# Patient Record
Sex: Female | Born: 1970 | Race: White | Hispanic: No | Marital: Married | Smoking: Current every day smoker
Health system: Southern US, Community
[De-identification: ages and names within clinical notes are randomized; demographics above are authoritative.]

## PROBLEM LIST (undated history)

## (undated) DIAGNOSIS — I10 Essential (primary) hypertension: Secondary | ICD-10-CM

## (undated) DIAGNOSIS — F988 Other specified behavioral and emotional disorders with onset usually occurring in childhood and adolescence: Secondary | ICD-10-CM

## (undated) DIAGNOSIS — E785 Hyperlipidemia, unspecified: Secondary | ICD-10-CM

## (undated) DIAGNOSIS — N289 Disorder of kidney and ureter, unspecified: Secondary | ICD-10-CM

## (undated) DIAGNOSIS — F419 Anxiety disorder, unspecified: Secondary | ICD-10-CM

## (undated) DIAGNOSIS — J302 Other seasonal allergic rhinitis: Secondary | ICD-10-CM

## (undated) DIAGNOSIS — J40 Bronchitis, not specified as acute or chronic: Secondary | ICD-10-CM

## (undated) DIAGNOSIS — N200 Calculus of kidney: Secondary | ICD-10-CM

## (undated) DIAGNOSIS — K760 Fatty (change of) liver, not elsewhere classified: Secondary | ICD-10-CM

## (undated) DIAGNOSIS — Z862 Personal history of diseases of the blood and blood-forming organs and certain disorders involving the immune mechanism: Secondary | ICD-10-CM

## (undated) DIAGNOSIS — R7303 Prediabetes: Secondary | ICD-10-CM

## (undated) DIAGNOSIS — F329 Major depressive disorder, single episode, unspecified: Secondary | ICD-10-CM

## (undated) HISTORY — DX: Other specified behavioral and emotional disorders with onset usually occurring in childhood and adolescence: F98.8

## (undated) HISTORY — DX: Other seasonal allergic rhinitis: J30.2

## (undated) HISTORY — DX: Hyperlipidemia, unspecified: E78.5

## (undated) HISTORY — DX: Prediabetes: R73.03

## (undated) HISTORY — DX: Major depressive disorder, single episode, unspecified: F32.9

## (undated) HISTORY — PX: NASAL SINUS SURGERY: SHX719

## (undated) HISTORY — PX: TUBAL LIGATION: SHX77

## (undated) HISTORY — PX: INGUINAL HERNIA REPAIR: SUR1180

## (undated) HISTORY — DX: Personal history of diseases of the blood and blood-forming organs and certain disorders involving the immune mechanism: Z86.2

## (undated) HISTORY — PX: APPENDECTOMY: SHX54

## (undated) HISTORY — PX: OTHER SURGICAL HISTORY: SHX169

---
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
## 2012-02-29 LAB — CBC AND DIFFERENTIAL
Hemoglobin: 15 g/dL (ref 12.0–16.0)
Platelets: 302 10*3/uL (ref 150–399)
WBC: 8.5 10^3/mL

## 2012-02-29 LAB — LIPID PANEL
Cholesterol: 167 mg/dL (ref 0–200)
LDL Cholesterol: 92 mg/dL
Triglycerides: 232 mg/dL — AB (ref 40–160)

## 2012-02-29 LAB — BASIC METABOLIC PANEL: Creatinine: 0.9 mg/dL (ref 0.5–1.1)

## 2012-06-14 ENCOUNTER — Ambulatory Visit (INDEPENDENT_AMBULATORY_CARE_PROVIDER_SITE_OTHER): Payer: 59 | Admitting: Family Medicine

## 2012-06-14 ENCOUNTER — Encounter: Payer: Self-pay | Admitting: Family Medicine

## 2012-06-14 VITALS — BP 132/75 | HR 82 | Ht 66.0 in | Wt 198.0 lb

## 2012-06-14 DIAGNOSIS — F988 Other specified behavioral and emotional disorders with onset usually occurring in childhood and adolescence: Secondary | ICD-10-CM

## 2012-06-14 DIAGNOSIS — F32A Depression, unspecified: Secondary | ICD-10-CM

## 2012-06-14 DIAGNOSIS — F329 Major depressive disorder, single episode, unspecified: Secondary | ICD-10-CM

## 2012-06-14 DIAGNOSIS — R7309 Other abnormal glucose: Secondary | ICD-10-CM

## 2012-06-14 DIAGNOSIS — R7303 Prediabetes: Secondary | ICD-10-CM | POA: Insufficient documentation

## 2012-06-14 DIAGNOSIS — J302 Other seasonal allergic rhinitis: Secondary | ICD-10-CM

## 2012-06-14 DIAGNOSIS — E785 Hyperlipidemia, unspecified: Secondary | ICD-10-CM | POA: Insufficient documentation

## 2012-06-14 DIAGNOSIS — Z862 Personal history of diseases of the blood and blood-forming organs and certain disorders involving the immune mechanism: Secondary | ICD-10-CM

## 2012-06-14 DIAGNOSIS — J309 Allergic rhinitis, unspecified: Secondary | ICD-10-CM

## 2012-06-14 HISTORY — DX: Other seasonal allergic rhinitis: J30.2

## 2012-06-14 HISTORY — DX: Hyperlipidemia, unspecified: E78.5

## 2012-06-14 HISTORY — DX: Personal history of diseases of the blood and blood-forming organs and certain disorders involving the immune mechanism: Z86.2

## 2012-06-14 HISTORY — DX: Other specified behavioral and emotional disorders with onset usually occurring in childhood and adolescence: F98.8

## 2012-06-14 HISTORY — DX: Depression, unspecified: F32.A

## 2012-06-14 MED ORDER — ESCITALOPRAM OXALATE 20 MG PO TABS: 20.0000 mg | ORAL_TABLET | Freq: Every day | ORAL | Status: DC

## 2012-06-14 MED ORDER — AMPHETAMINE-DEXTROAMPHETAMINE 10 MG PO TABS: 10.0000 mg | ORAL_TABLET | Freq: Two times a day (BID) | ORAL | Status: DC

## 2012-06-14 MED ORDER — CETIRIZINE HCL 10 MG PO TABS: 10.0000 mg | ORAL_TABLET | Freq: Every day | ORAL | Status: DC

## 2012-06-14 MED ORDER — ATORVASTATIN CALCIUM 40 MG PO TABS: 40.0000 mg | ORAL_TABLET | Freq: Every day | ORAL | Status: DC

## 2012-06-14 NOTE — Progress Notes (Signed)
CC: Sarah Nicholson is a 42 y.o. female is here for Establish Care, Medication Management and check A1c   Subjective: HPI:  Colonoscopy 2011 during leukocytosis episode.  Mammo normal 08/2011, papsmear normal 08/2011.  Pleasant 42 year old here to establish care  History hyperlipidemia: Requesting refills on Lipitor. States she's been on this for years and believes her cholesterol has been well controlled, last cholesterol check December 2013. Denies right upper quadrant pain, skin or scleral discoloration, myalgias, or limb claudication.  History prediabetes: Last summer an A1c was reportedly 6.1.  She admits to drinking large amounts of soda daily basis. Not much exercise since the winter.  Denies polyphagia polydipsia nor polyuria. Denies poorly healing wounds.  History of ADD: Reports being diagnosed by her psychiatrist in 36 while residing in [place].  Had been on Adderall 10 mg twice a day but stopped at the end of summer when college course work was over.  Has reenrolled with college and having trouble with attention while doing computer classes due to easy distractibility, trouble with task completion, with whom response abilities in school work. Requesting refills  History of depression: Reports being hospitalized for depression decades ago. Since then has been on Lexapro which great results but decided to take a drug holiday at the beginning of winter.  Since then she's slowly had a recurrence of periods of sadness and subjective depression with fleeting thoughts of wondering what would like to cut her wrists. She is adamant that she would never act on harming herself or others. No prior suicide attempts. She reports symptoms are improved with the support of her husband, worsened by frustrations with inability to complete course work.  Symptoms currently affecting quality of life.  Patient reports history of seasonal allergies: These were completely relieved when moving here from  [place] last summer. They're worsened with blooming trees locally over the past weeks. She is currently taking Zyrtec over-the-counter which helps, requesting formal prescription. Runny nose itchy eyes.  History of leukocytosis: She reports a white count of 32 and 2011. She reports a hematologist with a negative workup. Since then she denies swollen lymph nodes, trouble bleeding, night sweats, nor unintentional weight loss    Review Of Systems Outlined In HPI  Past Medical History  Diagnosis Date  . Prediabetes 06/14/2012  . Hyperlipidemia 06/14/2012  . Seasonal allergies 06/14/2012  . History of leukocytosis 06/14/2012    Reports "WBC 32" decades ago with negative hematology specialist workup.   . Depression 06/14/2012  . ADD (attention deficit disorder) 06/14/2012     Family History  Problem Relation Age of Onset  . Depression Mother   . Diabetes Father   . Hyperlipidemia Father   . Hypertension Father   . Depression Sister   . Depression Son   . Breast cancer Maternal Grandmother      History  Substance Use Topics  . Smoking status: Current Every Day Smoker  . Smokeless tobacco: Not on file  . Alcohol Use: Not on file     Objective: Filed Vitals:   06/14/12 1014  BP: 132/75  Pulse: 82    General: Alert and Oriented, No Acute Distress HEENT: Pupils equal, round, reactive to light. Conjunctivae clear.  External ears unremarkable, canals clear with intact TMs with appropriate landmarks.  Middle ear appears open without effusion. Pink inferior turbinates.  Moist mucous membranes, pharynx without inflammation nor lesions.  Neck supple without palpable lymphadenopathy nor abnormal masses. Lungs: Clear to auscultation bilaterally, no wheezing/ronchi/rales.  Comfortable work of breathing.  Good air movement. Cardiac: Regular rate and rhythm. Normal S1/S2.  No murmurs, rubs, nor gallops.   Abdomen: Obese soft nontender Extremities: No peripheral edema.  Strong peripheral pulses.  Mental  Status: No depression, anxiety, nor agitation. Pleasant and normal thought process Skin: Warm and dry.  Assessment & Plan: Chantille was seen today for establish care, medication management and check a1c.  Diagnoses and associated orders for this visit:  Prediabetes - POCT HgB A1C  Hyperlipidemia - atorvastatin (LIPITOR) 40 MG tablet; Take 1 tablet (40 mg total) by mouth daily.  ADD (attention deficit disorder) - amphetamine-dextroamphetamine (ADDERALL) 10 MG tablet; Take 1 tablet (10 mg total) by mouth 2 (two) times daily.  Depression - escitalopram (LEXAPRO) 20 MG tablet; Take 1 tablet (20 mg total) by mouth daily.  Seasonal allergies - cetirizine (ZYRTEC ALLERGY) 10 MG tablet; Take 1 tablet (10 mg total) by mouth daily.  History of leukocytosis  Other Orders - Discontinue: escitalopram (LEXAPRO) 20 MG tablet; Take 20 mg by mouth daily. - Discontinue: atorvastatin (LIPITOR) 40 MG tablet; Take 40 mg by mouth daily.    Prediabetes: A1c of 6.0 today, controlled, discussed lifestyle, diet, exercise interventions to help avoiding medications near future and health complications Hyperlipidemia: Clinically controlled, continue Lipitor, we'll hold off on lipid panel until outside records are reviewed Depression: Uncontrolled, restart Lexapro, contracted for safety, return 1 month sooner if needed Seasonal allergies: Control, continue Zyrtec, prescription provided History leukocytosis: No strong clinical indication for CBC today as it sounds like this was done back in December in for years old blood lines have been within normal limits per her report, will review outside records once available    Return in about 4 weeks (around 07/12/2012).

## 2012-06-27 ENCOUNTER — Encounter: Payer: Self-pay | Admitting: Family Medicine

## 2012-06-27 DIAGNOSIS — N6320 Unspecified lump in the left breast, unspecified quadrant: Secondary | ICD-10-CM | POA: Insufficient documentation

## 2012-06-27 DIAGNOSIS — F988 Other specified behavioral and emotional disorders with onset usually occurring in childhood and adolescence: Secondary | ICD-10-CM

## 2012-06-27 DIAGNOSIS — R3129 Other microscopic hematuria: Secondary | ICD-10-CM | POA: Insufficient documentation

## 2012-06-27 DIAGNOSIS — E785 Hyperlipidemia, unspecified: Secondary | ICD-10-CM

## 2012-06-28 ENCOUNTER — Encounter: Payer: Self-pay | Admitting: *Deleted

## 2012-07-12 ENCOUNTER — Ambulatory Visit: Payer: 59 | Admitting: Family Medicine

## 2012-07-12 DIAGNOSIS — Z0289 Encounter for other administrative examinations: Secondary | ICD-10-CM

## 2012-08-23 ENCOUNTER — Encounter: Payer: Self-pay | Admitting: Family Medicine

## 2012-08-23 ENCOUNTER — Ambulatory Visit (INDEPENDENT_AMBULATORY_CARE_PROVIDER_SITE_OTHER): Payer: 59 | Admitting: Family Medicine

## 2012-08-23 VITALS — BP 116/80 | HR 72 | Wt 196.0 lb

## 2012-08-23 DIAGNOSIS — F988 Other specified behavioral and emotional disorders with onset usually occurring in childhood and adolescence: Secondary | ICD-10-CM

## 2012-08-23 DIAGNOSIS — R1012 Left upper quadrant pain: Secondary | ICD-10-CM

## 2012-08-23 DIAGNOSIS — F329 Major depressive disorder, single episode, unspecified: Secondary | ICD-10-CM

## 2012-08-23 DIAGNOSIS — N63 Unspecified lump in unspecified breast: Secondary | ICD-10-CM

## 2012-08-23 DIAGNOSIS — N6320 Unspecified lump in the left breast, unspecified quadrant: Secondary | ICD-10-CM

## 2012-08-23 LAB — CBC WITH DIFFERENTIAL/PLATELET
Hemoglobin: 14.2 g/dL (ref 12.0–15.0)
Lymphs Abs: 3 10*3/uL (ref 0.7–4.0)
Monocytes Relative: 7 % (ref 3–12)
Neutro Abs: 7.5 10*3/uL (ref 1.7–7.7)
Neutrophils Relative %: 65 % (ref 43–77)
Platelets: 325 10*3/uL (ref 150–400)
RBC: 4.87 MIL/uL (ref 3.87–5.11)
WBC: 11.6 10*3/uL — ABNORMAL HIGH (ref 4.0–10.5)

## 2012-08-23 MED ORDER — SACCHAROMYCES BOULARDII 250 MG PO CAPS: 250.0000 mg | ORAL_CAPSULE | Freq: Two times a day (BID) | ORAL | Status: DC

## 2012-08-23 MED ORDER — AMPHETAMINE-DEXTROAMPHETAMINE 10 MG PO TABS: 10.0000 mg | ORAL_TABLET | Freq: Two times a day (BID) | ORAL | Status: DC

## 2012-08-23 NOTE — Progress Notes (Signed)
CC: Sarah Nicholson is a 42 y.o. female is here for ADHD and GI Problem   Subjective: HPI:  Followup ADHD: Patient has been taking Adderall 10 mg one to 2 times a day for the past month noting significant improvement in completion of course work, computer studies. She denies insomnia, irregular heartbeat, chest pain, paranoia, anxiety, unintentional weight loss. She is Quite happy with her current symptom control  Followup depression: She restarted Lexapro at her last visit. She's quite happy with her response. She notes great improvement with thoughts of negativity no longer being present. She denies unintentional weight loss or gain, trouble sleeping, loss in all hobbies, relationship issues, nor thoughts of wanting to harm herself  One year ago she had a mammogram showing a benign mass and was recommended to continue with annual screening she is yet to have this followup mammogram yet she currently denies any rest architectural changes, nipple discharge, nor palpable lumps  She complains today of 2 weeks of left upper quadrant pain it feels similar to when she had a leukocytosis years ago. In the past it was significantly improved with probiotics, she is requesting a prescription of this today.. she denies early satiety, appetite loss, nausea, vomiting. Pain is described as a burning with belching mild in severity nonradiating improves with belching, nothing else makes better or worse. does not seem to be influenced by type of food or meal times. Denies fevers, chills, swollen lymph nodes, easy filling diarrhea nor constipation     Review Of Systems Outlined In HPI  Past Medical History  Diagnosis Date  . Prediabetes 06/14/2012  . Hyperlipidemia 06/14/2012  . Seasonal allergies 06/14/2012  . History of leukocytosis 06/14/2012    Reports "WBC 32" decades ago with negative hematology specialist workup.   . Depression 06/14/2012  . ADD (attention deficit disorder) 06/14/2012     Family History   Problem Relation Age of Onset  . Depression Mother   . Diabetes Father   . Hyperlipidemia Father   . Hypertension Father   . Depression Sister   . Depression Son   . Breast cancer Maternal Grandmother      History  Substance Use Topics  . Smoking status: Current Every Day Smoker  . Smokeless tobacco: Not on file  . Alcohol Use: Not on file     Objective: Filed Vitals:   08/23/12 0924  BP: 116/80  Pulse: 72    General: Alert and Oriented, No Acute Distress HEENT: Pupils equal, round, reactive to light. Conjunctivae clear.  External ears unremarkable, canals clear with intact TMs with appropriate landmarks.  Middle ear appears open without effusion. Pink inferior turbinates.  Moist mucous membranes, pharynx without inflammation nor lesions.  Neck supple without palpable lymphadenopathy nor abnormal masses. Lungs: Clear to auscultation bilaterally, no wheezing/ronchi/rales.  Comfortable work of breathing. Good air movement. Cardiac: Regular rate and rhythm. Normal S1/S2.  No murmurs, rubs, nor gallops.   Abdomen:  obese soft nontender to palpation with normal bowel sounds no palpable masses, no palpable splenomegaly  Extremities: No peripheral edema.  Strong peripheral pulses.  Mental Status: No depression, anxiety, nor agitation. Skin: Warm and dry.  Assessment & Plan: Sarah Nicholson was seen today for adhd and gi problem.  Diagnoses and associated orders for this visit:  ADD (attention deficit disorder) - Discontinue: amphetamine-dextroamphetamine (ADDERALL) 10 MG tablet; Take 1 tablet (10 mg total) by mouth 2 (two) times daily. - Discontinue: amphetamine-dextroamphetamine (ADDERALL) 10 MG tablet; Take 1 tablet (10 mg total) by mouth  2 (two) times daily. May fill on/after July 12th 2014 - amphetamine-dextroamphetamine (ADDERALL) 10 MG tablet; Take 1 tablet (10 mg total) by mouth 2 (two) times daily. May fill on/after August 12th 2014  Left breast mass - MM Digital Screening;  Future  LUQ pain - CBC with Differential - H. pylori antibody, IgG - saccharomyces boulardii (FLORASTOR) 250 MG capsule; Take 1 capsule (250 mg total) by mouth 2 (two) times daily.  Depression    ADD: Chronic well controlled condition, continue as needed Adderall Depression: Chronic well controlled condition, continue Lexapro Left breast mass: Due for routine screening mammogram placed today Left upper quadrant pain: Checking CBC for surveillance of leukocytosis in the past, rule out H. Pylori she's never been treated for this in the past, start florastor given significant benefit of similar pain in the past  Return in about 3 months (around 11/23/2012). or sooner if symptoms persist greater than one week

## 2012-08-24 LAB — H. PYLORI ANTIBODY, IGG: H Pylori IgG: <0.4 {ISR}

## 2012-09-10 ENCOUNTER — Ambulatory Visit (INDEPENDENT_AMBULATORY_CARE_PROVIDER_SITE_OTHER): Payer: 59 | Admitting: Family Medicine

## 2012-09-10 ENCOUNTER — Encounter: Payer: Self-pay | Admitting: Family Medicine

## 2012-09-10 VITALS — BP 137/88 | HR 72 | Temp 98.1°F | Wt 199.0 lb

## 2012-09-10 DIAGNOSIS — R300 Dysuria: Secondary | ICD-10-CM

## 2012-09-10 LAB — POCT URINALYSIS DIPSTICK
Protein, UA: NEGATIVE
Spec Grav, UA: <=1.005
Urobilinogen, UA: 0.2

## 2012-09-10 MED ORDER — CIPROFLOXACIN HCL 500 MG PO TABS: ORAL_TABLET | ORAL | Status: AC

## 2012-09-10 NOTE — Progress Notes (Signed)
CC: Sarah Nicholson is a 42 y.o. female is here for Dysuria   Subjective: HPI:  Patient complains of 3 days of dysuria described as moderate in severity, has been worsening gradually since onset on Friday. Over the week and associated with fevers and chills. Worse with urinating, slightly improved with increased hydration nothing else makes better or worse. Over the week and has been accompanied by mild low back pain nonradiating nothing makes better or worse other than slight improvement with ibuprofen. Dysuria is localized in the pelvis nonradiating with a burning sensation. She denies vaginal discharge, current menses, nor genitourinary complaints otherwise. Denies nausea, vomiting, abdominal pain, nor chest pain   Review Of Systems Outlined In HPI  Past Medical History  Diagnosis Date  . Prediabetes 06/14/2012  . Hyperlipidemia 06/14/2012  . Seasonal allergies 06/14/2012  . History of leukocytosis 06/14/2012    Reports "WBC 32" decades ago with negative hematology specialist workup.   . Depression 06/14/2012  . ADD (attention deficit disorder) 06/14/2012     Family History  Problem Relation Age of Onset  . Depression Mother   . Diabetes Father   . Hyperlipidemia Father   . Hypertension Father   . Depression Sister   . Depression Son   . Breast cancer Maternal Grandmother      History  Substance Use Topics  . Smoking status: Current Every Day Smoker  . Smokeless tobacco: Not on file  . Alcohol Use: Not on file     Objective: Filed Vitals:   09/10/12 0933  BP: 137/88  Pulse: 72  Temp: 98.1 F (36.7 C)    Vital signs reviewed. General: Alert and Oriented, No Acute Distress HEENT: Pupils equal, round, reactive to light. Conjunctivae clear.  External ears unremarkable.  Moist mucous membranes. Back: Trace left CVA tenderness. Lungs: Clear and comfortable work of breathing, speaking in full sentences without accessory muscle use. Cardiac: Regular rate and rhythm.  Neuro:  CN II-XII grossly intact, gait normal. Extremities: No peripheral edema.  Strong peripheral pulses.  Mental Status: No depression, anxiety, nor agitation. Logical though process. Skin: Warm and dry.  Assessment & Plan: Sarah Nicholson was seen today for dysuria.  Diagnoses and associated orders for this visit:  Dysuria - Urinalysis Dipstick - Urine Culture - ciprofloxacin (CIPRO) 500 MG tablet; Take one by mouth twice a day for five days.    Urinalysis with blood, this has been found in UA in the past however given history of pyelonephritis and symptoms consistent with UTI will start treatment with high-dose Cipro following culture and I've asked her to call before we closed tomorrow if symptoms are worsening.  Return if symptoms worsen or fail to improve.

## 2012-11-27 ENCOUNTER — Encounter: Payer: Self-pay | Admitting: *Deleted

## 2013-01-21 ENCOUNTER — Encounter: Payer: Self-pay | Admitting: Physician Assistant

## 2013-01-21 ENCOUNTER — Ambulatory Visit (INDEPENDENT_AMBULATORY_CARE_PROVIDER_SITE_OTHER): Payer: 59

## 2013-01-21 ENCOUNTER — Ambulatory Visit (INDEPENDENT_AMBULATORY_CARE_PROVIDER_SITE_OTHER): Payer: 59 | Admitting: Physician Assistant

## 2013-01-21 VITALS — BP 127/77 | HR 69 | Wt 201.0 lb

## 2013-01-21 DIAGNOSIS — W540XXA Bitten by dog, initial encounter: Secondary | ICD-10-CM

## 2013-01-21 DIAGNOSIS — S61409A Unspecified open wound of unspecified hand, initial encounter: Secondary | ICD-10-CM

## 2013-01-21 DIAGNOSIS — Z23 Encounter for immunization: Secondary | ICD-10-CM

## 2013-01-21 DIAGNOSIS — T148XXA Other injury of unspecified body region, initial encounter: Secondary | ICD-10-CM

## 2013-01-21 MED ORDER — TRAMADOL HCL 50 MG PO TABS: 50.0000 mg | ORAL_TABLET | Freq: Four times a day (QID) | ORAL | Status: DC | PRN

## 2013-01-21 MED ORDER — AMOXICILLIN-POT CLAVULANATE 875-125 MG PO TABS: 1.0000 | ORAL_TABLET | Freq: Two times a day (BID) | ORAL | Status: DC

## 2013-01-21 NOTE — Progress Notes (Signed)
  Subjective:    Patient ID: Sarah Nicholson, female    DOB: 1970-03-17, 42 y.o.   MRN: [id number]  HPI Patient presents to the clinic with a dog bite to the left hand this morning. 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 She ran cold water over wound and wrapped up and came to doctors office. Nothing else has been tried. Dog is up to date of rabies and all other vaccines. Pt has not had tetanus since 42 years old. She had a lot of swelling in her arm and was told not to get tetanus again. She does tolerate the flu shot. She is having a lot of pain and her index finger is numb.     Review of Systems     Objective:   Physical Exam  Constitutional: She appears well-developed and well-nourished.  Musculoskeletal:  Left hand: ROM decreased due to pain. Hand grip decreased significantly. Numbness throughout index finger. Pain with palpation over 1st Metatarsal and down into dorsum side of hand to the base of thumb.  Skin:  Left hand: 5 superfical puncture wounds. Generally swollen and warm to touch.   2 puncture wounds on left index finger under the MCP on palmar side and dorsum side in between MCP and PCP.  1 puncture wound at base of MCP of 2nd metatarsal dorsum side. 1 puncture wound dorsum side in between MCP and PCP and 1 puncture wound palmar side on 2nd metatarsal in between MCP and PCP.          Assessment & Plan:  Dog bite/puncture wound- using normal saline wounds were irrigated. bactroban placed on 5 puncture wounds. Gauze were placed over bite and compressed with ace bandage.   Upon discussion realized that thimerosal could be the preservative that pt was allergic too. Looked through tdap ingredients and no thiomersal. Tdap was given today pt waited in office 30 minutes for any immediate reactions. No reactions noted. She can take benadryl as preventative.    Pt encouraged to keep wounds clean with  soap and water, bactroban for next 3 days. Encouraged ice and regular ibuprofen for next couple of days. Augmentin sent for 7 days. Xrays gotten for puncture wound and for baseline. Follow up in 7 days if not improving. Index finger numbness could be from swelling f/u if not improving.    Spent 30 minutes with patient and greater than 50 percent of visit spent discussing risk vs benefit of Tdap and wound care.

## 2013-01-21 NOTE — Patient Instructions (Signed)

## 2013-03-11 ENCOUNTER — Emergency Department: Admission: EM | Admit: 2013-03-11 | Discharge: 2013-03-11 | Payer: 59 | Source: Home / Self Care

## 2013-05-24 ENCOUNTER — Encounter: Payer: Self-pay | Admitting: Emergency Medicine

## 2013-05-24 ENCOUNTER — Emergency Department (INDEPENDENT_AMBULATORY_CARE_PROVIDER_SITE_OTHER)
Admission: EM | Admit: 2013-05-24 | Discharge: 2013-05-24 | Disposition: A | Discharge status: Home / Self Care | Payer: 59 | Source: Home / Self Care | Attending: Emergency Medicine | Admitting: Emergency Medicine

## 2013-05-24 DIAGNOSIS — T7840XA Allergy, unspecified, initial encounter: Secondary | ICD-10-CM

## 2013-05-24 MED ORDER — EPINEPHRINE 0.3 MG/0.3ML IJ SOAJ: 0.3000 mg | INTRAMUSCULAR | Status: DC | PRN

## 2013-05-24 MED ORDER — PREDNISONE (PAK) 10 MG PO TABS: ORAL_TABLET | ORAL | Status: DC

## 2013-05-24 MED ORDER — DIPHENHYDRAMINE HCL 50 MG/ML IJ SOLN
25.0000 mg | Freq: Once | INTRAMUSCULAR | Status: AC
  Administered 2013-05-24: 25 mg via INTRAMUSCULAR

## 2013-05-24 MED ORDER — METHYLPREDNISOLONE SODIUM SUCC 125 MG IJ SOLR
125.0000 mg | INTRAMUSCULAR | Status: AC
  Administered 2013-05-24: 125 mg via INTRAMUSCULAR

## 2013-05-24 NOTE — ED Notes (Signed)
Pt c/o SOB, swelling in her hands, and itching x 1515. She reports that she has had reaction in the past to cleaning products and that they waxed the floors at her work last night.

## 2013-05-24 NOTE — ED Provider Notes (Signed)
CSN: [id number]     Arrival date & time 05/24/13  1618 History   First MD Initiated Contact with Patient 05/24/13 1618     Chief Complaint  Patient presents with  . Shortness of Breath  . Pruritis   Pt c/o SOB, swelling in her hands, and itching x 1515. She reports that she has had reaction in the past to cleaning products and that they waxed the floors at her work last night.   Patient is a 43 y.o. female presenting with allergic reaction. The history is provided by the patient.  Allergic Reaction Presenting symptoms: difficulty breathing, difficulty swallowing, itching and rash   Presenting symptoms: no swelling and no wheezing   Severity:  Severe Prior episodes: When exposed to the smell of recently waxed floors. Context: chemicals (Smell of recently waxed floor)   Context: no cosmetics, no dairy/milk products, no eggs, no food allergies, no grass, no insect bite/sting, no medications, no new detergents/soaps and no poison ivy   Context comment:  Smell of recently waxed floor Relieved by:  Nothing Worsened by:  Nothing tried Ineffective treatments: 1 Zyrtec today.   Associated sxs:+ fatigue, mild lightheaded.  No syncope or focal neuro sxs. No nausea, vomiting, chest pain.   Past Medical History  Diagnosis Date  . Prediabetes 06/14/2012  . Hyperlipidemia 06/14/2012  . Seasonal allergies 06/14/2012  . History of leukocytosis 06/14/2012    Reports "WBC 32" decades ago with negative hematology specialist workup.   . Depression 06/14/2012  . ADD (attention deficit disorder) 06/14/2012   Past Surgical History  Procedure Laterality Date  . Appendectomy    . Tubal ligation    . Intrauterine ablation    . Bladder sling/mesh    . Nasal sinus surgery     Family History  Problem Relation Age of Onset  . Depression Mother   . Diabetes Father   . Hyperlipidemia Father   . Hypertension Father   . Depression Sister   . Depression Son   . Breast cancer Maternal Grandmother    History   Substance Use Topics  . Smoking status: Current Every Day Smoker  . Smokeless tobacco: Not on file  . Alcohol Use: Not on file   OB History   Grav Para Term Preterm Abortions TAB SAB Ect Mult Living                 Review of Systems  HENT: Positive for trouble swallowing.   Respiratory: Negative for wheezing.   Cardiovascular: Negative for chest pain and palpitations.  Gastrointestinal: Negative.  Negative for nausea and vomiting.  Skin: Positive for itching and rash.  Neurological: Negative for seizures, syncope and numbness.  All other systems reviewed and are negative.    Allergies  Macrobid and Tetanus toxoids  Home Medications   Current Outpatient Rx  Name  Route  Sig  Dispense  Refill  . amoxicillin-clavulanate (AUGMENTIN) 875-125 MG per tablet   Oral   Take 1 tablet by mouth 2 (two) times daily. For 7 days.   14 tablet   0   . amphetamine-dextroamphetamine (ADDERALL) 10 MG tablet   Oral   Take 1 tablet (10 mg total) by mouth 2 (two) times daily. May fill on/after August 12th 2014   60 tablet   0   . atorvastatin (LIPITOR) 40 MG tablet   Oral   Take 1 tablet (40 mg total) by mouth daily.   90 tablet   1   . cetirizine (ZYRTEC ALLERGY)  10 MG tablet   Oral   Take 1 tablet (10 mg total) by mouth daily.   90 tablet   1   . EPINEPHrine (EPIPEN) 0.3 mg/0.3 mL SOAJ injection   Intramuscular   Inject 0.3 mLs (0.3 mg total) into the muscle as needed. As instructed if needed for severe allergic reaction   1 Device   0   . escitalopram (LEXAPRO) 20 MG tablet   Oral   Take 1 tablet (20 mg total) by mouth daily.   90 tablet   1   . predniSONE (STERAPRED UNI-PAK) 10 MG tablet      Take as directed for 6 days.--Take 6 on day 1, 5 on day 2, 4 on day 3, then 3 tablets on day 4, then 2 tablets on day 5, then 1 on day 6.   21 tablet   0   . traMADol (ULTRAM) 50 MG tablet   Oral   Take 1 tablet (50 mg total) by mouth every 6 (six) hours as needed.   20  tablet   0    BP 146/63  Pulse 90  Temp(Src) 98.7 F (37.1 C) (Oral)  Resp 20  SpO2 98% Physical Exam  Constitutional: She is oriented to person, place, and time. She appears well-developed and well-nourished. She appears distressed (Dyspneic, appears very uncomfortable).  Hives  HENT:  Head: Normocephalic and atraumatic.  Nose: Nose normal.  Mouth/Throat: Oropharynx is clear and moist and mucous membranes are normal. No oral lesions. No oropharyngeal exudate or posterior oropharyngeal edema.  Airway intact. No lip swelling  Eyes: Pupils are equal, round, and reactive to light. No scleral icterus.  Neck: Normal range of motion. Neck supple. No JVD present. No tracheal deviation present.  Cardiovascular: Normal rate, regular rhythm and normal heart sounds.  Exam reveals no gallop and no friction rub.   No murmur heard. Pulmonary/Chest: No stridor. She is in respiratory distress. She has wheezes (minimal late expiratory wheezes). She has no rales.  Abdominal: Soft. She exhibits no distension. There is no tenderness.  Musculoskeletal: Normal range of motion. She exhibits no edema and no tenderness.  Lymphadenopathy:    She has no cervical adenopathy.  Neurological: She is alert and oriented to person, place, and time. She displays normal reflexes. No cranial nerve deficit. She exhibits normal muscle tone.  Skin: Skin is warm. She is diaphoretic.  Blanching hives diffusely, especially anterior chest, neck,face  Psychiatric: She has a normal mood and affect. Her behavior is normal.  Mildly anxious    ED Course  Procedures (including critical care time) Labs Review Labs Reviewed - No data to display Imaging Review No results found.  4:20 PM-patient was immediately brought back to exam room and evaluated by physician and nurse. Benadryl 25 mg IM ordered stat  MDM   1. Allergic reaction    At 4:40 PM, I reevaluated patient. She felt significantly better after the Benadryl 25  mg shot. No dyspnea. The rash on chest and neck and face with significantly decreased.The itching almost completely resolved. Lungs: clear , no wheezes. Heart regular rate and rhythm without murmur. Vital signs stable.--Overall, she was significantly improved.  Treatment options discussed, as well as risks, benefits, alternatives. Patient voiced understanding and agreement with the following plans: SoluMedrol 125 mg IM stat. She was observed for another 30 min. And she continued to improved. Vital signs remained stable.  Prescribed prednisone 10 mg-six day dose pack. EpiPen to keep on hand, with instructions and precautions.  At home, take Benadryl 25 mg Q6 hours. Also, Zyrtec daily. Other symptomatic care discussed. Follow-up with your primary care doctor in 5 days, sooner if needed. Advised to discuss with PCP the option of referral to an allergist for full allergy testing. In the meantime, avoid being exposed to smells after recently waxed floors. Note given to excuse from work today and tomorrow.  Precautions discussed. Red flags discussed.--Go to ER if any red flags. Questions invited and answered. Patient voiced understanding and agreement.     Jacqulyn Cane, MD 05/24/13 1901

## 2013-06-25 ENCOUNTER — Ambulatory Visit (INDEPENDENT_AMBULATORY_CARE_PROVIDER_SITE_OTHER): Payer: 59 | Admitting: Family Medicine

## 2013-06-25 ENCOUNTER — Encounter: Payer: Self-pay | Admitting: Family Medicine

## 2013-06-25 VITALS — BP 116/73 | HR 84 | Temp 98.5°F | Wt 205.0 lb

## 2013-06-25 DIAGNOSIS — F329 Major depressive disorder, single episode, unspecified: Secondary | ICD-10-CM

## 2013-06-25 DIAGNOSIS — F3289 Other specified depressive episodes: Secondary | ICD-10-CM

## 2013-06-25 DIAGNOSIS — M7712 Lateral epicondylitis, left elbow: Secondary | ICD-10-CM | POA: Insufficient documentation

## 2013-06-25 DIAGNOSIS — N83209 Unspecified ovarian cyst, unspecified side: Secondary | ICD-10-CM | POA: Insufficient documentation

## 2013-06-25 DIAGNOSIS — F32A Depression, unspecified: Secondary | ICD-10-CM

## 2013-06-25 DIAGNOSIS — Z9109 Other allergy status, other than to drugs and biological substances: Secondary | ICD-10-CM

## 2013-06-25 DIAGNOSIS — E785 Hyperlipidemia, unspecified: Secondary | ICD-10-CM

## 2013-06-25 DIAGNOSIS — M7710 Lateral epicondylitis, unspecified elbow: Secondary | ICD-10-CM

## 2013-06-25 DIAGNOSIS — F988 Other specified behavioral and emotional disorders with onset usually occurring in childhood and adolescence: Secondary | ICD-10-CM

## 2013-06-25 MED ORDER — ESCITALOPRAM OXALATE 20 MG PO TABS: 20.0000 mg | ORAL_TABLET | Freq: Every day | ORAL | Status: DC

## 2013-06-25 MED ORDER — AMPHETAMINE-DEXTROAMPHETAMINE 10 MG PO TABS: 10.0000 mg | ORAL_TABLET | Freq: Two times a day (BID) | ORAL | Status: DC

## 2013-06-25 MED ORDER — ATORVASTATIN CALCIUM 40 MG PO TABS: 40.0000 mg | ORAL_TABLET | Freq: Every day | ORAL | Status: DC

## 2013-06-25 NOTE — Progress Notes (Signed)
CC: Sarah Nicholson is a 43 y.o. female is here for left elbow pain and wants to get back on medication   Subjective: HPI:  Followup hyperlipidemia: Stop taking Lipitor months ago for no particular reason. Denies any intolerance. Denies right upper quadrant pain myalgias nor any known side effects.  Followup depression: Stop taking Lexapro about a month ago stopped without a taper. Since then she reports a she's had lack of motivation with academics. Denies thoughts when harm herself or others however when trying to fall sleep she does think of worst case scenarios.  She has little to look forward to other than birth of her grandson coming up in October. Review of systems positive for unintentional weight gain. She stopped taking medication to see if she still really needed to be on.  Followup ADD: Has not taken Adderall since being at 2015. Denies any intolerance. She's had lack of motivation to come back to request a refill. She's having huge difficulties with completing course work at home. Reports moderate to severe easy distractibility since stopping Adderall. Denies any anxiety, sleep disturbance, nor any other intolerance when taking the medication.  She requested referral to an allergist for allergy testing.  Complains of left elbow pain it has been present since December 2014. Presently daily basis mild to moderate in severity. Nothing particularly better, slightly improves with ibuprofen. Denies swelling, redness, warmth or radiation of pain. Denies recent or remote trauma or overexertion   Review Of Systems Outlined In HPI  Past Medical History  Diagnosis Date  . Prediabetes 06/14/2012  . Hyperlipidemia 06/14/2012  . Seasonal allergies 06/14/2012  . History of leukocytosis 06/14/2012    Reports "WBC 32" decades ago with negative hematology specialist workup.   . Depression 06/14/2012  . ADD (attention deficit disorder) 06/14/2012    Past Surgical History  Procedure Laterality Date   . Appendectomy    . Tubal ligation    . Intrauterine ablation    . Bladder sling/mesh    . Nasal sinus surgery     Family History  Problem Relation Age of Onset  . Depression Mother   . Diabetes Father   . Hyperlipidemia Father   . Hypertension Father   . Depression Sister   . Depression Son   . Breast cancer Maternal Grandmother     History   Social History  . Marital Status: Married    Spouse Name: N/A    Number of Children: N/A  . Years of Education: N/A   Occupational History  . Not on file.   Social History Main Topics  . Smoking status: Current Every Day Smoker  . Smokeless tobacco: Not on file  . Alcohol Use: Not on file  . Drug Use: Not on file  . Sexual Activity: Not on file   Other Topics Concern  . Not on file   Social History Narrative  . No narrative on file     Objective: BP 116/73  Pulse 84  Temp(Src) 98.5 F (36.9 C) (Oral)  Wt 205 lb (92.987 kg)  General: Alert and Oriented, No Acute Distress HEENT: Pupils equal, round, reactive to light. Conjunctivae clear.  Moist mucous membranes pharynx unremarkable Lungs: Clear to auscultation bilaterally, no wheezing/ronchi/rales.  Comfortable work of breathing. Good air movement. Cardiac: Regular rate and rhythm. Normal S1/S2.  No murmurs, rubs, nor gallops.   Extremities: No peripheral edema.  Strong peripheral pulses.  She has full range of motion strength in the left upper extremity, pain is not reproduced  with resisted elbow flexion or extension. Pain is reproduced with resisted left wrist extension and with left wrist supination, pain is mildly reproduced with palpation of left epicondyle. No pain with medial epicondyle or coracoid palpation. No overlying redness warmth or swelling in the left elbow Mental Status: No depression, anxiety, nor agitation. Skin: Warm and dry.  Assessment & Plan: Sarah Nicholson was seen today for left elbow pain and wants to get back on medication.  Diagnoses and associated  orders for this visit:  ADD (attention deficit disorder) - amphetamine-dextroamphetamine (ADDERALL) 10 MG tablet; Take 1 tablet (10 mg total) by mouth 2 (two) times daily.  Depression - escitalopram (LEXAPRO) 20 MG tablet; Take 1 tablet (20 mg total) by mouth daily.  Hyperlipidemia - atorvastatin (LIPITOR) 40 MG tablet; Take 1 tablet (40 mg total) by mouth daily.  Multiple environmental allergies - Ambulatory referral to Allergy  Left tennis elbow    ADD: Uncontrolled restart Adderall Depression: Uncontrolled restart Lexapro Hyperlipidemia: Restart Lipitor with recheck of lipid panel in 2-3 months. Left tennis elbow: Discuss continuation of nonsteroidal anti-inflammatories, ibuprofen 800 mg 3 times a day as needed for pain. She was given a handout on home stretching and rehabilitation exercises she can do on a daily basis the next 2-3 weeks. If not improving after this time we will refer to Dr. Darene Lamer. in sports medicine for further evaluation and treatment.  Return in about 3 months (around 09/24/2013).

## 2013-08-26 ENCOUNTER — Encounter: Payer: Self-pay | Admitting: Family Medicine

## 2013-08-26 ENCOUNTER — Ambulatory Visit (INDEPENDENT_AMBULATORY_CARE_PROVIDER_SITE_OTHER): Payer: 59 | Admitting: Family Medicine

## 2013-08-26 VITALS — BP 124/88 | HR 29 | Ht 66.0 in | Wt 200.0 lb

## 2013-08-26 DIAGNOSIS — Z1239 Encounter for other screening for malignant neoplasm of breast: Secondary | ICD-10-CM

## 2013-08-26 DIAGNOSIS — Z5181 Encounter for therapeutic drug level monitoring: Secondary | ICD-10-CM

## 2013-08-26 DIAGNOSIS — Z0000 Encounter for general adult medical examination without abnormal findings: Secondary | ICD-10-CM

## 2013-08-26 DIAGNOSIS — R7309 Other abnormal glucose: Secondary | ICD-10-CM

## 2013-08-26 DIAGNOSIS — Z79899 Other long term (current) drug therapy: Secondary | ICD-10-CM

## 2013-08-26 DIAGNOSIS — M2550 Pain in unspecified joint: Secondary | ICD-10-CM

## 2013-08-26 DIAGNOSIS — R7303 Prediabetes: Secondary | ICD-10-CM

## 2013-08-26 DIAGNOSIS — E785 Hyperlipidemia, unspecified: Secondary | ICD-10-CM

## 2013-08-26 LAB — COMPLETE METABOLIC PANEL WITH GFR
ALK PHOS: 66 U/L (ref 39–117)
ALT: 9 U/L (ref 0–35)
AST: 11 U/L (ref 0–37)
Albumin: 3.9 g/dL (ref 3.5–5.2)
BUN: 12 mg/dL (ref 6–23)
CALCIUM: 9 mg/dL (ref 8.4–10.5)
CO2: 25 mEq/L (ref 19–32)
Chloride: 108 mEq/L (ref 96–112)
Creat: 0.89 mg/dL (ref 0.50–1.10)
GFR, EST NON AFRICAN AMERICAN: 79 mL/min
GFR, Est African American: >89 mL/min
Glucose, Bld: 82 mg/dL (ref 70–99)
POTASSIUM: 4 meq/L (ref 3.5–5.3)
SODIUM: 141 meq/L (ref 135–145)
TOTAL PROTEIN: 6.4 g/dL (ref 6.0–8.3)
Total Bilirubin: 0.3 mg/dL (ref 0.2–1.2)

## 2013-08-26 LAB — LIPID PANEL
CHOL/HDL RATIO: 6.1 ratio
CHOLESTEROL: 195 mg/dL (ref 0–200)
HDL: 32 mg/dL — ABNORMAL LOW (ref >39)
LDL Cholesterol: 120 mg/dL — ABNORMAL HIGH (ref 0–99)
Triglycerides: 215 mg/dL — ABNORMAL HIGH (ref <150)
VLDL: 43 mg/dL — ABNORMAL HIGH (ref 0–40)

## 2013-08-26 LAB — HEMOGLOBIN A1C
HEMOGLOBIN A1C: 6 % — AB (ref <5.7)
Mean Plasma Glucose: 126 mg/dL — ABNORMAL HIGH (ref <117)

## 2013-08-26 NOTE — Progress Notes (Signed)
CC: Sarah Nicholson is a 43 y.o. female is here for Annual Exam   Subjective: HPI:  Colonoscopy:  Done 2013 and normal, for leukocytosis Papsmear: She had this done last year and it was normal with her GYN office no history of abnormals Mammogram: 2013 benign mass in left breast recs routine screening. Family history of breast cancer in her grandmother and her grandmother siblings. We discussed risks and benefits of screening in her age group and she understandably would like to have a screening mammogram  Influenza Vaccine: No current indication Pneumovax: She believes she had this back in 2012 with her history of smoking, up-to-date Td/Tdap:  Tdap 2014 Zoster: (Start 45 yo)  States that her mood is much better since restarting Lexapro. Mother was recently diagnosed with mixed connective tissue disease.  The patient states that she has chronic joint pain off and on affecting her hands and shoulders that is mild in severity and painful to a degree where she has not addressed it with physicians in the past.  Review of Systems - General ROS: negative for - chills, fever, night sweats, weight gain or weight loss Ophthalmic ROS: negative for - decreased vision Psychological ROS: negative for - anxiety or depression ENT ROS: negative for - hearing change, nasal congestion, tinnitus or allergies Hematological and Lymphatic ROS: negative for - bleeding problems, bruising or swollen lymph nodes Breast ROS: negative Respiratory ROS: no cough, shortness of breath, or wheezing Cardiovascular ROS: no chest pain or dyspnea on exertion Gastrointestinal ROS: no abdominal pain, change in bowel habits, or black or bloody stools Genito-Urinary ROS: negative for - genital discharge, genital ulcers, incontinence or abnormal bleeding from genitals Musculoskeletal ROS: negative for - joint pain or muscle pain Neurological ROS: negative for - headaches or memory loss Dermatological ROS: negative for lumps,  mole changes, rash and skin lesion changes  Past Medical History  Diagnosis Date  . Prediabetes 06/14/2012  . Hyperlipidemia 06/14/2012  . Seasonal allergies 06/14/2012  . History of leukocytosis 06/14/2012    Reports "WBC 32" decades ago with negative hematology specialist workup.   . Depression 06/14/2012  . ADD (attention deficit disorder) 06/14/2012    Past Surgical History  Procedure Laterality Date  . Appendectomy    . Tubal ligation    . Intrauterine ablation    . Bladder sling/mesh    . Nasal sinus surgery     Family History  Problem Relation Age of Onset  . Depression Mother   . Diabetes Father   . Hyperlipidemia Father   . Hypertension Father   . Depression Sister   . Depression Son   . Breast cancer Maternal Grandmother     History   Social History  . Marital Status: Married    Spouse Name: N/A    Number of Children: N/A  . Years of Education: N/A   Occupational History  . Not on file.   Social History Main Topics  . Smoking status: Current Every Day Smoker  . Smokeless tobacco: Not on file  . Alcohol Use: Not on file  . Drug Use: Not on file  . Sexual Activity: Not on file   Other Topics Concern  . Not on file   Social History Narrative  . No narrative on file     Objective: BP 124/88  Pulse 29  Ht 5\' 6"  (1.676 m)  Wt 200 lb (90.719 kg)  BMI 32.30 kg/m2  General: No Acute Distress HEENT: Atraumatic, normocephalic, conjunctivae normal without scleral icterus.  No nasal discharge, hearing grossly intact, TMs with good landmarks bilaterally with no middle ear abnormalities, posterior pharynx clear without oral lesions. Neck: Supple, trachea midline, no cervical nor supraclavicular adenopathy. Pulmonary: Clear to auscultation bilaterally without wheezing, rhonchi, nor rales. Cardiac: Regular rate and rhythm.  No murmurs, rubs, nor gallops. No peripheral edema.  2+ peripheral pulses bilaterally. Abdomen: Bowel sounds normal.  No masses.  Non-tender  without rebound.  Negative Murphy's sign. GU: Deferred to OB/GYN MSK: Grossly intact, no signs of weakness.  Full strength throughout upper and lower extremities.  Full ROM in upper and lower extremities.  No midline spinal tenderness. Neuro: Gait unremarkable, CN II-XII grossly intact.  C5-C6 Reflex 2/4 Bilaterally, L4 Reflex 2/4 Bilaterally.  Cerebellar function intact. Skin: No rashes. She does have a seborrheic keratosis on the right shoulder Psych: Alert and oriented to person/place/time.  Thought process normal. No anxiety/depression.   Assessment & Plan: [name] was seen today for annual exam.  Diagnoses and associated orders for this visit:  Annual physical exam - TSH  Hyperlipidemia - Lipid panel  Prediabetes - COMPLETE METABOLIC PANEL WITH GFR - Hemoglobin A1c  Encounter for monitoring statin therapy - Lipid panel - Hemoglobin A1c  Joint pain - Antinuclear Antib (ANA)  Breast cancer screening - MM DIGITAL SCREENING BILATERAL; Future    Healthy lifestyle interventions including but not limited to regular exercise, a healthy low fat diet, moderation of salt intake, the dangers of tobacco/alcohol/recreational drug use, nutrition supplementation, and accident avoidance were discussed with the patient and a handout was provided for future reference. Prediabetes: Checking fasting blood sugar and A1c today checking thyroid function Hyperlipidemia: Due for repeat lipid panel and liver enzymes Joint pain: Checking ANA given family history of autoimmune issues    Return in about 6 months (around 02/25/2014) for Mood and Cholesterol FU.

## 2013-08-26 NOTE — Patient Instructions (Signed)
Dr. Caitriona Sundquist's General Advice Following Your Complete Physical Exam  The Benefits of Regular Exercise: Unless you suffer from an uncontrolled cardiovascular condition, studies strongly suggest that regular exercise and physical activity will add to both the quality and length of your life.  The World Health Organization recommends 150 minutes of moderate intensity aerobic activity every week.  This is best split over 3-4 days a week, and can be as simple as a brisk walk for just over 35 minutes "most days of the week".  This type of exercise has been shown to lower LDL-Cholesterol, lower average blood sugars, lower blood pressure, lower cardiovascular disease risk, improve memory, and increase one's overall sense of wellbeing.  The addition of anaerobic (or "strength training") exercises offers additional benefits including but not limited to increased metabolism, prevention of osteoporosis, and improved overall cholesterol levels.  How Can I Strive For A Low-Fat Diet?: Current guidelines recommend that 25-35 percent of your daily energy (food) intake should come from fats.  One might ask how can this be achieved without having to dissect each meal on a daily basis?  Switch to skim or 1% milk instead of whole milk.  Focus on lean meats such as ground turkey, fresh fish, baked chicken, and lean cuts of beef as your source of dietary protein.  Limit saturated fat consumption to less than 10% of your daily caloric intake.  Limit trans fatty acid consumption primarily by limiting synthetic trans fats such as partially hydrogenated oils (Ex: fried fast foods).  Substitute olive or vegetable oil for solid fats where possible.  Moderation of Salt Intake: Provided you don't carry a diagnosis of congestive heart failure nor renal failure, I recommend a daily allowance of no more than 2300 mg of salt (sodium).  Keeping under this daily goal is associated with a decreased risk of cardiovascular events, creeping  above it can lead to elevated blood pressures and increases your risk of cardiovascular events.  Milligrams (mg) of salt is listed on all nutrition labels, and your daily intake can add up faster than you think.  Most canned and frozen dinners can pack in over half your daily salt allowance in one meal.    Lifestyle Health Risks: Certain lifestyle choices carry specific health risks.  As you may already know, tobacco use has been associated with increasing one's risk of cardiovascular disease, pulmonary disease, numerous cancers, among many other issues.  What you may not know is that there are medications and nicotine replacement strategies that can more than double your chances of successfully quitting.  I would be thrilled to help manage your quitting strategy if you currently use tobacco products.  When it comes to alcohol use, I've yet to find an "ideal" daily allowance.  Provided an individual does not have a medical condition that is exacerbated by alcohol consumption, general guidelines determine "safe drinking" as no more than two standard drinks for a man or no more than one standard drink for a female per day.  However, much debate still exists on whether any amount of alcohol consumption is technically "safe".  My general advice, keep alcohol consumption to a minimum for general health promotion.  If you or others believe that alcohol, tobacco, or recreational drug use is interfering with your life, I would be happy to provide confidential counseling regarding treatment options.  General "Over The Counter" Nutrition Advice: Postmenopausal women should aim for a daily calcium intake of 1200 mg, however a significant portion of this might already be   provided by diets including milk, yogurt, cheese, and other dairy products.  Vitamin D has been shown to help preserve bone density, prevent fatigue, and has even been shown to help reduce falls in the elderly.  Ensuring a daily intake of 800 Units of  Vitamin D is a good place to start to enjoy the above benefits, we can easily check your Vitamin D level to see if you'd potentially benefit from supplementation beyond 800 Units a day.  Folic Acid intake should be of particular concern to women of childbearing age.  Daily consumption of 400-800 mcg of Folic Acid is recommended to minimize the chance of spinal cord defects in a fetus should pregnancy occur.    For many adults, accidents still remain one of the most common culprits when it comes to cause of death.  Some of the simplest but most effective preventitive habits you can adopt include regular seatbelt use, proper helmet use, securing firearms, and regularly testing your smoke and carbon monoxide detectors.  Verl Whitmore B. Jaymian Bogart DO [facility] [address], [address] [place], [place] [address] Phone: [phone]  

## 2013-08-27 ENCOUNTER — Telehealth: Payer: Self-pay | Admitting: Family Medicine

## 2013-08-27 DIAGNOSIS — E785 Hyperlipidemia, unspecified: Secondary | ICD-10-CM

## 2013-08-27 LAB — TSH: TSH: 2.702 u[IU]/mL (ref 0.350–4.500)

## 2013-08-27 LAB — ANA: Anti Nuclear Antibody(ANA): NEGATIVE

## 2013-08-27 MED ORDER — ROSUVASTATIN CALCIUM 20 MG PO TABS: 20.0000 mg | ORAL_TABLET | Freq: Every day | ORAL | Status: DC

## 2013-08-27 NOTE — Telephone Encounter (Signed)
Seth Bake, Will you please let patient know that fasting blood sugar, kidney function , lung function, and thyroid were normal.  3 month average blood sugar remains in the prediabetic range but unchanged from the value last year.  The only abnormality so far was worsening LDL cholesterol.  I'd recommend she switch from generic lipitor to generic zocor which I've sent to her [facility] on file. We'll want to recheck cholesterol in 3 months.  ANA test is still pending.

## 2013-08-27 NOTE — Telephone Encounter (Signed)
Pt.notified

## 2013-09-17 ENCOUNTER — Encounter: Payer: Self-pay | Admitting: Family Medicine

## 2013-09-17 ENCOUNTER — Ambulatory Visit (INDEPENDENT_AMBULATORY_CARE_PROVIDER_SITE_OTHER): Payer: 59 | Admitting: Family Medicine

## 2013-09-17 VITALS — BP 127/86 | HR 89 | Wt 201.0 lb

## 2013-09-17 DIAGNOSIS — S93402A Sprain of unspecified ligament of left ankle, initial encounter: Secondary | ICD-10-CM

## 2013-09-17 DIAGNOSIS — S93409A Sprain of unspecified ligament of unspecified ankle, initial encounter: Secondary | ICD-10-CM

## 2013-09-17 NOTE — Progress Notes (Signed)
CC: Sarah Nicholson is a 43 y.o. female is here for left ankle pain   Subjective: HPI:  Left ankle pain that has been present since yesterday, immediate pain after inversion injury where she lost her balance going down stairs. She was immediately unable to bear weight however within minutes was able to climb back upstairs and get into her house. Pain is described as pain, mild at rest, moderate with weightbearing. She's able to bear weight today in the office. Pain is localized on the lateral ankle anteriorly and medial ankle anteriorly. There has been no swelling or overlying skin changes nor bruising since the injury. Pain has slightly improved since onset with rest, elevation and ice.  Denies shortness of breath, rapid heartbeat, nor joint pain elsewhere.  Review Of Systems Outlined In HPI  Past Medical History  Diagnosis Date  . Prediabetes 06/14/2012  . Hyperlipidemia 06/14/2012  . Seasonal allergies 06/14/2012  . History of leukocytosis 06/14/2012    Reports "WBC 32" decades ago with negative hematology specialist workup.   . Depression 06/14/2012  . ADD (attention deficit disorder) 06/14/2012    Past Surgical History  Procedure Laterality Date  . Appendectomy    . Tubal ligation    . Intrauterine ablation    . Bladder sling/mesh    . Nasal sinus surgery     Family History  Problem Relation Age of Onset  . Depression Mother   . Diabetes Father   . Hyperlipidemia Father   . Hypertension Father   . Depression Sister   . Depression Son   . Breast cancer Maternal Grandmother     History   Social History  . Marital Status: Married    Spouse Name: N/A    Number of Children: N/A  . Years of Education: N/A   Occupational History  . Not on file.   Social History Main Topics  . Smoking status: Current Every Day Smoker  . Smokeless tobacco: Not on file  . Alcohol Use: Not on file  . Drug Use: Not on file  . Sexual Activity: Not on file   Other Topics Concern  . Not on file    Social History Narrative  . No narrative on file     Objective: BP 127/86  Pulse 89  Wt 201 lb (91.173 kg)  Vital signs reviewed. General: Alert and Oriented, No Acute Distress HEENT: Pupils equal, round, reactive to light. Conjunctivae clear.  External ears unremarkable.  Moist mucous membranes. Lungs: Clear and comfortable work of breathing, speaking in full sentences without accessory muscle use. Cardiac: Regular rate and rhythm.  Extremities: No peripheral edema.  Strong peripheral pulses. No swelling or redness in the left ankle. No pain in the navicular or base of the fifth metatarsal. No reproduction of pain with compression of mid shaft tibia and tibia.  Pain is reproduced with palpation of the ATF ligament, for range of motion and strength of the ankle without laxity. Mental Status: No depression, anxiety, nor agitation. Logical though process. Skin: Warm and dry.  Assessment & Plan: Sarah Nicholson was seen today for left ankle pain.  Diagnoses and associated orders for this visit:  Left ankle sprain, initial encounter    Left ankle sprain: Continue rice, I encouraged her to start daily rehabilitative exercises and to wear a air cast splint that she was provided with today. Followup in 1-2 weeks if no improvement of pain.  Start ibuprofen 800 mg every 8 hours.  Return if symptoms worsen or fail to improve.

## 2014-03-29 ENCOUNTER — Emergency Department (INDEPENDENT_AMBULATORY_CARE_PROVIDER_SITE_OTHER)
Admission: EM | Admit: 2014-03-29 | Discharge: 2014-03-29 | Disposition: A | Discharge status: Home / Self Care | Payer: 59 | Source: Home / Self Care | Attending: Family Medicine | Admitting: Family Medicine

## 2014-03-29 DIAGNOSIS — R5383 Other fatigue: Secondary | ICD-10-CM

## 2014-03-29 LAB — COMPLETE METABOLIC PANEL WITH GFR
ALK PHOS: 61 U/L (ref 39–117)
ALT: 9 U/L (ref 0–35)
AST: 11 U/L (ref 0–37)
Albumin: 3.9 g/dL (ref 3.5–5.2)
BILIRUBIN TOTAL: 0.3 mg/dL (ref 0.2–1.2)
BUN: 11 mg/dL (ref 6–23)
CO2: 21 mEq/L (ref 19–32)
CREATININE: 0.79 mg/dL (ref 0.50–1.10)
Calcium: 8.9 mg/dL (ref 8.4–10.5)
Chloride: 105 mEq/L (ref 96–112)
GLUCOSE: 70 mg/dL (ref 70–99)
Potassium: 4 mEq/L (ref 3.5–5.3)
SODIUM: 137 meq/L (ref 135–145)
TOTAL PROTEIN: 7 g/dL (ref 6.0–8.3)

## 2014-03-29 LAB — POCT URINALYSIS DIP (MANUAL ENTRY)
BILIRUBIN UA: NEGATIVE
BILIRUBIN UA: NEGATIVE
Glucose, UA: NEGATIVE
LEUKOCYTES UA: NEGATIVE
Nitrite, UA: NEGATIVE
Protein Ur, POC: NEGATIVE
Spec Grav, UA: <=1.005
Urobilinogen, UA: 0.2
pH, UA: 6

## 2014-03-29 LAB — TSH: TSH: 1.738 u[IU]/mL (ref 0.350–4.500)

## 2014-03-29 LAB — SEDIMENTATION RATE: SED RATE: 12 mm/h (ref 0–22)

## 2014-03-29 NOTE — ED Provider Notes (Signed)
CSN: [id number]     Arrival date & time 03/29/14  1438 History   First MD Initiated Contact with Patient 03/29/14 1506     Chief Complaint  Patient presents with  . Fatigue      HPI Comments: Patient reports that she had a flu-like illness about 3 weeks ago with chills, fatigue, and fever to 102.6.  However, she had no sore throat, nasal congestion, or cough.  1.5 weeks later she developed URI symptoms with mild sore throat, sneezing, sinus congestion, myalgias, and cough.  She now complains of mild persistent sinus congestion. Over the past 2 to 3 days she has been increasingly fatigued.  She has had increased hunger and thirst.  She notes frequent urination but she has been drinking more fluids.  She reports that since 03/07/14 her weight has decreased from 208 pounds to 194 pounds today. She states that she has checked fasting glucose which has ranged 90 to 100. She states that she stopped taking her usual meds (Lexapro, Zyrtec, Adderall, and Crestor) last summer. Patient has had gestational diabetes. Family history of diabetes in her father, and thyroid disorder in her maternal grandmother.  The history is provided by the patient.    Past Medical History  Diagnosis Date  . Prediabetes 06/14/2012  . Hyperlipidemia 06/14/2012  . Seasonal allergies 06/14/2012  . History of leukocytosis 06/14/2012    Reports "WBC 32" decades ago with negative hematology specialist workup.   . Depression 06/14/2012  . ADD (attention deficit disorder) 06/14/2012   Past Surgical History  Procedure Laterality Date  . Appendectomy    . Tubal ligation    . Intrauterine ablation    . Bladder sling/mesh    . Nasal sinus surgery     Family History  Problem Relation Age of Onset  . Depression Mother   . Diabetes Father   . Hyperlipidemia Father   . Hypertension Father   . Depression Sister   . Depression Son   . Breast cancer Maternal Grandmother    History  Substance Use Topics  . Smoking status: Current  Every Day Smoker  . Smokeless tobacco: Not on file  . Alcohol Use: Not on file   OB History    No data available     Review of Systems  + Weight loss No sore throat No cough No pleuritic pain No wheezing No nasal congestion No post-nasal drainage No sinus pain/pressure No itchy/red eyes No earache + dizziness No hemoptysis No SOB No fever/chills No nausea No vomiting No abdominal pain No diarrhea No urinary symptoms No skin rash + fatigue No myalgias No headache Used OTC meds without relief  Allergies  Macrobid and Tetanus toxoids  Home Medications   Prior to Admission medications   Medication Sig Start Date End Date Taking? Authorizing Provider  amphetamine-dextroamphetamine (ADDERALL) 10 MG tablet Take 1 tablet (10 mg total) by mouth 2 (two) times daily. 06/25/13 06/25/14  Marcial Pacas, DO  cetirizine (ZYRTEC ALLERGY) 10 MG tablet Take 1 tablet (10 mg total) by mouth daily. 06/14/12   Sean Hommel, DO  EPINEPHrine (EPIPEN) 0.3 mg/0.3 mL SOAJ injection Inject 0.3 mLs (0.3 mg total) into the muscle as needed. As instructed if needed for severe allergic reaction 05/24/13   Jacqulyn Cane, MD  escitalopram (LEXAPRO) 20 MG tablet Take 1 tablet (20 mg total) by mouth daily. 06/25/13   Sean Hommel, DO  rosuvastatin (CRESTOR) 20 MG tablet Take 1 tablet (20 mg total) by mouth daily. 08/27/13  Sean Hommel, DO   BP 137/86 mmHg  Pulse 72  Temp(Src) 98.2 F (36.8 C) (Oral)  Wt 194 lb (87.998 kg)  SpO2 98% Physical Exam Nursing notes and Vital Signs reviewed. Appearance:  Patient appears stated age, and in no acute distress Eyes:  Pupils are equal, round, and reactive to light and accomodation.  Extraocular movement is intact.  Conjunctivae are not inflamed  Ears:  Canals normal.  Tympanic membranes normal.  Nose:  Mildly congested turbinates.  No sinus tenderness.    Pharynx:  Normal Neck:  Supple.  No adenopathy or thyromegaly. Lungs:  Clear to auscultation.  Breath sounds are  equal.  Heart:  Regular rate and rhythm without murmurs, rubs, or gallops.  Abdomen:  Nontender without masses or hepatosplenomegaly.  Bowel sounds are present.  No CVA or flank tenderness.  Extremities:  No edema.  No calf tenderness Skin:  No rash present.   ED Course  Procedures  None    Labs Reviewed  POCT URINALYSIS DIP (MANUAL ENTRY) - Abnormal; Notable for the following: BLO moderate; otherwise negative  COMPLETE METABOLIC PANEL WITH GFR  TSH  HEMOGLOBIN A1C  SEDIMENTATION RATE  POCT CBC W AUTO DIFF ([facility]):  WBC 13.1; LY 34.6; MO 5.5; GR 59.9; Hgb 14.2; Platelets 295          MDM   1. Other fatigue ? Etiology.  Note mild leukocytosis 13.1.    Screening abs pending:  CMP, TSH, Hgb A1C; Sed rate Rest, maintain adequate fluid intake, and check temperature daily.  If symptoms become significantly worse during the night or over the weekend, proceed to the local emergency room.  Followup with Family Doctor if not improved in four days.    Kandra Nicolas, MD 03/31/14 [phone]

## 2014-03-29 NOTE — Discharge Instructions (Signed)
Rest, maintain adequate fluid intake, and check temperature daily.  If symptoms become significantly worse during the night or over the weekend, proceed to the local emergency room.    Fatigue Fatigue is a feeling of tiredness, lack of energy, lack of motivation, or feeling tired all the time. Having enough rest, good nutrition, and reducing stress will normally reduce fatigue. Consult your caregiver if it persists. The nature of your fatigue will help your caregiver to find out its cause. The treatment is based on the cause.  CAUSES  There are many causes for fatigue. Most of the time, fatigue can be traced to one or more of your habits or routines. Most causes fit into one or more of three general areas. They are: Lifestyle problems  Sleep disturbances.  Overwork.  Physical exertion.  Unhealthy habits.  Poor eating habits or eating disorders.  Alcohol and/or drug use .  Lack of proper nutrition (malnutrition). Psychological problems  Stress and/or anxiety problems.  Depression.  Grief.  Boredom. Medical Problems or Conditions  Anemia.  Pregnancy.  Thyroid gland problems.  Recovery from major surgery.  Continuous pain.  Emphysema or asthma that is not well controlled  Allergic conditions.  Diabetes.  Infections (such as mononucleosis).  Obesity.  Sleep disorders, such as sleep apnea.  Heart failure or other heart-related problems.  Cancer.  Kidney disease.  Liver disease.  Effects of certain medicines such as antihistamines, cough and cold remedies, prescription pain medicines, heart and blood pressure medicines, drugs used for treatment of cancer, and some antidepressants. SYMPTOMS  The symptoms of fatigue include:   Lack of energy.  Lack of drive (motivation).  Drowsiness.  Feeling of indifference to the surroundings. DIAGNOSIS  The details of how you feel help guide your caregiver in finding out what is causing the fatigue. You will be  asked about your present and past health condition. It is important to review all medicines that you take, including prescription and non-prescription items. A thorough exam will be done. You will be questioned about your feelings, habits, and normal lifestyle. Your caregiver may suggest blood tests, urine tests, or other tests to look for common medical causes of fatigue.  TREATMENT  Fatigue is treated by correcting the underlying cause. For example, if you have continuous pain or depression, treating these causes will improve how you feel. Similarly, adjusting the dose of certain medicines will help in reducing fatigue.  HOME CARE INSTRUCTIONS   Try to get the required amount of good sleep every night.  Eat a healthy and nutritious diet, and drink enough water throughout the day.  Practice ways of relaxing (including yoga or meditation).  Exercise regularly.  Make plans to change situations that cause stress. Act on those plans so that stresses decrease over time. Keep your work and personal routine reasonable.  Avoid street drugs and minimize use of alcohol.  Start taking a daily multivitamin after consulting your caregiver. SEEK MEDICAL CARE IF:   You have persistent tiredness, which cannot be accounted for.  You have fever.  You have unintentional weight loss.  You have headaches.  You have disturbed sleep throughout the night.  You are feeling sad.  You have constipation.  You have dry skin.  You have gained weight.  You are taking any new or different medicines that you suspect are causing fatigue.  You are unable to sleep at night.  You develop any unusual swelling of your legs or other parts of your body. [place]  IF:   You are feeling confused.  Your vision is blurred.  You feel faint or pass out.  You develop severe headache.  You develop severe abdominal, pelvic, or back pain.  You develop chest pain, shortness of breath, or an  irregular or fast heartbeat.  You are unable to pass a normal amount of urine.  You develop abnormal bleeding such as bleeding from the rectum or you vomit blood.  You have thoughts about harming yourself or committing suicide.  You are worried that you might harm someone else. MAKE SURE YOU:   Understand these instructions.  Will watch your condition.  Will get help right away if you are not doing well or get worse. Document Released: 12/26/2006 Document Revised: 05/23/2011 Document Reviewed: 07/02/2013 [facility] Patient Information 2015 [place], [place]. This information is not intended to replace advice given to you by your health care provider. Make sure you discuss any questions you have with your health care provider.

## 2014-03-29 NOTE — ED Notes (Signed)
Patient complains of fatigue, states that she just doesn't feel good in general, states he has been very thirsty and has dry mouth lately, she has checked her sugars and fasting are 90-100. She has been having frequent urination but she is also drinking water constantly. She reports that she has taken herself off her medications (Lexapro, Zyrtec,Adderall, Crestor)  but she keeps a Epipen, she states she stopped taking the medications back in the summer

## 2014-03-31 ENCOUNTER — Encounter: Payer: Self-pay | Admitting: Family Medicine

## 2014-03-31 ENCOUNTER — Ambulatory Visit (INDEPENDENT_AMBULATORY_CARE_PROVIDER_SITE_OTHER): Payer: 59 | Admitting: Family Medicine

## 2014-03-31 VITALS — BP 118/75 | HR 83 | Wt 193.0 lb

## 2014-03-31 DIAGNOSIS — R5383 Other fatigue: Secondary | ICD-10-CM

## 2014-03-31 LAB — POCT CBC W AUTO DIFF (K'VILLE URGENT CARE)

## 2014-03-31 NOTE — Progress Notes (Signed)
CC: Sarah Nicholson is a 44 y.o. female is here for Follow-up   Subjective: HPI:  Fatigue, dry mouth, polydipsia that has been present to a mild degree since a viral respiratory illness around Christmas. Symptoms seem to be getting better until a second viral upper respiratory illness about a week ago then symptoms again began to improve only to worsen over the weekend. They're currently moderate to severe in severity. She reports going shopping on Saturday and having to sleep the rest of the day due to exhaustion. She reports that fatigue is described as a weakness all over concentrated at No one particular part of the body. No interventions as of yet other than normal complete metabolic panel, sedimentation rate, urinalysis without glucose in the urine and a CBC with a mildly elevated blood cell count.  She's also been checking her blood sugar at home all fasting blood sugars are less than 100 and the highest blood sugar she's had was 132 hours after a meal.  Denies fevers, chills, shortness of breath, wheezing, joint pain, nor any motor or sensory disturbances other than that above. She denies dry eyes. She denies depression.  Denies any skin changes or gastrointestinal complaints. Symptoms are present all hours of the day. Denies nonrestorative sleep nor snoring.   Review Of Systems Outlined In HPI  Past Medical History  Diagnosis Date  . Prediabetes 06/14/2012  . Hyperlipidemia 06/14/2012  . Seasonal allergies 06/14/2012  . History of leukocytosis 06/14/2012    Reports "WBC 32" decades ago with negative hematology specialist workup.   . Depression 06/14/2012  . ADD (attention deficit disorder) 06/14/2012    Past Surgical History  Procedure Laterality Date  . Appendectomy    . Tubal ligation    . Intrauterine ablation    . Bladder sling/mesh    . Nasal sinus surgery     Family History  Problem Relation Age of Onset  . Depression Mother   . Diabetes Father   . Hyperlipidemia Father   .  Hypertension Father   . Depression Sister   . Depression Son   . Breast cancer Maternal Grandmother     History   Social History  . Marital Status: Married    Spouse Name: N/A    Number of Children: N/A  . Years of Education: N/A   Occupational History  . Not on file.   Social History Main Topics  . Smoking status: Current Every Day Smoker  . Smokeless tobacco: Not on file  . Alcohol Use: Not on file  . Drug Use: Not on file  . Sexual Activity: Not on file   Other Topics Concern  . Not on file   Social History Narrative     Objective: BP 118/75 mmHg  Pulse 83  Wt 193 lb (87.544 kg)  General: Alert and Oriented, No Acute Distress HEENT: Pupils equal, round, reactive to light. Conjunctivae clear.  External ears unremarkable, canals clear with intact TMs with appropriate landmarks.  Middle ear appears open without effusion. Pink inferior turbinates.  Moist mucous membranes, pharynx without inflammation nor lesions.  Neck supple without palpable lymphadenopathy nor abnormal masses. Lungs: Clear to auscultation bilaterally, no wheezing/ronchi/rales.  Comfortable work of breathing. Good air movement. Cardiac: Regular rate and rhythm. Normal S1/S2.  No murmurs, rubs, nor gallops.   Extremities: No peripheral edema.  Strong peripheral pulses.  Mental Status: No depression, anxiety, nor agitation. Skin: Warm and dry.  Assessment & Plan: Janiyha was seen today for follow-up.  Diagnoses and  associated orders for this visit:  Other fatigue - CMV IgM - TSH - Monospot   Ruling out hypothyroidism and viral infections above. Ultimate plan will be based on these results.  Return if symptoms worsen or fail to improve.

## 2014-04-01 LAB — MONONUCLEOSIS SCREEN: Mono Screen: NEGATIVE

## 2014-04-01 LAB — TSH: TSH: 1.371 u[IU]/mL (ref 0.350–4.500)

## 2014-04-02 LAB — HEMOGLOBIN A1C
Hgb A1c MFr Bld: 6 % — ABNORMAL HIGH (ref <5.7)
Mean Plasma Glucose: 126 mg/dL — ABNORMAL HIGH (ref <117)

## 2014-04-02 LAB — CMV IGM

## 2014-06-20 ENCOUNTER — Ambulatory Visit (INDEPENDENT_AMBULATORY_CARE_PROVIDER_SITE_OTHER): Payer: 59 | Admitting: Family Medicine

## 2014-06-20 ENCOUNTER — Encounter: Payer: Self-pay | Admitting: Family Medicine

## 2014-06-20 VITALS — BP 130/79 | HR 76 | Wt 196.0 lb

## 2014-06-20 DIAGNOSIS — F909 Attention-deficit hyperactivity disorder, unspecified type: Secondary | ICD-10-CM | POA: Diagnosis not present

## 2014-06-20 DIAGNOSIS — F988 Other specified behavioral and emotional disorders with onset usually occurring in childhood and adolescence: Secondary | ICD-10-CM

## 2014-06-20 DIAGNOSIS — F17203 Nicotine dependence unspecified, with withdrawal: Secondary | ICD-10-CM

## 2014-06-20 MED ORDER — AMPHETAMINE-DEXTROAMPHETAMINE 10 MG PO TABS
10.0000 mg | ORAL_TABLET | Freq: Two times a day (BID) | ORAL | Status: DC
Start: 2014-06-20 — End: 2014-10-13

## 2014-06-20 MED ORDER — ATENOLOL 50 MG PO TABS: 50.0000 mg | ORAL_TABLET | Freq: Every day | ORAL | Status: DC

## 2014-06-20 NOTE — Progress Notes (Signed)
CC: Sarah Nicholson is a 44 y.o. female is here for Nicotine Dependence   Subjective: HPI:  Last week patient substantially cut down on smoking habit. She is currently smoking a total of 1-2 cigarettes a day now taking about 1-2 puffs a time and then re-lighting the butt later in the day. She is having some help with a nicotine patch at the 21 mg formulation. She still has cravings however it slowly improving with respect to severity and frequency. Her biggest complaint is that she finds that she is extremely jittery now constantly having to move her hands and feels hyperactive. She denies any mental disturbance other than the above specifically no anxiety paranoia nor cognitive impairment other than ADD concerns below.she wants no further something that she can take to take care of her jitteriness.  She's also noticed that since she stopped smoking her distractibility and impulsivity has worsened. She believes that nicotine was helping her with these conditions along with occasionally taking Adderall. She's not been on her Adderall for about a month now and feels like attention and distractibility is getting in the way with job responsibilities.   Review Of Systems Outlined In HPI  Past Medical History  Diagnosis Date  . Prediabetes 06/14/2012  . Hyperlipidemia 06/14/2012  . Seasonal allergies 06/14/2012  . History of leukocytosis 06/14/2012    Reports "WBC 32" decades ago with negative hematology specialist workup.   . Depression 06/14/2012  . ADD (attention deficit disorder) 06/14/2012    Past Surgical History  Procedure Laterality Date  . Appendectomy    . Tubal ligation    . Intrauterine ablation    . Bladder sling/mesh    . Nasal sinus surgery     Family History  Problem Relation Age of Onset  . Depression Mother   . Diabetes Father   . Hyperlipidemia Father   . Hypertension Father   . Depression Sister   . Depression Son   . Breast cancer Maternal Grandmother     History    Social History  . Marital Status: Married    Spouse Name: N/A  . Number of Children: N/A  . Years of Education: N/A   Occupational History  . Not on file.   Social History Main Topics  . Smoking status: Current Every Day Smoker  . Smokeless tobacco: Not on file  . Alcohol Use: Not on file  . Drug Use: Not on file  . Sexual Activity: Not on file   Other Topics Concern  . Not on file   Social History Narrative     Objective: BP 130/79 mmHg  Pulse 76  Wt 196 lb (88.905 kg)  Vital signs reviewed. General: Alert and Oriented, No Acute Distress HEENT: Pupils equal, round, reactive to light. Conjunctivae clear.  External ears unremarkable.  Moist mucous membranes. Lungs: Clear and comfortable work of breathing, speaking in full sentences without accessory muscle use. Cardiac: Regular rate and rhythm.  Neuro: CN II-XII grossly intact, gait normal. Extremities: No peripheral edema.  Strong peripheral pulses.  Mental Status: No depression, anxiety, nor agitation. Logical though process. Skin: Warm and dry.  Assessment & Plan: Breslyn was seen today for nicotine dependence.  Diagnoses and all orders for this visit:  ADD (attention deficit disorder) Orders: -     amphetamine-dextroamphetamine (ADDERALL) 10 MG tablet; Take 1 tablet (10 mg total) by mouth 2 (two) times daily.  Nicotine withdrawal  Other orders -     atenolol (TENORMIN) 50 MG tablet; Take 1 tablet (  50 mg total) by mouth daily.   ADD: Uncontrolled, restart Adderall at former regimen Nicotine withdrawal: She tells me that one of her pharmacist colleagues has suggested a medication that starts with a and ends in -lol. Discussed with her that from 100% sure what she is referring to but it was unreasonable that the beta blocker atenolol would help with some of her jitteriness and hyperactivity. Will start this since it looks like her blood pressure will tolerate it but also discussed cutting the dose in half she  should begin to experience lightheadedness.   Return if symptoms worsen or fail to improve.

## 2014-07-31 DIAGNOSIS — I209 Angina pectoris, unspecified: Secondary | ICD-10-CM | POA: Insufficient documentation

## 2014-07-31 DIAGNOSIS — I959 Hypotension, unspecified: Secondary | ICD-10-CM | POA: Insufficient documentation

## 2014-07-31 DIAGNOSIS — R079 Chest pain, unspecified: Secondary | ICD-10-CM | POA: Insufficient documentation

## 2014-07-31 DIAGNOSIS — R51 Headache: Secondary | ICD-10-CM

## 2014-07-31 DIAGNOSIS — R519 Headache, unspecified: Secondary | ICD-10-CM | POA: Insufficient documentation

## 2014-10-10 ENCOUNTER — Ambulatory Visit: Payer: 59 | Admitting: Family Medicine

## 2014-10-13 ENCOUNTER — Ambulatory Visit (INDEPENDENT_AMBULATORY_CARE_PROVIDER_SITE_OTHER): Payer: 59 | Admitting: Family Medicine

## 2014-10-13 ENCOUNTER — Encounter: Payer: Self-pay | Admitting: Family Medicine

## 2014-10-13 ENCOUNTER — Ambulatory Visit (INDEPENDENT_AMBULATORY_CARE_PROVIDER_SITE_OTHER): Payer: 59

## 2014-10-13 VITALS — BP 107/68 | HR 77 | Wt 207.0 lb

## 2014-10-13 DIAGNOSIS — M25552 Pain in left hip: Secondary | ICD-10-CM | POA: Diagnosis not present

## 2014-10-13 DIAGNOSIS — F988 Other specified behavioral and emotional disorders with onset usually occurring in childhood and adolescence: Secondary | ICD-10-CM

## 2014-10-13 DIAGNOSIS — F329 Major depressive disorder, single episode, unspecified: Secondary | ICD-10-CM

## 2014-10-13 DIAGNOSIS — F32A Depression, unspecified: Secondary | ICD-10-CM

## 2014-10-13 DIAGNOSIS — F909 Attention-deficit hyperactivity disorder, unspecified type: Secondary | ICD-10-CM | POA: Diagnosis not present

## 2014-10-13 MED ORDER — AMPHETAMINE-DEXTROAMPHETAMINE 10 MG PO TABS: 10.0000 mg | ORAL_TABLET | Freq: Two times a day (BID) | ORAL | Status: DC

## 2014-10-13 MED ORDER — ESCITALOPRAM OXALATE 20 MG PO TABS: 20.0000 mg | ORAL_TABLET | Freq: Every day | ORAL | Status: DC

## 2014-10-13 NOTE — Progress Notes (Signed)
CC: Sarah Nicholson is a 44 y.o. female is here for left hip pain   Subjective: HPI:  Left hip pain that has been present for the past 3 months on a daily basis. It's worse with walking, lying on either right or left side. It is slightly improved with taking Aleve or ibuprofen. It's not uncommon for her to wake her up in the middle the night. Pain is moderate in severity and present on a daily basis. She's tried some home exercises that she saw online but these did not help worsen her pain. Pain is localized in the posterior aspect of the hip and radiates somewhat forward but not entirely into the groin. She denies any recent or remote trauma or overexertion. Pain is nonradiating. She denies any motor or sensory disturbances in the left lower extremity. Pain is described only has pain.   Follow-up ADHD: She would like a refill on Adderall. She takes it only on days that she feels like her job responsibilities are round be compromised by her easy distractibility. She denies any known side effects.  Follow-up depression: Symptoms have been somewhat influenced by her son now identified as a woman. She tells me that she's accepted this but it has been a challenge to her mood. Currently taking Lexapro daily requesting a refill. Denies any known side effects. No thoughts 1 or others   Review Of Systems Outlined In HPI  Past Medical History  Diagnosis Date  . Prediabetes 06/14/2012  . Hyperlipidemia 06/14/2012  . Seasonal allergies 06/14/2012  . History of leukocytosis 06/14/2012    Reports "WBC 32" decades ago with negative hematology specialist workup.   . Depression 06/14/2012  . ADD (attention deficit disorder) 06/14/2012    Past Surgical History  Procedure Laterality Date  . Appendectomy    . Tubal ligation    . Intrauterine ablation    . Bladder sling/mesh    . Nasal sinus surgery     Family History  Problem Relation Age of Onset  . Depression Mother   . Diabetes Father   . Hyperlipidemia  Father   . Hypertension Father   . Depression Sister   . Depression Son   . Breast cancer Maternal Grandmother     History   Social History  . Marital Status: Married    Spouse Name: N/A  . Number of Children: N/A  . Years of Education: N/A   Occupational History  . Not on file.   Social History Main Topics  . Smoking status: Current Every Day Smoker  . Smokeless tobacco: Not on file  . Alcohol Use: Not on file  . Drug Use: Not on file  . Sexual Activity: Not on file   Other Topics Concern  . Not on file   Social History Narrative     Objective: BP 107/68 mmHg  Pulse 77  Wt 207 lb (93.895 kg)  Vital signs reviewed. General: Alert and Oriented, No Acute Distress HEENT: Pupils equal, round, reactive to light. Conjunctivae clear.  External ears unremarkable.  Moist mucous membranes. Lungs: Clear and comfortable work of breathing, speaking in full sentences without accessory muscle use. Cardiac: Regular rate and rhythm.  Neuro: CN II-XII grossly intact, gait normal. Extremities: No peripheral edema.  Strong peripheral pulses. Full range of motion and strength of left lower extremity. Engaging her hamstrings does not reproduce any of her pain. Pain is reproduced with log roll of the femur both in internal and external rotation. No pain with standing stationary Mental  Status: No depression, anxiety, nor agitation. Logical though process. Skin: Warm and dry.  Assessment & Plan: Abiola was seen today for left hip pain.  Diagnoses and all orders for this visit:  Left hip pain Orders: -     DG HIP UNILAT WITH PELVIS 2-3 VIEWS LEFT; Future  Depression Orders: -     escitalopram (LEXAPRO) 20 MG tablet; Take 1 tablet (20 mg total) by mouth daily.  ADD (attention deficit disorder) Orders: -     amphetamine-dextroamphetamine (ADDERALL) 10 MG tablet; Take 1 tablet (10 mg total) by mouth 2 (two) times daily.   Left hip pain: Obtain x-ray to look for interarticular  abnormality Depression: Controlled continue Lexapro ADD: Controlled continue Adderall.   Return if symptoms worsen or fail to improve.

## 2014-12-09 ENCOUNTER — Ambulatory Visit (INDEPENDENT_AMBULATORY_CARE_PROVIDER_SITE_OTHER): Payer: 59 | Admitting: Family Medicine

## 2014-12-09 ENCOUNTER — Encounter: Payer: Self-pay | Admitting: Family Medicine

## 2014-12-09 VITALS — BP 125/80 | HR 67 | Ht 66.0 in | Wt 209.0 lb

## 2014-12-09 DIAGNOSIS — M2010 Hallux valgus (acquired), unspecified foot: Secondary | ICD-10-CM

## 2014-12-09 DIAGNOSIS — Z0000 Encounter for general adult medical examination without abnormal findings: Secondary | ICD-10-CM

## 2014-12-09 DIAGNOSIS — F909 Attention-deficit hyperactivity disorder, unspecified type: Secondary | ICD-10-CM | POA: Diagnosis not present

## 2014-12-09 DIAGNOSIS — Z1239 Encounter for other screening for malignant neoplasm of breast: Secondary | ICD-10-CM | POA: Diagnosis not present

## 2014-12-09 DIAGNOSIS — F988 Other specified behavioral and emotional disorders with onset usually occurring in childhood and adolescence: Secondary | ICD-10-CM

## 2014-12-09 LAB — LIPID PANEL
Cholesterol: 193 mg/dL (ref 125–200)
HDL: 30 mg/dL — ABNORMAL LOW (ref >=46)
LDL Cholesterol: 133 mg/dL — ABNORMAL HIGH (ref <130)
Total CHOL/HDL Ratio: 6.4 Ratio — ABNORMAL HIGH (ref <=5.0)
Triglycerides: 151 mg/dL — ABNORMAL HIGH (ref <150)
VLDL: 30 mg/dL (ref <30)

## 2014-12-09 LAB — CBC WITH DIFFERENTIAL/PLATELET
BASOS PCT: 0 % (ref 0–1)
Basophils Absolute: 0 10*3/uL (ref 0.0–0.1)
EOS PCT: 1 % (ref 0–5)
Eosinophils Absolute: 0.1 10*3/uL (ref 0.0–0.7)
HEMATOCRIT: 40.1 % (ref 36.0–46.0)
Hemoglobin: 13.7 g/dL (ref 12.0–15.0)
LYMPHS PCT: 26 % (ref 12–46)
Lymphs Abs: 2.5 10*3/uL (ref 0.7–4.0)
MCH: 30 pg (ref 26.0–34.0)
MCHC: 34.2 g/dL (ref 30.0–36.0)
MCV: 87.7 fL (ref 78.0–100.0)
MONO ABS: 0.7 10*3/uL (ref 0.1–1.0)
MONOS PCT: 7 % (ref 3–12)
MPV: 9.5 fL (ref 8.6–12.4)
NEUTROS ABS: 6.3 10*3/uL (ref 1.7–7.7)
Neutrophils Relative %: 66 % (ref 43–77)
Platelets: 301 10*3/uL (ref 150–400)
RBC: 4.57 MIL/uL (ref 3.87–5.11)
RDW: 13.4 % (ref 11.5–15.5)
WBC: 9.6 10*3/uL (ref 4.0–10.5)

## 2014-12-09 LAB — COMPLETE METABOLIC PANEL WITH GFR
ALK PHOS: 59 U/L (ref 33–115)
ALT: 9 U/L (ref 6–29)
AST: 10 U/L (ref 10–35)
Albumin: 3.9 g/dL (ref 3.6–5.1)
BUN: 15 mg/dL (ref 7–25)
CO2: 23 mmol/L (ref 20–31)
Calcium: 8.9 mg/dL (ref 8.6–10.2)
Chloride: 106 mmol/L (ref 98–110)
Creat: 0.81 mg/dL (ref 0.50–1.10)
GFR, Est African American: >89 mL/min (ref >=60)
GFR, Est Non African American: 88 mL/min (ref >=60)
GLUCOSE: 100 mg/dL — AB (ref 65–99)
Potassium: 4 mmol/L (ref 3.5–5.3)
SODIUM: 138 mmol/L (ref 135–146)
Total Bilirubin: 0.3 mg/dL (ref 0.2–1.2)
Total Protein: 6.3 g/dL (ref 6.1–8.1)

## 2014-12-09 MED ORDER — AMPHETAMINE-DEXTROAMPHETAMINE 10 MG PO TABS: 10.0000 mg | ORAL_TABLET | Freq: Two times a day (BID) | ORAL | Status: DC

## 2014-12-09 NOTE — Progress Notes (Signed)
CC: Sarah Nicholson is a 44 y.o. female is here for Annual Exam   Subjective: HPI:  Colonoscopy: no current indication Papsmear: (no history of abnormal Pap smears, her most recent one was exactly 3 years ago per her report, she has elected to follow up with this on a every 5 year basis Mammogram: overdue for screening mammogram, new orders will be placed today, I directed her to the radiology office downstairs following a visit to have his procedure scheduled  Influenza Vaccine: received at work last week Pneumovax: no current indication Td/Tdap: up-to-date Zoster: (Start 46 yo)  Requesting complete physical exam.  Complains of bilateral toe numbness if she is standing on her feet for more than a few hours a day. It improves when she rests her feet without bearing any weight. She denies any overlying skin changes but has noticed a callus forming at the base of the great toe, medial surface bilaterally  Review of Systems - General ROS: negative for - chills, fever, night sweats, weight gain or weight loss Ophthalmic ROS: negative for - decreased vision Psychological ROS: negative for - anxiety or depression ENT ROS: negative for - hearing change, nasal congestion, tinnitus or allergies Hematological and Lymphatic ROS: negative for - bleeding problems, bruising or swollen lymph nodes Breast ROS: negative Respiratory ROS: no cough, shortness of breath, or wheezing Cardiovascular ROS: no chest pain or dyspnea on exertion Gastrointestinal ROS: no abdominal pain, change in bowel habits, or black or bloody stools Genito-Urinary ROS: negative for - genital discharge, genital ulcers, incontinence or abnormal bleeding from genitals Musculoskeletal ROS: negative for - joint pain or muscle pain Neurological ROS: negative for - headaches or memory loss Dermatological ROS: negative for lumps, mole changes, rash and skin lesion changes  Past Medical History  Diagnosis Date  . Prediabetes  06/14/2012  . Hyperlipidemia 06/14/2012  . Seasonal allergies 06/14/2012  . History of leukocytosis 06/14/2012    Reports "WBC 32" decades ago with negative hematology specialist workup.   . Depression 06/14/2012  . ADD (attention deficit disorder) 06/14/2012    Past Surgical History  Procedure Laterality Date  . Appendectomy    . Tubal ligation    . Intrauterine ablation    . Bladder sling/mesh    . Nasal sinus surgery     Family History  Problem Relation Age of Onset  . Depression Mother   . Diabetes Father   . Hyperlipidemia Father   . Hypertension Father   . Depression Sister   . Depression Son   . Breast cancer Maternal Grandmother     Social History   Social History  . Marital Status: Married    Spouse Name: N/A  . Number of Children: N/A  . Years of Education: N/A   Occupational History  . Not on file.   Social History Main Topics  . Smoking status: Current Every Day Smoker  . Smokeless tobacco: Not on file  . Alcohol Use: Not on file  . Drug Use: Not on file  . Sexual Activity: Not on file   Other Topics Concern  . Not on file   Social History Narrative     Objective: BP 125/80 mmHg  Pulse 67  Ht 5\' 6"  (1.676 m)  Wt 209 lb (94.802 kg)  BMI 33.75 kg/m2  General: No Acute Distress HEENT: Atraumatic, normocephalic, conjunctivae normal without scleral icterus.  No nasal discharge, hearing grossly intact, TMs with good landmarks bilaterally with no middle ear abnormalities, posterior pharynx clear without oral lesions.  Neck: Supple, trachea midline, no cervical nor supraclavicular adenopathy. Pulmonary: Clear to auscultation bilaterally without wheezing, rhonchi, nor rales. Cardiac: Regular rate and rhythm.  No murmurs, rubs, nor gallops. No peripheral edema.  2+ peripheral pulses bilaterally. Abdomen: Bowel sounds normal.  No masses.  Non-tender without rebound.  Negative Murphy's sign. GU: patient declined MSK: Grossly intact, no signs of weakness.  Full  strength throughout upper and lower extremities.  Full ROM in upper and lower extremities.  No midline spinal tenderness. Hallux valgus bilaterally Neuro: Gait unremarkable, CN II-XII grossly intact.  C5-C6 Reflex 2/4 Bilaterally, L4 Reflex 2/4 Bilaterally.  Cerebellar function intact. Skin: No rashes. Psych: Alert and oriented to person/place/time.  Thought process normal. No anxiety/depression.   Assessment & Plan: Sarah Nicholson was seen today for annual exam.  Diagnoses and all orders for this visit:  Annual physical exam -     Lipid panel -     COMPLETE METABOLIC PANEL WITH GFR -     MM DIGITAL SCREENING BILATERAL -     CBC with Differential/Platelet  Screening for malignant neoplasm of breast -     MM DIGITAL SCREENING BILATERAL  ADD (attention deficit disorder) -     amphetamine-dextroamphetamine (ADDERALL) 10 MG tablet; Take 1 tablet (10 mg total) by mouth 2 (two) times daily.  Hallux valgus, unspecified laterality  Other orders -     Cancel: CBC -     Cancel: Cytology - PAP  Healthy lifestyle interventions including but not limited to regular exercise, a healthy low fat diet, moderation of salt intake, the dangers of tobacco/alcohol/recreational drug use, nutrition supplementation, and accident avoidance were discussed with the patient and a handout was provided for future reference.  ADHD: Requesting refills of Adderall Hallux valgus: Discussed that she may one day need surgery to help this if it's causing her pain. Discussed offloading techniques today and consideration of following up with one of our sports medicine doctors to see if a custom fitting orthotic will help with her numbness and discomfort while at work on her feet.   Return in about 3 months (around 03/10/2015).

## 2014-12-10 ENCOUNTER — Ambulatory Visit (INDEPENDENT_AMBULATORY_CARE_PROVIDER_SITE_OTHER): Payer: 59 | Admitting: Family Medicine

## 2014-12-10 ENCOUNTER — Encounter: Payer: Self-pay | Admitting: Family Medicine

## 2014-12-10 VITALS — BP 131/91 | HR 78 | Ht 66.0 in | Wt 207.0 lb

## 2014-12-10 DIAGNOSIS — M2010 Hallux valgus (acquired), unspecified foot: Secondary | ICD-10-CM | POA: Diagnosis not present

## 2014-12-10 NOTE — Progress Notes (Signed)

## 2014-12-25 ENCOUNTER — Ambulatory Visit: Payer: 59

## 2015-07-10 ENCOUNTER — Ambulatory Visit (INDEPENDENT_AMBULATORY_CARE_PROVIDER_SITE_OTHER): Payer: 59 | Admitting: Family Medicine

## 2015-07-10 ENCOUNTER — Encounter: Payer: Self-pay | Admitting: Family Medicine

## 2015-07-10 VITALS — BP 134/81 | HR 67 | Wt 200.0 lb

## 2015-07-10 DIAGNOSIS — R319 Hematuria, unspecified: Secondary | ICD-10-CM

## 2015-07-10 DIAGNOSIS — K4090 Unilateral inguinal hernia, without obstruction or gangrene, not specified as recurrent: Secondary | ICD-10-CM | POA: Diagnosis not present

## 2015-07-10 LAB — POCT URINALYSIS DIPSTICK
Bilirubin, UA: NEGATIVE
Glucose, UA: NEGATIVE
KETONES UA: NEGATIVE
LEUKOCYTES UA: NEGATIVE
Nitrite, UA: NEGATIVE
PROTEIN UA: NEGATIVE
UROBILINOGEN UA: 0.2
pH, UA: 6

## 2015-07-10 MED ORDER — OXYCODONE-ACETAMINOPHEN 5-325 MG PO TABS: 1.0000 | ORAL_TABLET | Freq: Three times a day (TID) | ORAL | Status: DC | PRN

## 2015-07-10 NOTE — Progress Notes (Signed)
CC: Sarah Nicholson is a 45 y.o. female is here for Groin Pain   Subjective: HPI:  On Monday of this week she started to experience some dull right groin pain. It's worse with sitting or standing for long periods of time. It's also worse with touching the right groin. It's been slowly worsening. She tells me she just doesn't feel right and has some occasional episodes of dizziness however cannot describe any other symptoms outside of her regular state of health. She denies any fevers, chills, cough, wheezing, abdominal pain, diarrhea, constipation, nor genitourinary complaints. She denies any skin complaints on the lower extremities. She's tried hot compresses and rest but nothing seems to be helping. No overlying skin changes in the right groin but she does feel a tender mass in this region.   Review Of Systems Outlined In HPI  Past Medical History  Diagnosis Date  . Prediabetes 06/14/2012  . Hyperlipidemia 06/14/2012  . Seasonal allergies 06/14/2012  . History of leukocytosis 06/14/2012    Reports "WBC 32" decades ago with negative hematology specialist workup.   . Depression 06/14/2012  . ADD (attention deficit disorder) 06/14/2012    Past Surgical History  Procedure Laterality Date  . Appendectomy    . Tubal ligation    . Intrauterine ablation    . Bladder sling/mesh    . Nasal sinus surgery     Family History  Problem Relation Age of Onset  . Depression Mother   . Diabetes Father   . Hyperlipidemia Father   . Hypertension Father   . Depression Sister   . Depression Son   . Breast cancer Maternal Grandmother     Social History   Social History  . Marital Status: Married    Spouse Name: N/A  . Number of Children: N/A  . Years of Education: N/A   Occupational History  . Not on file.   Social History Main Topics  . Smoking status: Current Every Day Smoker  . Smokeless tobacco: Not on file  . Alcohol Use: Not on file  . Drug Use: Not on file  . Sexual Activity: Not on  file   Other Topics Concern  . Not on file   Social History Narrative     Objective: BP 134/81 mmHg  Pulse 67  Wt 200 lb (90.719 kg)  Vital signs reviewed. General: Alert and Oriented, No Acute Distress HEENT: Pupils equal, round, reactive to light. Conjunctivae clear.  External ears unremarkable.  Moist mucous membranes. Lungs: Clear and comfortable work of breathing, speaking in full sentences without accessory muscle use. Cardiac: Regular rate and rhythm.  Neuro: CN II-XII grossly intact, gait normal. Extremities: No peripheral edema.  Strong peripheral pulses.  Mental Status: No depression, anxiety, nor agitation. Logical though process. Skin: Warm and dry. In the right inguinal region she has a tender soft mass that feels almost like half of a hotdog underneath the skin. No overlying skin changes.  Assessment & Plan: Creina was seen today for groin pain.  Diagnoses and all orders for this visit:  Right inguinal hernia -     Ambulatory referral to General Surgery -     oxyCODONE-acetaminophen (ROXICET) 5-325 MG tablet; Take 1 tablet by mouth every 8 (eight) hours as needed for moderate pain or severe pain.  Hematuria -     Urine culture   Urinalysis was obtained due to septicemia in the past without any urinary symptoms. She has some moderate blood in her urine and a culture has been  sent, no antibiotics until culture returns or new symptoms occur. Urgent surgical consultation has been arranged for Monday at 1:15. Start Percocet as needed for pain.Signs and symptoms requring emergent/urgent reevaluation were discussed with the patient. Avoid heavy lifting.  No Follow-up on file.

## 2015-07-12 LAB — URINE CULTURE
COLONY COUNT: NO GROWTH
Organism ID, Bacteria: NO GROWTH

## 2015-07-14 DIAGNOSIS — K4090 Unilateral inguinal hernia, without obstruction or gangrene, not specified as recurrent: Secondary | ICD-10-CM | POA: Insufficient documentation

## 2015-07-21 ENCOUNTER — Encounter: Payer: Self-pay | Admitting: Family Medicine

## 2016-01-04 ENCOUNTER — Ambulatory Visit (INDEPENDENT_AMBULATORY_CARE_PROVIDER_SITE_OTHER): Payer: 59 | Admitting: Osteopathic Medicine

## 2016-01-04 ENCOUNTER — Encounter: Payer: Self-pay | Admitting: Osteopathic Medicine

## 2016-01-04 VITALS — BP 137/59 | HR 73 | Ht 65.0 in | Wt 185.0 lb

## 2016-01-04 DIAGNOSIS — E785 Hyperlipidemia, unspecified: Secondary | ICD-10-CM | POA: Diagnosis not present

## 2016-01-04 DIAGNOSIS — F411 Generalized anxiety disorder: Secondary | ICD-10-CM | POA: Diagnosis not present

## 2016-01-04 DIAGNOSIS — F988 Other specified behavioral and emotional disorders with onset usually occurring in childhood and adolescence: Secondary | ICD-10-CM | POA: Diagnosis not present

## 2016-01-04 DIAGNOSIS — F329 Major depressive disorder, single episode, unspecified: Secondary | ICD-10-CM | POA: Diagnosis not present

## 2016-01-04 DIAGNOSIS — F32A Depression, unspecified: Secondary | ICD-10-CM

## 2016-01-04 MED ORDER — ESCITALOPRAM OXALATE 20 MG PO TABS: 20.0000 mg | ORAL_TABLET | Freq: Every day | ORAL | 1 refills | Status: DC

## 2016-01-04 MED ORDER — AMPHETAMINE-DEXTROAMPHETAMINE 10 MG PO TABS: 10.0000 mg | ORAL_TABLET | Freq: Two times a day (BID) | ORAL | 0 refills | Status: DC

## 2016-01-04 NOTE — Progress Notes (Signed)
HPI: Sarah Nicholson is a 45 y.o. female  who presents to [place] today, 01/04/16,  for chief complaint of:  Chief Complaint  Patient presents with  . Establish Care     Swithcing from [facility]/ annual exam     Would like to restart Lexapro and Adderall. Lexapro only antidepressant used, about 20 years total. This is the only antidepressant she has been on but thinks per has was on Ativan in the past when she was getting shaky, having anxiety problems. Wellbutrin in the past when trying to quit smoking but it didn't help. No other mood stabilizers or other antidepressant/antianxiety medications. No history of bipolar disorder. Increased anxiety issues since one of her children recently came out as transgender, she is supportive of the transition however is concerned about other mental health issues in this child and is currently estranged.    Past medical, surgical, social and family history reviewed: Past Medical History:  Diagnosis Date  . ADD (attention deficit disorder) 06/14/2012  . Depression 06/14/2012  . History of leukocytosis 06/14/2012   Reports "WBC 32" decades ago with negative hematology specialist workup.   [name] Hyperlipidemia 06/14/2012  . Prediabetes 06/14/2012  . Seasonal allergies 06/14/2012   Past Surgical History:  Procedure Laterality Date  . APPENDECTOMY    . bladder sling/mesh    . INGUINAL HERNIA REPAIR    . intrauterine ablation    . NASAL SINUS SURGERY    . TUBAL LIGATION     Social History  Substance Use Topics  . Smoking status: Current Every Day Smoker  . Smokeless tobacco: Never Used  . Alcohol use Not on file   Family History  Problem Relation Age of Onset  . Depression Mother   . Diabetes Father   . Hyperlipidemia Father   . Hypertension Father   . Depression Sister   . Depression Son   . Breast cancer Maternal Grandmother      Current medication list and allergy/intolerance information reviewed:   No  current outpatient prescriptions on file.   No current facility-administered medications for this visit.    Allergies  Allergen Reactions  . Macrobid [Nitrofurantoin Macrocrystal] Hives  . Tetanus Toxoids Swelling    Pt states she was told the Tdap injection would have to be administered in the hospital  After discussion on 11/10 pt remembers being told it was thimersol in the tetanus 30+years ago that caused the swelling.       Review of Systems:  Constitutional:  No  fever, no chills, No recent illness, No unintentional weight changes. +significant fatigue.   HEENT: No  headache, no vision change  Cardiac: No  chest pain, No  pressure, No palpitations,  Respiratory:  No  shortness of breath. No  Cough  Gastrointestinal: No  abdominal pain, No  nausea, No  vomiting  Musculoskeletal: No new myalgia/arthralgia  Skin: No  Rash  Neurologic: No  weakness, No  dizziness  Psychiatric: +concerns with depression, +concerns with anxiety, No sleep problems, No mood problems  Exam:  BP (!) 137/59   Pulse 73   Ht 5\' 5"  (1.651 m)   Wt 185 lb (83.9 kg)   BMI 30.79 kg/m   Constitutional: VS see above. General Appearance: alert, well-developed, well-nourished, NAD  Eyes: Normal lids and conjunctive, non-icteric sclera  Ears, Nose, Mouth, Throat: MMM, Normal external inspection ears/nares/mouth/lips/gums. TM normal bilaterally. Pharynx/tonsils no erythema, no exudate. Nasal mucosa normal. PERRLA  Neck: No masses, trachea midline. No thyroid  enlargement. No tenderness/mass appreciated. No lymphadenopathy  Respiratory: Normal respiratory effort. no wheeze, no rhonchi, no rales  Cardiovascular: S1/S2 normal, no murmur, no rub/gallop auscultated. RRR. No lower extremity edema.   Gastrointestinal: Nontender, no masses. No hepatomegaly, no splenomegaly. No hernia appreciated. Bowel sounds normal. Rectal exam deferred.   Musculoskeletal: Gait normal. No clubbing/cyanosis of digits.    Neurological: Normal balance/coordination. No tremor.   Skin: warm, dry, intact.  Psychiatric: Normal judgment/insight. Normal mood and affect. Oriented x3. No SI/HI   No results found for this or any previous visit (from the past 72 hour(s)).  No results found.   ASSESSMENT/PLAN: Restart Lexapro, patient tolerated this well last time but option to start a half tablet for 1 week. Routine labs as below. Adderall refill 3 months, patient takes this as needed so she states a 3 month supply typically will last her 9 months or more. Plan to follow-up in 4-6 weeks for annual physical/brief recheck of anxiety/depression, consider augmentation with Wellbutrin or mood stabilizer.   Generalized anxiety disorder - Plan: escitalopram (LEXAPRO) 20 MG tablet, TSH  Depression, unspecified depression type - Plan: escitalopram (LEXAPRO) 20 MG tablet, CBC with Differential/Platelet, COMPLETE METABOLIC PANEL WITH GFR, TSH  Attention deficit disorder, unspecified hyperactivity presence - Plan: amphetamine-dextroamphetamine (ADDERALL) 10 MG tablet, amphetamine-dextroamphetamine (ADDERALL) 10 MG tablet, DISCONTINUED: amphetamine-dextroamphetamine (ADDERALL) 10 MG tablet  Hyperlipidemia, unspecified hyperlipidemia type - Plan: Lipid panel, TSH     Visit summary with medication list and pertinent instructions was printed for patient to review. All questions at time of visit were answered - patient instructed to contact office with any additional concerns. ER/RTC precautions were reviewed with the patient. Follow-up plan: Return in about 4 weeks (around 02/01/2016) for anxiety followup if not well-controlled after restartimg medicatoins.

## 2016-01-05 ENCOUNTER — Encounter: Payer: Self-pay | Admitting: Osteopathic Medicine

## 2016-01-05 LAB — LIPID PANEL
CHOL/HDL RATIO: 6.8 ratio — AB (ref <=5.0)
Cholesterol: 216 mg/dL — ABNORMAL HIGH (ref 125–200)
HDL: 32 mg/dL — ABNORMAL LOW (ref >=46)
LDL CALC: 161 mg/dL — AB (ref <130)
TRIGLYCERIDES: 113 mg/dL (ref <150)
VLDL: 23 mg/dL (ref <30)

## 2016-01-05 LAB — COMPLETE METABOLIC PANEL WITH GFR
ALT: 9 U/L (ref 6–29)
AST: 11 U/L (ref 10–35)
Albumin: 4 g/dL (ref 3.6–5.1)
Alkaline Phosphatase: 63 U/L (ref 33–115)
BUN: 13 mg/dL (ref 7–25)
CHLORIDE: 108 mmol/L (ref 98–110)
CO2: 25 mmol/L (ref 20–31)
CREATININE: 0.84 mg/dL (ref 0.50–1.10)
Calcium: 8.9 mg/dL (ref 8.6–10.2)
GFR, Est African American: >89 mL/min (ref >=60)
GFR, Est Non African American: 83 mL/min (ref >=60)
Glucose, Bld: 82 mg/dL (ref 65–99)
POTASSIUM: 4.4 mmol/L (ref 3.5–5.3)
SODIUM: 141 mmol/L (ref 135–146)
Total Bilirubin: 0.4 mg/dL (ref 0.2–1.2)
Total Protein: 6.6 g/dL (ref 6.1–8.1)

## 2016-01-05 LAB — CBC WITH DIFFERENTIAL/PLATELET
BASOS PCT: 0 %
Basophils Absolute: 0 cells/uL (ref 0–200)
EOS PCT: 1 %
Eosinophils Absolute: 106 cells/uL (ref 15–500)
HCT: 43.7 % (ref 35.0–45.0)
Hemoglobin: 14.5 g/dL (ref 11.7–15.5)
LYMPHS PCT: 24 %
Lymphs Abs: 2544 cells/uL (ref 850–3900)
MCH: 29.9 pg (ref 27.0–33.0)
MCHC: 33.2 g/dL (ref 32.0–36.0)
MCV: 90.1 fL (ref 80.0–100.0)
MONO ABS: 636 {cells}/uL (ref 200–950)
MONOS PCT: 6 %
MPV: 9.4 fL (ref 7.5–12.5)
Neutro Abs: 7314 cells/uL (ref 1500–7800)
Neutrophils Relative %: 69 %
PLATELETS: 353 10*3/uL (ref 140–400)
RBC: 4.85 MIL/uL (ref 3.80–5.10)
RDW: 13 % (ref 11.0–15.0)
WBC: 10.6 10*3/uL (ref 3.8–10.8)

## 2016-01-05 LAB — TSH: TSH: 1.08 mIU/L

## 2016-02-09 ENCOUNTER — Ambulatory Visit (INDEPENDENT_AMBULATORY_CARE_PROVIDER_SITE_OTHER): Payer: 59 | Admitting: Osteopathic Medicine

## 2016-02-09 ENCOUNTER — Encounter: Payer: Self-pay | Admitting: Osteopathic Medicine

## 2016-02-09 VITALS — BP 114/56 | HR 81 | Temp 98.3°F | Ht 65.0 in | Wt 186.0 lb

## 2016-02-09 DIAGNOSIS — F411 Generalized anxiety disorder: Secondary | ICD-10-CM

## 2016-02-09 DIAGNOSIS — F329 Major depressive disorder, single episode, unspecified: Secondary | ICD-10-CM

## 2016-02-09 DIAGNOSIS — F988 Other specified behavioral and emotional disorders with onset usually occurring in childhood and adolescence: Secondary | ICD-10-CM

## 2016-02-09 DIAGNOSIS — K2970 Gastritis, unspecified, without bleeding: Secondary | ICD-10-CM

## 2016-02-09 DIAGNOSIS — F32A Depression, unspecified: Secondary | ICD-10-CM

## 2016-02-09 NOTE — Progress Notes (Signed)
HPI: Sarah Nicholson is a 45 y.o. female  who presents to [place] today, 02/09/16,  for chief complaint of:  Chief Complaint  Patient presents with  . Follow-up    ANXIETY  . Diarrhea    BLOATING AND CRAMPING    GI problem . Context: Concerned that right inguinal hernia may be causing another problem . Location/quality: Lower abdominal cramping, diarrhea/loose stool multiple times per day . Duration: Ongoing 5-6 days but improving . Timing: Intermittent . Modifying factors: Imodium . Assoc signs/symptoms: Elevated temperature at home yesterday, no blood in the stool  Anxiety: restarted Lexapro last viist, pt previously on Wellbutrin alone w/o effect for smoking cessation. Also on Adderall 10 mg. doing well on the medication, requests referral for counseling/therapy    Past medical, surgical, social and family history reviewed: Patient Active Problem List   Diagnosis Date Noted  . Hallux valgus 12/09/2014  . Other and unspecified ovarian cyst 06/25/2013  . Left tennis elbow 06/25/2013  . Left breast mass 06/27/2012  . Microscopic hematuria 06/27/2012  . Hyperlipidemia 06/14/2012  . Prediabetes 06/14/2012  . History of leukocytosis 06/14/2012  . Seasonal allergies 06/14/2012  . Depression 06/14/2012  . ADD (attention deficit disorder) 06/14/2012   Past Surgical History:  Procedure Laterality Date  . APPENDECTOMY    . bladder sling/mesh    . INGUINAL HERNIA REPAIR    . intrauterine ablation    . NASAL SINUS SURGERY    . TUBAL LIGATION     Social History  Substance Use Topics  . Smoking status: Current Every Day Smoker  . Smokeless tobacco: Never Used  . Alcohol use Not on file   Family History  Problem Relation Age of Onset  . Depression Mother   . Diabetes Father   . Hyperlipidemia Father   . Hypertension Father   . Depression Sister   . Depression Son   . Breast cancer Maternal Grandmother      Current  medication list and allergy/intolerance information reviewed:   Current Outpatient Prescriptions on File Prior to Visit  Medication Sig Dispense Refill  . amphetamine-dextroamphetamine (ADDERALL) 10 MG tablet Take 1 tablet (10 mg total) by mouth 2 (two) times daily. Fill after 02/04/16 60 tablet 0  . [START ON 03/05/2016] amphetamine-dextroamphetamine (ADDERALL) 10 MG tablet Take 1 tablet (10 mg total) by mouth 2 (two) times daily. Fill on or after 03/05/16 60 tablet 0  . escitalopram (LEXAPRO) 20 MG tablet Take 1 tablet (20 mg total) by mouth daily. 30 tablet 1   No current facility-administered medications on file prior to visit.    Allergies  Allergen Reactions  . Macrobid [Nitrofurantoin Macrocrystal] Hives  . Tetanus Toxoids Swelling    Pt states she was told the Tdap injection would have to be administered in the hospital  After discussion on 11/10 pt remembers being told it was thimersol in the tetanus 30+years ago that caused the swelling.       Review of Systems:  Constitutional: No recent illness  HEENT: No  headache, no vision change  Cardiac: No  chest pain, No  pressure, No palpitations  Respiratory:  No  shortness of breath. No  Cough  Gastrointestinal: No  abdominal pain, no change on bowel habits  Musculoskeletal: No new myalgia/arthralgia  Skin: No  Rash  Hem/Onc: No  easy bruising/bleeding, No  abnormal lumps/bumps  Neurologic: No  weakness, No  Dizziness  Psychiatric: No  concerns with depression, No  concerns with anxiety  Exam:  BP (!) 114/56   Pulse 81   Ht 5\' 5"  (1.651 m)   Wt 186 lb (84.4 kg)   BMI 30.95 kg/m   Constitutional: VS see above. General Appearance: alert, well-developed, well-nourished, NAD  Eyes: Normal lids and conjunctive, non-icteric sclera  Ears, Nose, Mouth, Throat: MMM, Normal external inspection ears/nares/mouth/lips/gums.  Neck: No masses, trachea midline.   Respiratory: Normal respiratory effort. no wheeze, no  rhonchi, no rales  Cardiovascular: S1/S2 normal, no murmur, no rub/gallop auscultated. RRR.   Musculoskeletal: Gait normal. Symmetric and independent movement of all extremities  Gastrointestinal: Bowel sounds normal 4. No rebound or guarding. No hepatomegaly. No appreciable hernia  Neurological: Normal balance/coordination. No tremor.  Skin: warm, dry, intact.   Psychiatric: Normal judgment/insight. Normal mood and affect. Oriented x3.     ASSESSMENT/PLAN:   Viral gastritis - Improving after about 5 days or so, think most likely viral, consider CT/colitis treatment if worsens  Depression, unspecified depression type - Plan: Ambulatory referral to Psychology  Generalized anxiety disorder - Plan: Ambulatory referral to Psychology  Attention deficit disorder, unspecified hyperactivity presence - Plan: Ambulatory referral to Psychology    Patient Instructions   Plan to stay on the Lexapro for 3 - 6 months at least.   Referral placed for behavioral health - call us if they haven't contacted you to schedule an appointment by the end of this week  I think GI symptoms are more related to viral gastritis, but if pain/diarrhea recur or worsen please let me know ASAP!     Visit summary with medication list and pertinent instructions was printed for patient to review. All questions at time of visit were answered - patient instructed to contact office with any additional concerns. ER/RTC precautions were reviewed with the patient. Follow-up plan: Return in about 3 months (around 05/11/2016) for anxiety/ADHD follow-up, sooner if needed.

## 2016-02-09 NOTE — Patient Instructions (Signed)
   Plan to stay on the Lexapro for 3 - 6 months at least.   Referral placed for behavioral health - call us if they haven't contacted you to schedule an appointment by the end of this week  I think GI symptoms are more related to viral gastritis, but if pain/diarrhea recur or worsen please let me know ASAP!

## 2016-03-15 ENCOUNTER — Ambulatory Visit (INDEPENDENT_AMBULATORY_CARE_PROVIDER_SITE_OTHER): Payer: 59 | Admitting: Licensed Clinical Social Worker

## 2016-03-15 DIAGNOSIS — F900 Attention-deficit hyperactivity disorder, predominantly inattentive type: Secondary | ICD-10-CM | POA: Diagnosis not present

## 2016-03-15 DIAGNOSIS — F411 Generalized anxiety disorder: Secondary | ICD-10-CM

## 2016-03-15 DIAGNOSIS — F331 Major depressive disorder, recurrent, moderate: Secondary | ICD-10-CM

## 2016-03-15 DIAGNOSIS — F4010 Social phobia, unspecified: Secondary | ICD-10-CM

## 2016-03-16 ENCOUNTER — Encounter (HOSPITAL_COMMUNITY): Payer: Self-pay | Admitting: Licensed Clinical Social Worker

## 2016-03-16 DIAGNOSIS — F331 Major depressive disorder, recurrent, moderate: Secondary | ICD-10-CM | POA: Insufficient documentation

## 2016-03-16 DIAGNOSIS — F4010 Social phobia, unspecified: Secondary | ICD-10-CM | POA: Insufficient documentation

## 2016-03-16 DIAGNOSIS — F411 Generalized anxiety disorder: Secondary | ICD-10-CM | POA: Insufficient documentation
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
## 2016-04-04 ENCOUNTER — Ambulatory Visit (INDEPENDENT_AMBULATORY_CARE_PROVIDER_SITE_OTHER): Payer: 59 | Admitting: Licensed Clinical Social Worker

## 2016-04-04 DIAGNOSIS — F331 Major depressive disorder, recurrent, moderate: Secondary | ICD-10-CM

## 2016-04-04 DIAGNOSIS — F4010 Social phobia, unspecified: Secondary | ICD-10-CM

## 2016-04-04 DIAGNOSIS — F411 Generalized anxiety disorder: Secondary | ICD-10-CM | POA: Diagnosis not present

## 2016-04-04 DIAGNOSIS — F900 Attention-deficit hyperactivity disorder, predominantly inattentive type: Secondary | ICD-10-CM

## 2016-04-04 NOTE — Progress Notes (Signed)
   THERAPIST PROGRESS NOTE  Session Time: 11:10am-11:55am  Participation Level: Active  Behavioral Response: CasualAlertAnxious and a bit tearful  Type of Therapy: Individual Therapy  Treatment Goals addressed: Developed treatment plan today- increase participation and enjoyment of activities despite social anxiety, decrease rumination  Interventions: Treatment planning  Suicidal/Homicidal: Denied both  Therapist Interventions: Collaborated with patient to develop her treatment plan.  Identified strengths, preferences, and treatment goals.  Briefly described interventions patient can expect as she participates in therapy.    Summary: Developed the following treatment goals: Jayle will report engaging in activities despite her fear of being judged in a negative way.  She will be able to get some enjoyment from these experiences as well.        Gizela will report a significant decrease in dwelling on the past and worrying about the future.    Was able to describe a tightness she feels in her body when she experiences social anxiety.  Acknowledged there are times when she uses taking a smoke break as a socially acceptable way to escape from situations that provoke social anxiety.   Reflected on how she often gets caught up in unhelpful thinking patterns when she is overly self-critical and tries to work out in her head how she can fix her problems.    Plan: Scheduled to return in two weeks.  May focus on psycho-education about anxiety.  Diagnosis: Social Anxiety Disorder                          Generalized Anxiety Disorder                          Major Depressive Disorder                          ADHD   Armandina Stammer 04/04/2016

## 2016-04-17 ENCOUNTER — Encounter: Payer: Self-pay | Admitting: Emergency Medicine

## 2016-04-17 ENCOUNTER — Emergency Department (INDEPENDENT_AMBULATORY_CARE_PROVIDER_SITE_OTHER)
Admission: EM | Admit: 2016-04-17 | Discharge: 2016-04-17 | Disposition: A | Discharge status: Home / Self Care | Payer: 59 | Source: Home / Self Care | Attending: Family Medicine | Admitting: Family Medicine

## 2016-04-17 DIAGNOSIS — J111 Influenza due to unidentified influenza virus with other respiratory manifestations: Secondary | ICD-10-CM

## 2016-04-17 DIAGNOSIS — R69 Illness, unspecified: Secondary | ICD-10-CM | POA: Diagnosis not present

## 2016-04-17 MED ORDER — OSELTAMIVIR PHOSPHATE 75 MG PO CAPS: 75.0000 mg | ORAL_CAPSULE | Freq: Two times a day (BID) | ORAL | 0 refills | Status: DC

## 2016-04-17 NOTE — Discharge Instructions (Signed)
Take plain guaifenesin (1200mg  extended release tabs such as Mucinex) twice daily, with plenty of water, for cough and congestion (May replace bedtime dose with Mucinex DM)   Get adequate rest.   For sinus congestion may use Afrin nasal spray (or generic oxymetazoline) twice daily for about 5 days and then discontinue.  Also recommend using saline nasal spray several times daily and saline nasal irrigation (AYR is a common brand).  Use Flonase nasal spray each morning after using Afrin nasal spray and saline nasal irrigation. Try warm salt water gargles for sore throat.  Stop all antihistamines for now, and other non-prescription cough/cold preparations. May take Ibuprofen 200mg , 4 tabs every 8 hours with food for chest/sternum discomfort, body aches, fever, etc.

## 2016-04-17 NOTE — ED Triage Notes (Signed)
Pt c/o possible flu, fever, body chills, non-productive cough that started yesterday.

## 2016-04-17 NOTE — ED Provider Notes (Signed)
[facility]    CSN: [id number] Arrival date & time: 04/17/16  1250     History   Chief Complaint Chief Complaint  Patient presents with  . URI    HPI Sarah Nicholson is a 46 y.o. female.   Patient developed a non productive cough and headache yesterday.  At 4am today she developed fever to 102.2, chills, fatigue, myalgias, and pressure in her anterior chest.   The history is provided by the patient.    Past Medical History:  Diagnosis Date  . ADD (attention deficit disorder) 06/14/2012  . Depression 06/14/2012  . History of leukocytosis 06/14/2012   Reports "WBC 32" decades ago with negative hematology specialist workup.   [name] Hyperlipidemia 06/14/2012  . Prediabetes 06/14/2012  . Seasonal allergies 06/14/2012    Patient Active Problem List   Diagnosis Date Noted  . Generalized anxiety disorder 03/16/2016  . Social anxiety disorder 03/16/2016  . Major depressive disorder, recurrent episode, moderate ([place]) 03/16/2016  . Hallux valgus 12/09/2014  . Other and unspecified ovarian cyst 06/25/2013  . Left tennis elbow 06/25/2013  . Left breast mass 06/27/2012  . Microscopic hematuria 06/27/2012  . Hyperlipidemia 06/14/2012  . Prediabetes 06/14/2012  . History of leukocytosis 06/14/2012  . Seasonal allergies 06/14/2012  . Depression 06/14/2012  . ADD (attention deficit disorder) 06/14/2012    Past Surgical History:  Procedure Laterality Date  . APPENDECTOMY    . bladder sling/mesh    . INGUINAL HERNIA REPAIR    . intrauterine ablation    . NASAL SINUS SURGERY    . TUBAL LIGATION      OB History    No data available       Home Medications    Prior to Admission medications   Medication Sig Start Date End Date Taking? Authorizing Provider  amphetamine-dextroamphetamine (ADDERALL) 10 MG tablet Take 1 tablet (10 mg total) by mouth 2 (two) times daily. Fill after 02/04/16 02/04/16 03/05/17  Emeterio Reeve, DO  amphetamine-dextroamphetamine (ADDERALL)  10 MG tablet Take 1 tablet (10 mg total) by mouth 2 (two) times daily. Fill on or after 03/05/16 03/05/16 04/05/16  Emeterio Reeve, DO  escitalopram (LEXAPRO) 20 MG tablet Take 1 tablet (20 mg total) by mouth daily. 01/04/16   Emeterio Reeve, DO  oseltamivir (TAMIFLU) 75 MG capsule Take 1 capsule (75 mg total) by mouth every 12 (twelve) hours. 04/17/16   Kandra Nicolas, MD    Family History Family History  Problem Relation Age of Onset  . Depression Mother   . Diabetes Father   . Hyperlipidemia Father   . Hypertension Father   . Depression Sister   . Depression Son   . Breast cancer Maternal Grandmother     Social History Social History  Substance Use Topics  . Smoking status: Current Every Day Smoker  . Smokeless tobacco: Never Used  . Alcohol use Yes     Comment: 1-2 drinks every 6 months     Allergies   Macrobid [nitrofurantoin macrocrystal] and Tetanus toxoids   Review of Systems Review of Systems No sore throat + cough No pleuritic pain No wheezing + nasal congestion No post-nasal drainage No sinus pain/pressure No itchy/red eyes No earache No hemoptysis No SOB + fever, + chills No nausea No vomiting No abdominal pain No diarrhea No urinary symptoms No skin rash + fatigue + myalgias + headache Used OTC meds without relief   Physical Exam Triage Vital Signs ED Triage Vitals  Enc Vitals Group  BP 04/17/16 1447 138/70     Pulse Rate 04/17/16 1447 84     Resp --      Temp 04/17/16 1447 99.3 F (37.4 C)     Temp Source 04/17/16 1447 Oral     SpO2 04/17/16 1447 96 %     Weight 04/17/16 1447 190 lb 8 oz (86.4 kg)     Height 04/17/16 1447 5\' 6"  (1.676 m)     Head Circumference --      Peak Flow --      Pain Score 04/17/16 1449 0     Pain Loc --      Pain Edu? --      Excl. in [place]? --    No data found.   Updated Vital Signs BP 138/70 (BP Location: Left Arm)   Pulse 84   Temp 99.3 F (37.4 C) (Oral)   Ht 5\' 6"  (1.676 m)   Wt 190 lb  8 oz (86.4 kg)   SpO2 96%   BMI 30.75 kg/m   Visual Acuity Right Eye Distance:   Left Eye Distance:   Bilateral Distance:    Right Eye Near:   Left Eye Near:    Bilateral Near:     Physical Exam Nursing notes and Vital Signs reviewed. Appearance:  Patient appears stated age, and in no acute distress Eyes:  Pupils are equal, round, and reactive to light and accomodation.  Extraocular movement is intact.  Conjunctivae are not inflamed  Ears:  Canals normal.  Tympanic membranes normal.  Nose:  Mildly congested turbinates.  No sinus tenderness.   Pharynx:  Normal Neck:  Supple.  Tender enlarged posterior/lateral nodes are palpated bilaterally  Lungs:  Clear to auscultation.  Breath sounds are equal.  Moving air well. Chest:  Distinct tenderness to palpation over the mid-sternum.  Heart:  Regular rate and rhythm without murmurs, rubs, or gallops.  Abdomen:  Nontender without masses or hepatosplenomegaly.  Bowel sounds are present.  No CVA or flank tenderness.  Extremities:  No edema.  Skin:  No rash present.    UC Treatments / Results  Labs (all labs ordered are listed, but only abnormal results are displayed) Labs Reviewed - No data to display  EKG  EKG Interpretation None       Radiology No results found.  Procedures Procedures (including critical care time)  Medications Ordered in UC Medications - No data to display   Initial Impression / Assessment and Plan / UC Course  I have reviewed the triage vital signs and the nursing notes.  Pertinent labs & imaging results that were available during my care of the patient were reviewed by me and considered in my medical decision making (see chart for details).    Begin Tamiflu. Take plain guaifenesin (1200mg  extended release tabs such as Mucinex) twice daily, with plenty of water, for cough and congestion (May replace bedtime dose with Mucinex DM)   Get adequate rest.   For sinus congestion may use Afrin nasal spray  (or generic oxymetazoline) twice daily for about 5 days and then discontinue.  Also recommend using saline nasal spray several times daily and saline nasal irrigation (AYR is a common brand).  Use Flonase nasal spray each morning after using Afrin nasal spray and saline nasal irrigation. Try warm salt water gargles for sore throat.  Stop all antihistamines for now, and other non-prescription cough/cold preparations. May take Ibuprofen 200mg , 4 tabs every 8 hours with food for chest/sternum discomfort, body aches, fever,  etc. Followup with Family Doctor if not improved in 5 days.    Final Clinical Impressions(s) / UC Diagnoses   Final diagnoses:  Influenza-like illness    New Prescriptions New Prescriptions   OSELTAMIVIR (TAMIFLU) 75 MG CAPSULE    Take 1 capsule (75 mg total) by mouth every 12 (twelve) hours.     Kandra Nicolas, MD 04/18/16 [phone]

## 2016-04-18 ENCOUNTER — Ambulatory Visit (HOSPITAL_COMMUNITY): Payer: Self-pay | Admitting: Licensed Clinical Social Worker

## 2016-04-28 ENCOUNTER — Ambulatory Visit (HOSPITAL_COMMUNITY): Payer: Self-pay | Admitting: Licensed Clinical Social Worker

## 2016-05-04 ENCOUNTER — Other Ambulatory Visit: Payer: Self-pay | Admitting: Osteopathic Medicine

## 2016-05-04 DIAGNOSIS — F329 Major depressive disorder, single episode, unspecified: Secondary | ICD-10-CM

## 2016-05-04 DIAGNOSIS — F32A Depression, unspecified: Secondary | ICD-10-CM

## 2016-05-04 DIAGNOSIS — F411 Generalized anxiety disorder: Secondary | ICD-10-CM

## 2016-05-16 ENCOUNTER — Ambulatory Visit (INDEPENDENT_AMBULATORY_CARE_PROVIDER_SITE_OTHER): Payer: PRIVATE HEALTH INSURANCE | Admitting: Licensed Clinical Social Worker

## 2016-05-16 DIAGNOSIS — F900 Attention-deficit hyperactivity disorder, predominantly inattentive type: Secondary | ICD-10-CM | POA: Diagnosis not present

## 2016-05-16 DIAGNOSIS — F411 Generalized anxiety disorder: Secondary | ICD-10-CM

## 2016-05-16 DIAGNOSIS — F4010 Social phobia, unspecified: Secondary | ICD-10-CM | POA: Diagnosis not present

## 2016-05-16 DIAGNOSIS — F331 Major depressive disorder, recurrent, moderate: Secondary | ICD-10-CM | POA: Diagnosis not present

## 2016-05-16 NOTE — Progress Notes (Signed)
   THERAPIST PROGRESS NOTE  Session Time: [id number]  Participation Level: Active  Behavioral Response: CasualAlertAnxious and a bit tearful  Type of Therapy: Individual Therapy  Treatment Goals addressed: Increase participation and enjoyment of activities despite social anxiety, decrease rumination  Interventions: Psycho-ed about anxiety, assessing severity of social anxiety  Suicidal/Homicidal: Denied both  Therapist Interventions: Gathered information about significant events and changes in mood and functioning since last seen in January.   Had patient complete a Henderson-Zimbardo Shyness Questionnaire [facility]) to assess the severity of her social anxiety.   Reviewed Three Cycles of Shyness and Social Anxiety.  Cycle 1 involves negative predictions, activation of the fight or flight response, and avoidance. Cycle 2 involves shame and self-blame regarding the avoidance or withdrawal.  Cycle 3 involves turning self-blame into blame on others and this often leads to feelings of anger and resentment.      Summary: Reported "The last two months have really been the same."  Continues to experience a significant amount of social anxiety. Noted she will be leaving for a visit to [place] soon.  Planning to see her grandfather whom she is hoping to convince to move to [facility]. Score on the [facility] was 4.1  A score of 3.5 or greater indicates the presence of social anxiety.  Already familiar with the concept of fight or flight.  Commented on how she could relate well to the cycles of social anxiety described.  Was able to identify different types of social situations she avoids or endures with a lot of anxiety.            Plan: Next time focus on relaxation training and/or mindfulness    Diagnosis: Social Anxiety Disorder                          Generalized Anxiety Disorder                          Major Depressive Disorder                          ADHD   Garnette Scheuermann,  LCSW 05/16/2016

## 2016-06-01 ENCOUNTER — Ambulatory Visit (INDEPENDENT_AMBULATORY_CARE_PROVIDER_SITE_OTHER): Payer: PRIVATE HEALTH INSURANCE | Admitting: Licensed Clinical Social Worker

## 2016-06-01 DIAGNOSIS — F900 Attention-deficit hyperactivity disorder, predominantly inattentive type: Secondary | ICD-10-CM | POA: Diagnosis not present

## 2016-06-01 DIAGNOSIS — F4010 Social phobia, unspecified: Secondary | ICD-10-CM | POA: Diagnosis not present

## 2016-06-01 DIAGNOSIS — F411 Generalized anxiety disorder: Secondary | ICD-10-CM | POA: Diagnosis not present

## 2016-06-01 DIAGNOSIS — F331 Major depressive disorder, recurrent, moderate: Secondary | ICD-10-CM

## 2016-06-01 NOTE — Progress Notes (Signed)
   THERAPIST PROGRESS NOTE  Session Time: [id number]  Participation Level: Active  Behavioral Response: Casual Alert Anxious  Type of Therapy: Individual Therapy  Treatment Goals addressed: decrease rumination  Interventions: Mindfulness  Suicidal/Homicidal: Denied both  Therapist Interventions:   Introduced the concept of mindfulness.  Emphasized how learning to focus on the present can help you to feel more in control of your emotions.  Explained how it can be useful to practice at times when you catch yourself having unhelpful thoughts.  Described how you can choose to do tasks in a mindful way.  Guided patient through practicing mindfulness in different ways including focusing on an object and mindful breathing.  Encouraged patient to practice the skills regularly in addition to times of distress.   Summary: Visited her family in [place].  Talked to grandfather about options for his care.  She offered to move back to [place] in order to care for him.  He is not taking her up on the offer, but she is glad she "planted the seed" to get him to consider it in the future. Seemed to understand concepts discussed today.  Commented on how she anticipates practicing mindfulness to be especially challenging for her.  Agreed to start with the breathing exercise.             Plan: Return in 2-3 weeks.  Will check on implementation of mindfulness techniques.   Diagnosis: Social Anxiety Disorder                          Generalized Anxiety Disorder                          Major Depressive Disorder                          ADHD   Armandina Stammer 06/01/2016

## 2016-06-21 ENCOUNTER — Ambulatory Visit (INDEPENDENT_AMBULATORY_CARE_PROVIDER_SITE_OTHER): Payer: 59 | Admitting: Osteopathic Medicine

## 2016-06-21 ENCOUNTER — Ambulatory Visit (INDEPENDENT_AMBULATORY_CARE_PROVIDER_SITE_OTHER): Payer: PRIVATE HEALTH INSURANCE | Admitting: Licensed Clinical Social Worker

## 2016-06-21 VITALS — BP 138/57 | HR 77 | Temp 98.0°F | Wt 192.0 lb

## 2016-06-21 DIAGNOSIS — F4010 Social phobia, unspecified: Secondary | ICD-10-CM | POA: Diagnosis not present

## 2016-06-21 DIAGNOSIS — F411 Generalized anxiety disorder: Secondary | ICD-10-CM

## 2016-06-21 DIAGNOSIS — F900 Attention-deficit hyperactivity disorder, predominantly inattentive type: Secondary | ICD-10-CM

## 2016-06-21 DIAGNOSIS — F988 Other specified behavioral and emotional disorders with onset usually occurring in childhood and adolescence: Secondary | ICD-10-CM

## 2016-06-21 DIAGNOSIS — F331 Major depressive disorder, recurrent, moderate: Secondary | ICD-10-CM | POA: Diagnosis not present

## 2016-06-21 DIAGNOSIS — F329 Major depressive disorder, single episode, unspecified: Secondary | ICD-10-CM | POA: Diagnosis not present

## 2016-06-21 DIAGNOSIS — F32A Depression, unspecified: Secondary | ICD-10-CM

## 2016-06-21 MED ORDER — ESCITALOPRAM OXALATE 20 MG PO TABS: 20.0000 mg | ORAL_TABLET | Freq: Every day | ORAL | 3 refills | Status: DC

## 2016-06-21 MED ORDER — AMPHETAMINE-DEXTROAMPHETAMINE 10 MG PO TABS: 10.0000 mg | ORAL_TABLET | Freq: Two times a day (BID) | ORAL | 0 refills | Status: DC

## 2016-06-21 NOTE — Progress Notes (Signed)
   THERAPIST PROGRESS NOTE  Session Time: 45:99HF-41:42LT  Participation Level: Active  Behavioral Response: Casual Alert Anxious, but hopeful  Type of Therapy: Individual Therapy  Treatment Goals addressed: decrease rumination  Interventions: Mindfulness  Suicidal/Homicidal: Denied both  Therapist Interventions:  Discussed how patient has been coping with distressing thoughts and feelings since she discovered her husband has still been communicating with a woman in [place].  Provided positive feedback regarding how she has been attuned to her feelings more and made wise decisions about waiting to respond until her distress level decreases.  Praised her for incorporating mindfulness into her daily life.   Assisted patient with finding a potential couples counselor by doing a Clinical biochemist.    Summary: When she first learned of emails between her husband and the woman she gathered up his belongings and took them to his mom's house.  She told him she didn't want to talk to him for 48 hours.  After some time she engaged him in a conversation about what she needed from him if he wanted their marriage to continue.  She requested that he call the woman in her presence and tell her he would not be contacting her anymore.  Also requested that he see a therapist.  He agreed.  Met with a therapist but after the second session was told he didn't need to return.  Patient told him this was not acceptable and insisted they find a couples therapist.  Again he agreed. Described how she has been practicing mindfulness in different ways.  Noted she has been sleeping better since she started listening to a sleep meditation just prior to going to bed.  Talked about how she has been working on keeping her thoughts focused on the present rather than dwell on the past or worry about the future.        Plan: Return in approximately 2 weeks.    Diagnosis:                           Generalized Anxiety Disorder                          Social Anxiety Disorder                          Major Depressive Disorder                          ADHD   Armandina Stammer 06/21/2016

## 2016-06-21 NOTE — Progress Notes (Signed)
HPI: Sarah Nicholson is a 46 y.o. female  who presents to [facility] today, 06/21/16,  for chief complaint of:  Chief Complaint  Patient presents with  . Follow-up    Meds    Psych: last visit restarted Lexapro to titrate up and planned to follow up in 4-6 weeks for recheck. Pt is overdue for follow-up for medication check. Recently seen by counselor Rica Records - working on mindfulness and relaxation techniques. She is doing well on the medication and would like to continue. Recent marital problems have been a source of stress. Attention deficit well controlled on current medications, patient takes these occasionally twice per day but typically once a day or as needed.    Past medical history, surgical history, social history and family history reviewed.  Patient Active Problem List   Diagnosis Date Noted  . Generalized anxiety disorder 03/16/2016  . Social anxiety disorder 03/16/2016  . Major depressive disorder, recurrent episode, moderate ([place]) 03/16/2016  . Hallux valgus 12/09/2014  . Other and unspecified ovarian cyst 06/25/2013  . Left tennis elbow 06/25/2013  . Left breast mass 06/27/2012  . Microscopic hematuria 06/27/2012  . Hyperlipidemia 06/14/2012  . Prediabetes 06/14/2012  . History of leukocytosis 06/14/2012  . Seasonal allergies 06/14/2012  . Depression 06/14/2012  . ADD (attention deficit disorder) 06/14/2012    Current medication list and allergy/intolerance information reviewed.   Current Outpatient Prescriptions on File Prior to Visit  Medication Sig Dispense Refill  . amphetamine-dextroamphetamine (ADDERALL) 10 MG tablet Take 1 tablet (10 mg total) by mouth 2 (two) times daily. Fill after 02/04/16 60 tablet 0  . amphetamine-dextroamphetamine (ADDERALL) 10 MG tablet Take 1 tablet (10 mg total) by mouth 2 (two) times daily. Fill on or after 03/05/16 60 tablet 0  . escitalopram (LEXAPRO) 20 MG tablet Take 1 tablet (20  mg total) by mouth daily. MUST SCHEDULED APPOINTMENT FOR FURTHER REFILLS 30 tablet 0  . oseltamivir (TAMIFLU) 75 MG capsule Take 1 capsule (75 mg total) by mouth every 12 (twelve) hours. 10 capsule 0   No current facility-administered medications on file prior to visit.    Allergies  Allergen Reactions  . Macrobid [Nitrofurantoin Macrocrystal] Hives  . Tetanus Toxoids Swelling    Pt states she was told the Tdap injection would have to be administered in the hospital  After discussion on 11/10 pt remembers being told it was thimersol in the tetanus 30+years ago that caused the swelling.       Review of Systems:  Constitutional: No recent illness  HEENT: No  headache, no vision change  Cardiac: No  chest pain, No  pressure, No palpitations  Respiratory:  No  shortness of breath. No  Cough  Psychiatric: +concerns with depression, +concerns with anxiety  Exam:  BP (!) 138/57 (BP Location: Left Arm, Patient Position: Sitting, Cuff Size: Large)   Pulse 77   Temp 98 F (36.7 C) (Oral)   Wt 192 lb (87.1 kg)   SpO2 98%   BMI 30.99 kg/m   Constitutional: VS see above. General Appearance: alert, well-developed, well-nourished, NAD  Eyes: Normal lids and conjunctive, non-icteric sclera  Ears, Nose, Mouth, Throat: MMM, Normal external inspection ears/nares/mouth/lips/gums.  Neck: No masses, trachea midline.   Respiratory: Normal respiratory effort. no wheeze, no rhonchi, no rales  Cardiovascular: S1/S2 normal, no murmur, no rub/gallop auscultated. RRR.   Musculoskeletal: Gait normal. Symmetric and independent movement of all extremities  Neurological: Normal balance/coordination. No tremor.  Skin: warm, dry,  intact.   Psychiatric: Normal judgment/insight. Normal mood and affect. Oriented x3.     GAD 7 : Generalized Anxiety Score 03/15/2016  Nervous, Anxious, on Edge 2  Control/stop worrying 3  Worry too much - different things 3  Trouble relaxing 2  Restless 1   Easily annoyed or irritable 2  Afraid - awful might happen 1  Total GAD 7 Score 14  Anxiety Difficulty Somewhat difficult    Depression screen [facility] 2/9 06/21/2016 03/15/2016  Decreased Interest 2 1  Down, Depressed, Hopeless 2 2  PHQ - 2 Score 4 3  Altered sleeping 2 2  Tired, decreased energy 2 1  Change in appetite 2 1  Feeling bad or failure about yourself  2 2  Trouble concentrating 2 1  Moving slowly or fidgety/restless 1 0  Suicidal thoughts 1 1  PHQ-9 Score 16 11  Difficult doing work/chores - Somewhat difficult      ASSESSMENT/PLAN:   Patient attributes worsening of PHQ9 self reported symptoms to recent marital troubles. Occasional passive death wish but no thoughts of hurting self or others. Has another appointment with counseling later today. Would like to continue current medications.  Depression, unspecified depression type - Plan: escitalopram (LEXAPRO) 20 MG tablet  Generalized anxiety disorder - Plan: escitalopram (LEXAPRO) 20 MG tablet  Attention deficit disorder, unspecified hyperactivity presence - Plan: amphetamine-dextroamphetamine (ADDERALL) 10 MG tablet    Follow-up plan: Return in about 6 months (around 12/21/2016) for ANNUAL PHYSICAL and refill ADHD medications, sooner if needed .  Visit summary with medication list and pertinent instructions was printed for patient to review, alert [place] if any changes needed. All questions at time of visit were answered - patient instructed to contact office with any additional concerns. ER/RTC precautions were reviewed with the patient and understanding verbalized.

## 2016-06-21 NOTE — Progress Notes (Signed)
Pt here to follow up on medication

## 2016-06-27 ENCOUNTER — Encounter: Payer: Self-pay | Admitting: Osteopathic Medicine

## 2016-06-27 ENCOUNTER — Ambulatory Visit (INDEPENDENT_AMBULATORY_CARE_PROVIDER_SITE_OTHER): Payer: 59 | Admitting: Osteopathic Medicine

## 2016-06-27 VITALS — BP 126/62 | HR 68 | Ht 66.0 in | Wt 193.0 lb

## 2016-06-27 DIAGNOSIS — R109 Unspecified abdominal pain: Secondary | ICD-10-CM | POA: Diagnosis not present

## 2016-06-27 DIAGNOSIS — Z8742 Personal history of other diseases of the female genital tract: Secondary | ICD-10-CM | POA: Diagnosis not present

## 2016-06-27 DIAGNOSIS — R311 Benign essential microscopic hematuria: Secondary | ICD-10-CM

## 2016-06-27 LAB — POCT URINALYSIS DIPSTICK
BILIRUBIN UA: NEGATIVE
GLUCOSE UA: NEGATIVE
Ketones, UA: NEGATIVE
Leukocytes, UA: NEGATIVE
NITRITE UA: NEGATIVE
Protein, UA: NEGATIVE
SPEC GRAV UA: 1.015 (ref 1.010–1.025)
Urobilinogen, UA: 0.2 E.U./dL
pH, UA: 7 (ref 5.0–8.0)

## 2016-06-27 NOTE — Progress Notes (Signed)
HPI: Sarah Nicholson is a 46 y.o. female  who presents to [place] today, 06/27/16,  for chief complaint of:  Chief Complaint  Patient presents with  . Flank Pain     . Context:History of sepsis due to pyelonephritis, states at that point she also didn't have dysuria/urinary frequency, just flank pain. She is nervous about missing something like this again. . Location: Left lower back/flank off to the side. Has no complaints of abdominal pain but notable left lower quadrant tenderness on exam . Quality: Soreness, pressure . Duration: 3-4 days, not getting any worse over that time . Modifying factors: Worse with sitting too long, twisting movements . Assoc signs/symptoms: No fever/chills, no nausea or vomiting, no dysuria, no gross hematuria, chronic microscopic hematuria   Past medical history, surgical history, social history and family history reviewed.  Patient Active Problem List   Diagnosis Date Noted  . Generalized anxiety disorder 03/16/2016  . Social anxiety disorder 03/16/2016  . Major depressive disorder, recurrent episode, moderate ([place]) 03/16/2016  . Hallux valgus 12/09/2014  . Other and unspecified ovarian cyst 06/25/2013  . Left tennis elbow 06/25/2013  . Left breast mass 06/27/2012  . Microscopic hematuria 06/27/2012  . Hyperlipidemia 06/14/2012  . Prediabetes 06/14/2012  . History of leukocytosis 06/14/2012  . Seasonal allergies 06/14/2012  . Depression 06/14/2012  . ADD (attention deficit disorder) 06/14/2012    Current medication list and allergy/intolerance information reviewed.   Current Outpatient Prescriptions on File Prior to Visit  Medication Sig Dispense Refill  . amphetamine-dextroamphetamine (ADDERALL) 10 MG tablet Take 1 tablet (10 mg total) by mouth 2 (two) times daily. 60 tablet 0  . escitalopram (LEXAPRO) 20 MG tablet Take 1 tablet (20 mg total) by mouth daily. 90 tablet 3   No current  facility-administered medications on file prior to visit.    Allergies  Allergen Reactions  . Macrobid [Nitrofurantoin Macrocrystal] Hives  . Tetanus Toxoids Swelling    Pt states she was told the Tdap injection would have to be administered in the hospital  After discussion on 11/10 pt remembers being told it was thimersol in the tetanus 30+years ago that caused the swelling.       Review of Systems:  Constitutional: No recent illness  Cardiac: No  chest pain, No  pressure,   Respiratory:  No  shortness of breath. No  Cough  Gastrointestinal: +abdominal pain, no change on bowel habits  Musculoskeletal: +new myalgia/arthralgia  Skin: No  Rash  Neurologic: No  weakness, No  Dizziness   Exam:  BP 126/62   Pulse 68   Ht 5\' 6"  (1.676 m)   Wt 193 lb (87.5 kg)   BMI 31.15 kg/m   Constitutional: VS see above. General Appearance: alert, well-developed, well-nourished, NAD  Eyes: Normal lids and conjunctive, non-icteric sclera  Ears, Nose, Mouth, Throat: MMM, Normal external inspection ears/nares/mouth/lips/gums.  Neck: No masses, trachea midline.   Respiratory: Normal respiratory effort.   Musculoskeletal: Gait normal. Symmetric and independent movement of all extremities. Lloyd's sign negative bilaterally. (+) Tenderness left lateral quadratus lumborum area  Abdominal: (+)tenderness Left lower quadrant abdominal exam  Neurological: Normal balance/coordination. No tremor.   CT Abdomen Pelvis Wo Contrast (Stone Protocol)03/11/2013 [facility] Result Impression  IMPRESSION: 1.Mild bilateral pelviectasis/ureterectasis, may related to mild bladder distention. No distal trucking stone at the etiology by CT. 2.Mildly complex left ovarian cyst, likely physiologic. Subcentimeter increased attenuation focus within the left kidney, likely small hemorrhagic or proteinaceous cyst. 3.Gallbladder sludge,  no wall thickening or signs of acute cystitis. 4.Low-attenuation  focus within the right uterine fundus, likely a fibroid.   Reviewed previous urinalyses and urine culture results: Skin treated twice for UTI when culture resulted as negative, no leukocytosis   ASSESSMENT/PLAN:   Patient has had endometrial ablation but likely still has some level of menstrual cycling. History of ovarian cysts as noted in CT above. I'm suspicious more of an ovarian cyst causing pelvic and flank pain than urinary issue but would also consider possible stone or MSK etiology, less likely GI issue, pt declines imaging for now, will work on pain control and will get imaging if no better/worse  Flank pain - Plan: POCT Urinalysis Dipstick, Urine Culture, Urine Microscopic  Benign essential microscopic hematuria - Plan: Urine Culture, Urine Microscopic  History of ovarian cyst    Patient Instructions  Aches/Pains, Fever, Headache Acetaminophen (Tylenol) 500 mg tablets - take max 2 tablets (1000 mg) every 6 hours (4 times per day)  Ibuprofen (Motrin) 200 mg tablets - take max 4 tablets (800 mg) every 6 hours   Advised 200-600 mg Ibuprofen qid prn    Follow-up plan: Return if symptoms worsen or fail to improve.  Visit summary with medication list and pertinent instructions was printed for patient to review, alert [place] if any changes needed. All questions at time of visit were answered - patient instructed to contact office with any additional concerns. ER/RTC precautions were reviewed with the patient and understanding verbalized.

## 2016-06-27 NOTE — Patient Instructions (Signed)
Aches/Pains, Fever, Headache Acetaminophen (Tylenol) 500 mg tablets - take max 2 tablets (1000 mg) every 6 hours (4 times per day)  Ibuprofen (Motrin) 200 mg tablets - take max 4 tablets (800 mg) every 6 hours

## 2016-06-28 LAB — URINALYSIS, MICROSCOPIC ONLY
BACTERIA UA: NONE SEEN [HPF]
CASTS: NONE SEEN [LPF]
Crystals: NONE SEEN [HPF]
WBC UA: NONE SEEN WBC/HPF (ref <=5)
Yeast: NONE SEEN [HPF]

## 2016-06-29 LAB — URINE CULTURE

## 2016-07-12 ENCOUNTER — Ambulatory Visit (INDEPENDENT_AMBULATORY_CARE_PROVIDER_SITE_OTHER): Payer: PRIVATE HEALTH INSURANCE | Admitting: Licensed Clinical Social Worker

## 2016-07-12 DIAGNOSIS — F4010 Social phobia, unspecified: Secondary | ICD-10-CM

## 2016-07-12 DIAGNOSIS — F411 Generalized anxiety disorder: Secondary | ICD-10-CM

## 2016-07-12 DIAGNOSIS — F331 Major depressive disorder, recurrent, moderate: Secondary | ICD-10-CM

## 2016-07-12 DIAGNOSIS — F900 Attention-deficit hyperactivity disorder, predominantly inattentive type: Secondary | ICD-10-CM | POA: Diagnosis not present

## 2016-07-12 NOTE — Progress Notes (Signed)
   THERAPIST PROGRESS NOTE  Session Time: 1:05pm-1:55pm  Participation Level: Active  Behavioral Response: Casual Alert Mostly euthymic  Type of Therapy: Individual Therapy  Treatment Goals addressed: Increase participation and enjoyment of social activities, decrease rumination  Interventions: Assessment, Exploration of beliefs  Suicidal/Homicidal: Denied both  Therapist Interventions:  Reviewed progress with treatment goals.  Pointed out examples of socialization that she had not given herself credit for.  Explored pros and cons of developing and maintaining friendships. Discussed how practicing mindfulness has helped patient to stay in the present.       Summary: At first she denied making progress with increasing her socialization, but then she described a recent time when she interacted socially with a friend.  Admitted to feeling awkward for the first several minutes of interaction, but that her anxiety gradually diminished. Noted that she and her husband have been spending more time together.  Said that their relationship have been improving.  They are both reading a book called The 5 Love Languages so that they can learn more about each other and how they might communicate and connect more effectively. Indicated that she has been ruminating less.  Talked about how she has been practicing mindfulness in different ways.  Hasn't been getting so caught up in thoughts so her sleep has improved.          Plan: Indicated she would like to learn more about how she can present herself in a way that others find appealing while still maintaining authenticity.    Diagnosis:                           Generalized Anxiety Disorder                          Social Anxiety Disorder                          Major Depressive Disorder                          ADHD   Armandina Stammer 07/12/2016

## 2016-07-26 ENCOUNTER — Ambulatory Visit (HOSPITAL_COMMUNITY): Payer: Self-pay | Admitting: Licensed Clinical Social Worker

## 2016-08-01 ENCOUNTER — Other Ambulatory Visit: Payer: Self-pay | Admitting: Osteopathic Medicine

## 2016-08-22 ENCOUNTER — Ambulatory Visit (INDEPENDENT_AMBULATORY_CARE_PROVIDER_SITE_OTHER): Payer: PRIVATE HEALTH INSURANCE | Admitting: Licensed Clinical Social Worker

## 2016-08-22 DIAGNOSIS — F3341 Major depressive disorder, recurrent, in partial remission: Secondary | ICD-10-CM | POA: Diagnosis not present

## 2016-08-22 DIAGNOSIS — F909 Attention-deficit hyperactivity disorder, unspecified type: Secondary | ICD-10-CM | POA: Diagnosis not present

## 2016-08-22 DIAGNOSIS — F4010 Social phobia, unspecified: Secondary | ICD-10-CM | POA: Diagnosis not present

## 2016-08-22 DIAGNOSIS — F411 Generalized anxiety disorder: Secondary | ICD-10-CM

## 2016-08-22 NOTE — Progress Notes (Signed)
   THERAPIST PROGRESS NOTE  Session Time: 4:05pm-4:50pm  Participation Level: Active  Behavioral Response: Casual Alert Euthymic  Type of Therapy: Individual Therapy  Treatment Goals addressed: Increase participation and enjoyment of social activities, decrease rumination  Interventions: Review of treatment progress  Suicidal/Homicidal: Denied both  Therapist Interventions:  Reviewed progress with treatment goals.  Determined that she has achieved both of her goals.   Had her complete a PHQ-9 and GAD-7 to assess for progress with reducing symptoms of depression and anxiety.   Asked patient if there were any other things she felt needed to be addressed in therapy.  Collaboratively decided to terminate therapy at this time.   Summary: Identified several examples of engaging in socialization and getting some enjoyment during those experiences.  She said she has been feeling "more relaxed."  Talked about how she has continued to practice mindfulness on a regular basis and use it at times when she catches herself in rumination.   Noted that she feels good about her relationship with her husband.  Reported he has been more appreciative and helpful. Reported feeling "at peace" with herself.  Did not identify any further concerns to be addressed in therapy.   Depression screen [facility] 2/9 08/22/2016 06/21/2016 03/15/2016  Decreased Interest 0 2 1  Down, Depressed, Hopeless 0 2 2  PHQ - 2 Score 0 4 3  Altered sleeping - 2 2  Tired, decreased energy - 2 1  Change in appetite - 2 1  Feeling bad or failure about yourself  - 2 2  Trouble concentrating - 2 1  Moving slowly or fidgety/restless - 1 0  Suicidal thoughts - 1 1  PHQ-9 Score - 16 11  Difficult doing work/chores - - Somewhat difficult      GAD 7 : Generalized Anxiety Score 08/22/2016 03/15/2016  Nervous, Anxious, on Edge 1 2  Control/stop worrying 0 3  Worry too much - different things 0 3  Trouble relaxing 0 2  Restless 0 1  Easily  annoyed or irritable 1 2  Afraid - awful might happen 0 1  Total GAD 7 Score 2 14  Anxiety Difficulty Not difficult at all Somewhat difficult     Plan: No further sessions scheduled  Diagnosis:  History of GAD                          History of Social Anxiety Disorder                          Major Depressive Disorder, in partial remission                          ADHD   Garnette Scheuermann, LCSW 08/22/2016

## 2016-11-11 ENCOUNTER — Encounter: Payer: Self-pay | Admitting: Osteopathic Medicine

## 2016-11-11 ENCOUNTER — Ambulatory Visit (INDEPENDENT_AMBULATORY_CARE_PROVIDER_SITE_OTHER): Payer: 59 | Admitting: Osteopathic Medicine

## 2016-11-11 VITALS — BP 113/75 | HR 85 | Temp 98.4°F | Ht 66.0 in | Wt 197.0 lb

## 2016-11-11 DIAGNOSIS — R109 Unspecified abdominal pain: Secondary | ICD-10-CM | POA: Diagnosis not present

## 2016-11-11 DIAGNOSIS — M545 Low back pain, unspecified: Secondary | ICD-10-CM

## 2016-11-11 DIAGNOSIS — R42 Dizziness and giddiness: Secondary | ICD-10-CM | POA: Diagnosis not present

## 2016-11-11 DIAGNOSIS — R311 Benign essential microscopic hematuria: Secondary | ICD-10-CM | POA: Diagnosis not present

## 2016-11-11 LAB — POCT URINALYSIS DIPSTICK
BILIRUBIN UA: NEGATIVE
Glucose, UA: NEGATIVE
KETONES UA: NEGATIVE
Nitrite, UA: NEGATIVE
PH UA: 7 (ref 5.0–8.0)
SPEC GRAV UA: 1.025 (ref 1.010–1.025)
Urobilinogen, UA: 0.2 E.U./dL

## 2016-11-11 LAB — CBC WITH DIFFERENTIAL/PLATELET
BASOS ABS: 0 {cells}/uL (ref 0–200)
Basophils Relative: 0 %
EOS ABS: 116 {cells}/uL (ref 15–500)
EOS PCT: 1 %
HCT: 41.9 % (ref 35.0–45.0)
Hemoglobin: 14 g/dL (ref 11.7–15.5)
LYMPHS PCT: 32 %
Lymphs Abs: 3712 cells/uL (ref 850–3900)
MCH: 30.6 pg (ref 27.0–33.0)
MCHC: 33.4 g/dL (ref 32.0–36.0)
MCV: 91.5 fL (ref 80.0–100.0)
MONOS PCT: 9 %
MPV: 9.7 fL (ref 7.5–12.5)
Monocytes Absolute: 1044 cells/uL — ABNORMAL HIGH (ref 200–950)
NEUTROS ABS: 6728 {cells}/uL (ref 1500–7800)
NEUTROS PCT: 58 %
PLATELETS: 311 10*3/uL (ref 140–400)
RBC: 4.58 MIL/uL (ref 3.80–5.10)
RDW: 13.2 % (ref 11.0–15.0)
WBC: 11.6 10*3/uL — ABNORMAL HIGH (ref 3.8–10.8)

## 2016-11-11 MED ORDER — PANTOPRAZOLE SODIUM 40 MG PO TBEC: 40.0000 mg | DELAYED_RELEASE_TABLET | Freq: Every day | ORAL | 1 refills | Status: DC

## 2016-11-11 MED ORDER — ONDANSETRON 8 MG PO TBDP: 8.0000 mg | ORAL_TABLET | Freq: Three times a day (TID) | ORAL | 1 refills | Status: DC | PRN

## 2016-11-11 NOTE — Progress Notes (Signed)
HPI: Sarah Nicholson is a 46 y.o. female  who presents to [facility] today, 11/11/16,  for chief complaint of:  Chief Complaint  Patient presents with  . Abdominal Pain  . Back Pain    Back pain  . Location: lower . Quality: dull throbbing  . Severity: mild . Duration: few days . Assoc signs/symptoms: see below  Abdominal pain . Location: epigastric . Quality: burning and lump type pain with eating but Not what she would describe as early satiety.  . Duration: 5 days  . Timing: worse with meals . Modifying factors: Tums helps a bit . Assoc signs/symptoms: Occasional nausea, no vomiting. Some loose stool, no blood in the stool. Some lightheadedness with prolonged standing in the heat.  Last seen by me 06/27/16 for similar back pain/flank pain, hx sepsis d/t pyelonephritis so she is often nervous about recurrence. Hx ovarian cysts. Pt declined imaging at that visit.      Past medical, surgical, social and family history reviewed: Patient Active Problem List   Diagnosis Date Noted  . Generalized anxiety disorder 03/16/2016  . Social anxiety disorder 03/16/2016  . Major depressive disorder, recurrent episode, moderate ([place]) 03/16/2016  . Hallux valgus 12/09/2014  . Other and unspecified ovarian cyst 06/25/2013  . Left tennis elbow 06/25/2013  . Left breast mass 06/27/2012  . Microscopic hematuria 06/27/2012  . Hyperlipidemia 06/14/2012  . Prediabetes 06/14/2012  . History of leukocytosis 06/14/2012  . Seasonal allergies 06/14/2012  . Depression 06/14/2012  . ADD (attention deficit disorder) 06/14/2012   Past Surgical History:  Procedure Laterality Date  . APPENDECTOMY    . bladder sling/mesh    . INGUINAL HERNIA REPAIR    . intrauterine ablation    . NASAL SINUS SURGERY    . TUBAL LIGATION     Social History  Substance Use Topics  . Smoking status: Current Every Day Smoker  . Smokeless tobacco: Never Used  . Alcohol use  Yes     Comment: 1-2 drinks every 6 months   Family History  Problem Relation Age of Onset  . Depression Mother   . Diabetes Father   . Hyperlipidemia Father   . Hypertension Father   . Depression Sister   . Depression Son   . Breast cancer Maternal Grandmother      Current medication list and allergy/intolerance information reviewed:   Current Outpatient Prescriptions  Medication Sig Dispense Refill  . amphetamine-dextroamphetamine (ADDERALL) 10 MG tablet Take 1 tablet (10 mg total) by mouth 2 (two) times daily. 60 tablet 0  . EPINEPHRINE 0.3 mg/0.3 mL IJ SOAJ injection INJECT INTRAMUSCULARLY AS DIRECTED 2 mL 0  . escitalopram (LEXAPRO) 20 MG tablet Take 1 tablet (20 mg total) by mouth daily. 90 tablet 3   No current facility-administered medications for this visit.    Allergies  Allergen Reactions  . Macrobid [Nitrofurantoin Macrocrystal] Hives  . Tetanus Toxoids Swelling    Pt states she was told the Tdap injection would have to be administered in the hospital  After discussion on 11/10 pt remembers being told it was thimersol in the tetanus 30+years ago that caused the swelling.       Review of Systems:  Constitutional:  No  fever, no chills, No recent illness, No unintentional weight changes. +fatigue.   HEENT: No  headache, no vision change  Cardiac: No  chest pain, No  pressure, No palpitations  Respiratory:  No  shortness of breath. No  Cough  Gastrointestinal: +  abdominal pain, +nausea, No  vomiting,  No  blood in stool, +diarrhea, No  constipation   Musculoskeletal: No new myalgia/arthralgia  Genitourinary: No  incontinence, No  abnormal genital bleeding, No abnormal genital discharge  Skin: No  Rash  Endocrine: No cold intolerance,  No heat intolerance.  Neurologic: No  weakness, No  Dizziness, +occasional lightheadedness with standing too long  Psychiatric: No  concerns with depression, No  concerns with anxiety, No sleep problems, No mood  problems  Exam:  BP 113/75   Pulse 85   Temp 98.4 F (36.9 C) (Oral)   Ht _0  (1.676 m)   Wt 197 lb (89.4 kg)   BMI 31.80 kg/m   Constitutional: VS see above. General Appearance: alert, well-developed, well-nourished, NAD  Eyes: Normal lids and conjunctive, non-icteric sclera  Ears, Nose, Mouth, Throat: MMM, Normal external inspection ears/nares/mouth/lips/gums.   Neck: No masses, trachea midline. No thyroid enlargement. No tenderness/mass appreciated. No lymphadenopathy  Respiratory: Normal respiratory effort. no wheeze, no rhonchi, no rales  Cardiovascular: S1/S2 normal, no murmur, no rub/gallop auscultated. RRR. No lower extremity edema.   Gastrointestinal: Nontender, no masses. No hepatomegaly, no splenomegaly. No hernia appreciated. Bowel sounds normal. Rectal exam deferred.   Musculoskeletal: Gait normal. Neg Lloyd's sign bilaterally. (+)tendernss paraspinal/quadratus lumborum on R, Neg SLR BL  Neurological: Normal balance/coordination. No tremor. No cranial nerve deficit on limited exam. Motor intact and symmetric.  Skin: warm, dry, intact.     Results for orders placed or performed in visit on 11/11/16 (from the past 72 hour(s))  POCT Urinalysis Dipstick     Status: Abnormal   Collection Time: 11/11/16  9:18 AM  Result Value Ref Range   Color, UA YELLOW    Clarity, UA CLEAR    Glucose, UA NEGATIVE    Bilirubin, UA NEGATIVE    Ketones, UA NEGATIVE    Spec Grav, UA 1.025 1.010 - 1.025   Blood, UA LARGE    pH, UA 7.0 5.0 - 8.0   Protein, UA TRACE    Urobilinogen, UA 0.2 0.2 or 1.0 E.U./dL   Nitrite, UA NEGATIVE    Leukocytes, UA Trace (A) Negative      ASSESSMENT/PLAN:   Abdominal pain, unspecified abdominal location - Sounds like gastroenteritis versus gastritis. Possible viral. If no better after 7-10 days/with medications consider GI referral to discuss EGD or other workup - Plan: POCT Urinalysis Dipstick, Urine Culture, pantoprazole (PROTONIX) 40 MG  tablet, CBC with Differential/Platelet, COMPLETE METABOLIC PANEL WITH GFR, Lipid panel, TSH, Lipase, Amylase, CBC with Differential/Platelet, CMP14+EGFR, Lipid panel, TSH, Lipase, Amylase, ondansetron (ZOFRAN-ODT) 8 MG disintegrating tablet  Acute right-sided low back pain without sciatica - Mild, associated with some diarrhea, likely viscerosomatic reflex versus primary must go skeletal low back pain. Monitor, Tylenol okay  Benign essential microscopic hematuria - Previous workup with urology, we'll confirm with additional urine test but not suspicious of UTI at this point  Episodic lightheadedness - Decreased appetite and decreased hydration likely causing some orthostasis/vasovagal. If no improvement with treatment, consider further workup       Visit summary with medication list and pertinent instructions was printed for patient to review. All questions at time of visit were answered - patient instructed to contact office with any additional concerns. ER/RTC precautions were reviewed with the patient. Follow-up plan: Return for recheck symptoms next week or week after, sooner if needed - can cancel appointment if youre ok.  Note: Total time spent 25 minutes, greater than 50% of the visit  was spent face-to-face counseling and coordinating care for the following: The primary encounter diagnosis was Abdominal pain, unspecified abdominal location. Diagnoses of Acute right-sided low back pain without sciatica, Benign essential microscopic hematuria, and Episodic lightheadedness were also pertinent to this visit.[name]

## 2016-11-12 LAB — COMPLETE METABOLIC PANEL WITH GFR
ALK PHOS: 60 U/L (ref 33–115)
ALT: 8 U/L (ref 6–29)
AST: 11 U/L (ref 10–35)
Albumin: 3.7 g/dL (ref 3.6–5.1)
BUN: 15 mg/dL (ref 7–25)
CO2: 21 mmol/L (ref 20–32)
Calcium: 9.1 mg/dL (ref 8.6–10.2)
Chloride: 109 mmol/L (ref 98–110)
Creat: 0.94 mg/dL (ref 0.50–1.10)
GFR, EST AFRICAN AMERICAN: 84 mL/min (ref >=60)
GFR, EST NON AFRICAN AMERICAN: 72 mL/min (ref >=60)
GLUCOSE: 81 mg/dL (ref 65–99)
POTASSIUM: 4.4 mmol/L (ref 3.5–5.3)
SODIUM: 141 mmol/L (ref 135–146)
TOTAL PROTEIN: 6 g/dL — AB (ref 6.1–8.1)
Total Bilirubin: 0.2 mg/dL (ref 0.2–1.2)

## 2016-11-12 LAB — LIPID PANEL
Cholesterol: 203 mg/dL — ABNORMAL HIGH (ref <200)
HDL: 29 mg/dL — ABNORMAL LOW (ref >50)
LDL Cholesterol: 111 mg/dL — ABNORMAL HIGH (ref <100)
Total CHOL/HDL Ratio: 7 Ratio — ABNORMAL HIGH (ref <5.0)
Triglycerides: 317 mg/dL — ABNORMAL HIGH (ref <150)
VLDL: 63 mg/dL — ABNORMAL HIGH (ref <30)

## 2016-11-12 LAB — LIPASE: Lipase: 19 U/L (ref 7–60)

## 2016-11-12 LAB — AMYLASE: AMYLASE: 30 U/L (ref 21–101)

## 2016-11-12 LAB — TSH: TSH: 2.53 m[IU]/L

## 2016-11-13 LAB — URINE CULTURE

## 2016-11-18 ENCOUNTER — Telehealth: Payer: Self-pay

## 2016-11-18 NOTE — Telephone Encounter (Signed)
Notified patient of recommendation.  She will set up an appointment.

## 2016-11-18 NOTE — Telephone Encounter (Signed)
Would recommend follow-up appointment to reevaluate symptoms she is still feeling sick and discuss cholesterol results in detail. Based on her numbers, a cholesterol medication is an option but is not mandatory, the best thing she can do for heart health is quit smoking.

## 2016-11-18 NOTE — Telephone Encounter (Signed)
Pt called back and went over lab results.  She is still nauseated.  Would you like her to make a follow up appointment?  She is also asking about if she needs to start a medication for her cholesterol?  Please advise.

## 2016-11-24 ENCOUNTER — Ambulatory Visit (INDEPENDENT_AMBULATORY_CARE_PROVIDER_SITE_OTHER): Payer: 59

## 2016-11-24 ENCOUNTER — Ambulatory Visit: Payer: 59

## 2016-11-24 ENCOUNTER — Encounter: Payer: Self-pay | Admitting: Osteopathic Medicine

## 2016-11-24 ENCOUNTER — Ambulatory Visit (INDEPENDENT_AMBULATORY_CARE_PROVIDER_SITE_OTHER): Payer: 59 | Admitting: Osteopathic Medicine

## 2016-11-24 VITALS — BP 126/85 | HR 67 | Ht 66.0 in | Wt 198.0 lb

## 2016-11-24 DIAGNOSIS — R1031 Right lower quadrant pain: Secondary | ICD-10-CM | POA: Diagnosis not present

## 2016-11-24 DIAGNOSIS — R112 Nausea with vomiting, unspecified: Secondary | ICD-10-CM

## 2016-11-24 DIAGNOSIS — K760 Fatty (change of) liver, not elsewhere classified: Secondary | ICD-10-CM | POA: Diagnosis not present

## 2016-11-24 DIAGNOSIS — N83202 Unspecified ovarian cyst, left side: Secondary | ICD-10-CM

## 2016-11-24 DIAGNOSIS — R197 Diarrhea, unspecified: Secondary | ICD-10-CM | POA: Diagnosis not present

## 2016-11-24 DIAGNOSIS — D251 Intramural leiomyoma of uterus: Secondary | ICD-10-CM | POA: Diagnosis not present

## 2016-11-24 MED ORDER — IOPAMIDOL (ISOVUE-300) INJECTION 61%
100.0000 mL | Freq: Once | INTRAVENOUS | Status: AC | PRN
  Administered 2016-11-24: 100 mL via INTRAVENOUS

## 2016-11-24 NOTE — Patient Instructions (Addendum)
Plan:  CT Abdomen today   Stool studies and referral to GI if nothing shows up on CT   Continue nausea medications   See below for diet instructions: Clear Liquids and advance slowly to Full Liquids then [facility] then Regular

## 2016-11-24 NOTE — Progress Notes (Signed)
HPI: Sarah Nicholson is a 46 y.o. female  who presents to [facility] today, 11/24/16,  for chief complaint of: No chief complaint on file.   Abd pain . 11/11/16 seen initially for this problem of abdominal pain - at that point was x5 days, worse with meals, burning type stomach/epigastric pain and feeling of lump in the throat, some loose stool.  . Dizziness has resolved.  . Labs were ok, treatment with protonix and zofran for presumed gastritis seems to have helped somewhat but generalized abdominal discomfort, occasional loose stool and persistent nausea    Past medical, surgical, social and family history reviewed: Patient Active Problem List   Diagnosis Date Noted  . Generalized anxiety disorder 03/16/2016  . Social anxiety disorder 03/16/2016  . Major depressive disorder, recurrent episode, moderate ([place]) 03/16/2016  . Hallux valgus 12/09/2014  . Other and unspecified ovarian cyst 06/25/2013  . Left tennis elbow 06/25/2013  . Left breast mass 06/27/2012  . Microscopic hematuria 06/27/2012  . Hyperlipidemia 06/14/2012  . Prediabetes 06/14/2012  . History of leukocytosis 06/14/2012  . Seasonal allergies 06/14/2012  . Depression 06/14/2012  . ADD (attention deficit disorder) 06/14/2012   Past Surgical History:  Procedure Laterality Date  . APPENDECTOMY    . bladder sling/mesh    . INGUINAL HERNIA REPAIR    . intrauterine ablation    . NASAL SINUS SURGERY    . TUBAL LIGATION     Social History  Substance Use Topics  . Smoking status: Current Every Day Smoker  . Smokeless tobacco: Never Used  . Alcohol use Yes     Comment: 1-2 drinks every 6 months   Family History  Problem Relation Age of Onset  . Depression Mother   . Diabetes Father   . Hyperlipidemia Father   . Hypertension Father   . Depression Sister   . Depression Son   . Breast cancer Maternal Grandmother      Current medication list and allergy/intolerance  information reviewed:   Current Outpatient Prescriptions  Medication Sig Dispense Refill  . amphetamine-dextroamphetamine (ADDERALL) 10 MG tablet Take 1 tablet (10 mg total) by mouth 2 (two) times daily. 60 tablet 0  . EPINEPHRINE 0.3 mg/0.3 mL IJ SOAJ injection INJECT INTRAMUSCULARLY AS DIRECTED 2 mL 0  . escitalopram (LEXAPRO) 20 MG tablet Take 1 tablet (20 mg total) by mouth daily. 90 tablet 3  . ondansetron (ZOFRAN-ODT) 8 MG disintegrating tablet Take 1 tablet (8 mg total) by mouth every 8 (eight) hours as needed for nausea or vomiting. 20 tablet 1  . pantoprazole (PROTONIX) 40 MG tablet Take 1 tablet (40 mg total) by mouth daily. 30 tablet 1   No current facility-administered medications for this visit.    Allergies  Allergen Reactions  . Macrobid [Nitrofurantoin Macrocrystal] Hives  . Tetanus Toxoids Swelling    Pt states she was told the Tdap injection would have to be administered in the hospital  After discussion on 11/10 pt remembers being told it was thimersol in the tetanus 30+years ago that caused the swelling.       Review of Systems:  Constitutional:  No  fever, no chills, +recent GI illness as per HPI, No unintentional weight changes. No significant fatigue.   HEENT: No  headache, no vision change  Cardiac: No  chest pain, No  pressure  Respiratory:  No  shortness of breath. No  Cough  Gastrointestinal: +abdominal pain, +nausea, No  vomiting,  No  blood in stool, +  diarrhea, No  constipation   Musculoskeletal: No new myalgia/arthralgia  Genitourinary: No  incontinence, No  abnormal genital bleeding, No abnormal genital discharge  Skin: No  Rash, No other wounds/concerning lesions  Neurologic: No  weakness, No  Dizziness   Exam:  BP 126/85   Pulse 67   Ht 5\' 6"  (1.676 m)   Wt 198 lb (89.8 kg)   BMI 31.96 kg/m   Constitutional: VS see above. General Appearance: alert, well-developed, well-nourished, NAD  Eyes: Normal lids and conjunctive, non-icteric  sclera  Ears, Nose, Mouth, Throat: MMM, Normal external inspection ears/nares/mouth/lips/gums  Neck: No masses, trachea midline.   Respiratory: Normal respiratory effort. no wheeze, no rhonchi, no rales  Cardiovascular: S1/S2 normal, no murmur, no rub/gallop auscultated. RRR. No lower extremity edema.   Gastrointestinal: +TTP RLQ and RUQ, no masses. No hepatomegaly, no splenomegaly. No hernia appreciated. Bowel sounds hyperactive. Rectal exam deferred.   Musculoskeletal: Gait normal.   Neurological: Normal balance/coordination. No tremor.   Skin: warm, dry, intact. No rash/ulcer.    Psychiatric: Normal judgment/insight. Normal mood and affect. Oriented x3.    No results found for this or any previous visit (from the past 72 hour(s)).  Ct Abdomen Pelvis W Contrast  Result Date: 11/24/2016 CLINICAL DATA:  Nausea x2 weeks with vomiting yesterday. Abdominal pain today with palpation. EXAM: CT ABDOMEN AND PELVIS WITH CONTRAST TECHNIQUE: Multidetector CT imaging of the abdomen and pelvis was performed using the standard protocol following bolus administration of intravenous contrast. CONTRAST:  100 cc Isovue-300 COMPARISON:  None. FINDINGS: Lower chest: Normal size heart without pericardial effusion. Minimal dependent atelectasis. Hepatobiliary: Mild hepatic steatosis. No space-occupying mass of the liver. No biliary dilatation. The gallbladder is nondistended. There are no calculi. Pancreas: Normal Spleen: Normal Adrenals/Urinary Tract: Adrenal glands are unremarkable. Kidneys are normal, without renal calculi, focal lesion, or hydronephrosis. Bladder is unremarkable. Stomach/Bowel: Stomach is within normal limits. Appendix is surgically absent. No evidence of bowel wall thickening, distention, or inflammatory changes. Vascular/Lymphatic: Mild aortic atherosclerosis. No enlarged abdominal or pelvic lymph nodes. Reproductive: Enhancing 1.5 cm left ovarian hemorrhagic cyst or corpus luteum. 1 cm  hypodense intramural uterine lesion consistent with a fibroid. Other: No abdominal wall hernia or abnormality. No abdominopelvic ascites. Musculoskeletal: No acute or significant osseous findings. IMPRESSION: 1. Hemorrhagic left ovarian cyst or corpus luteum measuring 1.5 cm. 2. 1 cm intramural uterine fibroid. 3. No acute bowel inflammation or obstruction. 4. Hepatic steatosis. Electronically Signed   By: Ashley Royalty M.D.   On: 11/24/2016 16:30     ASSESSMENT/PLAN:   Non-intractable vomiting with nausea, unspecified vomiting type - Plan: CT Abdomen Pelvis W Contrast, Ambulatory referral to Gastroenterology  Right lower quadrant abdominal pain - Plan: Ambulatory referral to Gastroenterology  Diarrhea, unspecified type - Plan: Ambulatory referral to Gastroenterology    Patient Instructions  Plan:  CT Abdomen today   Stool studies and referral to GI if nothing shows up on CT   Continue nausea medications   See below for diet instructions: Clear Liquids and advance slowly to Full Liquids then [place] then Regular     Visit summary with medication list and pertinent instructions was printed for patient to review. All questions at time of visit were answered - patient instructed to contact office with any additional concerns. ER/RTC precautions were reviewed with the patient. Follow-up plan: Return for recheck if worse .  Note: Total time spent 25 minutes, greater than 50% of the visit was spent face-to-face counseling and coordinating care for  the following: The primary encounter diagnosis was Non-intractable vomiting with nausea, unspecified vomiting type. A diagnosis of Right lower quadrant abdominal pain was also pertinent to this visit..    Addendum 11/25/16  See result notes for imaging - patient called and questions answered.  Office closed today She is feeling ok Will refer to GI

## 2016-12-15 ENCOUNTER — Encounter: Payer: Self-pay | Admitting: Gastroenterology

## 2016-12-15 ENCOUNTER — Ambulatory Visit (INDEPENDENT_AMBULATORY_CARE_PROVIDER_SITE_OTHER): Payer: 59 | Admitting: Gastroenterology

## 2016-12-15 VITALS — BP 111/69 | HR 76 | Temp 98.5°F | Ht 66.0 in | Wt 197.8 lb

## 2016-12-15 DIAGNOSIS — R112 Nausea with vomiting, unspecified: Secondary | ICD-10-CM | POA: Diagnosis not present

## 2016-12-15 DIAGNOSIS — R1084 Generalized abdominal pain: Secondary | ICD-10-CM | POA: Diagnosis not present

## 2016-12-15 DIAGNOSIS — R140 Abdominal distension (gaseous): Secondary | ICD-10-CM | POA: Diagnosis not present

## 2016-12-15 DIAGNOSIS — R1012 Left upper quadrant pain: Secondary | ICD-10-CM

## 2016-12-15 NOTE — Progress Notes (Signed)
Jonathon Bellows MD, MRCP(U.K) [address]  [place], [place] [address]  Main: [phone]  Fax: [phone]   Gastroenterology Consultation  Referring Provider:     Emeterio Reeve, DO Primary Care Physician:  Emeterio Reeve, DO Primary Gastroenterologist:  Dr. Jonathon Bellows  Reason for Consultation:     Diarrhea ,abdominal pain         HPI:   Sarah Nicholson is a 46 y.o. y/o female referred for consultation & management  by Dr. Emeterio Reeve, DO.     She has been referred for abdominal pain , vomiting . CT abdomen 11/24/16- showed hepatic steatosis otherwise no other GI abnormality. Amylase,CMP,CBC,TSH-normal    She says she was referred for nausea. She says the nausea began at the end of august , every day , all day long ,at times woke up at night , lots of gas, could not stop burping . Went to see her doctor, she then had a CT scan , subsequently she has felt better over the past 1 week. At times when she eats gets a pain in the LUQ. Similar pain some years back, had an EGD and colonoscopy , was told it was normal and resolved back to normal gradually. She says she has not eaten fast food for some days , today had a hamburger, right after had the pain and some gas, the pain lasted for an hour . Squeezing in nature, feels like a rock in her stomach . The nausea is not related to the pains. She smokes 15 cigarettes a day . No marijuana exposure. She uses Ibuprofen 1 time a week or two weeks. She took some ibuprofen 3 days back for cramps.   Denies any diarrhea. She had some diarrhea in the past which has resolved. She does have her gall bladder in place. . Her sister and mother had their gall bladders removed. She does have times when she has abdominal distension and passes gas that is foul smelling. It does occur on days when she has more pain.   She consumes 48 oz of diet coke a day - no other artificial sugars in her diet .     Past Medical History:    Diagnosis Date  . ADD (attention deficit disorder) 06/14/2012  . Depression 06/14/2012  . History of leukocytosis 06/14/2012   Reports "WBC 32" decades ago with negative hematology specialist workup.   [name] Hyperlipidemia 06/14/2012  . Prediabetes 06/14/2012  . Seasonal allergies 06/14/2012    Past Surgical History:  Procedure Laterality Date  . APPENDECTOMY    . bladder sling/mesh    . INGUINAL HERNIA REPAIR    . intrauterine ablation    . NASAL SINUS SURGERY    . TUBAL LIGATION      Prior to Admission medications   Medication Sig Start Date End Date Taking? Authorizing Provider  amphetamine-dextroamphetamine (ADDERALL) 10 MG tablet Take 1 tablet (10 mg total) by mouth 2 (two) times daily. 06/21/16 07/21/17  Emeterio Reeve, DO  EPINEPHRINE 0.3 mg/0.3 mL IJ SOAJ injection INJECT INTRAMUSCULARLY AS DIRECTED 08/02/16   Emeterio Reeve, DO  escitalopram (LEXAPRO) 20 MG tablet Take 1 tablet (20 mg total) by mouth daily. 06/21/16   Emeterio Reeve, DO  ondansetron (ZOFRAN-ODT) 8 MG disintegrating tablet Take 1 tablet (8 mg total) by mouth every 8 (eight) hours as needed for nausea or vomiting. 11/11/16   Emeterio Reeve, DO  oxyCODONE-acetaminophen (PERCOCET/ROXICET) 5-325 MG tablet Take by mouth.    [provider]  pantoprazole (PROTONIX) 40 MG tablet Take 1 tablet (40 mg total) by mouth daily. 11/11/16   Emeterio Reeve, DO    Family History  Problem Relation Age of Onset  . Depression Mother   . Diabetes Father   . Hyperlipidemia Father   . Hypertension Father   . Depression Sister   . Depression Son   . Breast cancer Maternal Grandmother      Social History  Substance Use Topics  . Smoking status: Current Every Day Smoker  . Smokeless tobacco: Never Used  . Alcohol use Yes     Comment: 1-2 drinks every 6 months    Allergies as of 12/15/2016 - Review Complete 11/25/2016  Allergen Reaction Noted  . Nitrofurantoin Hives 03/11/2013  . Macrobid [nitrofurantoin  macrocrystal] Hives 06/14/2012  . Tetanus toxoids Swelling 06/14/2012    Review of Systems:    All systems reviewed and negative except where noted in HPI.   Physical Exam:  There were no vitals taken for this visit. No LMP recorded. Patient has had an ablation. Psych:  Alert and cooperative. Normal mood and affect. General:   Alert,  Well-developed, well-nourished, pleasant and cooperative in NAD Head:  Normocephalic and atraumatic. Eyes:  Sclera clear, no icterus.   Conjunctiva pink. Ears:  Normal auditory acuity. Nose:  No deformity, discharge, or lesions. Mouth:  No deformity or lesions,oropharynx pink & moist. Neck:  Supple; no masses or thyromegaly. Lungs:  Respirations even and unlabored.  Clear throughout to auscultation.   No wheezes, crackles, or rhonchi. No acute distress. Heart:  Regular rate and rhythm; no murmurs, clicks, rubs, or gallops. Abdomen:  Normal bowel sounds.  No bruits.  Soft, non-tender and non-distended without masses, hepatosplenomegaly or hernias noted.  No guarding or rebound tenderness.    Neurologic:  Alert and oriented x3;  grossly normal neurologically. Skin:  Intact without significant lesions or rashes. No jaundice. Lymph Nodes:  No significant cervical adenopathy. Psych:  Alert and cooperative. Normal mood and affect.  Imaging Studies: Ct Abdomen Pelvis W Contrast  Result Date: 11/24/2016 CLINICAL DATA:  Nausea x2 weeks with vomiting yesterday. Abdominal pain today with palpation. EXAM: CT ABDOMEN AND PELVIS WITH CONTRAST TECHNIQUE: Multidetector CT imaging of the abdomen and pelvis was performed using the standard protocol following bolus administration of intravenous contrast. CONTRAST:  100 cc Isovue-300 COMPARISON:  None. FINDINGS: Lower chest: Normal size heart without pericardial effusion. Minimal dependent atelectasis. Hepatobiliary: Mild hepatic steatosis. No space-occupying mass of the liver. No biliary dilatation. The gallbladder is  nondistended. There are no calculi. Pancreas: Normal Spleen: Normal Adrenals/Urinary Tract: Adrenal glands are unremarkable. Kidneys are normal, without renal calculi, focal lesion, or hydronephrosis. Bladder is unremarkable. Stomach/Bowel: Stomach is within normal limits. Appendix is surgically absent. No evidence of bowel wall thickening, distention, or inflammatory changes. Vascular/Lymphatic: Mild aortic atherosclerosis. No enlarged abdominal or pelvic lymph nodes. Reproductive: Enhancing 1.5 cm left ovarian hemorrhagic cyst or corpus luteum. 1 cm hypodense intramural uterine lesion consistent with a fibroid. Other: No abdominal wall hernia or abnormality. No abdominopelvic ascites. Musculoskeletal: No acute or significant osseous findings. IMPRESSION: 1. Hemorrhagic left ovarian cyst or corpus luteum measuring 1.5 cm. 2. 1 cm intramural uterine fibroid. 3. No acute bowel inflammation or obstruction. 4. Hepatic steatosis. Electronically Signed   By: Ashley Royalty M.D.   On: 11/24/2016 16:30    Assessment and Plan:   Sarah Nicholson is a 46 y.o. y/o female has been referred for abdominal pain and diarrhea. Diarrhea has resolved. The  abdominal pain with associated bloating can be from possibly a combination of aerophagia from smoking , gas from carbonated beverages/SIBO and possible effects of NSAID related injury.    Plan 1. Stool for H pylori antigen  2. LOW FODMAP diet  3. Trial of IB guard 4. HIDA scan  5. Stop smoking and stop diet sodas I fno better will discuss about an EGD.     Follow up in 6 weeks   Dr Jonathon Bellows MD,MRCP(U.K)

## 2016-12-22 ENCOUNTER — Other Ambulatory Visit
Admission: RE | Admit: 2016-12-22 | Discharge: 2016-12-22 | Disposition: A | Discharge status: Home / Self Care | Payer: 59 | Source: Ambulatory Visit | Attending: Gastroenterology | Admitting: Gastroenterology

## 2016-12-22 DIAGNOSIS — R1012 Left upper quadrant pain: Secondary | ICD-10-CM | POA: Insufficient documentation

## 2016-12-22 DIAGNOSIS — R140 Abdominal distension (gaseous): Secondary | ICD-10-CM | POA: Diagnosis not present

## 2016-12-23 ENCOUNTER — Ambulatory Visit: Payer: 59 | Admitting: Gastroenterology

## 2016-12-23 LAB — H. PYLORI ANTIGEN, STOOL: H. Pylori Stool Ag, Eia: NEGATIVE

## 2016-12-29 ENCOUNTER — Telehealth: Payer: Self-pay

## 2016-12-29 NOTE — Telephone Encounter (Signed)
LVM for patient callback for results per Dr. Anna.    - H pylori stool antigen negative  

## 2016-12-29 NOTE — Telephone Encounter (Signed)
-----   Message from Jonathon Bellows, MD sent at 12/27/2016  8:34 AM EDT ----- H pylori stool antigen negative

## 2017-01-26 ENCOUNTER — Encounter: Payer: Self-pay | Admitting: Gastroenterology

## 2017-01-26 ENCOUNTER — Ambulatory Visit (INDEPENDENT_AMBULATORY_CARE_PROVIDER_SITE_OTHER): Payer: Self-pay | Admitting: Gastroenterology

## 2017-01-26 VITALS — BP 135/76 | HR 72 | Temp 98.3°F | Ht 66.0 in | Wt 196.8 lb

## 2017-01-26 DIAGNOSIS — R1012 Left upper quadrant pain: Secondary | ICD-10-CM

## 2017-01-26 DIAGNOSIS — R140 Abdominal distension (gaseous): Secondary | ICD-10-CM

## 2017-01-26 NOTE — Progress Notes (Signed)
Jonathon Bellows MD, MRCP(U.K) [address]  [place]  [place], [place] [address]  Main: [phone]  Fax: [phone]   Primary Care Physician: Emeterio Reeve, DO  Primary Gastroenterologist:  Dr. Jonathon Bellows   No chief complaint on file.   HPI: Sarah Nicholson is a 46 y.o. female    Summary of history :  She is here to follow up to her initial visit when she was referred and seen in 12/2016 for abdominal pain , vomiting . CT abdomen 11/24/16- showed hepatic steatosis otherwise no other GI abnormality. Amylase,CMP,CBC,TSH-normal. The nausea began at the end of august , every day , all day long ,at times woke up at night , lots of gas, could not stop burping . Went to see her doctor, she then had a CT scan , subsequently she has felt better over the past 1 week. At times when she eats gets a pain in the LUQ. Similar pain some years back, had an EGD and colonoscopy , was told it was normal and resolved back to normal gradually. The nausea is not related to the pains. She smokes 15 cigarettes a day . No marijuana exposure. She uses Ibuprofen 1 time a week or two weeks. Her sister and mother had their gall bladders removed. She does have times when she has abdominal distension and passes gas that is foul smelling. It does occur on days when she has more pain. She consumes 48 oz of diet coke a day - no other artificial sugars in her diet .   Interval history   12/31/2016-  01/26/2017  H pylori stool antigen -negative. HIDA scan ordered but not yet obtained  Doing well, no abdominal pain , compliant to LOWFODMAP diet , IB guard. No symptoms. Stopped NSAID's  Stopped all Diet sodas.   Current Outpatient Medications  Medication Sig Dispense Refill  . amphetamine-dextroamphetamine (ADDERALL) 10 MG tablet Take 1 tablet (10 mg total) by mouth 2 (two) times daily. 60 tablet 0  . EPINEPHRINE 0.3 mg/0.3 mL IJ SOAJ injection INJECT INTRAMUSCULARLY AS DIRECTED 2 mL 0  . escitalopram  (LEXAPRO) 20 MG tablet Take 1 tablet (20 mg total) by mouth daily. 90 tablet 3  . ondansetron (ZOFRAN-ODT) 8 MG disintegrating tablet Take 1 tablet (8 mg total) by mouth every 8 (eight) hours as needed for nausea or vomiting. (Patient not taking: Reported on 12/15/2016) 20 tablet 1  . oxyCODONE-acetaminophen (PERCOCET/ROXICET) 5-325 MG tablet Take by mouth.    . pantoprazole (PROTONIX) 40 MG tablet Take 1 tablet (40 mg total) by mouth daily. (Patient not taking: Reported on 12/15/2016) 30 tablet 1   No current facility-administered medications for this visit.     Allergies as of 01/26/2017 - Review Complete 12/15/2016  Allergen Reaction Noted  . Nitrofurantoin Hives 03/11/2013  . Macrobid [nitrofurantoin macrocrystal] Hives 06/14/2012  . Tetanus toxoids Swelling 06/14/2012    ROS:  General: Negative for anorexia, weight loss, fever, chills, fatigue, weakness. ENT: Negative for hoarseness, difficulty swallowing , nasal congestion. CV: Negative for chest pain, angina, palpitations, dyspnea on exertion, peripheral edema.  Respiratory: Negative for dyspnea at rest, dyspnea on exertion, cough, sputum, wheezing.  GI: See history of present illness. GU:  Negative for dysuria, hematuria, urinary incontinence, urinary frequency, nocturnal urination.  Endo: Negative for unusual weight change.    Physical Examination:   There were no vitals taken for this visit.  General: Well-nourished, well-developed in no acute distress.  Eyes: No icterus. Conjunctivae pink. Mouth: Oropharyngeal mucosa moist  and pink , no lesions erythema or exudate. Lungs: Clear to auscultation bilaterally. Non-labored. Heart: Regular rate and rhythm, no murmurs rubs or gallops.  Abdomen: Bowel sounds are normal, nontender, nondistended, no hepatosplenomegaly or masses, no abdominal bruits or hernia , no rebound or guarding.   Extremities: No lower extremity edema. No clubbing or deformities. Neuro: Alert and oriented x 3.   Grossly intact. Skin: Warm and dry, no jaundice.   Psych: Alert and cooperative, normal mood and affect.   Imaging Studies: No results found.  Assessment and Plan:   Sarah Nicholson is a 46 y.o. y/o female here to follow up  for abdominal pain and diarrhea. Diarrhea has resolved. The abdominal pain with associated bloating can be from possibly a combination of aerophagia from smoking , gas from carbonated beverages/SIBO .Doing well since last visit    Plan 1. LOW FODMAP diet - more patient information provided 2. Continue IB guard- discount card provided  3 .Stop smoking  Dr Jonathon Bellows  MD,MRCP [facility]) Follow up in as needed.

## 2017-03-29 ENCOUNTER — Encounter: Payer: Self-pay | Admitting: Osteopathic Medicine

## 2017-03-29 ENCOUNTER — Ambulatory Visit (INDEPENDENT_AMBULATORY_CARE_PROVIDER_SITE_OTHER): Payer: Self-pay | Admitting: Osteopathic Medicine

## 2017-03-29 VITALS — BP 130/59 | HR 73 | Temp 98.1°F | Wt 196.1 lb

## 2017-03-29 DIAGNOSIS — F418 Other specified anxiety disorders: Secondary | ICD-10-CM

## 2017-03-29 MED ORDER — CLONAZEPAM 0.5 MG PO TABS: 0.5000 mg | ORAL_TABLET | Freq: Two times a day (BID) | ORAL | 1 refills | Status: DC | PRN

## 2017-03-29 NOTE — Progress Notes (Signed)
HPI: Sarah Nicholson is a 47 y.o. female who  has a past medical history of ADD (attention deficit disorder) (06/14/2012), Depression (06/14/2012), History of leukocytosis (06/14/2012), Hyperlipidemia (06/14/2012), Prediabetes (06/14/2012), and Seasonal allergies (06/14/2012).  she presents to [facility] today, 03/29/17,  for chief complaint of:  Chief Complaint  Patient presents with  . Anxiety     Severe stress and anxiety lately, Had been following with LCSW downstairs at behavioral health last seen 07/2016 - had been working on anxiety issues, social anxiety. Was doing a lot better but situational factors right now have printed her to seek treatment for medications as needed anxiety.   Having some difficulties with her husband, suspicious of him possibly being in contact with a woman who he almost called off their wedding for. We'll be traveling with him and her stepdaughter over the weekend to [place] for a cheerleading competition. Her plan is to see how the next 10-14 days ago, would like to get some medication to help get her through this, if she is still feeling like she is having trouble coping or not sure what to do, she will reschedule with behavioral health downstairs.   Is having occasional panic symptoms where she feels shortness of breath and tightness in the chest when she thinks too much about her situation. No exertional dyspnea or chest pressure otherwise       Past medical, surgical, social and family history reviewed:  Patient Active Problem List   Diagnosis Date Noted  . Generalized anxiety disorder 03/16/2016  . Social anxiety disorder 03/16/2016  . Major depressive disorder, recurrent episode, moderate ([place]) 03/16/2016  . Right inguinal hernia 07/14/2015  . Hallux valgus 12/09/2014  . Acute headache 07/31/2014  . Chest pain 07/31/2014  . Hypotension 07/31/2014  . Other and unspecified ovarian cyst 06/25/2013  . Left tennis elbow  06/25/2013  . Left breast mass 06/27/2012  . Microscopic hematuria 06/27/2012  . Hyperlipidemia 06/14/2012  . Prediabetes 06/14/2012  . History of leukocytosis 06/14/2012  . Seasonal allergies 06/14/2012  . Depression 06/14/2012  . ADD (attention deficit disorder) 06/14/2012    Past Surgical History:  Procedure Laterality Date  . APPENDECTOMY    . bladder sling/mesh    . INGUINAL HERNIA REPAIR    . intrauterine ablation    . NASAL SINUS SURGERY    . TUBAL LIGATION      Social History   Tobacco Use  . Smoking status: Current Every Day Smoker  . Smokeless tobacco: Never Used  Substance Use Topics  . Alcohol use: Yes    Comment: 1-2 drinks every 6 months    Family History  Problem Relation Age of Onset  . Depression Mother   . Diabetes Father   . Hyperlipidemia Father   . Hypertension Father   . Depression Sister   . Depression Son   . Breast cancer Maternal Grandmother      Current medication list and allergy/intolerance information reviewed:    Current Outpatient Medications  Medication Sig Dispense Refill  . amphetamine-dextroamphetamine (ADDERALL) 10 MG tablet Take 1 tablet (10 mg total) by mouth 2 (two) times daily. 60 tablet 0  . escitalopram (LEXAPRO) 20 MG tablet Take 1 tablet (20 mg total) by mouth daily. 90 tablet 3  . ondansetron (ZOFRAN-ODT) 8 MG disintegrating tablet   1  . oxyCODONE-acetaminophen (PERCOCET/ROXICET) 5-325 MG tablet Take by mouth.    . pantoprazole (PROTONIX) 40 MG tablet   1   No current facility-administered  medications for this visit.     Allergies  Allergen Reactions  . Nitrofurantoin Hives  . Macrobid [Nitrofurantoin Macrocrystal] Hives  . Tetanus Toxoids Swelling    Pt states she was told the Tdap injection would have to be administered in the hospital  After discussion on 11/10 pt remembers being told it was thimersol in the tetanus 30+years ago that caused the swelling.       Review of Systems:  Constitutional:  No   fever, no chills, No recent illness  HEENT: No  headache, no vision change  Cardiac: No  chest pain, No  pressure, +palpitations  Respiratory:  No  shortness of breath. No  Cough  Neurologic: No  weakness, No  dizziness,   Psychiatric: No  concerns with depression, No  concerns with anxiety, No sleep problems, No mood problems  Exam:  BP (!) 130/59   Pulse 73   Temp 98.1 F (36.7 C) (Oral)   Wt 196 lb 1.9 oz (89 kg)   BMI 31.65 kg/m   Constitutional: VS see above. General Appearance: alert, well-developed, well-nourished, NAD  Eyes: Normal lids and conjunctive, non-icteric sclera  Ears, Nose, Mouth, Throat: MMM, Normal external inspection ears/nares/mouth/lips/gums.   Neck: No masses, trachea midline.  Respiratory: Normal respiratory effort. no wheeze, no rhonchi, no rales  Cardiovascular: S1/S2 normal, no murmur, no rub/gallop auscultated. RRR. No lower extremity edema.   Musculoskeletal: Gait normal.  Neurological: Normal balance/coordination. No tremor  Skin: warm, dry, intact. No rash/ulcer   Psychiatric: Normal judgment/insight. Normal mood and affect. Oriented x3.     ASSESSMENT/PLAN: The encounter diagnosis was Situational anxiety.  Discussed short-term, as-needed-only use of benzodiazepine anxiolytics, risks vs benefits of medication, possible sedation effects, risks of dependence, plan for short-term treatment and close follow-up   Meds ordered this encounter  Medications  . clonazePAM (KLONOPIN) 0.5 MG tablet    Sig: Take 1 tablet (0.5 mg total) by mouth 2 (two) times daily as needed for anxiety.    Dispense:  15 tablet    Refill:  1     Visit summary with medication list and pertinent instructions was printed for patient to review. All questions at time of visit were answered - patient instructed to contact office with any additional concerns. ER/RTC precautions were reviewed with the patient.   Follow-up plan: Return for recheck anxiety in one  month. .  Note: Total time spent 25 minutes, greater than 50% of the visit was spent face-to-face counseling and coordinating care for the following: The encounter diagnosis was Situational anxiety..  Please note: voice recognition software was used to produce this document, and typos may escape review. Please contact Dr. Sheppard Coil for any needed clarifications.

## 2017-05-04 ENCOUNTER — Ambulatory Visit (INDEPENDENT_AMBULATORY_CARE_PROVIDER_SITE_OTHER): Payer: 59 | Admitting: Osteopathic Medicine

## 2017-05-04 ENCOUNTER — Encounter: Payer: Self-pay | Admitting: Osteopathic Medicine

## 2017-05-04 VITALS — BP 120/78 | HR 66 | Temp 97.7°F | Wt 195.1 lb

## 2017-05-04 DIAGNOSIS — K589 Irritable bowel syndrome without diarrhea: Secondary | ICD-10-CM | POA: Insufficient documentation

## 2017-05-04 DIAGNOSIS — F418 Other specified anxiety disorders: Secondary | ICD-10-CM

## 2017-05-04 NOTE — Progress Notes (Signed)
HPI: Sarah Nicholson is a 47 y.o. female who  has a past medical history of ADD (attention deficit disorder) (06/14/2012), Depression (06/14/2012), History of leukocytosis (06/14/2012), Hyperlipidemia (06/14/2012), Prediabetes (06/14/2012), and Seasonal allergies (06/14/2012).  she presents to [facility] today, 05/04/17,  for chief complaint of:  No chief complaint on file.    Severe stress and anxiety lately, Had been following with LCSW downstairs at behavioral health last seen 07/2016 - had been working on anxiety issues, social anxiety. Was doing a lot better but situational factors prompted her to seek treatment for medications as needed anxiety.   Having some difficulties with her husband, suspicious of him possibly being in contact with a woman who he almost called off their wedding for. Her plan at last visit was to see how the next 10-14 days ago, requested some medication to help get her through that time, if still feeling like she is having trouble coping or not sure what to do, she had planned reschedule with behavioral health downstairs.   Was having occasional panic symptoms where she had shortness of breath and tightness in the chest when she thought too much about her situation. No exertional dyspnea or chest pressure otherwise  Last visit we started clonazepam for as-needed use, situation factors made patient resistant to starting new daily augmentation medications such as abilify or wellbutrin. She's doing well on prn clonazepam, 1/2 tablet took daily for about a week then nothing needed since then, has most of the bottle left.    Depression screen [facility] 2/9 05/04/2017 06/21/2016  Decreased Interest 1 2  Down, Depressed, Hopeless 1 2  PHQ - 2 Score 2 4  Altered sleeping 0 2  Tired, decreased energy 1 2  Change in appetite 1 2  Feeling bad or failure about yourself  1 2  Trouble concentrating 0 2  Moving slowly or fidgety/restless 0 1  Suicidal  thoughts 0 1  PHQ-9 Score 5 16  Difficult doing work/chores Not difficult at all -  Some encounter information is confidential and restricted. Go to Review Flowsheets activity to see all data.   No flowsheet data found.        Past medical, surgical, social and family history reviewed:  Patient Active Problem List   Diagnosis Date Noted  . Irritable bowel syndrome (IBS) 05/04/2017  . Generalized anxiety disorder 03/16/2016  . Social anxiety disorder 03/16/2016  . Major depressive disorder, recurrent episode, moderate ([place]) 03/16/2016  . Right inguinal hernia 07/14/2015  . Hallux valgus 12/09/2014  . Acute headache 07/31/2014  . Chest pain 07/31/2014  . Hypotension 07/31/2014  . Other and unspecified ovarian cyst 06/25/2013  . Left tennis elbow 06/25/2013  . Left breast mass 06/27/2012  . Microscopic hematuria 06/27/2012  . Hyperlipidemia 06/14/2012  . Prediabetes 06/14/2012  . History of leukocytosis 06/14/2012  . Seasonal allergies 06/14/2012  . Depression 06/14/2012  . ADD (attention deficit disorder) 06/14/2012    Past Surgical History:  Procedure Laterality Date  . APPENDECTOMY    . bladder sling/mesh    . INGUINAL HERNIA REPAIR    . intrauterine ablation    . NASAL SINUS SURGERY    . TUBAL LIGATION      Social History   Tobacco Use  . Smoking status: Current Every Day Smoker  . Smokeless tobacco: Never Used  Substance Use Topics  . Alcohol use: Yes    Comment: 1-2 drinks every 6 months    Family History  Problem Relation  Age of Onset  . Depression Mother   . Diabetes Father   . Hyperlipidemia Father   . Hypertension Father   . Depression Sister   . Depression Son   . Breast cancer Maternal Grandmother      Current medication list and allergy/intolerance information reviewed:    Current Outpatient Medications  Medication Sig Dispense Refill  . amphetamine-dextroamphetamine (ADDERALL) 10 MG tablet Take 1 tablet (10 mg total) by mouth 2 (two)  times daily. 60 tablet 0  . clonazePAM (KLONOPIN) 0.5 MG tablet Take 1 tablet (0.5 mg total) by mouth 2 (two) times daily as needed for anxiety. 15 tablet 1  . escitalopram (LEXAPRO) 20 MG tablet Take 1 tablet (20 mg total) by mouth daily. 90 tablet 3   No current facility-administered medications for this visit.     Allergies  Allergen Reactions  . Nitrofurantoin Hives  . Macrobid [Nitrofurantoin Macrocrystal] Hives  . Tetanus Toxoids Swelling    Pt states she was told the Tdap injection would have to be administered in the hospital  After discussion on 11/10 pt remembers being told it was thimersol in the tetanus 30+years ago that caused the swelling.       Review of Systems:  Constitutional:  No  fever, no chills, No recent illness  HEENT: No  headache, no vision change  Cardiac: No  chest pain, No  pressure, +palpitations  Respiratory:  No  shortness of breath. No  Cough  Neurologic: No  weakness, No  dizziness,   Psychiatric: No  concerns with depression, No  concerns with anxiety, No sleep problems, No mood problems  Exam:  BP 120/78   Pulse 66   Temp 97.7 F (36.5 C) (Oral)   Wt 195 lb 1.9 oz (88.5 kg)   BMI 31.49 kg/m   Constitutional: VS see above. General Appearance: alert, well-developed, well-nourished, NAD  Respiratory: Normal respiratory effort.  Cardiovascular: S1/S2 normal, no murmur, no rub/gallop auscultated. RRR.  Musculoskeletal: Gait normal.  Neurological: Normal balance/coordination. No tremor   Skin: warm, dry, intact. No rash/ulcer   Psychiatric: Normal judgment/insight. Normal mood and affect. Oriented x3.     ASSESSMENT/PLAN: The primary encounter diagnosis was Situational anxiety. A diagnosis of Irritable bowel syndrome, unspecified type was also pertinent to this visit.  Discussed short-term, as-needed-only use of benzodiazepine anxiolytics, risks vs benefits of medication, possible sedation effects, risks of dependence, plan for  short-term treatment and close follow-up. Advised seek counseling if needed - we should follow up in a few months to touch base on this issue, have pharmacy send refill requests in the meantime if needed      Visit summary with medication list and pertinent instructions was printed for patient to review. All questions at time of visit were answered - patient instructed to contact office with any additional concerns. ER/RTC precautions were reviewed with the patient.   Follow-up plan: Return in about 3 months (around 08/01/2017) for recheck anxiety / med refills if needed .    Please note: voice recognition software was used to produce this document, and typos may escape review. Please contact Dr. Sheppard Coil for any needed clarifications.

## 2017-06-21 ENCOUNTER — Encounter: Payer: Self-pay | Admitting: Osteopathic Medicine

## 2017-06-21 ENCOUNTER — Ambulatory Visit (INDEPENDENT_AMBULATORY_CARE_PROVIDER_SITE_OTHER): Payer: 59 | Admitting: Osteopathic Medicine

## 2017-06-21 ENCOUNTER — Ambulatory Visit (INDEPENDENT_AMBULATORY_CARE_PROVIDER_SITE_OTHER): Payer: 59

## 2017-06-21 VITALS — BP 150/80 | HR 83 | Temp 98.4°F | Wt 200.1 lb

## 2017-06-21 DIAGNOSIS — R300 Dysuria: Secondary | ICD-10-CM

## 2017-06-21 DIAGNOSIS — R1031 Right lower quadrant pain: Secondary | ICD-10-CM

## 2017-06-21 DIAGNOSIS — R109 Unspecified abdominal pain: Secondary | ICD-10-CM | POA: Diagnosis not present

## 2017-06-21 DIAGNOSIS — Z87442 Personal history of urinary calculi: Secondary | ICD-10-CM

## 2017-06-21 DIAGNOSIS — I700 Atherosclerosis of aorta: Secondary | ICD-10-CM

## 2017-06-21 LAB — POCT URINALYSIS DIPSTICK
Bilirubin, UA: NEGATIVE
GLUCOSE UA: NEGATIVE
Ketones, UA: NEGATIVE
LEUKOCYTES UA: NEGATIVE
Nitrite, UA: NEGATIVE
Protein, UA: NEGATIVE
Spec Grav, UA: 1.015 (ref 1.010–1.025)
Urobilinogen, UA: 0.2 E.U./dL
pH, UA: 6 (ref 5.0–8.0)

## 2017-06-21 MED ORDER — OXYCODONE-ACETAMINOPHEN 5-325 MG PO TABS: 1.0000 | ORAL_TABLET | Freq: Four times a day (QID) | ORAL | 0 refills | Status: AC

## 2017-06-21 MED ORDER — TAMSULOSIN HCL 0.4 MG PO CAPS: 0.4000 mg | ORAL_CAPSULE | Freq: Every day | ORAL | 0 refills | Status: DC

## 2017-06-21 NOTE — Progress Notes (Addendum)
HPI: Sarah Nicholson is a 47 y.o. female who  has a past medical history of ADD (attention deficit disorder) (06/14/2012), Depression (06/14/2012), History of leukocytosis (06/14/2012), Hyperlipidemia (06/14/2012), Prediabetes (06/14/2012), and Seasonal allergies (06/14/2012).  she presents to [facility] today, 06/21/17,  for chief complaint of:  Concern for kidney stones   Pelvic pain has been ongoing since 06/08/17, painful urination began today. Patient has a history of kidney stones. On the right side/flank, migrating down into the right pelvis. Feels like previous kidney stones.     Past medical history, surgical history, social history and family history reviewed.   Current medication list and allergy/intolerance information reviewed.    Current Outpatient Medications on File Prior to Visit  Medication Sig Dispense Refill  . amphetamine-dextroamphetamine (ADDERALL) 10 MG tablet Take 1 tablet (10 mg total) by mouth 2 (two) times daily. 60 tablet 0  . clonazePAM (KLONOPIN) 0.5 MG tablet Take 1 tablet (0.5 mg total) by mouth 2 (two) times daily as needed for anxiety. 15 tablet 1  . escitalopram (LEXAPRO) 20 MG tablet Take 1 tablet (20 mg total) by mouth daily. 90 tablet 3   No current facility-administered medications on file prior to visit.    Allergies  Allergen Reactions  . Nitrofurantoin Hives  . Macrobid [Nitrofurantoin Macrocrystal] Hives  . Tetanus Toxoids Swelling    Pt states she was told the Tdap injection would have to be administered in the hospital  After discussion on 11/10 pt remembers being told it was thimersol in the tetanus 30+years ago that caused the swelling.       Review of Systems:  Constitutional: No recent illness  HEENT: No  headache, no vision change  Cardiac: No  chest pain, No  pressure, No palpitations  Respiratory:  No  shortness of breath. No  Cough  Gastrointestinal: +abdominal pain, no change on bowel  habits  Neurologic: No  weakness, No  Dizziness   Exam:  BP (!) 150/80 (BP Location: Left Arm, Patient Position: Sitting, Cuff Size: Large)   Pulse 83   Temp 98.4 F (36.9 C) (Oral)   Wt 200 lb 1.9 oz (90.8 kg)   BMI 32.30 kg/m   Constitutional: VS see above. General Appearance: alert, well-developed, well-nourished, NAD  Eyes: Normal lids and conjunctive, non-icteric sclera  Ears, Nose, Mouth, Throat: MMM, Normal external inspection ears/nares/mouth/lips/gums.  Neck: No masses, trachea midline.   Respiratory: Normal respiratory effort. no wheeze, no rhonchi, no rales  Cardiovascular: S1/S2 normal, no murmur, no rub/gallop auscultated. RRR.   Musculoskeletal: Gait normal. Symmetric and independent movement of all extremities  Skin: warm, dry, intact.   Psychiatric: Normal judgment/insight. Normal mood and affect. Oriented x3.   Results for orders placed or performed in visit on 06/21/17 (from the past 24 hour(s))  POCT Urinalysis Dipstick     Status: None   Collection Time: 06/21/17  3:38 PM  Result Value Ref Range   Color, UA YELLOW    Clarity, UA CLEAR    Glucose, UA NEGATIVE    Bilirubin, UA NEGATIVE    Ketones, UA NEGATIVE    Spec Grav, UA 1.015 1.010 - 1.025   Blood, UA LARGE    pH, UA 6.0 5.0 - 8.0   Protein, UA NEGATIVE    Urobilinogen, UA 0.2 0.2 or 1.0 E.U./dL   Nitrite, UA NEGATIVE    Leukocytes, UA Negative Negative   Appearance     Odor     Ct Renal Stone Study  Result Date: 06/21/2017 CLINICAL DATA:  RIGHT flank pain since March 28th. History of stones and ablation. EXAM: CT ABDOMEN AND PELVIS WITHOUT CONTRAST TECHNIQUE: Multidetector CT imaging of the abdomen and pelvis was performed following the standard protocol without IV contrast. COMPARISON:  CT abdomen dated 11/24/2016. FINDINGS: Lower chest: No acute abnormality. Hepatobiliary: No focal liver abnormality is seen. No gallstones, gallbladder wall thickening, or biliary dilatation. Pancreas:  Unremarkable. No pancreatic ductal dilatation or surrounding inflammatory changes. Spleen: Normal in size without focal abnormality. Adrenals/Urinary Tract: Adrenal glands appear normal. Kidneys are unremarkable without mass, stone or hydronephrosis. No perinephric inflammation seen. No ureteral or bladder calculi identified. Bladder appears normal, partially decompressed. Stomach/Bowel: Bowel is normal in caliber. No bowel wall thickening or evidence of bowel wall inflammation seen. Presumed appendectomy. Stomach appears normal. Vascular/Lymphatic: Aortic atherosclerosis. No enlarged abdominal or pelvic lymph nodes. Reproductive: Small LEFT ovarian cyst versus normal dominant follicle, measuring 3 cm. No free fluid or inflammation within the adjacent LEFT adnexal region. No mass or free fluid in the RIGHT adnexal region. Other: No free fluid or abscess collection. No free intraperitoneal air. Musculoskeletal: Mild degenerative change in the lower lumbar spine. No acute or suspicious osseous finding. Superficial soft tissues are unremarkable. IMPRESSION: 1. No acute findings. No renal or ureteral calculi. No hydronephrosis or perinephric inflammation. No bowel obstruction or evidence of bowel wall inflammation. 2. Aortic atherosclerosis. 3. Additional chronic/incidental findings detailed above. Electronically Signed   By: Franki Cabot M.D.   On: 06/21/2017 16:14     ASSESSMENT/PLAN:   Right lower quadrant abdominal pain - Plan: CT RENAL STONE STUDY  Flank pain - Plan: POCT Urinalysis Dipstick, Urine Culture, Urinalysis, microscopic only, oxyCODONE-acetaminophen (PERCOCET/ROXICET) 5-325 MG tablet, tamsulosin (FLOMAX) 0.4 MG CAPS capsule  Dysuria - Plan: POCT Urinalysis Dipstick, Urine Culture, Urinalysis, microscopic only   Meds ordered this encounter  Medications  . oxyCODONE-acetaminophen (PERCOCET/ROXICET) 5-325 MG tablet    Sig: Take 1-2 tablets by mouth every 6 (six) hours for 5 days.     Dispense:  30 tablet    Refill:  0  . tamsulosin (FLOMAX) 0.4 MG CAPS capsule    Sig: Take 1 capsule (0.4 mg total) by mouth daily.    Dispense:  14 capsule    Refill:  0    Patient Instructions  If small enough to pass, will see if it does. If no, will send urgent referral to urology. Prescriptions for pain medications and Flomax to help in the meantime. If worse, may require hospitalization for pain control and further evaluation - please go to ER if worse/change!     Kidney Stones Kidney stones (urolithiasis) are solid, rock-like deposits that form inside of the organs that make urine (kidneys). A kidney stone may form in a kidney and move into the bladder, where it can cause intense pain and block the flow of urine. Kidney stones are created when high levels of certain minerals are found in the urine. They are usually passed through urination, but in some cases, medical treatment may be needed to remove them. What are the causes? Kidney stones may be caused by:  A condition in which certain glands produce too much parathyroid hormone (primary hyperparathyroidism), which causes too much calcium buildup in the blood.  Buildup of uric acid crystals in the bladder (hyperuricosuria). Uric acid is a chemical that the body produces when you eat certain foods. It usually exits the body in the urine.  Narrowing (stricture) of one or both of the  tubes that drain urine from the kidneys to the bladder (ureters).  A kidney blockage that is present at birth (congenital obstruction).  Past surgery on the kidney or the ureters, such as gastric bypass surgery.  What increases the risk? The following factors make you more likely to develop kidney stones:  Having had a kidney stone in the past.  Having a family history of kidney stones.  Not drinking enough water.  Eating a diet that is high in protein, salt (sodium), or sugar.  Being overweight or obese.  What are the signs or  symptoms? Symptoms of a kidney stone may include:  Nausea.  Vomiting.  Blood in the urine (hematuria).  Pain in the side of the abdomen, right below the ribs (flank pain). Pain usually spreads (radiates) to the groin.  Needing to urinate frequently or urgently.  How is this diagnosed? This condition may be diagnosed based on:  Your medical history.  A physical exam.  Blood tests.  Urine tests.  CT scan.  Abdominal X-ray.  A procedure to examine the inside of the bladder (cystoscopy).  How is this treated? Treatment for kidney stones depends on the size, location, and makeup of the stones. Treatment may involve:  Analyzing your urine before and after you pass the stone through urination.  Being monitored at the hospital until you pass the stone through urination.  Increasing your fluid intake and decreasing the amount of calcium and protein in your diet.  A procedure to break up kidney stones in the bladder using: ? A focused beam of light (laser therapy). ? Shock waves (extracorporeal shock wave lithotripsy).  Surgery to remove kidney stones. This may be needed if you have severe pain or have stones that block your urinary tract.  Follow these instructions at home: Eating and drinking   Drink enough fluid to keep your urine clear or pale yellow. This will help you to pass the kidney stone.  If directed, change your diet. This may include: ? Limiting how much sodium you eat. ? Eating more fruits and vegetables. ? Limiting how much meat, poultry, fish, and eggs you eat.  Follow instructions from your health care provider about eating or drinking restrictions. General instructions  Collect urine samples as told by your health care provider. You may need to collect a urine sample: ? 24 hours after you pass the stone. ? 8-12 weeks after passing the kidney stone, and every 6-12 months after that.  Strain your urine every time you urinate, for as long as  directed. Use the strainer that your health care provider recommends.  Do not throw out the kidney stone after passing it. Keep the stone so it can be tested by your health care provider. Testing the makeup of your kidney stone may help prevent you from getting kidney stones in the future.  Take over-the-counter and prescription medicines only as told by your health care provider.  Keep all follow-up visits as told by your health care provider. This is important. You may need follow-up X-rays or ultrasounds to make sure that your stone has passed. How is this prevented? To prevent another kidney stone:  Drink enough fluid to keep your urine clear or pale yellow. This is the best way to prevent kidney stones.  Eat a healthy diet and follow recommendations from your health care provider about foods to avoid. You may be instructed to eat a low-protein diet. Recommendations vary depending on the type of kidney stone that you have.  Maintain a healthy weight.  Contact a health care provider if:  You have pain that gets worse or does not get better with medicine. Get help right away if:  You have a fever or chills.  You develop severe pain.  You develop new abdominal pain.  You faint.  You are unable to urinate. This information is not intended to replace advice given to you by your health care provider. Make sure you discuss any questions you have with your health care provider. Document Released: 02/28/2005 Document Revised: 09/18/2015 Document Reviewed: 08/14/2015 Elsevier Interactive Patient Education  [employer].     Follow-up plan: Return for recheck depending on results/symptoms - will inform of CT results once available.  Visit summary with medication list and pertinent instructions was printed for patient to review, alert [place] if any changes needed. All questions at time of visit were answered - patient instructed to contact office with any additional concerns. ER/RTC  precautions were reviewed with the patient and understanding verbalized.    Please note: voice recognition software was used to produce this document, and typos may escape review. Please contact Dr. Sheppard Coil for any needed clarifications.

## 2017-06-21 NOTE — Patient Instructions (Signed)
If small enough to pass, will see if it does. If no, will send urgent referral to urology. Prescriptions for pain medications and Flomax to help in the meantime. If worse, may require hospitalization for pain control and further evaluation - please go to ER if worse/change!     Kidney Stones Kidney stones (urolithiasis) are solid, rock-like deposits that form inside of the organs that make urine (kidneys). A kidney stone may form in a kidney and move into the bladder, where it can cause intense pain and block the flow of urine. Kidney stones are created when high levels of certain minerals are found in the urine. They are usually passed through urination, but in some cases, medical treatment may be needed to remove them. What are the causes? Kidney stones may be caused by:  A condition in which certain glands produce too much parathyroid hormone (primary hyperparathyroidism), which causes too much calcium buildup in the blood.  Buildup of uric acid crystals in the bladder (hyperuricosuria). Uric acid is a chemical that the body produces when you eat certain foods. It usually exits the body in the urine.  Narrowing (stricture) of one or both of the tubes that drain urine from the kidneys to the bladder (ureters).  A kidney blockage that is present at birth (congenital obstruction).  Past surgery on the kidney or the ureters, such as gastric bypass surgery.  What increases the risk? The following factors make you more likely to develop kidney stones:  Having had a kidney stone in the past.  Having a family history of kidney stones.  Not drinking enough water.  Eating a diet that is high in protein, salt (sodium), or sugar.  Being overweight or obese.  What are the signs or symptoms? Symptoms of a kidney stone may include:  Nausea.  Vomiting.  Blood in the urine (hematuria).  Pain in the side of the abdomen, right below the ribs (flank pain). Pain usually spreads (radiates) to  the groin.  Needing to urinate frequently or urgently.  How is this diagnosed? This condition may be diagnosed based on:  Your medical history.  A physical exam.  Blood tests.  Urine tests.  CT scan.  Abdominal X-ray.  A procedure to examine the inside of the bladder (cystoscopy).  How is this treated? Treatment for kidney stones depends on the size, location, and makeup of the stones. Treatment may involve:  Analyzing your urine before and after you pass the stone through urination.  Being monitored at the hospital until you pass the stone through urination.  Increasing your fluid intake and decreasing the amount of calcium and protein in your diet.  A procedure to break up kidney stones in the bladder using: ? A focused beam of light (laser therapy). ? Shock waves (extracorporeal shock wave lithotripsy).  Surgery to remove kidney stones. This may be needed if you have severe pain or have stones that block your urinary tract.  Follow these instructions at home: Eating and drinking   Drink enough fluid to keep your urine clear or pale yellow. This will help you to pass the kidney stone.  If directed, change your diet. This may include: ? Limiting how much sodium you eat. ? Eating more fruits and vegetables. ? Limiting how much meat, poultry, fish, and eggs you eat.  Follow instructions from your health care provider about eating or drinking restrictions. General instructions  Collect urine samples as told by your health care provider. You may need to collect a  urine sample: ? 24 hours after you pass the stone. ? 8-12 weeks after passing the kidney stone, and every 6-12 months after that.  Strain your urine every time you urinate, for as long as directed. Use the strainer that your health care provider recommends.  Do not throw out the kidney stone after passing it. Keep the stone so it can be tested by your health care provider. Testing the makeup of your  kidney stone may help prevent you from getting kidney stones in the future.  Take over-the-counter and prescription medicines only as told by your health care provider.  Keep all follow-up visits as told by your health care provider. This is important. You may need follow-up X-rays or ultrasounds to make sure that your stone has passed. How is this prevented? To prevent another kidney stone:  Drink enough fluid to keep your urine clear or pale yellow. This is the best way to prevent kidney stones.  Eat a healthy diet and follow recommendations from your health care provider about foods to avoid. You may be instructed to eat a low-protein diet. Recommendations vary depending on the type of kidney stone that you have.  Maintain a healthy weight.  Contact a health care provider if:  You have pain that gets worse or does not get better with medicine. Get help right away if:  You have a fever or chills.  You develop severe pain.  You develop new abdominal pain.  You faint.  You are unable to urinate. This information is not intended to replace advice given to you by your health care provider. Make sure you discuss any questions you have with your health care provider. Document Released: 02/28/2005 Document Revised: 09/18/2015 Document Reviewed: 08/14/2015 Elsevier Interactive Patient Education  [employer].

## 2017-06-23 LAB — URINE CULTURE
MICRO NUMBER:: 90444273
SPECIMEN QUALITY:: ADEQUATE

## 2017-06-23 LAB — URINALYSIS, MICROSCOPIC ONLY
Bacteria, UA: NONE SEEN /HPF
Hyaline Cast: NONE SEEN /LPF
RBC / HPF: NONE SEEN /HPF (ref 0–2)
WBC UA: NONE SEEN /HPF (ref 0–5)

## 2017-07-04 ENCOUNTER — Other Ambulatory Visit: Payer: Self-pay | Admitting: Osteopathic Medicine

## 2017-07-04 DIAGNOSIS — F411 Generalized anxiety disorder: Secondary | ICD-10-CM

## 2017-07-04 DIAGNOSIS — F329 Major depressive disorder, single episode, unspecified: Secondary | ICD-10-CM

## 2017-07-04 DIAGNOSIS — F32A Depression, unspecified: Secondary | ICD-10-CM

## 2017-08-01 ENCOUNTER — Ambulatory Visit: Payer: Self-pay | Admitting: Osteopathic Medicine

## 2017-08-08 ENCOUNTER — Encounter: Payer: Self-pay | Admitting: Osteopathic Medicine

## 2017-08-08 ENCOUNTER — Ambulatory Visit (INDEPENDENT_AMBULATORY_CARE_PROVIDER_SITE_OTHER): Payer: 59 | Admitting: Osteopathic Medicine

## 2017-08-08 VITALS — BP 133/84 | HR 64 | Temp 97.9°F | Wt 197.8 lb

## 2017-08-08 DIAGNOSIS — F988 Other specified behavioral and emotional disorders with onset usually occurring in childhood and adolescence: Secondary | ICD-10-CM

## 2017-08-08 DIAGNOSIS — F411 Generalized anxiety disorder: Secondary | ICD-10-CM

## 2017-08-08 DIAGNOSIS — X32XXXA Exposure to sunlight, initial encounter: Secondary | ICD-10-CM | POA: Diagnosis not present

## 2017-08-08 DIAGNOSIS — L570 Actinic keratosis: Secondary | ICD-10-CM | POA: Diagnosis not present

## 2017-08-08 DIAGNOSIS — L729 Follicular cyst of the skin and subcutaneous tissue, unspecified: Secondary | ICD-10-CM | POA: Insufficient documentation

## 2017-08-08 MED ORDER — AMPHETAMINE-DEXTROAMPHETAMINE 10 MG PO TABS: 10.0000 mg | ORAL_TABLET | Freq: Two times a day (BID) | ORAL | 0 refills | Status: DC

## 2017-08-08 NOTE — Patient Instructions (Addendum)
[address] ([address], [place], [place] [address], [phone]) for immediate mental health services. Any emergency room will also be able to help. You can always call the office, too!   I messages Judson Roch to get her recommendations for a couples counselor, she or I will contact you with some names soon.   OK to fill the clonazepam, let me know if/when you need additional refills.   Let's recheck in a month or so, sooner if needed, to see how you're doing!

## 2017-08-08 NOTE — Progress Notes (Signed)
HPI: Sarah Nicholson is a 47 y.o. female who  has a past medical history of ADD (attention deficit disorder) (06/14/2012), Depression (06/14/2012), History of leukocytosis (06/14/2012), Hyperlipidemia (06/14/2012), Prediabetes (06/14/2012), and Seasonal allergies (06/14/2012).  she presents to [facility] today, 08/08/17,  for chief complaint of:  Anxiety    Severe stress and anxiety past several months. Had previously been following with LCSW downstairs at behavioral health last seen 07/2016 - had been working on anxiety issues, social anxiety. Was doing a lot better but situational factors prompted her to seek treatment for medications as needed anxiety - initial visit 03/2017. Had been on Lexapro since 2014.   Having some difficulties with her husband, suspicious of him possibly being in contact with a woman who he almost called off their wedding for. Her plan at our initial visit for this issue was to see how the next 10-14 days ago, requested some medication to help get her through that time, if still feeling like she is having trouble coping or not sure what to do, she had planned reschedule with behavioral health downstairs.   Was having occasional panic symptoms where she had shortness of breath and tightness in the chest when she thought too much about her situation. No exertional dyspnea or chest pressure otherwise  We started clonazepam for as-needed use, situation factors made patient resistant to starting new daily augmentation medications such as abilify or wellbutrin. She was doing well on prn clonazepam, still has some left from 03/2017 fill. We also started Lexapro   Today reports she has separated from husband, under a lot of stress due to this. Discussed self-reporting questionnaire below - she reports passive death witsh, a lot of guilt over being in the same situation w/ her husband as she was a fwe years ago, urge to burn or cut herself but she  doesn't act on these urges. She has a good support system with her mom and a few old coworkers who are good friends. She states she's never actually hurt herself. She has history of voluntary hospitalization years ago and she feels okay to take herself to the ER or other mental health facility if needed.   Few skin spots she'd like me to look at as well today.    Depression screen [facility] 2/9 08/08/2017 05/04/2017 06/21/2016  Decreased Interest 3 1 2   Down, Depressed, Hopeless 3 1 2   PHQ - 2 Score 6 2 4   Altered sleeping 2 0 2  Tired, decreased energy 2 1 2   Change in appetite 2 1 2   Feeling bad or failure about yourself  2 1 2   Trouble concentrating 3 0 2  Moving slowly or fidgety/restless 1 0 1  Suicidal thoughts 2 0 1  PHQ-9 Score 20 5 16   Difficult doing work/chores Very difficult Not difficult at all -  Some encounter information is confidential and restricted. Go to Review Flowsheets activity to see all data.   GAD 7 : Generalized Anxiety Score 08/08/2017  Nervous, Anxious, on Edge 3  Control/stop worrying 3  Worry too much - different things 3  Trouble relaxing 3  Restless 2  Easily annoyed or irritable 1  Afraid - awful might happen 1  Total GAD 7 Score 16  Anxiety Difficulty Very difficult  Some encounter information is confidential and restricted. Go to Review Flowsheets activity to see all data.       Past medical, surgical, social and family history reviewed:  Patient Active Problem List  Diagnosis Date Noted  . Irritable bowel syndrome (IBS) 05/04/2017  . Generalized anxiety disorder 03/16/2016  . Social anxiety disorder 03/16/2016  . Major depressive disorder, recurrent episode, moderate ([place]) 03/16/2016  . Right inguinal hernia 07/14/2015  . Hallux valgus 12/09/2014  . Acute headache 07/31/2014  . Chest pain 07/31/2014  . Hypotension 07/31/2014  . Other and unspecified ovarian cyst 06/25/2013  . Left tennis elbow 06/25/2013  . Left breast mass 06/27/2012  .  Microscopic hematuria 06/27/2012  . Hyperlipidemia 06/14/2012  . Prediabetes 06/14/2012  . History of leukocytosis 06/14/2012  . Seasonal allergies 06/14/2012  . Depression 06/14/2012  . ADD (attention deficit disorder) 06/14/2012    Past Surgical History:  Procedure Laterality Date  . APPENDECTOMY    . bladder sling/mesh    . INGUINAL HERNIA REPAIR    . intrauterine ablation    . NASAL SINUS SURGERY    . TUBAL LIGATION      Social History   Tobacco Use  . Smoking status: Current Every Day Smoker  . Smokeless tobacco: Never Used  Substance Use Topics  . Alcohol use: Yes    Comment: 1-2 drinks every 6 months    Family History  Problem Relation Age of Onset  . Depression Mother   . Diabetes Father   . Hyperlipidemia Father   . Hypertension Father   . Depression Sister   . Depression Son   . Breast cancer Maternal Grandmother      Current medication list and allergy/intolerance information reviewed:    Current Outpatient Medications  Medication Sig Dispense Refill  . clonazePAM (KLONOPIN) 0.5 MG tablet Take 1 tablet (0.5 mg total) by mouth 2 (two) times daily as needed for anxiety. 15 tablet 1  . escitalopram (LEXAPRO) 20 MG tablet TAKE 1 TABLET(20 MG) BY MOUTH DAILY 90 tablet 0  . tamsulosin (FLOMAX) 0.4 MG CAPS capsule Take 1 capsule (0.4 mg total) by mouth daily. 14 capsule 0  . amphetamine-dextroamphetamine (ADDERALL) 10 MG tablet Take 1 tablet (10 mg total) by mouth 2 (two) times daily. 60 tablet 0   No current facility-administered medications for this visit.     Allergies  Allergen Reactions  . Nitrofurantoin Hives  . Macrobid [Nitrofurantoin Macrocrystal] Hives  . Tetanus Toxoids Swelling    Pt states she was told the Tdap injection would have to be administered in the hospital  After discussion on 11/10 pt remembers being told it was thimersol in the tetanus 30+years ago that caused the swelling.       Review of Systems:  Constitutional:  No   fever, no chills, No recent illness  HEENT: No  headache  Cardiac: No  chest pain  Neurologic: No  weakness, No  dizziness,   Psychiatric: +concerns with depression, + concerns with anxiety, +sleep problems, +mood problems  Exam:  BP 133/84 (BP Location: Left Arm, Patient Position: Sitting, Cuff Size: Normal)   Pulse 64   Temp 97.9 F (36.6 C) (Oral)   Wt 197 lb 12.8 oz (89.7 kg)   BMI 31.93 kg/m   Constitutional: VS see above. General Appearance: alert, well-developed, well-nourished, NAD  Respiratory: Normal respiratory effort.  Cardiovascular: S1/S2 normal, no murmur, no rub/gallop auscultated. RRR.  Musculoskeletal: Gait normal.  Neurological: Normal balance/coordination. No tremor   Skin: warm, dry, intact. No rash/ulcer, R shoulder some rough skin papule c/w actinic damage, L lower leg small subq cyst.   Psychiatric: Normal judgment/insight. Normal mood and affect. Oriented x3.     ASSESSMENT/PLAN: The  primary encounter diagnosis was Generalized anxiety disorder. A diagnosis of Attention deficit disorder, unspecified hyperactivity presence was also pertinent to this visit.  Patient Instructions  [facility] ([address], [place], [place] [address], [phone]) for immediate mental health services. Any emergency room will also be able to help. You can always call the office, too!   I messages Judson Roch to get her recommendations for a couples counselor, she or I will contact you with some names soon.   OK to fill the clonazepam, let me know if/when you need additional refills.   Let's recheck in a month or so, sooner if needed, to see how you're doing!         Visit summary with medication list and pertinent instructions was printed for patient to review. All questions at time of visit were answered - patient instructed to contact office with any additional concerns. ER/RTC precautions were reviewed with the patient.   Follow-up plan: Return in about  1 month (around 09/05/2017) for recheck mental health, sooner if needed .  Total time spent: 25 mins, >50% cousel/coordinate care for The primary encounter diagnosis was Generalized anxiety disorder. Diagnoses of Attention deficit disorder, unspecified hyperactivity presence, Sun-induced skin changes, keratosis, and Subcutaneous cyst were also pertinent to this visit.    Please note: voice recognition software was used to produce this document, and typos may escape review. Please contact Dr. Sheppard Coil for any needed clarifications.

## 2017-08-10 ENCOUNTER — Telehealth: Payer: Self-pay | Admitting: Osteopathic Medicine

## 2017-08-10 ENCOUNTER — Encounter: Payer: Self-pay | Admitting: Osteopathic Medicine

## 2017-08-10 NOTE — Telephone Encounter (Signed)
Called pt and notified of My chart message. KG LPN

## 2017-08-10 NOTE — Telephone Encounter (Signed)
Please call patient: Sarah Nicholson sent me a list of therapists which might be of help, I sent the list w/ contact info in [place] message so she should check this. Thanks!

## 2017-09-05 ENCOUNTER — Encounter: Payer: Self-pay | Admitting: Osteopathic Medicine

## 2017-09-05 ENCOUNTER — Ambulatory Visit (INDEPENDENT_AMBULATORY_CARE_PROVIDER_SITE_OTHER): Payer: 59 | Admitting: Osteopathic Medicine

## 2017-09-05 VITALS — BP 136/74 | HR 68 | Temp 97.9°F | Wt 197.8 lb

## 2017-09-05 DIAGNOSIS — F988 Other specified behavioral and emotional disorders with onset usually occurring in childhood and adolescence: Secondary | ICD-10-CM | POA: Diagnosis not present

## 2017-09-05 DIAGNOSIS — F418 Other specified anxiety disorders: Secondary | ICD-10-CM | POA: Diagnosis not present

## 2017-09-05 MED ORDER — AMPHETAMINE-DEXTROAMPHETAMINE 10 MG PO TABS: 10.0000 mg | ORAL_TABLET | Freq: Two times a day (BID) | ORAL | 0 refills | Status: DC

## 2017-09-05 NOTE — Progress Notes (Signed)
HPI: Sarah Nicholson is a 47 y.o. female who  has a past medical history of ADD (attention deficit disorder) (06/14/2012), Depression (06/14/2012), History of leukocytosis (06/14/2012), Hyperlipidemia (06/14/2012), Prediabetes (06/14/2012), and Seasonal allergies (06/14/2012).  she presents to [facility] today, 09/05/17,  for chief complaint of:  Anxiety   Severe stress and anxiety past several months. Had previously been following with LCSW downstairs at behavioral health last seen 07/2016 - had been working on anxiety issues, social anxiety. Was doing a lot better but situational factors with her husband prompted her to seek treatment for medications as needed anxiety - initial visit w/ me for this recurrence was 03/2017. 03/2017 We started clonazepam for as-needed use, situation factors made patient resistant to starting new daily augmentation medications such as abilify or wellbutrin. Had been on Lexapro since 2014.  At last visit 08/08/17: separated from husband, under a lot of stress due to this She has a good support system with her mom and a few old coworkers who are good friends. We refilled Clonazepam and pt was provided list of counselors.   Today 09/05/17: doing really well on current meds, still has clonazepam left over. Contacted one of the counselors on the list provided who takes her insurance, appt in a few weeks.      Depression screen [facility] 2/9 09/05/2017 08/08/2017 05/04/2017  Decreased Interest 1 3 1   Down, Depressed, Hopeless 1 3 1   PHQ - 2 Score 2 6 2   Altered sleeping 1 2 0  Tired, decreased energy 1 2 1   Change in appetite 1 2 1   Feeling bad or failure about yourself  1 2 1   Trouble concentrating 1 3 0  Moving slowly or fidgety/restless 1 1 0  Suicidal thoughts 1 2 0  PHQ-9 Score 9 20 5   Difficult doing work/chores Not difficult at all Very difficult Not difficult at all  Some encounter information is confidential and restricted. Go to  Review Flowsheets activity to see all data.   GAD 7 : Generalized Anxiety Score 09/05/2017 08/08/2017  Nervous, Anxious, on Edge 1 3  Control/stop worrying 2 3  Worry too much - different things 1 3  Trouble relaxing 1 3  Restless 1 2  Easily annoyed or irritable 1 1  Afraid - awful might happen 1 1  Total GAD 7 Score 8 16  Anxiety Difficulty Not difficult at all Very difficult  Some encounter information is confidential and restricted. Go to Review Flowsheets activity to see all data.       Past medical, surgical, social and family history reviewed.  Current medication list and allergy/intolerance information reviewed:    Current Outpatient Medications  Medication Sig Dispense Refill  . amphetamine-dextroamphetamine (ADDERALL) 10 MG tablet Take 1 tablet (10 mg total) by mouth 2 (two) times daily. 60 tablet 0  . clonazePAM (KLONOPIN) 0.5 MG tablet Take 1 tablet (0.5 mg total) by mouth 2 (two) times daily as needed for anxiety. 15 tablet 1  . escitalopram (LEXAPRO) 20 MG tablet TAKE 1 TABLET(20 MG) BY MOUTH DAILY 90 tablet 0  . tamsulosin (FLOMAX) 0.4 MG CAPS capsule Take 1 capsule (0.4 mg total) by mouth daily. (Patient not taking: Reported on 09/05/2017) 14 capsule 0   No current facility-administered medications for this visit.     Allergies  Allergen Reactions  . Nitrofurantoin Hives  . Other Hives    Preservative used in vaccinations  . Tetanus Toxoids Swelling    Pt states she was  told the Tdap injection would have to be administered in the hospital  After discussion on 11/10 pt remembers being told it was thimersol in the tetanus 30+years ago that caused the swelling.    Santiago Bur [Nitrofurantoin Macrocrystal] Hives      Review of Systems:  Constitutional:  No  fever, no chills, No recent illness  HEENT: No  headache  Cardiac: No  chest pain  Neurologic: No  weakness, No  dizziness,   Psychiatric: +concerns with depression, + concerns with anxiety, No sleep  problems, No mood problems  Exam:  BP 136/74 (BP Location: Left Arm, Patient Position: Sitting, Cuff Size: Normal)   Pulse 68   Temp 97.9 F (36.6 C) (Oral)   Wt 197 lb 12.8 oz (89.7 kg)   BMI 31.93 kg/m   Constitutional: VS see above. General Appearance: alert, well-developed, well-nourished, NAD  Respiratory: Normal respiratory effort.  Cardiovascular: S1/S2 normal, no murmur, no rub/gallop auscultated. RRR.  Musculoskeletal: Gait normal.  Neurological: Normal balance/coordination. No tremor   Skin: warm, dry, intact. No rash/ulcer, R shoulder some rough skin papule c/w actinic damage, L lower leg small subq cyst.   Psychiatric: Normal judgment/insight. Normal mood and affect. Oriented x3.     ASSESSMENT/PLAN: The primary encounter diagnosis was Situational anxiety. A diagnosis of Attention deficit disorder, unspecified hyperactivity presence was also pertinent to this visit.  Continue current meds Call when needing a refill on Clonazepam  OK to refill Adderall x6 months (until 02/2018)     Visit summary with medication list and pertinent instructions was printed for patient to review. All questions at time of visit were answered - patient instructed to contact office with any additional concerns. ER/RTC precautions were reviewed with the patient.   Follow-up plan: Return in about 6 months (around 03/07/2018) for maintain ADHD prescriptions - annual physical, see me sooner if needed! .     Please note: voice recognition software was used to produce this document, and typos may escape review. Please contact Dr. Sheppard Coil for any needed clarifications.

## 2017-10-21 ENCOUNTER — Other Ambulatory Visit: Payer: Self-pay | Admitting: Osteopathic Medicine

## 2017-10-21 DIAGNOSIS — F411 Generalized anxiety disorder: Secondary | ICD-10-CM

## 2017-10-21 DIAGNOSIS — F32A Depression, unspecified: Secondary | ICD-10-CM

## 2017-10-21 DIAGNOSIS — F329 Major depressive disorder, single episode, unspecified: Secondary | ICD-10-CM

## 2017-10-23 ENCOUNTER — Ambulatory Visit (INDEPENDENT_AMBULATORY_CARE_PROVIDER_SITE_OTHER): Payer: 59 | Admitting: Osteopathic Medicine

## 2017-10-23 ENCOUNTER — Encounter: Payer: Self-pay | Admitting: Osteopathic Medicine

## 2017-10-23 VITALS — BP 138/82 | HR 65 | Wt 211.0 lb

## 2017-10-23 DIAGNOSIS — R635 Abnormal weight gain: Secondary | ICD-10-CM

## 2017-10-23 DIAGNOSIS — R5383 Other fatigue: Secondary | ICD-10-CM | POA: Diagnosis not present

## 2017-10-23 NOTE — Progress Notes (Signed)
HPI: Sarah Nicholson is a 47 y.o. female who  has a past medical history of ADD (attention deficit disorder) (06/14/2012), Depression (06/14/2012), History of leukocytosis (06/14/2012), Hyperlipidemia (06/14/2012), Prediabetes (06/14/2012), and Seasonal allergies (06/14/2012).  she presents to [facility] today, 10/23/17,  for chief complaint of:  Fatigue and weight gain  Fatigue for about a week. 14 lb weight gain over past 6 weeks or so despite modifying diet: eating less, has been avoiding fast food. Working different location, less physical activity and longer hours throughout the day. ROS as below.    Past medical history, surgical history, and family history reviewed.  Current medication list and allergy/intolerance information reviewed.   (See remainder of HPI, ROS, Phys Exam below)  BP 138/82   Pulse 65   Wt 211 lb (95.7 kg)   BMI 34.06 kg/m  Previously 197 lbs 09/05/2017 185 lbs 12/2015  TSH 10/2016 WNL     ASSESSMENT/PLAN: The primary encounter diagnosis was Fatigue, unspecified type. A diagnosis of Weight gain was also pertinent to this visit.   Discussed r/o secondary cause weight gain and will also initiate stricter lifestyle modifications to optimize diet/exercise.    Orders Placed This Encounter  Procedures  . CBC with Differential/Platelet  . COMPLETE METABOLIC PANEL WITH GFR  . TSH  . T4, free  . Hemoglobin A1c  . Urinalysis, Routine w reflex microscopic  . High sensitivity CRP  . Sedimentation rate       Patient Instructions  Things to remember for exercise for weight loss:   Please note - I am not a certified personal trainer. I can present you with ideas and general workout goals, but an exercise program is largely up to you. Find something you can stick with, and something you enjoy!   As you progress in your exercise regimen think about gradually increasing the following, week by week:   intensity (how strenuous is  your workout)  frequency (how often you are exercising)  duration (how many minutes at a time you are exercising)  Walking for 20 minutes a day is certainly better than nothing, but more strenuous exercise will develop better cardiovascular fitness.   interval training (high-intensity alternating with low-intensity, think walk/jog rather than just walk)  muscle strengthening exercises (weight lifting, calisthenics, yoga) - this also helps prevent osteoporosis!   Things to remember for diet changes for weight loss:   Please note - I am not a certified dietician. I can present you with ideas and general diet goals, but a meal plan is largely up to you. I am happy to refer you to a dietician who can give you a detailed meal plan.  Apps/logs are crucial to track how you're eating! It's not realistic to be logging everything you eat forever, but when you're starting a healthy eating lifestyle it's very helpful, and checking in with logs now and then helps you stick to your program!   Calorie restriction with the goal weight loss of no more than one to one and a half pounds per week.   Increase lean protein such as chicken, fish, [place].   Decrease fatty foods such as dairy, butter.   Decrease sugary foods. Avoid sugary drinks such as soda or juice.  Increase fiber found in fruit and vegetables.      Follow-up plan: Return for recheck based on labs, will monitor weight in 2 weeks or so (sooner if needed) .                    ############################################ ############################################ ############################################ ############################################  Outpatient Encounter Medications as of 10/23/2017  Medication Sig  . amphetamine-dextroamphetamine (ADDERALL) 10 MG tablet Take 1 tablet (10 mg total) by mouth 2 (two) times daily.  . clonazePAM (KLONOPIN) 0.5 MG tablet Take 1 tablet (0.5 mg total) by mouth 2  (two) times daily as needed for anxiety.  [name] escitalopram (LEXAPRO) 20 MG tablet TAKE 1 TABLET(20 MG) BY MOUTH DAILY   No facility-administered encounter medications on file as of 10/23/2017.    Allergies  Allergen Reactions  . Nitrofurantoin Hives  . Other Hives    Preservative used in vaccinations  . Tetanus Toxoids Swelling    Pt states she was told the Tdap injection would have to be administered in the hospital  After discussion on 11/10 pt remembers being told it was thimersol in the tetanus 30+years ago that caused the swelling.    Santiago Bur [Nitrofurantoin Macrocrystal] Hives      Review of Systems:  Constitutional: No recent illness, +spider bite few weeks ago w/o complications   HEENT: No  headache, no vision change  Cardiac: No  chest pain, No  pressure, No palpitations  Respiratory:  No  shortness of breath. No  Cough  Gastrointestinal: No  abdominal pain, no change on bowel habits  Musculoskeletal: No new myalgia/arthralgia  Skin: No  Rash  Hem/Onc: No  easy bruising/bleeding, No  abnormal lumps/bumps  Neurologic: No  weakness, No  Dizziness  Psychiatric: No  concerns with depression, No  concerns with anxiety  Exam:  BP 138/82   Pulse 65   Wt 211 lb (95.7 kg)   BMI 34.06 kg/m   Constitutional: VS see above. General Appearance: alert, well-developed, well-nourished, NAD  Eyes: Normal lids and conjunctive, non-icteric sclera  Ears, Nose, Mouth, Throat: MMM, Normal external inspection ears/nares/mouth/lips/gums.  Neck: No masses, trachea midline.   Respiratory: Normal respiratory effort. no wheeze, no rhonchi, no rales  Cardiovascular: S1/S2 normal, no murmur, no rub/gallop auscultated. RRR.   Musculoskeletal: Gait normal. Symmetric and independent movement of all extremities  Gastro: Nontender, no organ enlargement  Neurological: Normal balance/coordination. No tremor.  Skin: warm, dry, intact.   Psychiatric: Normal judgment/insight.  Normal mood and affect. Oriented x3.   Visit summary with medication list and pertinent instructions was printed for patient to review, advised to alert [place] if any changes needed. All questions at time of visit were answered - patient instructed to contact office with any additional concerns. ER/RTC precautions were reviewed with the patient and understanding verbalized.   Follow-up plan: Return for recheck based on labs, will monitor weight in 2 weeks or so (sooner if needed) .    Please note: voice recognition software was used to produce this document, and typos may escape review. Please contact Dr. Sheppard Coil for any needed clarifications.

## 2017-10-23 NOTE — Patient Instructions (Signed)
Things to remember for exercise for weight loss:   Please note - I am not a certified personal trainer. I can present you with ideas and general workout goals, but an exercise program is largely up to you. Find something you can stick with, and something you enjoy!   As you progress in your exercise regimen think about gradually increasing the following, week by week:   intensity (how strenuous is your workout)  frequency (how often you are exercising)  duration (how many minutes at a time you are exercising)  Walking for 20 minutes a day is certainly better than nothing, but more strenuous exercise will develop better cardiovascular fitness.   interval training (high-intensity alternating with low-intensity, think walk/jog rather than just walk)  muscle strengthening exercises (weight lifting, calisthenics, yoga) - this also helps prevent osteoporosis!   Things to remember for diet changes for weight loss:   Please note - I am not a certified dietician. I can present you with ideas and general diet goals, but a meal plan is largely up to you. I am happy to refer you to a dietician who can give you a detailed meal plan.  Apps/logs are crucial to track how you're eating! It's not realistic to be logging everything you eat forever, but when you're starting a healthy eating lifestyle it's very helpful, and checking in with logs now and then helps you stick to your program!   Calorie restriction with the goal weight loss of no more than one to one and a half pounds per week.   Increase lean protein such as chicken, fish, [place].   Decrease fatty foods such as dairy, butter.   Decrease sugary foods. Avoid sugary drinks such as soda or juice.  Increase fiber found in fruit and vegetables.

## 2017-10-24 LAB — COMPLETE METABOLIC PANEL WITH GFR
AG Ratio: 1.5 (calc) (ref 1.0–2.5)
ALBUMIN MSPROF: 4 g/dL (ref 3.6–5.1)
ALKALINE PHOSPHATASE (APISO): 71 U/L (ref 33–115)
ALT: 14 U/L (ref 6–29)
AST: 12 U/L (ref 10–35)
BUN: 12 mg/dL (ref 7–25)
CALCIUM: 8.8 mg/dL (ref 8.6–10.2)
CO2: 25 mmol/L (ref 20–32)
CREATININE: 0.72 mg/dL (ref 0.50–1.10)
Chloride: 106 mmol/L (ref 98–110)
GFR, EST NON AFRICAN AMERICAN: 99 mL/min/{1.73_m2} (ref >=60)
GFR, Est African American: 115 mL/min/{1.73_m2} (ref >=60)
GLOBULIN: 2.6 g/dL (ref 1.9–3.7)
GLUCOSE: 78 mg/dL (ref 65–99)
Potassium: 4.3 mmol/L (ref 3.5–5.3)
Sodium: 138 mmol/L (ref 135–146)
Total Bilirubin: 0.3 mg/dL (ref 0.2–1.2)
Total Protein: 6.6 g/dL (ref 6.1–8.1)

## 2017-10-24 LAB — SEDIMENTATION RATE: SED RATE: 6 mm/h (ref 0–20)

## 2017-10-24 LAB — CBC WITH DIFFERENTIAL/PLATELET
Basophils Absolute: 68 cells/uL (ref 0–200)
Basophils Relative: 0.6 %
Eosinophils Absolute: 80 cells/uL (ref 15–500)
Eosinophils Relative: 0.7 %
HEMATOCRIT: 43.8 % (ref 35.0–45.0)
HEMOGLOBIN: 14.9 g/dL (ref 11.7–15.5)
LYMPHS ABS: 3124 {cells}/uL (ref 850–3900)
MCH: 29.9 pg (ref 27.0–33.0)
MCHC: 34 g/dL (ref 32.0–36.0)
MCV: 88 fL (ref 80.0–100.0)
MONOS PCT: 6.4 %
MPV: 9.5 fL (ref 7.5–12.5)
NEUTROS ABS: 7399 {cells}/uL (ref 1500–7800)
Neutrophils Relative %: 64.9 %
Platelets: 341 10*3/uL (ref 140–400)
RBC: 4.98 10*6/uL (ref 3.80–5.10)
RDW: 12.8 % (ref 11.0–15.0)
Total Lymphocyte: 27.4 %
WBC mixed population: 730 cells/uL (ref 200–950)
WBC: 11.4 10*3/uL — AB (ref 3.8–10.8)

## 2017-10-24 LAB — URINALYSIS, ROUTINE W REFLEX MICROSCOPIC
BACTERIA UA: NONE SEEN /HPF
Bilirubin Urine: NEGATIVE
GLUCOSE, UA: NEGATIVE
HYALINE CAST: NONE SEEN /LPF
Ketones, ur: NEGATIVE
Leukocytes, UA: NEGATIVE
Nitrite: NEGATIVE
Protein, ur: NEGATIVE
SPECIFIC GRAVITY, URINE: 1.007 (ref 1.001–1.03)
pH: 6 (ref 5.0–8.0)

## 2017-10-24 LAB — HEMOGLOBIN A1C
EAG (MMOL/L): 6.5 (calc)
Hgb A1c MFr Bld: 5.7 % of total Hgb — ABNORMAL HIGH (ref <5.7)
MEAN PLASMA GLUCOSE: 117 (calc)

## 2017-10-24 LAB — HIGH SENSITIVITY CRP: hs-CRP: 6.5 mg/L — ABNORMAL HIGH

## 2017-10-24 LAB — T4, FREE: FREE T4: 1 ng/dL (ref 0.8–1.8)

## 2017-10-24 LAB — TSH: TSH: 2.31 m[IU]/L

## 2017-11-06 ENCOUNTER — Ambulatory Visit (INDEPENDENT_AMBULATORY_CARE_PROVIDER_SITE_OTHER): Payer: 59 | Admitting: Osteopathic Medicine

## 2017-11-06 ENCOUNTER — Encounter: Payer: Self-pay | Admitting: Osteopathic Medicine

## 2017-11-06 VITALS — BP 123/70 | HR 81 | Temp 98.2°F | Wt 209.7 lb

## 2017-11-06 DIAGNOSIS — R5383 Other fatigue: Secondary | ICD-10-CM | POA: Diagnosis not present

## 2017-11-06 NOTE — Progress Notes (Signed)
HPI: Sarah Nicholson is a 47 y.o. female who  has a past medical history of ADD (attention deficit disorder) (06/14/2012), Depression (06/14/2012), History of leukocytosis (06/14/2012), Hyperlipidemia (06/14/2012), Prediabetes (06/14/2012), and Seasonal allergies (06/14/2012).  she presents to [facility] today, 11/06/17,  for chief complaint of:  Fatigue follow-up Weight recheck   Down a few pounds since last visit. Counting calories. Fatigue is a little bit better. Job still sedentary and long hours. Labs reviewed with the patient, all questions answered.   Wt Readings from Last 3 Encounters:  11/06/17 209 lb 11.2 oz (95.1 kg)  10/23/17 211 lb (95.7 kg)  09/05/17 197 lb 12.8 oz (89.7 kg)    Past medical history, surgical history, and family history reviewed.  Current medication list and allergy/intolerance information reviewed.   (See remainder of HPI, ROS, Phys Exam below)    ASSESSMENT/PLAN:   Fatigue, unspecified type   Seems to be resolving w/ diet, encouraged more exercise, I don't think the borderline elevated CRP and WBC are relevant, but would follow these labs in a few months. She has f/u in 02/2018 will see then!    Follow-up plan: keep currently scheduled visit                     ############################################ ############################################ ############################################ ############################################    Outpatient Encounter Medications as of 11/06/2017  Medication Sig  . clonazePAM (KLONOPIN) 0.5 MG tablet Take 1 tablet (0.5 mg total) by mouth 2 (two) times daily as needed for anxiety.  [name] escitalopram (LEXAPRO) 20 MG tablet TAKE 1 TABLET(20 MG) BY MOUTH DAILY  . amphetamine-dextroamphetamine (ADDERALL) 10 MG tablet Take 1 tablet (10 mg total) by mouth 2 (two) times daily.   No facility-administered encounter medications on file as of 11/06/2017.    Allergies   Allergen Reactions  . Nitrofurantoin Hives  . Other Hives    Preservative used in vaccinations  . Tetanus Toxoids Swelling    Pt states she was told the Tdap injection would have to be administered in the hospital  After discussion on 11/10 pt remembers being told it was thimersol in the tetanus 30+years ago that caused the swelling.    Santiago Bur [Nitrofurantoin Macrocrystal] Hives      Review of Systems:  Constitutional: No recent illness  Respiratory:  No  shortness of breath  Neurologic: No  weakness, No  Dizziness  Exam:  BP 123/70 (BP Location: Left Arm, Patient Position: Sitting, Cuff Size: Normal)   Pulse 81   Temp 98.2 F (36.8 C) (Oral)   Wt 209 lb 11.2 oz (95.1 kg)   SpO2 95%   BMI 33.85 kg/m   Constitutional: VS see above. General Appearance: alert, well-developed, well-nourished, NAD  Eyes: Normal lids and conjunctive, non-icteric sclera  Ears, Nose, Mouth, Throat: MMM, Normal external inspection ears/nares/mouth/lips/gums.  Neck: No masses, trachea midline.   Respiratory: Normal respiratory effort.   Neurological: Normal balance/coordination. No tremor.  Skin: warm, dry, intact.   Psychiatric: Normal judgment/insight. Normal mood and affect. Oriented x3.   Visit summary with medication list and pertinent instructions was printed for patient to review, advised to alert [place] if any changes needed. All questions at time of visit were answered - patient instructed to contact office with any additional concerns. ER/RTC precautions were reviewed with the patient and understanding verbalized.    Note: Total time spent 15 minutes, greater than 50% of the visit was spent face-to-face counseling and coordinating care for the following:  The encounter diagnosis was Fatigue, unspecified type..  Please note: voice recognition software was used to produce this document, and typos may escape review. Please contact Dr. Sheppard Coil for any needed clarifications.

## 2017-12-25 ENCOUNTER — Emergency Department (INDEPENDENT_AMBULATORY_CARE_PROVIDER_SITE_OTHER)
Admission: EM | Admit: 2017-12-25 | Discharge: 2017-12-25 | Disposition: A | Discharge status: Home / Self Care | Payer: 59 | Source: Home / Self Care | Attending: Family Medicine | Admitting: Family Medicine

## 2017-12-25 ENCOUNTER — Encounter: Payer: Self-pay | Admitting: Emergency Medicine

## 2017-12-25 DIAGNOSIS — R509 Fever, unspecified: Secondary | ICD-10-CM

## 2017-12-25 DIAGNOSIS — I889 Nonspecific lymphadenitis, unspecified: Secondary | ICD-10-CM | POA: Diagnosis not present

## 2017-12-25 DIAGNOSIS — H9201 Otalgia, right ear: Secondary | ICD-10-CM

## 2017-12-25 LAB — POCT CBC W AUTO DIFF (K'VILLE URGENT CARE)

## 2017-12-25 MED ORDER — AMOXICILLIN 500 MG PO CAPS: 500.0000 mg | ORAL_CAPSULE | Freq: Three times a day (TID) | ORAL | 0 refills | Status: DC

## 2017-12-25 NOTE — ED Notes (Signed)
Bed: KUC5 Expected date: 12/25/17 Expected time: 12:19 PM Means of arrival:  Comments:

## 2017-12-25 NOTE — Discharge Instructions (Signed)
°  Please call today to schedule a follow up appointment for Friday in case you are not feeling better.  You may cancel your appointment if you are feeling better.  In the rare chance you do have mumps, please wear a mask when around others, try to stay at home until the test results come back.  If you have to go to the hospital, please wear a mask.  You will be notified of today's test results as soon as they come back. If symptoms worsening, please go to the emergency department for further evaluation and treatment.

## 2017-12-25 NOTE — ED Triage Notes (Signed)
Pt c/o facial pain, right ear pain and pain with swallowing x2 days. States yesterday she had a fever of 102. Denies meds.

## 2017-12-25 NOTE — ED Provider Notes (Signed)
[name]    CSN: [id number] Arrival date & time: 12/25/17  1006     History   Chief Complaint Chief Complaint  Patient presents with  . Facial Pain    HPI Sarah Nicholson is a 47 y.o. female.   HPI  Sarah Nicholson is a 47 y.o. female presenting to UC with c/o Right sided ear pain, and facial pain with pain with swallowing the last 2 days.  Yesterday she had a fever of 102*F.  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 She is concerned she may have mumps despite being vaccinated. No specific contact with someone with mumps that she knows of.  She also reports flying to [place] 09/22-10/5.  Denies n/v/d. Denies cough or congestion.    Past Medical History:  Diagnosis Date  . ADD (attention deficit disorder) 06/14/2012  . Depression 06/14/2012  . History of leukocytosis 06/14/2012   Reports "WBC 32" decades ago with negative hematology specialist workup.   [name] Hyperlipidemia 06/14/2012  . Prediabetes 06/14/2012  . Seasonal allergies 06/14/2012    Patient Active Problem List   Diagnosis Date Noted  . Sun-induced skin changes, keratosis 08/08/2017  . Subcutaneous cyst 08/08/2017  . Irritable bowel syndrome (IBS) 05/04/2017  . Generalized anxiety disorder 03/16/2016  . Social anxiety disorder 03/16/2016  . Major depressive disorder, recurrent episode, moderate ([place]) 03/16/2016  . Right inguinal hernia 07/14/2015  . Hallux valgus 12/09/2014  . Acute headache 07/31/2014  . Chest pain 07/31/2014  . Hypotension 07/31/2014  . Other and unspecified ovarian cyst 06/25/2013  . Left tennis elbow 06/25/2013  . Left breast mass 06/27/2012  . Microscopic hematuria 06/27/2012  . Hyperlipidemia 06/14/2012  . Prediabetes 06/14/2012  . History of leukocytosis 06/14/2012  . Seasonal allergies 06/14/2012  . Depression 06/14/2012  . ADD (attention deficit disorder) 06/14/2012    Past Surgical History:  Procedure Laterality Date  .  APPENDECTOMY    . bladder sling/mesh    . INGUINAL HERNIA REPAIR    . intrauterine ablation    . NASAL SINUS SURGERY    . TUBAL LIGATION      OB History   None      Home Medications    Prior to Admission medications   Medication Sig Start Date End Date Taking? Authorizing Provider  amoxicillin (AMOXIL) 500 MG capsule Take 1 capsule (500 mg total) by mouth 3 (three) times daily. 12/25/17   Noe Gens, PA-C  amphetamine-dextroamphetamine (ADDERALL) 10 MG tablet Take 1 tablet (10 mg total) by mouth 2 (two) times daily. 09/07/17 10/07/17  Emeterio Reeve, DO  clonazePAM (KLONOPIN) 0.5 MG tablet Take 1 tablet (0.5 mg total) by mouth 2 (two) times daily as needed for anxiety. 03/29/17   Emeterio Reeve, DO  escitalopram (LEXAPRO) 20 MG tablet TAKE 1 TABLET(20 MG) BY MOUTH DAILY 10/23/17   Emeterio Reeve, DO    Family History Family History  Problem Relation Age of Onset  . Depression Mother   . Diabetes Father   . Hyperlipidemia Father   . Hypertension Father   . Depression Sister   . Depression Son   . Breast cancer Maternal Grandmother     Social History Social History   Tobacco Use  . Smoking status: Current Every Day Smoker    Types: Cigarettes  . Smokeless tobacco: Never Used  Substance Use Topics  . Alcohol use: Yes    Comment: 1-2 drinks every 6 months  . Drug use:  No     Allergies   Nitrofurantoin; Other; Tetanus toxoids; and Macrobid [nitrofurantoin macrocrystal]   Review of Systems Review of Systems  Constitutional: Positive for fever. Negative for appetite change, chills and fatigue.  HENT: Positive for ear pain and sore throat. Negative for congestion, facial swelling, sinus pressure, sinus pain and trouble swallowing.   Respiratory: Negative for choking and shortness of breath.   Gastrointestinal: Negative for diarrhea, nausea and vomiting.  Musculoskeletal: Negative for neck pain and neck stiffness.  Skin: Negative for rash.    Neurological: Negative for dizziness, light-headedness and headaches.     Physical Exam Triage Vital Signs ED Triage Vitals [12/25/17 1059]  Enc Vitals Group     BP (!) 148/84     Pulse Rate 80     Resp      Temp 98.6 F (37 C)     Temp Source Oral     SpO2 98 %     Weight 200 lb (90.7 kg)     Height      Head Circumference      Peak Flow      Pain Score 5     Pain Loc      Pain Edu?      Excl. in [place]?    No data found.  Updated Vital Signs BP (!) 148/84 (BP Location: Right Arm)   Pulse 80   Temp 98.6 F (37 C) (Oral)   Wt 200 lb (90.7 kg)   SpO2 98%   BMI 32.28 kg/m   Visual Acuity Right Eye Distance:   Left Eye Distance:   Bilateral Distance:    Right Eye Near:   Left Eye Near:    Bilateral Near:     Physical Exam  Constitutional: She is oriented to person, place, and time. She appears well-developed and well-nourished. No distress.  HENT:  Head: Normocephalic and atraumatic.    Right Ear: Tympanic membrane normal.  Left Ear: Tympanic membrane normal.  Nose: Nose normal. Right sinus exhibits no maxillary sinus tenderness and no frontal sinus tenderness. Left sinus exhibits no maxillary sinus tenderness and no frontal sinus tenderness.  Mouth/Throat: Uvula is midline, oropharynx is clear and moist and mucous membranes are normal.  Eyes: EOM are normal.  Neck: Normal range of motion. Neck supple.  Cardiovascular: Normal rate.  Pulmonary/Chest: Effort normal.  Musculoskeletal: Normal range of motion.  Neurological: She is alert and oriented to person, place, and time.  Skin: Skin is warm and dry. She is not diaphoretic.  Psychiatric: She has a normal mood and affect. Her behavior is normal.  Nursing note and vitals reviewed.    UC Treatments / Results  Labs (all labs ordered are listed, but only abnormal results are displayed) Labs Reviewed  STREP A DNA PROBE  MUMPS ANTIBODY, IGG  MUMPS ANTIBODY, IGM  POCT CBC W AUTO DIFF ([place])     EKG None  Radiology No results found.  Procedures Procedures (including critical care time)  Medications Ordered in UC Medications - No data to display  Initial Impression / Assessment and Plan / UC Course  I have reviewed the triage vital signs and the nursing notes.  Pertinent labs & imaging results that were available during my care of the patient were reviewed by me and considered in my medical decision making (see chart for details).     Pt concerned for mumps as she works in a [employer] where there is a current outbreak of [employer]. Mild Right side submandibular lymphadenopathy. No significant swelling noted on exam.  Contacts [facility] as well Health Department who determined pt is low risk for mumps. Did not recommend PCR testing at this time.  Labs ordered: CBC- unremarkable, Strep Culture- pending, Mumps titers- pending. Will cover for potential infectious parotiditis  Work note provided along with info packet and home care instructions below.  Final Clinical Impressions(s) / UC Diagnoses   Final diagnoses:  Acute otalgia, right  Fever in adult  Submandibular lymphadenitis     Discharge Instructions      Please call today to schedule a follow up appointment for Friday in case you are not feeling better.  You may cancel your appointment if you are feeling better.  In the rare chance you do have mumps, please wear a mask when around others, try to stay at home until the test results come back.  If you have to go to the hospital, please wear a mask.  You will be notified of today's test results as soon as they come back. If symptoms worsening, please go to the emergency department for further evaluation and treatment.     ED Prescriptions    Medication Sig Dispense Auth. Provider   amoxicillin (AMOXIL) 500 MG capsule Take 1 capsule (500 mg total) by mouth 3 (three) times daily. 21 capsule Noe Gens, PA-C     Controlled Substance Prescriptions [place] Controlled Substance Registry consulted? Not Applicable   Tyrell Antonio 12/25/17 1317

## 2017-12-26 LAB — STREP A DNA PROBE: Group A Strep Probe: NOT DETECTED

## 2017-12-27 ENCOUNTER — Telehealth: Payer: Self-pay | Admitting: Emergency Medicine

## 2017-12-27 ENCOUNTER — Encounter: Payer: Self-pay | Admitting: Osteopathic Medicine

## 2017-12-27 NOTE — Telephone Encounter (Signed)
Patient states no improvement; gave her lab results for Mumps IgG; IgM not completed. Suggested she make appt.with  Family Medicine to follow up and she will do this.

## 2017-12-28 LAB — MUMPS ANTIBODY, IGM: Mumps IgM Value: <1:20 {titer}

## 2017-12-28 LAB — MUMPS ANTIBODY, IGG: Mumps IgG: 135 AU/mL

## 2017-12-28 NOTE — Telephone Encounter (Signed)
Advised pt of Mumps IGG

## 2018-01-01 ENCOUNTER — Telehealth: Payer: Self-pay | Admitting: Osteopathic Medicine

## 2018-01-01 NOTE — Telephone Encounter (Signed)
Pt has been updated. Pt requested to follow up with provider, transferred to scheduling desk to make an appt.

## 2018-01-01 NOTE — Telephone Encounter (Signed)
Please call patient, urgent care may have called her already, testing for acute mumps was negative

## 2018-01-04 ENCOUNTER — Ambulatory Visit (INDEPENDENT_AMBULATORY_CARE_PROVIDER_SITE_OTHER): Payer: 59 | Admitting: Osteopathic Medicine

## 2018-01-04 ENCOUNTER — Encounter: Payer: Self-pay | Admitting: Osteopathic Medicine

## 2018-01-04 VITALS — BP 134/78 | HR 57 | Temp 98.0°F | Wt 207.8 lb

## 2018-01-04 DIAGNOSIS — F988 Other specified behavioral and emotional disorders with onset usually occurring in childhood and adolescence: Secondary | ICD-10-CM

## 2018-01-04 DIAGNOSIS — J069 Acute upper respiratory infection, unspecified: Secondary | ICD-10-CM

## 2018-01-04 DIAGNOSIS — F418 Other specified anxiety disorders: Secondary | ICD-10-CM

## 2018-01-04 MED ORDER — AMPHETAMINE-DEXTROAMPHETAMINE 10 MG PO TABS: 10.0000 mg | ORAL_TABLET | Freq: Two times a day (BID) | ORAL | 0 refills | Status: DC

## 2018-01-04 MED ORDER — CLONAZEPAM 0.5 MG PO TABS: 0.5000 mg | ORAL_TABLET | Freq: Two times a day (BID) | ORAL | 1 refills | Status: DC | PRN

## 2018-01-04 NOTE — Progress Notes (Signed)
HPI: Sarah Nicholson is a 47 y.o. female who  has a past medical history of ADD (attention deficit disorder) (06/14/2012), Depression (06/14/2012), History of leukocytosis (06/14/2012), Hyperlipidemia (06/14/2012), Prediabetes (06/14/2012), and Seasonal allergies (06/14/2012).  she presents to [facility] today, 01/04/18,  for chief complaint of:  Follow-up from urgent care  Urgent care records reviewed, seen 12/25/2017, 10 days ago, for facial pain and pain with swallowing for 2 days.  Fever up to 102.  Mumps exposure but IgG was negative.  Physical exam did not show any tenderness around parotids, she had some mild submandibular lymphadenitis on the right.  Next season health department did not recommend PCR testing.  Strep was negative. Feeling well now, really just here for note stating ok to go back to work.   Requests refill of clonazepam, some significant family events have happened and she is having a tough time coping.     Past medical history, surgical history, and family history reviewed.  Current medication list and allergy/intolerance information reviewed.   (See remainder of HPI, ROS, Phys Exam below)      ASSESSMENT/PLAN: Note written to go back to work, see chart  Viral upper respiratory illness  Attention deficit disorder, unspecified hyperactivity presence - Plan: amphetamine-dextroamphetamine (ADDERALL) 10 MG tablet  Situational anxiety   Meds ordered this encounter  Medications  . clonazePAM (KLONOPIN) 0.5 MG tablet    Sig: Take 1 tablet (0.5 mg total) by mouth 2 (two) times daily as needed for anxiety.    Dispense:  15 tablet    Refill:  1  . amphetamine-dextroamphetamine (ADDERALL) 10 MG tablet    Sig: Take 1 tablet (10 mg total) by mouth 2 (two) times daily.    Dispense:  60 tablet    Refill:  0    Follow-up plan: Return for recheck when need refill of AD meds, sooner if needed  .                  ############################################ ############################################ ############################################ ############################################    Outpatient Encounter Medications as of 01/04/2018  Medication Sig  . amoxicillin (AMOXIL) 500 MG capsule Take 1 capsule (500 mg total) by mouth 3 (three) times daily.  [name] amphetamine-dextroamphetamine (ADDERALL) 10 MG tablet Take 1 tablet (10 mg total) by mouth 2 (two) times daily.  . clonazePAM (KLONOPIN) 0.5 MG tablet Take 1 tablet (0.5 mg total) by mouth 2 (two) times daily as needed for anxiety.  [name] escitalopram (LEXAPRO) 20 MG tablet TAKE 1 TABLET(20 MG) BY MOUTH DAILY   No facility-administered encounter medications on file as of 01/04/2018.    Allergies  Allergen Reactions  . Nitrofurantoin Hives  . Other Hives    Preservative used in vaccinations  . Tetanus Toxoids Swelling    Pt states she was told the Tdap injection would have to be administered in the hospital  After discussion on 11/10 pt remembers being told it was thimersol in the tetanus 30+years ago that caused the swelling.    Santiago Bur [Nitrofurantoin Macrocrystal] Hives      Review of Systems:  Constitutional: +recent illness, improved  HEENT: No  headache, no vision change  Cardiac: No  chest pain, No  pressure, No palpitations  Respiratory:  No  shortness of breath. No  Cough  Gastrointestinal: No  abdominal pain, no change on bowel habits  Musculoskeletal: No new myalgia/arthralgia  Skin: No  Rash  Hem/Onc: No  easy bruising/bleeding, No  abnormal lumps/bumps  Neurologic: No  weakness, No  Dizziness  Psychiatric: +concerns with depression, +concerns with anxiety  Exam:  BP 134/78 (BP Location: Left Arm, Patient Position: Sitting, Cuff Size: Large)   Pulse (!) 57   Temp 98 F (36.7 C) (Oral)   Wt 207 lb 12.8 oz (94.3 kg)   BMI 33.54 kg/m   Constitutional: VS see above.  General Appearance: alert, well-developed, well-nourished, NAD  Eyes: Normal lids and conjunctive, non-icteric sclera  Ears, Nose, Mouth, Throat: MMM, Normal external inspection ears/nares/mouth/lips/gums.  TM normal bilaterally, normal pharynx and nasal mucosa.  Neck: No masses, trachea midline.   Respiratory: Normal respiratory effort. no wheeze, no rhonchi, no rales  Cardiovascular: S1/S2 normal, no murmur, no rub/gallop auscultated. RRR.   Musculoskeletal: Gait normal. Symmetric and independent movement of all extremities  Neurological: Normal balance/coordination. No tremor.  Skin: warm, dry, intact.   Psychiatric: Normal judgment/insight. Normal mood and affect. Oriented x3.   Visit summary with medication list and pertinent instructions was printed for patient to review, advised to alert [place] if any changes needed. All questions at time of visit were answered - patient instructed to contact office with any additional concerns. ER/RTC precautions were reviewed with the patient and understanding verbalized.   Follow-up plan: Return for recheck when need refill of AD meds, sooner if needed .  Note: Total time spent 15 minutes, greater than 50% of the visit was spent face-to-face counseling and coordinating care for the following: The primary encounter diagnosis was Viral upper respiratory illness. Diagnoses of Attention deficit disorder, unspecified hyperactivity presence and Situational anxiety were also pertinent to this visit.[name]  Please note: voice recognition software was used to produce this document, and typos may escape review. Please contact Dr. Sheppard Coil for any needed clarifications.

## 2018-03-05 ENCOUNTER — Other Ambulatory Visit (HOSPITAL_COMMUNITY)
Admission: RE | Admit: 2018-03-05 | Discharge: 2018-03-05 | Disposition: A | Discharge status: Home / Self Care | Payer: 59 | Source: Ambulatory Visit | Attending: Osteopathic Medicine | Admitting: Osteopathic Medicine

## 2018-03-05 ENCOUNTER — Encounter: Payer: Self-pay | Admitting: Osteopathic Medicine

## 2018-03-05 ENCOUNTER — Ambulatory Visit (INDEPENDENT_AMBULATORY_CARE_PROVIDER_SITE_OTHER): Payer: 59 | Admitting: Osteopathic Medicine

## 2018-03-05 VITALS — BP 125/81 | HR 80 | Temp 98.1°F | Wt 205.9 lb

## 2018-03-05 DIAGNOSIS — Z124 Encounter for screening for malignant neoplasm of cervix: Secondary | ICD-10-CM | POA: Diagnosis present

## 2018-03-05 DIAGNOSIS — Z1239 Encounter for other screening for malignant neoplasm of breast: Secondary | ICD-10-CM

## 2018-03-05 DIAGNOSIS — Z0000 Encounter for general adult medical examination without abnormal findings: Secondary | ICD-10-CM | POA: Diagnosis not present

## 2018-03-05 DIAGNOSIS — F988 Other specified behavioral and emotional disorders with onset usually occurring in childhood and adolescence: Secondary | ICD-10-CM

## 2018-03-05 DIAGNOSIS — E782 Mixed hyperlipidemia: Secondary | ICD-10-CM | POA: Diagnosis not present

## 2018-03-05 DIAGNOSIS — Z716 Tobacco abuse counseling: Secondary | ICD-10-CM

## 2018-03-05 DIAGNOSIS — R7303 Prediabetes: Secondary | ICD-10-CM | POA: Diagnosis not present

## 2018-03-05 NOTE — Patient Instructions (Addendum)
General Preventive Care  Most recent routine screening lipids/other labs: ordered today to follow up on sugars and cholesterol.   Tobacco: don't! Please let me know if you need help quitting!   Alcohol: responsible moderation is ok for most adults - if you have concerns about your alcohol intake, please talk to me!   Exercise: as tolerated to reduce risk of cardiovascular disease and diabetes. Strength training will also prevent osteoporosis.   Mental health: if need for mental health care (medicines, counseling, other), please let me know!   Sexual health: if need for STD testing, or if concerns with libido/pain problems, please let me know!   Advanced Directive: Living Will and/or Healthcare Power of Attorney recommended for all adults, regardless of age or health.  Vaccines  Flu vaccine: recommended for almost everyone, every fall.   Shingles vaccine: Shingrix recommended after age 71.   Pneumonia vaccines: Prevnar and Pneumovax recommended after age 67, or sooner if certain medical conditions. Will get records   Tetanus booster: last done 01/2013, Tdap recommended every 10 years. Cancer screenings   Colon cancer screening: recommended for everyone at age 36, but some folks need a colonoscopy sooner if risk factors   Breast cancer screening: mammogram recommended at age 24 every other year at least, and annually after age 14. Will look into genetic testing.   Cervical cancer screening: Pap every 1 to 5 years depending on age and other risk factors. Can usually stop at age 24 or w/ hysterectomy.   Lung cancer screening: CT chest every year for those sge 22 to 49 years old with ?30 pack year smoking history, who either currently smoke or have quit within the past 15 years.  Infection screenings . HIV, Gonorrhea/Chlamydia: screening as needed . Hepatitis C: recommended for anyone born 15-1967 - not needed . TB: certain at-risk populations, or depending on work requirements  and/or travel history Other  Bone Density Test: recommended for women at age 79

## 2018-03-05 NOTE — Progress Notes (Signed)
HPI: Sarah Nicholson is a 47 y.o. female who  has a past medical history of ADD (attention deficit disorder) (06/14/2012), Depression (06/14/2012), History of leukocytosis (06/14/2012), Hyperlipidemia (06/14/2012), Prediabetes (06/14/2012), and Seasonal allergies (06/14/2012).  she presents to [facility] today, 03/05/18,  for chief complaint of: Annual Physical  ADD medication refill    Patient here for annual physical / wellness exam.  See preventive care reviewed as below.  Recent labs reviewed from 10/2017  Additional concerns today include:   ADD med refills needed - PDMP reviewed, last filled Adderall 10 mg #60 x30 days 01/04/18. Taking sporadically as needed.     Past medical, surgical, social and family history reviewed:  Patient Active Problem List   Diagnosis Date Noted  . Sun-induced skin changes, keratosis 08/08/2017  . Subcutaneous cyst 08/08/2017  . Irritable bowel syndrome (IBS) 05/04/2017  . Generalized anxiety disorder 03/16/2016  . Social anxiety disorder 03/16/2016  . Major depressive disorder, recurrent episode, moderate ([place]) 03/16/2016  . Right inguinal hernia 07/14/2015  . Hallux valgus 12/09/2014  . Acute headache 07/31/2014  . Chest pain 07/31/2014  . Hypotension 07/31/2014  . Other and unspecified ovarian cyst 06/25/2013  . Left tennis elbow 06/25/2013  . Left breast mass 06/27/2012  . Microscopic hematuria 06/27/2012  . Hyperlipidemia 06/14/2012  . Prediabetes 06/14/2012  . History of leukocytosis 06/14/2012  . Seasonal allergies 06/14/2012  . Depression 06/14/2012  . ADD (attention deficit disorder) 06/14/2012    Past Surgical History:  Procedure Laterality Date  . APPENDECTOMY    . bladder sling/mesh    . INGUINAL HERNIA REPAIR    . intrauterine ablation    . NASAL SINUS SURGERY    . TUBAL LIGATION      Social History   Tobacco Use  . Smoking status: Current Every Day Smoker    Types: Cigarettes  .  Smokeless tobacco: Never Used  Substance Use Topics  . Alcohol use: Yes    Comment: 1-2 drinks every 6 months    Family History  Problem Relation Age of Onset  . Depression Mother   . Diabetes Father   . Hyperlipidemia Father   . Hypertension Father   . Depression Sister   . Depression Son   . Breast cancer Maternal Grandmother      Current medication list and allergy/intolerance information reviewed:    Current Outpatient Medications  Medication Sig Dispense Refill  . amoxicillin (AMOXIL) 500 MG capsule Take 1 capsule (500 mg total) by mouth 3 (three) times daily. 21 capsule 0  . amphetamine-dextroamphetamine (ADDERALL) 10 MG tablet Take 1 tablet (10 mg total) by mouth 2 (two) times daily. 60 tablet 0  . clonazePAM (KLONOPIN) 0.5 MG tablet Take 1 tablet (0.5 mg total) by mouth 2 (two) times daily as needed for anxiety. 15 tablet 1  . escitalopram (LEXAPRO) 20 MG tablet TAKE 1 TABLET(20 MG) BY MOUTH DAILY 90 tablet 1   No current facility-administered medications for this visit.     Allergies  Allergen Reactions  . Nitrofurantoin Hives  . Other Hives    Preservative used in vaccinations  . Tetanus Toxoids Swelling    Pt states she was told the Tdap injection would have to be administered in the hospital  After discussion on 11/10 pt remembers being told it was thimersol in the tetanus 30+years ago that caused the swelling.    Santiago Bur [Nitrofurantoin Macrocrystal] Hives      Review of Systems:  Constitutional:  No  fever, no chills, No recent illness, No unintentional weight changes. No significant fatigue.   HEENT: No  headache, no vision change, no hearing change, No sore throat, No  sinus pressure  Cardiac: No  chest pain, No  pressure, No palpitations, No  Orthopnea  Respiratory:  No  shortness of breath. No  Cough  Gastrointestinal: No  abdominal pain, No  nausea  Musculoskeletal: No new myalgia/arthralgia  Skin: No  Ras  Genitourinary: No   incontinence, No  abnormal genital bleeding, No abnormal genital discharge  Hem/Onc: No  easy bruising/bleeding,  Endocrine: No cold intolerance,  No heat intolerance  Neurologic: No  weakness, No  dizziness, No  slurred speech/focal weakness/facial droop  Psychiatric: No  concerns with depression, No  concerns with anxiety  Exam:  BP 125/81 (BP Location: Left Arm, Patient Position: Sitting, Cuff Size: Normal)   Pulse 80   Temp 98.1 F (36.7 C) (Oral)   Wt 205 lb 14.4 oz (93.4 kg)   BMI 33.23 kg/m   Constitutional: VS see above. General Appearance: alert, well-developed, well-nourished, NAD  Eyes: Normal lids and conjunctive, non-icteric sclera  Ears, Nose, Mouth, Throat: MMM, Normal external inspection ears/nares/mouth/lips/gums.  Neck: No masses, trachea midline. No thyroid enlargement. No tenderness/mass appreciated. No lymphadenopathy  Respiratory: Normal respiratory effort. no wheeze, no rhonchi, no rales  Cardiovascular: S1/S2 normal, no murmur, no rub/gallop auscultated. RRR. No lower extremity edema.   Gastrointestinal: Nontender, no masses. No hepatomegaly, no splenomegaly. No hernia appreciated. Bowel sounds normal. Rectal exam deferred.   Musculoskeletal: Gait normal. No clubbing/cyanosis of digits.   Neurological: Normal balance/coordination. No tremor. No cranial nerve deficit on limited exam. Motor and sensation intact and symmetric. Cerebellar reflexes intact.   Skin: warm, dry, intact. No rash/ulcer. No concerning nevi or subq nodules on limited exam.    Psychiatric: Normal judgment/insight. Normal mood and affect. Oriented x3.  GYN: No lesions/ulcers to external genitalia, normal urethra, normal vaginal mucosa, physiologic discharge, cervix normal without lesions, uterus not enlarged or tender, adnexa no masses and nontender  BREAST: No rashes/skin changes, normal fibrous breast tissue, no masses or tenderness, normal nipple without discharge, normal  axilla    No results found for this or any previous visit (from the past 72 hour(s)).    Wt Readings from Last 3 Encounters:  03/05/18 205 lb 14.4 oz (93.4 kg)  01/04/18 207 lb 12.8 oz (94.3 kg)  12/25/17 200 lb (90.7 kg)   BP Readings from Last 3 Encounters:  03/05/18 (!) 149/71 on intake Recheck: improved 125/81  01/04/18 134/78  12/25/17 (!) 148/84     ASSESSMENT/PLAN: The primary encounter diagnosis was Annual physical exam. Diagnoses of Mixed hyperlipidemia, Prediabetes, Attention deficit disorder, unspecified hyperactivity presence, Cervical cancer screening, and Tobacco abuse counseling were also pertinent to this visit.  Info given for Chantix Discussed healthy diet/exercise Pap today Mammo ordered Will look into genetic testing for breast cancer - pt is concerned w/ FH grandmothers  Orders Placed This Encounter  Procedures  . Lipid panel  . Hemoglobin A1c  . COMPLETE METABOLIC PANEL WITH GFR    Immunization History  Administered Date(s) Administered  . Influenza Split 11/21/2012  . Influenza,inj,Quad PF,6+ Mos 12/12/2016  . Influenza-Unspecified 12/02/2014, 12/08/2015, 11/12/2016, 11/29/2017  . Pneumococcal Polysaccharide-23 03/14/2010  . Td 03/14/1982  . Tdap 01/21/2013     Patient Instructions  General Preventive Care  Most recent routine screening lipids/other labs: ordered today to follow up on sugars and cholesterol.   Tobacco: don't! Please let  me know if you need help quitting!   Alcohol: responsible moderation is ok for most adults - if you have concerns about your alcohol intake, please talk to me!   Exercise: as tolerated to reduce risk of cardiovascular disease and diabetes. Strength training will also prevent osteoporosis.   Mental health: if need for mental health care (medicines, counseling, other), please let me know!   Sexual health: if need for STD testing, or if concerns with libido/pain problems, please let me know!   Advanced  Directive: Living Will and/or Healthcare Power of Attorney recommended for all adults, regardless of age or health.  Vaccines  Flu vaccine: recommended for almost everyone, every fall.   Shingles vaccine: Shingrix recommended after age 17.   Pneumonia vaccines: Prevnar and Pneumovax recommended after age 46, or sooner if certain medical conditions. Will get records   Tetanus booster: last done 01/2013, Tdap recommended every 10 years. Cancer screenings   Colon cancer screening: recommended for everyone at age 57, but some folks need a colonoscopy sooner if risk factors   Breast cancer screening: mammogram recommended at age 50 every other year at least, and annually after age 76. Will look into genetic testing.   Cervical cancer screening: Pap every 1 to 5 years depending on age and other risk factors. Can usually stop at age 57 or w/ hysterectomy.   Lung cancer screening: CT chest every year for those sge 72 to 49 years old with ?30 pack year smoking history, who either currently smoke or have quit within the past 15 years.  Infection screenings . HIV, Gonorrhea/Chlamydia: screening as needed . Hepatitis C: recommended for anyone born 13-1967 - not needed . TB: certain at-risk populations, or depending on work requirements and/or travel history Other  Bone Density Test: recommended for women at age 8            Visit summary with medication list and pertinent instructions was printed for patient to review. All questions at time of visit were answered - patient instructed to contact office with any additional concerns or updates. ER/RTC precautions were reviewed with the patient.    Please note: voice recognition software was used to produce this document, and typos may escape review. Please contact Dr. Sheppard Coil for any needed clarifications.     Follow-up plan: Return in about 6 months (around 09/04/2018) for ADD medication maintenance - sooner if needed .

## 2018-03-06 LAB — LIPID PANEL
CHOL/HDL RATIO: 5.5 (calc) — AB (ref <5.0)
Cholesterol: 210 mg/dL — ABNORMAL HIGH (ref <200)
HDL: 38 mg/dL — ABNORMAL LOW (ref >50)
LDL Cholesterol (Calc): 146 mg/dL (calc) — ABNORMAL HIGH
Non-HDL Cholesterol (Calc): 172 mg/dL (calc) — ABNORMAL HIGH (ref <130)
TRIGLYCERIDES: 139 mg/dL (ref <150)

## 2018-03-06 LAB — COMPLETE METABOLIC PANEL WITH GFR
AG Ratio: 1.5 (calc) (ref 1.0–2.5)
ALT: 10 U/L (ref 6–29)
AST: 11 U/L (ref 10–35)
Albumin: 3.8 g/dL (ref 3.6–5.1)
Alkaline phosphatase (APISO): 60 U/L (ref 33–115)
BUN: 10 mg/dL (ref 7–25)
CALCIUM: 8.7 mg/dL (ref 8.6–10.2)
CO2: 24 mmol/L (ref 20–32)
CREATININE: 0.85 mg/dL (ref 0.50–1.10)
Chloride: 108 mmol/L (ref 98–110)
GFR, EST NON AFRICAN AMERICAN: 80 mL/min/{1.73_m2} (ref >=60)
GFR, Est African American: 93 mL/min/{1.73_m2} (ref >=60)
GLOBULIN: 2.5 g/dL (ref 1.9–3.7)
Glucose, Bld: 99 mg/dL (ref 65–99)
Potassium: 4 mmol/L (ref 3.5–5.3)
SODIUM: 140 mmol/L (ref 135–146)
Total Bilirubin: 0.3 mg/dL (ref 0.2–1.2)
Total Protein: 6.3 g/dL (ref 6.1–8.1)

## 2018-03-06 LAB — HEMOGLOBIN A1C
Hgb A1c MFr Bld: 5.8 % of total Hgb — ABNORMAL HIGH (ref <5.7)
Mean Plasma Glucose: 120 (calc)
eAG (mmol/L): 6.6 (calc)

## 2018-03-08 LAB — CYTOLOGY - PAP
DIAGNOSIS: NEGATIVE
HPV (WINDOPATH): NOT DETECTED

## 2018-03-11 ENCOUNTER — Encounter: Payer: Self-pay | Admitting: Emergency Medicine

## 2018-03-11 ENCOUNTER — Emergency Department (INDEPENDENT_AMBULATORY_CARE_PROVIDER_SITE_OTHER): Payer: 59

## 2018-03-11 ENCOUNTER — Emergency Department (INDEPENDENT_AMBULATORY_CARE_PROVIDER_SITE_OTHER)
Admission: EM | Admit: 2018-03-11 | Discharge: 2018-03-11 | Disposition: A | Discharge status: Home / Self Care | Payer: 59 | Source: Home / Self Care | Attending: Family Medicine | Admitting: Family Medicine

## 2018-03-11 ENCOUNTER — Other Ambulatory Visit: Payer: Self-pay

## 2018-03-11 DIAGNOSIS — J40 Bronchitis, not specified as acute or chronic: Secondary | ICD-10-CM | POA: Diagnosis not present

## 2018-03-11 DIAGNOSIS — R05 Cough: Secondary | ICD-10-CM | POA: Diagnosis not present

## 2018-03-11 DIAGNOSIS — M940 Chondrocostal junction syndrome [Tietze]: Secondary | ICD-10-CM

## 2018-03-11 HISTORY — DX: Bronchitis, not specified as acute or chronic: J40

## 2018-03-11 MED ORDER — DOXYCYCLINE HYCLATE 100 MG PO CAPS: 100.0000 mg | ORAL_CAPSULE | Freq: Two times a day (BID) | ORAL | 0 refills | Status: DC

## 2018-03-11 MED ORDER — PREDNISONE 20 MG PO TABS: ORAL_TABLET | ORAL | 0 refills | Status: DC

## 2018-03-11 NOTE — ED Provider Notes (Signed)
[name]    CSN: [id number] Arrival date & time: 03/11/18  1517     History   Chief Complaint Chief Complaint  Patient presents with  . Cough    HPI Sarah Nicholson is a 47 y.o. female.   Patient developed sinus congestion one week ago, followed the next day by a productive cough worse at night.  She believes that she may have had some wheezes at night.  Today she feels off-balance and has developed pain in her upper anterior chest with coughing. She has a history of bronchitis.  She continues to smoke.  The history is provided by the patient.    Past Medical History:  Diagnosis Date  . ADD (attention deficit disorder) 06/14/2012  . Bronchitis   . Depression 06/14/2012  . History of leukocytosis 06/14/2012   Reports "WBC 32" decades ago with negative hematology specialist workup.   [name] Hyperlipidemia 06/14/2012  . Prediabetes 06/14/2012  . Seasonal allergies 06/14/2012    Patient Active Problem List   Diagnosis Date Noted  . Sun-induced skin changes, keratosis 08/08/2017  . Subcutaneous cyst 08/08/2017  . Irritable bowel syndrome (IBS) 05/04/2017  . Generalized anxiety disorder 03/16/2016  . Social anxiety disorder 03/16/2016  . Major depressive disorder, recurrent episode, moderate ([place]) 03/16/2016  . Right inguinal hernia 07/14/2015  . Hallux valgus 12/09/2014  . Acute headache 07/31/2014  . Chest pain 07/31/2014  . Hypotension 07/31/2014  . Other and unspecified ovarian cyst 06/25/2013  . Left tennis elbow 06/25/2013  . Left breast mass 06/27/2012  . Microscopic hematuria 06/27/2012  . Hyperlipidemia 06/14/2012  . Prediabetes 06/14/2012  . History of leukocytosis 06/14/2012  . Seasonal allergies 06/14/2012  . Depression 06/14/2012  . ADD (attention deficit disorder) 06/14/2012    Past Surgical History:  Procedure Laterality Date  . APPENDECTOMY    . bladder sling/mesh    . INGUINAL HERNIA REPAIR    . intrauterine ablation    . NASAL SINUS  SURGERY    . TUBAL LIGATION      OB History   No obstetric history on file.      Home Medications    Prior to Admission medications   Medication Sig Start Date End Date Taking? Authorizing Provider  amphetamine-dextroamphetamine (ADDERALL) 10 MG tablet Take 1 tablet (10 mg total) by mouth 2 (two) times daily. 01/04/18 02/03/18  Emeterio Reeve, DO  clonazePAM (KLONOPIN) 0.5 MG tablet Take 1 tablet (0.5 mg total) by mouth 2 (two) times daily as needed for anxiety. 01/04/18   Emeterio Reeve, DO  doxycycline (VIBRAMYCIN) 100 MG capsule Take 1 capsule (100 mg total) by mouth 2 (two) times daily. Take with food. 03/11/18   Kandra Nicolas, MD  escitalopram (LEXAPRO) 20 MG tablet TAKE 1 TABLET(20 MG) BY MOUTH DAILY 10/23/17   Emeterio Reeve, DO  predniSONE (DELTASONE) 20 MG tablet Take one tab by mouth twice daily for 4 days, then one daily. Take with food. 03/11/18   Kandra Nicolas, MD    Family History Family History  Problem Relation Age of Onset  . Depression Mother   . Diabetes Father   . Hyperlipidemia Father   . Hypertension Father   . Depression Sister   . Depression Son   . Breast cancer Maternal Grandmother     Social History Social History   Tobacco Use  . Smoking status: Current Every Day Smoker    Types: Cigarettes  . Smokeless tobacco: Never Used  Substance Use Topics  . Alcohol  use: Yes    Comment: 1-2 drinks every 6 months  . Drug use: No     Allergies   Nitrofurantoin; Other; Tetanus toxoids; and Macrobid [nitrofurantoin macrocrystal]   Review of Systems Review of Systems No sore throat + cough No pleuritic pain, but developed pain in upper anterior chest with cough today. ? wheezing + nasal congestion + post-nasal drainage No sinus pain/pressure No itchy/red eyes No earache + dizzy No hemoptysis No SOB No fever, + chills No nausea No vomiting No abdominal pain No diarrhea No urinary symptoms No skin rash + fatigue No  myalgias No headache Used OTC meds without relief   Physical Exam Triage Vital Signs ED Triage Vitals  Enc Vitals Group     BP 03/11/18 1625 132/82     Pulse Rate 03/11/18 1625 86     Resp 03/11/18 1625 18     Temp 03/11/18 1625 98.5 F (36.9 C)     Temp Source 03/11/18 1625 Oral     SpO2 03/11/18 1625 97 %     Weight 03/11/18 1626 200 lb (90.7 kg)     Height 03/11/18 1626 5\' 6"  (1.676 m)     Head Circumference --      Peak Flow --      Pain Score 03/11/18 1626 1     Pain Loc --      Pain Edu? --      Excl. in [place]? --    No data found.  Updated Vital Signs BP 132/82 (BP Location: Right Arm)   Pulse 86   Temp 98.5 F (36.9 C) (Oral)   Resp 18   Ht 5\' 6"  (1.676 m)   Wt 90.7 kg   SpO2 97%   BMI 32.28 kg/m   Visual Acuity Right Eye Distance:   Left Eye Distance:   Bilateral Distance:    Right Eye Near:   Left Eye Near:    Bilateral Near:     Physical Exam Chest:       Comments: Chest:  Distinct tenderness to palpation over the upper chest and sternum as noted on diagram.   Nursing notes and Vital Signs reviewed. Appearance:  Patient appears stated age, and in no acute distress Eyes:  Pupils are equal, round, and reactive to light and accomodation.  Extraocular movement is intact.  Conjunctivae are not inflamed  Ears:  Canals normal.  Tympanic membranes normal.  Nose:  Mildly congested turbinates.  No sinus tenderness.   Pharynx:  Normal Neck:  Supple.  Enlarged posterior/lateral nodes are palpated bilaterally, tender to palpation on the left.   Lungs:  Scattered rhonchi.  Breath sounds are equal.  Moving air well.  She has tenderness over her sternum as noted on diagram above. Heart:  Regular rate and rhythm without murmurs, rubs, or gallops.  Abdomen:  Nontender without masses or hepatosplenomegaly.  Bowel sounds are present.  No CVA or flank tenderness.  Extremities:  No edema.  Skin:  No rash present.     UC Treatments / Results  Labs (all labs  ordered are listed, but only abnormal results are displayed) Labs Reviewed - No data to display  EKG None  Radiology Dg Chest 2 View  Result Date: 03/11/2018 CLINICAL DATA:  Productive cough x6 days. EXAM: CHEST - 2 VIEW COMPARISON:  None. FINDINGS: The heart size and mediastinal contours are within normal limits. Both lungs are clear. The visualized skeletal structures are unremarkable. IMPRESSION: No active cardiopulmonary disease. Electronically Signed  By: Ashley Royalty M.D.   On: 03/11/2018 17:27    Procedures Procedures (including critical care time)  Medications Ordered in UC Medications - No data to display  Initial Impression / Assessment and Plan / UC Course  I have reviewed the triage vital signs and the nursing notes.  Pertinent labs & imaging results that were available during my care of the patient were reviewed by me and considered in my medical decision making (see chart for details).    Because of patient's smoking history, will begin empiric doxycycline, and prednisone burst/taper. Followup with Family Doctor if not improved in about 8 days.   Final Clinical Impressions(s) / UC Diagnoses   Final diagnoses:  Bronchitis  Costochondritis     Discharge Instructions     Take plain guaifenesin (1200mg  extended release tabs such as Mucinex) twice daily, with plenty of water, for cough and congestion.  Get adequate rest.   May use Afrin nasal spray (or generic oxymetazoline) each morning for about 5 days and then discontinue.  Also recommend using saline nasal spray several times daily and saline nasal irrigation (AYR is a common brand).  Use Flonase nasal spray each morning after using Afrin nasal spray and saline nasal irrigation. Try warm salt water gargles for sore throat.  Stop all antihistamines for now, and other non-prescription cough/cold preparations. May take Delsym Cough Suppressant at bedtime for nighttime cough.     ED Prescriptions    Medication  Sig Dispense Auth. Provider   doxycycline (VIBRAMYCIN) 100 MG capsule Take 1 capsule (100 mg total) by mouth 2 (two) times daily. Take with food. 14 capsule Kandra Nicolas, MD   predniSONE (DELTASONE) 20 MG tablet Take one tab by mouth twice daily for 4 days, then one daily. Take with food. 12 tablet Kandra Nicolas, MD        Kandra Nicolas, MD 03/15/18 [phone]

## 2018-03-11 NOTE — Discharge Instructions (Addendum)
Take plain guaifenesin (1200mg extended release tabs such as Mucinex) twice daily, with plenty of water, for cough and congestion.   Get adequate rest.   °May use Afrin nasal spray (or generic oxymetazoline) each morning for about 5 days and then discontinue.  Also recommend using saline nasal spray several times daily and saline nasal irrigation (AYR is a common brand).  Use Flonase nasal spray each morning after using Afrin nasal spray and saline nasal irrigation. °Try warm salt water gargles for sore throat.  °Stop all antihistamines for now, and other non-prescription cough/cold preparations. °May take Delsym Cough Suppressant at bedtime for nighttime cough.  °  °

## 2018-03-11 NOTE — ED Triage Notes (Signed)
Patient has developed harsh cough over past 6 days; was taking thera-flu but today switched to mucinex and ibuprofen at 1400.

## 2018-03-19 ENCOUNTER — Ambulatory Visit (INDEPENDENT_AMBULATORY_CARE_PROVIDER_SITE_OTHER): Payer: 59 | Admitting: Osteopathic Medicine

## 2018-03-19 ENCOUNTER — Encounter: Payer: Self-pay | Admitting: Osteopathic Medicine

## 2018-03-19 DIAGNOSIS — M940 Chondrocostal junction syndrome [Tietze]: Secondary | ICD-10-CM

## 2018-03-19 DIAGNOSIS — R091 Pleurisy: Secondary | ICD-10-CM

## 2018-03-19 MED ORDER — OXYCODONE-ACETAMINOPHEN 5-325 MG PO TABS: 1.0000 | ORAL_TABLET | Freq: Four times a day (QID) | ORAL | 0 refills | Status: DC | PRN

## 2018-03-19 MED ORDER — NAPROXEN 500 MG PO TABS: 500.0000 mg | ORAL_TABLET | Freq: Two times a day (BID) | ORAL | 0 refills | Status: DC

## 2018-03-19 NOTE — Patient Instructions (Addendum)
Plan:  Will trial Naproxen, different antiinflammatory. Hold the Ibuprofen.   Will trial short course of opiate pain medications for severe pain.  If trouble breathing or severe chest pain, please seek emergency care!     Pleurisy/Pleuritis  Pleurisy, also called pleuritis, is irritation and swelling (inflammation) of the linings of the lungs. The linings of the lungs are called pleura. They cover the outside of the lungs and the inside of the chest wall. There is a small amount of fluid (pleural fluid) between the pleura that allows the lungs to move in and out smoothly when you breathe. Pleurisy causes the pleura to be rough and dry and to rub together when you breathe, which is painful. In some cases, pleurisy can cause pleural fluid to build up between the pleura (pleural effusion). What are the causes? Common causes of this condition include:  Common:   lung infection caused by bacteria or a virus.  Rare:  A blood clot that travels to the lung (pulmonary embolism).  Air leaking into the pleural space (pneumothorax).  Lung cancer or a lung tumor.  A chest injury.  Diseases that can cause lung inflammation. These include rheumatoid arthritis, lupus, sickle cell disease, inflammatory bowel disease, and pancreatitis.  Heart or chest surgery.  Lung damage from inhaling asbestos.  A lung reaction to certain medicines. Sometimes the cause is unknown. What are the signs or symptoms? Chest pain is the main symptom of this condition. The pain is usually on one side. Chest pain may start suddenly and be sharp or stabbing. It may become a constant dull ache. You may also feel pain in your back or shoulder. The pain may get worse when you cough, take deep breaths, or make sudden movements. Other symptoms may include:  Shortness of breath.  Noisy breathing (wheezing).  Cough.  Chills.  Fever. How is this diagnosed? This condition may be diagnosed based on:  Your medical  history.  Your symptoms.  A physical exam. Your health care provider will listen to your breathing with a stethoscope to check for a rough, rubbing sound (friction rub). If you have pleural effusion, your breathing sounds may be muffled.  Tests, such as: ? Blood tests to check for infections or diseases and to measure the oxygen in your blood. ? Imaging studies of your lungs. These may include a chest X-ray, ultrasound, MRI, or CT scan. ? A procedure to remove pleural fluid with a needle for testing (thoracentesis). How is this treated? Treatment for this condition depends on the cause. Pleurisy that was caused by a virus usually clears up within 2 weeks. Treatment for pleurisy may include:  NSAIDs to help relieve pain and swelling.  Antibiotic medicines, if your condition was caused by a bacterial infection.  Prescription pain or cough medicine.  Medicines to dissolve a blood clot, if your condition was caused by pulmonary embolism.  Removal of pleural fluid or air. Follow these instructions at home: Medicines  Take over-the-counter and prescription medicines only as told by your health care provider.  If you were prescribed an antibiotic, take it as told by your health care provider. Do not stop taking the antibiotic even if you start to feel better. Activity  Rest and return to your normal activities as told by your health care provider. Ask your health care provider what activities are safe for you.  Do not drive or use heavy machinery while taking prescription pain medicine. General instructions   Monitor your pleurisy for any changes.  Take deep breaths often, even if it is painful. This can help prevent lung infection (pneumonia) and collapse of lung tissue (atelectasis).  When lying down, lie on your painful side. This may reduce pain.  Do not smoke. If you need help quitting, ask your health care provider.  Keep all follow-up visits as told by your health care  provider. This is important. Contact a health care provider if:  You have pain that: ? Gets worse. ? Does not get better with medicine. ? Lasts for more than 1 week.  You have a fever or chills.  Your cough or shortness of breath is not improving at home.  You cough up pus-like (purulent) secretions. Get help right away if:  Your lips, fingernails, or toenails darken or turn blue.  You cough up blood.  You have any of the following symptoms that get worse: ? Difficulty breathing. ? Shortness of breath. ? Wheezing.  You have pain that spreads into your neck, arms, or jaw.  You develop a rash.  You vomit.  You faint. Summary  Pleurisy is inflammation of the linings of the lungs (pleura).  Pleurisy causes pain that makes it difficult for you to breathe or cough.  Pleurisy is often caused by an underlying infection or disease.  Treatment of pleurisy depends on the cause, and it often includes medicines. This information is not intended to replace advice given to you by your health care provider. Make sure you discuss any questions you have with your health care provider. Document Released: 02/28/2005 Document Revised: 11/23/2015 Document Reviewed: 11/23/2015 Elsevier Interactive Patient Education  [employer].

## 2018-03-19 NOTE — Progress Notes (Signed)
HPI: Sarah Nicholson is a 48 y.o. female who  has a past medical history of ADD (attention deficit disorder) (06/14/2012), Bronchitis, Depression (06/14/2012), History of leukocytosis (06/14/2012), Hyperlipidemia (06/14/2012), Prediabetes (06/14/2012), and Seasonal allergies (06/14/2012).  she presents to [facility] today, 03/19/18,  for chief complaint of:  Follow-up cough, costochondirits, bronchitis  Seen in UC 03/11/18 (8 days ago) w/ 1 day of chest discomfort R upper/anterior w/ coughing. Smoker. Dx costochondritis. Tx steroid burst and Doxycycline for bronchitis. CXR was ok.    Now reports chest discomfort under R breast area. Sharp pain with breathing in, coughing. Otherwise dull constant pain. Cough is overall better.        At today's visit... Past medical history, surgical history, and family history reviewed and updated as needed.  Current medication list and allergy/intolerance information reviewed and updated as needed. (See remainder of HPI, ROS, Phys Exam below)           ASSESSMENT/PLAN: Diagnoses of Pleuritis and Costochondritis were pertinent to this visit.    Meds ordered this encounter  Medications  . naproxen (NAPROSYN) 500 MG tablet    Sig: Take 1 tablet (500 mg total) by mouth 2 (two) times daily with a meal. Daily for one week then as needed after that    Dispense:  60 tablet    Refill:  0  . DISCONTD: oxyCODONE-acetaminophen (PERCOCET) 5-325 MG tablet    Sig: Take 1-2 tablets by mouth every 6 (six) hours as needed for severe pain.    Dispense:  10 tablet    Refill:  0  . oxyCODONE-acetaminophen (PERCOCET) 5-325 MG tablet    Sig: Take 1-2 tablets by mouth every 6 (six) hours as needed for severe pain.    Dispense:  10 tablet    Refill:  0    Patient Instructions  Plan:  Will trial Naproxen, different antiinflammatory. Hold the Ibuprofen.   Will trial short course of opiate pain medications for severe pain.  If  trouble breathing or severe chest pain, please seek emergency care!     Pleurisy/Pleuritis  Pleurisy, also called pleuritis, is irritation and swelling (inflammation) of the linings of the lungs. The linings of the lungs are called pleura. They cover the outside of the lungs and the inside of the chest wall. There is a small amount of fluid (pleural fluid) between the pleura that allows the lungs to move in and out smoothly when you breathe. Pleurisy causes the pleura to be rough and dry and to rub together when you breathe, which is painful. In some cases, pleurisy can cause pleural fluid to build up between the pleura (pleural effusion). What are the causes? Common causes of this condition include:  Common:   lung infection caused by bacteria or a virus.  Rare:  A blood clot that travels to the lung (pulmonary embolism).  Air leaking into the pleural space (pneumothorax).  Lung cancer or a lung tumor.  A chest injury.  Diseases that can cause lung inflammation. These include rheumatoid arthritis, lupus, sickle cell disease, inflammatory bowel disease, and pancreatitis.  Heart or chest surgery.  Lung damage from inhaling asbestos.  A lung reaction to certain medicines. Sometimes the cause is unknown. What are the signs or symptoms? Chest pain is the main symptom of this condition. The pain is usually on one side. Chest pain may start suddenly and be sharp or stabbing. It may become a constant dull ache. You may also feel pain in your  back or shoulder. The pain may get worse when you cough, take deep breaths, or make sudden movements. Other symptoms may include:  Shortness of breath.  Noisy breathing (wheezing).  Cough.  Chills.  Fever. How is this diagnosed? This condition may be diagnosed based on:  Your medical history.  Your symptoms.  A physical exam. Your health care provider will listen to your breathing with a stethoscope to check for a rough, rubbing sound  (friction rub). If you have pleural effusion, your breathing sounds may be muffled.  Tests, such as: ? Blood tests to check for infections or diseases and to measure the oxygen in your blood. ? Imaging studies of your lungs. These may include a chest X-ray, ultrasound, MRI, or CT scan. ? A procedure to remove pleural fluid with a needle for testing (thoracentesis). How is this treated? Treatment for this condition depends on the cause. Pleurisy that was caused by a virus usually clears up within 2 weeks. Treatment for pleurisy may include:  NSAIDs to help relieve pain and swelling.  Antibiotic medicines, if your condition was caused by a bacterial infection.  Prescription pain or cough medicine.  Medicines to dissolve a blood clot, if your condition was caused by pulmonary embolism.  Removal of pleural fluid or air. Follow these instructions at home: Medicines  Take over-the-counter and prescription medicines only as told by your health care provider.  If you were prescribed an antibiotic, take it as told by your health care provider. Do not stop taking the antibiotic even if you start to feel better. Activity  Rest and return to your normal activities as told by your health care provider. Ask your health care provider what activities are safe for you.  Do not drive or use heavy machinery while taking prescription pain medicine. General instructions   Monitor your pleurisy for any changes.  Take deep breaths often, even if it is painful. This can help prevent lung infection (pneumonia) and collapse of lung tissue (atelectasis).  When lying down, lie on your painful side. This may reduce pain.  Do not smoke. If you need help quitting, ask your health care provider.  Keep all follow-up visits as told by your health care provider. This is important. Contact a health care provider if:  You have pain that: ? Gets worse. ? Does not get better with medicine. ? Lasts for more  than 1 week.  You have a fever or chills.  Your cough or shortness of breath is not improving at home.  You cough up pus-like (purulent) secretions. Get help right away if:  Your lips, fingernails, or toenails darken or turn blue.  You cough up blood.  You have any of the following symptoms that get worse: ? Difficulty breathing. ? Shortness of breath. ? Wheezing.  You have pain that spreads into your neck, arms, or jaw.  You develop a rash.  You vomit.  You faint. Summary  Pleurisy is inflammation of the linings of the lungs (pleura).  Pleurisy causes pain that makes it difficult for you to breathe or cough.  Pleurisy is often caused by an underlying infection or disease.  Treatment of pleurisy depends on the cause, and it often includes medicines. This information is not intended to replace advice given to you by your health care provider. Make sure you discuss any questions you have with your health care provider. Document Released: 02/28/2005 Document Revised: 11/23/2015 Document Reviewed: 11/23/2015 Elsevier Interactive Patient Education  [employer].  Follow-up plan: Return if symptoms worsen or fail to improve.                             ############################################ ############################################ ############################################ ############################################    Current Meds  Medication Sig  . clonazePAM (KLONOPIN) 0.5 MG tablet Take 1 tablet (0.5 mg total) by mouth 2 (two) times daily as needed for anxiety.  [name] escitalopram (LEXAPRO) 20 MG tablet TAKE 1 TABLET(20 MG) BY MOUTH DAILY    Allergies  Allergen Reactions  . Nitrofurantoin Hives  . Other Hives    Preservative used in vaccinations  . Tetanus Toxoids Swelling    Pt states she was told the Tdap injection would have to be administered in the hospital  After discussion on 11/10 pt remembers being  told it was thimersol in the tetanus 30+years ago that caused the swelling.    Santiago Bur [Nitrofurantoin Macrocrystal] Hives       Review of Systems:  Constitutional: +recent illness  HEENT: No  headache, no vision change  Cardiac: No  chest pain, No  pressure, No palpitations  Respiratory:  No  shortness of breath. +Cough  Gastrointestinal: No  abdominal pain, no change on bowel habits  Musculoskeletal: No new myalgia/arthralgia  Neurologic: No  weakness, No  Dizziness   Exam:  BP 117/76 (BP Location: Left Arm, Patient Position: Sitting, Cuff Size: Normal)   Pulse 84   Temp 98.8 F (37.1 C) (Oral)   Wt 204 lb 8 oz (92.8 kg)   BMI 33.01 kg/m   Constitutional: VS see above. General Appearance: alert, well-developed, well-nourished, NAD  Eyes: Normal lids and conjunctive, non-icteric sclera  Ears, Nose, Mouth, Throat: MMM, Normal external inspection ears/nares/mouth/lips/gums.  Neck: No masses, trachea midline.   Respiratory: Normal respiratory effort. no wheeze, no rhonchi, no rales  Cardiovascular: S1/S2 normal, no murmur, no rub/gallop auscultated. RRR.   Musculoskeletal: Gait normal. Symmetric and independent movement of all extremities. +Tenderness R costochondral junction near breast   Neurological: Normal balance/coordination. No tremor.  Skin: warm, dry, intact.   Psychiatric: Normal judgment/insight. Normal mood and affect. Oriented x3.       Visit summary with medication list and pertinent instructions was printed for patient to review, patient was advised to alert [place] if any updates are needed. All questions at time of visit were answered - patient instructed to contact office with any additional concerns. ER/RTC precautions were reviewed with the patient and understanding verbalized.     Please note: voice recognition software was used to produce this document, and typos may escape review. Please contact Dr. Sheppard Coil for any needed  clarifications.    Follow up plan: Return if symptoms worsen or fail to improve.

## 2018-03-29 ENCOUNTER — Ambulatory Visit (INDEPENDENT_AMBULATORY_CARE_PROVIDER_SITE_OTHER): Payer: 59

## 2018-03-29 DIAGNOSIS — R928 Other abnormal and inconclusive findings on diagnostic imaging of breast: Secondary | ICD-10-CM

## 2018-04-02 ENCOUNTER — Other Ambulatory Visit: Payer: Self-pay | Admitting: Osteopathic Medicine

## 2018-04-02 DIAGNOSIS — R928 Other abnormal and inconclusive findings on diagnostic imaging of breast: Secondary | ICD-10-CM

## 2018-04-05 ENCOUNTER — Ambulatory Visit
Admission: RE | Admit: 2018-04-05 | Discharge: 2018-04-05 | Disposition: A | Discharge status: Home / Self Care | Payer: 59 | Source: Ambulatory Visit | Attending: Osteopathic Medicine | Admitting: Osteopathic Medicine

## 2018-04-05 ENCOUNTER — Ambulatory Visit: Payer: Self-pay

## 2018-04-05 DIAGNOSIS — R928 Other abnormal and inconclusive findings on diagnostic imaging of breast: Secondary | ICD-10-CM

## 2018-04-25 ENCOUNTER — Other Ambulatory Visit: Payer: Self-pay | Admitting: Osteopathic Medicine

## 2018-04-25 DIAGNOSIS — F329 Major depressive disorder, single episode, unspecified: Secondary | ICD-10-CM

## 2018-04-25 DIAGNOSIS — F32A Depression, unspecified: Secondary | ICD-10-CM

## 2018-04-25 DIAGNOSIS — F411 Generalized anxiety disorder: Secondary | ICD-10-CM

## 2018-04-25 NOTE — Telephone Encounter (Signed)
Please review for refill- patient at [facility] 

## 2018-05-17 ENCOUNTER — Ambulatory Visit (INDEPENDENT_AMBULATORY_CARE_PROVIDER_SITE_OTHER): Payer: 59 | Admitting: Family Medicine

## 2018-05-17 ENCOUNTER — Encounter: Payer: Self-pay | Admitting: Family Medicine

## 2018-05-17 VITALS — BP 145/71 | HR 61 | Temp 98.1°F | Wt 208.8 lb

## 2018-05-17 DIAGNOSIS — M25511 Pain in right shoulder: Secondary | ICD-10-CM

## 2018-05-17 MED ORDER — DICLOFENAC SODIUM 1 % TD GEL: 2.0000 g | Freq: Four times a day (QID) | TRANSDERMAL | 11 refills | Status: DC

## 2018-05-17 NOTE — Progress Notes (Signed)
Sarah Nicholson is a 48 y.o. female who presents to [facility] today for right shoulder pain.  Sarah Nicholson has a 29-month history of slowly worsening right shoulder pain.  She notes pain is located primarily in the lateral upper arm.  Pain is worse with overhead motion and reaching back and at bedtime.  She denies any pain radiating below the level of the elbow.  She denies any weakness or numbness.  She is tried taking ibuprofen which helps but only temporarily.  She is concerned because the pain is slowly worsening and is now interfering with normal home activities and activities at work.  She denies any injury history.  She is not tried any home exercises yet.     ROS:  As above  Exam:  BP (!) 155/71 (BP Location: Left Arm, Patient Position: Sitting, Cuff Size: Normal)   Pulse 63   Temp 98.1 F (36.7 C) (Oral)   Wt 208 lb 12.8 oz (94.7 kg)   BMI 33.70 kg/m  Wt Readings from Last 5 Encounters:  05/17/18 208 lb 12.8 oz (94.7 kg)  03/19/18 204 lb 8 oz (92.8 kg)  03/11/18 200 lb (90.7 kg)  03/05/18 205 lb 14.4 oz (93.4 kg)  01/04/18 207 lb 12.8 oz (94.3 kg)   General: Well Developed, well nourished, and in no acute distress.  Neuro/Psych: Alert and oriented x3, extra-ocular muscles intact, able to move all 4 extremities, sensation grossly intact. Skin: Warm and dry, no rashes noted.  Respiratory: Not using accessory muscles, speaking in full sentences, trachea midline.  Cardiovascular: Pulses palpable, no extremity edema. Abdomen: Does not appear distended. MSK:  C-spine: Nontender to spinal midline normal cervical motion.  Shoulder: Normal-appearing nontender. Abduction full range of motion pain beyond 100 degrees. Normal external rotation. Internal rotation limited to lumbar spine. Strength is intact. Positive Hawkins and Neer's test. Positive empty can test. Negative Yergason's and speeds test.  Left shoulder normal-appearing  nontender normal motion normal strength negative impingement testing.  Pulses intact bilateral upper extremity.      Assessment and Plan: 48 y.o. female with right shoulder pain.  Very likely rotator cuff tendinopathy.  Plan for home exercise program, referral to physical therapy, and diclofenac gel.  Recheck in 4 to 6 weeks.  Next up would be consider injection.  Additionally would obtain x-ray of her right shoulder at that time.  X-ray deferred today as she does not have a injury history and has normal strength.   PDMP not reviewed this encounter. Orders Placed This Encounter  Procedures  . Ambulatory referral to Physical Therapy    Referral Priority:   Routine    Referral Type:   Physical Medicine    Referral Reason:   Specialty Services Required    Requested Specialty:   Physical Therapy   Meds ordered this encounter  Medications  . diclofenac sodium (VOLTAREN) 1 % GEL    Sig: Apply 2 g topically 4 (four) times daily. To affected joint.    Dispense:  100 g    Refill:  11    Historical information moved to improve visibility of documentation.  Past Medical History:  Diagnosis Date  . ADD (attention deficit disorder) 06/14/2012  . Bronchitis   . Depression 06/14/2012  . History of leukocytosis 06/14/2012   Reports "WBC 32" decades ago with negative hematology specialist workup.   [name] Hyperlipidemia 06/14/2012  . Prediabetes 06/14/2012  . Seasonal allergies 06/14/2012   Past Surgical History:  Procedure Laterality Date  .  APPENDECTOMY    . bladder sling/mesh    . INGUINAL HERNIA REPAIR    . intrauterine ablation    . NASAL SINUS SURGERY    . TUBAL LIGATION     Social History   Tobacco Use  . Smoking status: Current Every Day Smoker    Types: Cigarettes  . Smokeless tobacco: Never Used  Substance Use Topics  . Alcohol use: Yes    Comment: 1-2 drinks every 6 months   family history includes Breast cancer in her maternal grandmother; Depression in her mother, sister, and  son; Diabetes in her father; Hyperlipidemia in her father; Hypertension in her father.  Medications: Current Outpatient Medications  Medication Sig Dispense Refill  . clonazePAM (KLONOPIN) 0.5 MG tablet Take 1 tablet (0.5 mg total) by mouth 2 (two) times daily as needed for anxiety. 15 tablet 1  . escitalopram (LEXAPRO) 20 MG tablet TAKE 1 TABLET(20 MG) BY MOUTH DAILY 90 tablet 1  . amphetamine-dextroamphetamine (ADDERALL) 10 MG tablet Take 1 tablet (10 mg total) by mouth 2 (two) times daily. 60 tablet 0  . diclofenac sodium (VOLTAREN) 1 % GEL Apply 2 g topically 4 (four) times daily. To affected joint. 100 g 11  . naproxen (NAPROSYN) 500 MG tablet Take 1 tablet (500 mg total) by mouth 2 (two) times daily with a meal. Daily for one week then as needed after that (Patient not taking: Reported on 05/17/2018) 60 tablet 0  . oxyCODONE-acetaminophen (PERCOCET) 5-325 MG tablet Take 1-2 tablets by mouth every 6 (six) hours as needed for severe pain. (Patient not taking: Reported on 05/17/2018) 10 tablet 0   No current facility-administered medications for this visit.    Allergies  Allergen Reactions  . Nitrofurantoin Hives  . Other Hives    Preservative used in vaccinations  . Tetanus Toxoids Swelling    Pt states she was told the Tdap injection would have to be administered in the hospital  After discussion on 11/10 pt remembers being told it was thimersol in the tetanus 30+years ago that caused the swelling.    Santiago Bur [Nitrofurantoin Macrocrystal] Hives      Discussed warning signs or symptoms. Please see discharge instructions. Patient expresses understanding.

## 2018-05-17 NOTE — Patient Instructions (Signed)
Thank you for coming in today. Attend physical therapy and do home exercises.  Use the diclofenac gel up 4x daily.  Recheck in 4-6 weeks.  We can do an injection at any time.     Rotator Cuff Tendinitis  Rotator cuff tendinitis is inflammation of the tough, cord-like bands that connect muscle to bone (tendons) in the rotator cuff. The rotator cuff includes all of the muscles and tendons that connect the arm to the shoulder. The rotator cuff holds the head of the upper arm bone (humerus) in the cup (fossa) of the shoulder blade (scapula). This condition can lead to a long-lasting (chronic) tear. The tear may be partial or complete. What are the causes? This condition is usually caused by overusing the rotator cuff. What increases the risk? This condition is more likely to develop in athletes and workers who frequently use their shoulder or reach over their heads. This can include activities such as:  Tennis.  Baseball or softball.  Swimming.  Construction work.  Painting. What are the signs or symptoms? Symptoms of this condition include:  Pain spreading (radiating) from the shoulder to the upper arm.  Swelling and tenderness in front of the shoulder.  Pain when reaching, pulling, or lifting the arm above the head.  Pain when lowering the arm from above the head.  Minor pain in the shoulder when resting.  Increased pain in the shoulder at night.  Difficulty placing the arm behind the back. How is this diagnosed? This condition is diagnosed with a medical history and physical exam. Tests may also be done, including:  X-rays.  MRI.  Ultrasounds.  CT or MR arthrogram. During this test, a contrast material is injected and then images are taken. How is this treated? Treatment for this condition depends on the severity of the condition. In less severe cases, treatment may include:  Rest. This may be done with a sling that holds the shoulder still (immobilization). Your  health care provider may also recommend avoiding activities that involve lifting your arm over your head.  Icing the shoulder.  Anti-inflammatory medicines, such as aspirin or ibuprofen. In more severe cases, treatment may include:  Physical therapy.  Steroid injections.  Surgery. Follow these instructions at home: If you have a sling:  Wear the sling as told by your health care provider. Remove it only as told by your health care provider.  Loosen the sling if your fingers tingle, become numb, or turn cold and blue.  Keep the sling clean.  If the sling is not waterproof, do not let it get wet. Remove it, if allowed, or cover it with a watertight covering when you take a bath or shower. Managing pain, stiffness, and swelling  If directed, put ice on the injured area. ? If you have a removable sling, remove it as told by your health care provider. ? Put ice in a plastic bag. ? Place a towel between your skin and the bag. ? Leave the ice on for 20 minutes, 2-3 times a day.  Move your fingers often to avoid stiffness and to lessen swelling.  Raise (elevate) the injured area above the level of your heart while you are lying down.  Find a comfortable sleeping position or sleep on a recliner, if available. Driving  Do not drive or use heavy machinery while taking prescription pain medicine.  Ask your health care provider when it is safe to drive if you have a sling on your arm. Activity  Rest your  shoulder as told by your health care provider.  Return to your normal activities as told by your health care provider. Ask your health care provider what activities are safe for you.  Do any exercises or stretches as told by your health care provider.  If you do repetitive overhead tasks, take small breaks in between and include stretching exercises as told by your health care provider. General instructions  Do not use any products that contain nicotine or tobacco, such as  cigarettes and e-cigarettes. These can delay healing. If you need help quitting, ask your health care provider.  Take over-the-counter and prescription medicines only as told by your health care provider.  Keep all follow-up visits as told by your health care provider. This is important. Contact a health care provider if:  Your pain gets worse.  You have new pain in your arm, hands, or fingers.  Your pain is not relieved with medicine or does not get better after 6 weeks of treatment.  You have cracking sensations when moving your shoulder in certain directions.  You hear a snapping sound after using your shoulder, followed by severe pain and weakness. Get help right away if:  Your arm, hand, or fingers are numb or tingling.  Your arm, hand, or fingers are swollen or painful or they turn white or blue. Summary  Rotator cuff tendinitis is inflammation of the tough, cord-like bands that connect muscle to bone (tendons) in the rotator cuff.  This condition is usually caused by overusing the rotator cuff, which includes all of the muscles and tendons that connect the arm to the shoulder.  This condition is more likely to develop in athletes and workers who frequently use their shoulder or reach over their heads.  Treatment generally includes rest, anti-inflammatory medicines, and icing. In some cases, physical therapy and steroid injections may be needed. In severe cases, surgery may be needed. This information is not intended to replace advice given to you by your health care provider. Make sure you discuss any questions you have with your health care provider. Document Released: 05/21/2003 Document Revised: 02/15/2016 Document Reviewed: 02/15/2016 Elsevier Interactive Patient Education  2019 [employer].

## 2018-05-31 ENCOUNTER — Ambulatory Visit: Payer: Self-pay | Admitting: Rehabilitative and Restorative Service Providers"

## 2018-06-02 ENCOUNTER — Telehealth: Payer: 59 | Admitting: Family

## 2018-06-02 DIAGNOSIS — R0602 Shortness of breath: Secondary | ICD-10-CM

## 2018-06-02 MED ORDER — ALBUTEROL SULFATE HFA 108 (90 BASE) MCG/ACT IN AERS: 2.0000 | INHALATION_SPRAY | Freq: Four times a day (QID) | RESPIRATORY_TRACT | 0 refills | Status: DC | PRN

## 2018-06-02 MED ORDER — BENZONATATE 200 MG PO CAPS: 200.0000 mg | ORAL_CAPSULE | Freq: Three times a day (TID) | ORAL | 0 refills | Status: DC | PRN

## 2018-06-02 NOTE — Progress Notes (Signed)
Thank you for the details you included in the comment boxes. Those details are very helpful in determining the best course of treatment for you and help [place] to provide the best care.  E-Visit for Corona Virus Screening  Based on your current symptoms, it seems unlikely that your symptoms are related to the Coronavirus.   Coronavirus disease 2019 (COVID-19) is a respiratory illness that can spread from person to person. The virus that causes COVID-19 is a new virus that was first identified in the country of [place] but is now found in multiple other countries and has spread to the [place].  Symptoms associated with the virus are mild to severe fever, cough, and shortness of breath. There is currently no vaccine to protect against COVID-19, and there is no specific antiviral treatment for the virus.   To be considered HIGH RISK for Coronavirus (COVID-19), you have to meet the following criteria:  . Traveled to [place], [place], [place], [place] or [place]; or in the [place] to [place], [place], [place], or [place]; and have fever, cough, and shortness of breath within the last 2 weeks of travel OR  . Been in close contact with a person diagnosed with COVID-19 within the last 2 weeks and have fever, cough, and shortness of breath  . IF YOU DO NOT MEET THESE CRITERIA, YOU ARE CONSIDERED LOW RISK FOR COVID-19.   It is vitally important that if you feel that you have an infection such as this virus or any other virus that you stay home and away from places where you may spread it to others.  You should self-quarantine for 14 days if you have symptoms that could potentially be coronavirus and avoid contact with people age 18 and older.   You can use medication such as A prescription cough medication called Tessalon Perles 100 mg. You may take 1-2 capsules every 8 hours as needed for cough and A prescription inhaler called Albuterol MDI 90 mcg /actuation 2 puffs every 4 hours as needed for  shortness of breath, wheezing, cough  You may also take acetaminophen (Tylenol) as needed for fever.   Reduce your risk of any infection by using the same precautions used for avoiding the common cold or flu:  [name] Wash your hands often with soap and warm water for at least 20 seconds.  If soap and water are not readily available, use an alcohol-based hand sanitizer with at least 60% alcohol.  . If coughing or sneezing, cover your mouth and nose by coughing or sneezing into the elbow areas of your shirt or coat, into a tissue or into your sleeve (not your hands). . Avoid shaking hands with others and consider head nods or verbal greetings only. . Avoid touching your eyes, nose, or mouth with unwashed hands.  . Avoid close contact with people who are sick. . Avoid places or events with large numbers of people in one location, like concerts or sporting events. . Carefully consider travel plans you have or are making. . If you are planning any travel outside or inside the [place], visit the CDC's Travelers' Health webpage for the latest health notices. . If you have some symptoms but not all symptoms, continue to monitor at home and seek medical attention if your symptoms worsen. . If you are having a medical emergency, call 911.  HOME CARE . Only take medications as instructed by your medical team. . Drink plenty of fluids and get plenty of rest. .  A steam or ultrasonic humidifier can help if you have congestion.   GET HELP RIGHT AWAY IF: . You develop worsening fever. . You become short of breath . You cough up blood. . Your symptoms become more severe MAKE SURE YOU   Understand these instructions.  Will watch your condition.  Will get help right away if you are not doing well or get worse.  Your e-visit answers were reviewed by a board certified advanced clinical practitioner to complete your personal care plan.  Depending on the condition, your plan could have included both over the  counter or prescription medications.  If there is a problem please reply once you have received a response from your provider. Your safety is important to [place].  If you have drug allergies check your prescription carefully.    You can use MyChart to ask questions about today's visit, request a non-urgent call back, or ask for a work or school excuse for 24 hours related to this e-Visit. If it has been greater than 24 hours you will need to follow up with your provider, or enter a new e-Visit to address those concerns. You will get an e-mail in the next two days asking about your experience.  I hope that your e-visit has been valuable and will speed your recovery. Thank you for using e-visits.

## 2018-07-02 ENCOUNTER — Ambulatory Visit (INDEPENDENT_AMBULATORY_CARE_PROVIDER_SITE_OTHER): Payer: 59 | Admitting: Family Medicine

## 2018-07-02 ENCOUNTER — Other Ambulatory Visit: Payer: Self-pay

## 2018-07-02 ENCOUNTER — Ambulatory Visit (INDEPENDENT_AMBULATORY_CARE_PROVIDER_SITE_OTHER): Payer: 59

## 2018-07-02 ENCOUNTER — Encounter: Payer: Self-pay | Admitting: Family Medicine

## 2018-07-02 VITALS — BP 153/68 | HR 65 | Temp 98.1°F | Wt 207.0 lb

## 2018-07-02 DIAGNOSIS — M25511 Pain in right shoulder: Secondary | ICD-10-CM

## 2018-07-02 NOTE — Progress Notes (Signed)
Sarah Nicholson is a 48 y.o. female who presents to [facility] today for right shoulder pain.  Sarah Nicholson was seen for right shoulder pain on March 5.  At that point she had been having pain for about 2 months.  She was thought to have rotator cuff tendinopathy and had home exercise program and diclofenac gel.  She notes that she has had improvement in pain.  She rates that she is about 50% better.  She continues to experience pain with activity but overall is much improved.  She denies any radiating pain weakness or numbness fevers or chills.  She denies any injury or numbness distally.  She works as a Dance movement psychotherapist for [employer].   ROS:  As above  Exam:  BP (!) 153/68   Pulse 65   Temp 98.1 F (36.7 C) (Oral)   Wt 207 lb (93.9 kg)   BMI 33.41 kg/m  Wt Readings from Last 5 Encounters:  07/02/18 207 lb (93.9 kg)  05/17/18 208 lb 12.8 oz (94.7 kg)  03/19/18 204 lb 8 oz (92.8 kg)  03/11/18 200 lb (90.7 kg)  03/05/18 205 lb 14.4 oz (93.4 kg)   General: Well Developed, well nourished, and in no acute distress.  Neuro/Psych: Alert and oriented x3, extra-ocular muscles intact, able to move all 4 extremities, sensation grossly intact. Skin: Warm and dry, no rashes noted.  Respiratory: Not using accessory muscles, speaking in full sentences, trachea midline.  Cardiovascular: Pulses palpable, no extremity edema. Abdomen: Does not appear distended. MSK:  Right shoulder normal-appearing Tender palpation AC joint. Normal range of motion. Intact strength. Positive empty can test. Mildly positive Hawkins and Neer's test. Positive crossover arm compression test. Negative Yergason's and speeds test.  Contralateral left shoulder normal-appearing nontender normal motion normal strength.  Pulses cap refill and sensation are intact bilateral upper extremities.    Lab and Radiology Results No results found for this or any previous visit (from  the past 72 hour(s)). Dg Shoulder Right  Result Date: 07/02/2018 CLINICAL DATA:  Pain EXAM: RIGHT SHOULDER - 2+ VIEW COMPARISON:  None. FINDINGS: Oblique, Y scapular, and axillary images were obtained. There is no acute fracture or dislocation. There is moderate narrowing of the acromioclavicular joint. The glenohumeral joint appears normal. No erosive change or intra-articular calcification. Visualized right lung clear. 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 No fracture or dislocation. Electronically Signed   By: Lowella Grip III M.D.   On: 07/02/2018 15:06   I personally (independently) visualized and performed the interpretation of the images attached in this note.     Assessment and Plan: 48 y.o. female with right shoulder pain.  Likely due to rotator cuff tendinopathy/impingement bursitis as well as AC DJD.  Plan for continued home exercise program.  At this point it would be reasonable to consider steroid injection however would like to minimize risk of steroids during COVID-19 if possible.  Additionally physical therapy would also be reasonable at this point however would like to reduce risk as well to physical therapy exposure.  If not improving next up would be injection versus physical therapy.  Patient will keep me updated and will check back again in about 6 weeks.  I spent 15 minutes with this patient, greater than 50% was face-to-face time counseling regarding differential diagnosis treatment plan and options.[name]  PDMP not reviewed this encounter. Orders Placed This Encounter  Procedures  . DG Shoulder Right    Standing Status:   Future  Number of Occurrences:   1    Standing Expiration Date:   09/01/2019    Order Specific Question:   Reason for Exam (SYMPTOM  OR DIAGNOSIS REQUIRED)    Answer:   eval shulder pain rt    Order Specific Question:   Is patient pregnant?    Answer:   No    Order Specific Question:   Preferred imaging  location?    Answer:   [facility]    Order Specific Question:   Radiology Contrast Protocol - do NOT remove file path    Answer:   [email or web address]   No orders of the defined types were placed in this encounter.   Historical information moved to improve visibility of documentation.  Past Medical History:  Diagnosis Date  . ADD (attention deficit disorder) 06/14/2012  . Bronchitis   . Depression 06/14/2012  . History of leukocytosis 06/14/2012   Reports "WBC 32" decades ago with negative hematology specialist workup.   [name] Hyperlipidemia 06/14/2012  . Prediabetes 06/14/2012  . Seasonal allergies 06/14/2012   Past Surgical History:  Procedure Laterality Date  . APPENDECTOMY    . bladder sling/mesh    . INGUINAL HERNIA REPAIR    . intrauterine ablation    . NASAL SINUS SURGERY    . TUBAL LIGATION     Social History   Tobacco Use  . Smoking status: Current Every Day Smoker    Types: Cigarettes  . Smokeless tobacco: Never Used  Substance Use Topics  . Alcohol use: Yes    Comment: 1-2 drinks every 6 months   family history includes Breast cancer in her maternal grandmother; Depression in her mother, sister, and son; Diabetes in her father; Hyperlipidemia in her father; Hypertension in her father.  Medications: Current Outpatient Medications  Medication Sig Dispense Refill  . clonazePAM (KLONOPIN) 0.5 MG tablet Take 1 tablet (0.5 mg total) by mouth 2 (two) times daily as needed for anxiety. 15 tablet 1  . diclofenac sodium (VOLTAREN) 1 % GEL Apply 2 g topically 4 (four) times daily. To affected joint. 100 g 11  . escitalopram (LEXAPRO) 20 MG tablet TAKE 1 TABLET(20 MG) BY MOUTH DAILY 90 tablet 1  . naproxen (NAPROSYN) 500 MG tablet Take 1 tablet (500 mg total) by mouth 2 (two) times daily with a meal. Daily for one week then as needed after that 60 tablet 0  . albuterol (PROVENTIL HFA;VENTOLIN HFA) 108 (90 Base) MCG/ACT inhaler Inhale 2  puffs into the lungs every 6 (six) hours as needed for shortness of breath. (Patient not taking: Reported on 07/02/2018) 1 Inhaler 0  . amphetamine-dextroamphetamine (ADDERALL) 10 MG tablet Take 1 tablet (10 mg total) by mouth 2 (two) times daily. 60 tablet 0  . benzonatate (TESSALON) 200 MG capsule Take 1 capsule (200 mg total) by mouth every 8 (eight) hours as needed for cough. (Patient not taking: Reported on 07/02/2018) 30 capsule 0  . oxyCODONE-acetaminophen (PERCOCET) 5-325 MG tablet Take 1-2 tablets by mouth every 6 (six) hours as needed for severe pain. (Patient not taking: Reported on 05/17/2018) 10 tablet 0   No current facility-administered medications for this visit.    Allergies  Allergen Reactions  . Nitrofurantoin Hives  . Other Hives    Preservative used in vaccinations  . Tetanus Toxoids Swelling    Pt states she was told the Tdap injection would have to be administered in the hospital  After discussion on 11/10 pt remembers being told it was thimersol in the  tetanus 30+years ago that caused the swelling.    Santiago Bur [Nitrofurantoin Macrocrystal] Hives      Discussed warning signs or symptoms. Please see discharge instructions. Patient expresses understanding.

## 2018-07-02 NOTE — Patient Instructions (Addendum)
Thank you for coming in today. Continue home exercises.   Continue diclofenac gel.   If not improving next step is injection or physical therapy.   Let me know if you want to do those.   Keep me updated.

## 2018-07-18 ENCOUNTER — Ambulatory Visit (INDEPENDENT_AMBULATORY_CARE_PROVIDER_SITE_OTHER): Payer: 59 | Admitting: Physician Assistant

## 2018-07-18 ENCOUNTER — Telehealth: Payer: Self-pay | Admitting: Physician Assistant

## 2018-07-18 ENCOUNTER — Encounter: Payer: Self-pay | Admitting: Physician Assistant

## 2018-07-18 VITALS — HR 78 | Temp 97.1°F | Ht 66.0 in | Wt 203.0 lb

## 2018-07-18 DIAGNOSIS — Z20828 Contact with and (suspected) exposure to other viral communicable diseases: Secondary | ICD-10-CM | POA: Diagnosis not present

## 2018-07-18 DIAGNOSIS — R058 Other specified cough: Secondary | ICD-10-CM

## 2018-07-18 DIAGNOSIS — Z20822 Contact with and (suspected) exposure to covid-19: Secondary | ICD-10-CM

## 2018-07-18 DIAGNOSIS — Z720 Tobacco use: Secondary | ICD-10-CM | POA: Diagnosis not present

## 2018-07-18 DIAGNOSIS — R05 Cough: Secondary | ICD-10-CM

## 2018-07-18 MED ORDER — HYDROCOD POLST-CPM POLST ER 10-8 MG/5ML PO SUER: 5.0000 mL | Freq: Every day | ORAL | 0 refills | Status: DC

## 2018-07-18 NOTE — Progress Notes (Signed)
Unable to obtain BP reading.   Cough started last night, unable to sleep. Denies any fever, SOB, wheezing. Some chest pain. Not productive cough. Co-worker positive COVID 6 days ago.

## 2018-07-18 NOTE — Telephone Encounter (Signed)
Pt called clinic requesting appointment. 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 She reports last night she started coughing. No fever, no SOB. Does have a sore throat.  Added on for virtual visit today.

## 2018-07-18 NOTE — Progress Notes (Signed)
Virtual Visit via Video Note  I connected with Anarie Bryan-Hendrix on 07/18/18 at  9:50 AM EDT by a video enabled telemedicine application and verified that I am speaking with the correct person using two identifiers.   I discussed the limitations of evaluation and management by telemedicine and the availability of in person appointments. The patient expressed understanding and agreed to proceed.  History of Present Illness: HPI:                                                                Sarah Nicholson is a 48 y.o. female   CC: cough, exposure to COVID-19  Patient reports new onset non-productive cough last night with chest discomfort. Chest pain is precordial and rated as mild, worse with coughing. Cough interferes with sleep. Endorses malaise and sore throat. Denies SOB or fever. She is a smoker.  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 ADD (attention deficit disorder) 06/14/2012  . Bronchitis   . Depression 06/14/2012  . History of leukocytosis 06/14/2012   Reports "WBC 32" decades ago with negative hematology specialist workup.   [name] Hyperlipidemia 06/14/2012  . Prediabetes 06/14/2012  . Seasonal allergies 06/14/2012   Past Surgical History:  Procedure Laterality Date  . APPENDECTOMY    . bladder sling/mesh    . INGUINAL HERNIA REPAIR    . intrauterine ablation    . NASAL SINUS SURGERY    . TUBAL LIGATION     Social History   Tobacco Use  . Smoking status: Current Every Day Smoker    Types: Cigarettes  . Smokeless tobacco: Never Used  Substance Use Topics  . Alcohol use: Yes    Comment: 1-2 drinks every 6 months   family history includes Breast cancer in her maternal grandmother; Depression in her mother, sister, and son; Diabetes in her father; Hyperlipidemia in her father; Hypertension in her father.    ROS: negative except as noted in the HPI  Medications: Current Outpatient Medications  Medication  Sig Dispense Refill  . albuterol (PROVENTIL HFA;VENTOLIN HFA) 108 (90 Base) MCG/ACT inhaler Inhale 2 puffs into the lungs every 6 (six) hours as needed for shortness of breath. 1 Inhaler 0  . benzonatate (TESSALON) 200 MG capsule Take 1 capsule (200 mg total) by mouth every 8 (eight) hours as needed for cough. 30 capsule 0  . clonazePAM (KLONOPIN) 0.5 MG tablet Take 1 tablet (0.5 mg total) by mouth 2 (two) times daily as needed for anxiety. 15 tablet 1  . diclofenac sodium (VOLTAREN) 1 % GEL Apply 2 g topically 4 (four) times daily. To affected joint. 100 g 11  . escitalopram (LEXAPRO) 20 MG tablet TAKE 1 TABLET(20 MG) BY MOUTH DAILY 90 tablet 1  . naproxen (NAPROSYN) 500 MG tablet Take 1 tablet (500 mg total) by mouth 2 (two) times daily with a meal. Daily for one week then as needed after that 60 tablet 0  . amphetamine-dextroamphetamine (ADDERALL) 10 MG tablet Take 1 tablet (10 mg total) by mouth 2 (two) times daily. 60 tablet 0   No current facility-administered medications for this visit.    Allergies  Allergen Reactions  . Nitrofurantoin Hives  . Other Hives    Preservative  used in vaccinations  . Tetanus Toxoids Swelling    Pt states she was told the Tdap injection would have to be administered in the hospital  After discussion on 11/10 pt remembers being told it was thimersol in the tetanus 30+years ago that caused the swelling.    [name] Macrobid [Nitrofurantoin Macrocrystal] Hives       Objective:  Pulse 78   Temp (!) 97.1 F (36.2 C) (Oral)   Ht 5\' 6"  (1.676 m)   Wt 203 lb (92.1 kg)   BMI 32.77 kg/m  Gen:  alert, ill-appearing, not toxic-appearing, no distress, appropriate for age 54: head normocephalic without obvious abnormality, conjunctiva and cornea clear, trachea midline Pulm: Normal work of breathing, normal phonation, speaking in full sentences Neuro: alert and oriented x 3 No results found for this or any previous visit (from the past 72 hour(s)). No results  found.    Assessment and Plan: 48 y.o. female with   .[name] was seen today for cough.  Diagnoses and all orders for this visit:  Nonproductive cough -     chlorpheniramine-HYDROcodone (TUSSIONEX) 10-8 MG/5ML SUER; Take 5 mLs by mouth at bedtime.  Exposure to Covid-19 Virus  Tobacco use   Vital signs reviewed, afebrile, no tachycardia, no evidence of respiratory distress Physical exam limited due to e-visit High clinical suspicion for COVID-19 infection Patient was counseled on isolation/self-monitoring  Counseled on supportive care - use bronchodilator 1-2 puff Q4H prn, Tessalon tid prn and Tussionex QHS Work note provided Follow-up in 2 days   Follow Up Instructions:    I discussed the assessment and treatment plan with the patient. The patient was provided an opportunity to ask questions and all were answered. The patient agreed with the plan and demonstrated an understanding of the instructions.   The patient was advised to call back or seek an in-person evaluation if the symptoms worsen or if the condition fails to improve as anticipated.  I provided approx 15 minutes of non-face-to-face time during this encounter.   Trixie Dredge, [place]

## 2018-07-20 ENCOUNTER — Encounter: Payer: Self-pay | Admitting: Physician Assistant

## 2018-07-20 ENCOUNTER — Ambulatory Visit (INDEPENDENT_AMBULATORY_CARE_PROVIDER_SITE_OTHER): Payer: 59 | Admitting: Physician Assistant

## 2018-07-20 VITALS — HR 51 | Temp 102.0°F

## 2018-07-20 DIAGNOSIS — Z20822 Contact with and (suspected) exposure to covid-19: Secondary | ICD-10-CM

## 2018-07-20 DIAGNOSIS — R509 Fever, unspecified: Secondary | ICD-10-CM

## 2018-07-20 DIAGNOSIS — R05 Cough: Secondary | ICD-10-CM

## 2018-07-20 DIAGNOSIS — Z20828 Contact with and (suspected) exposure to other viral communicable diseases: Secondary | ICD-10-CM

## 2018-07-20 DIAGNOSIS — J989 Respiratory disorder, unspecified: Secondary | ICD-10-CM

## 2018-07-20 DIAGNOSIS — R058 Other specified cough: Secondary | ICD-10-CM

## 2018-07-20 NOTE — Progress Notes (Signed)
Virtual Visit via Video Note  I connected with [name] on 07/20/18 at  9:10 AM EDT by a video enabled telemedicine application and verified that I am speaking with the correct person using two identifiers.   I discussed the limitations of evaluation and management by telemedicine and the availability of in person appointments. The patient expressed understanding and agreed to proceed.  History of Present Illness: HPI:                                                                Sarah Nicholson is a 48 y.o. female   CC: follow-up - cough, exposure to COVID-19   Patient presented 2 days ago with new onset non-productive cough last night with chest discomfort. Chest pain is precordial and rated as mild, worse with coughing. Cough interferes with sleep. Endorses malaise and sore throat. Denies SOB or fever. She is a smoker.  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 She treated with Tylenol. She also reports swollen lymph nodes under her jaw, but denies sore throat or dysphagia. She has been using Albuterol inhaler, Tessalon and Tussionex with moderate relief of cough.  Past Medical History:  Diagnosis Date  . ADD (attention deficit disorder) 06/14/2012  . Bronchitis   . Depression 06/14/2012  . History of leukocytosis 06/14/2012   Reports "WBC 32" decades ago with negative hematology specialist workup.   [name] Hyperlipidemia 06/14/2012  . Prediabetes 06/14/2012  . Seasonal allergies 06/14/2012   Past Surgical History:  Procedure Laterality Date  . APPENDECTOMY    . bladder sling/mesh    . INGUINAL HERNIA REPAIR    . intrauterine ablation    . NASAL SINUS SURGERY    . TUBAL LIGATION     Social History   Tobacco Use  . Smoking status: Current Every Day Smoker    Types: Cigarettes  . Smokeless tobacco: Never Used  Substance Use Topics  . Alcohol use: Yes    Comment: 1-2 drinks every 6  months   family history includes Breast cancer in her maternal grandmother; Depression in her mother, sister, and son; Diabetes in her father; Hyperlipidemia in her father; Hypertension in her father.    ROS: negative except as noted in the HPI  Medications: Current Outpatient Medications  Medication Sig Dispense Refill  . albuterol (PROVENTIL HFA;VENTOLIN HFA) 108 (90 Base) MCG/ACT inhaler Inhale 2 puffs into the lungs every 6 (six) hours as needed for shortness of breath. 1 Inhaler 0  . benzonatate (TESSALON) 200 MG capsule Take 1 capsule (200 mg total) by mouth every 8 (eight) hours as needed for cough. 30 capsule 0  . chlorpheniramine-HYDROcodone (TUSSIONEX) 10-8 MG/5ML SUER Take 5 mLs by mouth at bedtime. 70 mL 0  . clonazePAM (KLONOPIN) 0.5 MG tablet Take 1 tablet (0.5 mg total) by mouth 2 (two) times daily as needed for anxiety. 15 tablet 1  . diclofenac sodium (VOLTAREN) 1 % GEL Apply 2 g topically 4 (four) times daily. To affected joint. 100 g 11  . escitalopram (LEXAPRO) 20 MG tablet TAKE 1 TABLET(20 MG) BY MOUTH DAILY 90 tablet 1  . naproxen (NAPROSYN) 500 MG tablet Take 1 tablet (500 mg total)  by mouth 2 (two) times daily with a meal. Daily for one week then as needed after that 60 tablet 0  . amphetamine-dextroamphetamine (ADDERALL) 10 MG tablet Take 1 tablet (10 mg total) by mouth 2 (two) times daily. 60 tablet 0   No current facility-administered medications for this visit.    Allergies  Allergen Reactions  . Nitrofurantoin Hives  . Other Hives    Preservative used in vaccinations  . Tetanus Toxoids Swelling    Pt states she was told the Tdap injection would have to be administered in the hospital  After discussion on 11/10 pt remembers being told it was thimersol in the tetanus 30+years ago that caused the swelling.    [name] Macrobid [Nitrofurantoin Macrocrystal] Hives       Objective:  Pulse (!) 51   Temp (!) 102 F (38.9 C) (Oral)  Gen:  alert, ill-appearing,  not toxic-appearing, no distress, appropriate for age 62: head normocephalic without obvious abnormality, conjunctiva and cornea clear, trachea midline Pulm: Normal work of breathing, voice is hoarse, speaking in full sentences, dry cough Neuro: alert and oriented x 3  Pulse Readings from Last 3 Encounters:  07/20/18 (!) 51  07/18/18 78  07/02/18 65     No results found for this or any previous visit (from the past 72 hour(s)). No results found.    Assessment and Plan: 48 y.o. female with   .[name] was seen today for follow-up.  Diagnoses and all orders for this visit:  Febrile respiratory illness  Exposure to Covid-19 Virus  Nonproductive cough    Vitals reviewed, she is febrile at 102 and bradycardic at 52 on patient's smartwatch after waking up. She states this is a typical resting heart rate for her in the morning Remains under home monitoring for COVID-19 Continue supportive care ED precautions reviewed   Follow Up Instructions:    I discussed the assessment and treatment plan with the patient. The patient was provided an opportunity to ask questions and all were answered. The patient agreed with the plan and demonstrated an understanding of the instructions.   The patient was advised to call back or seek an in-person evaluation if the symptoms worsen or if the condition fails to improve as anticipated.  I provided 5-10 minutes of non-face-to-face time during this encounter.   Trixie Dredge, [place]

## 2018-07-23 ENCOUNTER — Ambulatory Visit (INDEPENDENT_AMBULATORY_CARE_PROVIDER_SITE_OTHER): Payer: 59 | Admitting: Physician Assistant

## 2018-07-23 ENCOUNTER — Encounter: Payer: Self-pay | Admitting: Physician Assistant

## 2018-07-23 VITALS — HR 61 | Temp 96.7°F

## 2018-07-23 DIAGNOSIS — R0781 Pleurodynia: Secondary | ICD-10-CM | POA: Diagnosis not present

## 2018-07-23 DIAGNOSIS — R0602 Shortness of breath: Secondary | ICD-10-CM | POA: Diagnosis not present

## 2018-07-23 DIAGNOSIS — Z20828 Contact with and (suspected) exposure to other viral communicable diseases: Secondary | ICD-10-CM

## 2018-07-23 DIAGNOSIS — R05 Cough: Secondary | ICD-10-CM | POA: Diagnosis not present

## 2018-07-23 DIAGNOSIS — R058 Other specified cough: Secondary | ICD-10-CM

## 2018-07-23 DIAGNOSIS — Z20822 Contact with and (suspected) exposure to covid-19: Secondary | ICD-10-CM

## 2018-07-23 MED ORDER — BENZONATATE 200 MG PO CAPS: 200.0000 mg | ORAL_CAPSULE | Freq: Three times a day (TID) | ORAL | 0 refills | Status: DC | PRN

## 2018-07-23 MED ORDER — BUDESONIDE-FORMOTEROL FUMARATE 80-4.5 MCG/ACT IN AERO: 2.0000 | INHALATION_SPRAY | Freq: Two times a day (BID) | RESPIRATORY_TRACT | 0 refills | Status: DC

## 2018-07-23 NOTE — Progress Notes (Signed)
Virtual Visit via Video Note  I connected with Sarah Nicholson on 07/23/18 at  9:50 AM EDT by a video enabled telemedicine application and verified that I am speaking with the correct person using two identifiers.   I discussed the limitations of evaluation and management by telemedicine and the availability of in person appointments. The patient expressed understanding and agreed to proceed.  History of Present Illness: HPI:                                                                Sarah Nicholson is a 48 y.o. female   CC: follow-up - cough, exposure to COVID-19   Patient presented 5 days ago with new onset non-productive cough with chest discomfort. Chest pain is precordial and rated as mild, worse with coughing. Cough interferes with sleep. She then developed a fever on day 2. Max temp 102. She is a smoker.  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  Last fever was Saturday evening She is having constant retrosternal chest pain. Pain is sharp, moderate, persistent. It is not worse with activity. She is taking Tylenol for the pain without much relief. She states she is breathing "quicker" than usual.  Past Medical History:  Diagnosis Date  . ADD (attention deficit disorder) 06/14/2012  . Bronchitis   . Depression 06/14/2012  . History of leukocytosis 06/14/2012   Reports "WBC 32" decades ago with negative hematology specialist workup.   [name] Hyperlipidemia 06/14/2012  . Prediabetes 06/14/2012  . Seasonal allergies 06/14/2012   Past Surgical History:  Procedure Laterality Date  . APPENDECTOMY    . bladder sling/mesh    . INGUINAL HERNIA REPAIR    . intrauterine ablation    . NASAL SINUS SURGERY    . TUBAL LIGATION     Social History   Tobacco Use  . Smoking status: Current Every Day Smoker    Types: Cigarettes  . Smokeless tobacco: Never  Used  Substance Use Topics  . Alcohol use: Yes    Comment: 1-2 drinks every 6 months   family history includes Breast cancer in her maternal grandmother; Depression in her mother, sister, and son; Diabetes in her father; Hyperlipidemia in her father; Hypertension in her father.    ROS: negative except as noted in the HPI  Medications: Current Outpatient Medications  Medication Sig Dispense Refill  . albuterol (PROVENTIL HFA;VENTOLIN HFA) 108 (90 Base) MCG/ACT inhaler Inhale 2 puffs into the lungs every 6 (six) hours as needed for shortness of breath. 1 Inhaler 0  . benzonatate (TESSALON) 200 MG capsule Take 1 capsule (200 mg total) by mouth every 8 (eight) hours as needed for cough. 30 capsule 0  . chlorpheniramine-HYDROcodone (TUSSIONEX) 10-8 MG/5ML SUER Take 5 mLs by mouth at bedtime. 70 mL 0  . clonazePAM (KLONOPIN) 0.5 MG tablet Take 1 tablet (0.5 mg total) by mouth 2 (two) times daily as needed for anxiety. 15 tablet 1  . diclofenac sodium (VOLTAREN) 1 % GEL Apply 2 g topically 4 (four) times daily. To affected joint. 100 g 11  . escitalopram (LEXAPRO) 20 MG tablet TAKE 1 TABLET(20 MG) BY MOUTH DAILY 90 tablet  1  . naproxen (NAPROSYN) 500 MG tablet Take 1 tablet (500 mg total) by mouth 2 (two) times daily with a meal. Daily for one week then as needed after that 60 tablet 0  . amphetamine-dextroamphetamine (ADDERALL) 10 MG tablet Take 1 tablet (10 mg total) by mouth 2 (two) times daily. 60 tablet 0  . budesonide-formoterol (SYMBICORT) 80-4.5 MCG/ACT inhaler Inhale 2 puffs into the lungs 2 (two) times daily. 1 Inhaler 0   No current facility-administered medications for this visit.    Allergies  Allergen Reactions  . Nitrofurantoin Hives  . Other Hives    Preservative used in vaccinations  . Tetanus Toxoids Swelling    Pt states she was told the Tdap injection would have to be administered in the hospital  After discussion on 11/10 pt remembers being told it was thimersol in the  tetanus 30+years ago that caused the swelling.    Santiago Bur [Nitrofurantoin Macrocrystal] Hives       Objective:  Pulse 61   Temp (!) 96.7 F (35.9 C) (Oral)  Gen:  alert, ill-appearing, not toxic-appearing, no distress, appropriate for age 8: head normocephalic without obvious abnormality, conjunctiva and cornea clear, wearing glasses, trachea midline Pulm: Normal work of breathing, voice is hoarse, speaking in full sentences, dry cough Neuro: alert and oriented x 3   No results found for this or any previous visit (from the past 72 hour(s)). No results found.    Assessment and Plan: 48 y.o. female with   .[name] was seen today for follow-up.  Diagnoses and all orders for this visit:  Nonproductive cough -     benzonatate (TESSALON) 200 MG capsule; Take 1 capsule (200 mg total) by mouth every 8 (eight) hours as needed for cough.  Shortness of breath -     Discontinue: benzonatate (TESSALON) 200 MG capsule; Take 1 capsule (200 mg total) by mouth every 8 (eight) hours as needed for cough. -     budesonide-formoterol (SYMBICORT) 80-4.5 MCG/ACT inhaler; Inhale 2 puffs into the lungs 2 (two) times daily.  Exposure to Covid-19 Virus  Pleuritic chest pain   Vitals reviewed, no evidence of respiratory distress on limited exam Chest pain likely due to pleurisy. Start NSAID Remains under home monitoring for COVID-19 Continue supportive care Adding Symbicort 2 puffs bid ED precautions reviewed   Follow Up Instructions:    I discussed the assessment and treatment plan with the patient. The patient was provided an opportunity to ask questions and all were answered. The patient agreed with the plan and demonstrated an understanding of the instructions.   The patient was advised to call back or seek an in-person evaluation if the symptoms worsen or if the condition fails to improve as anticipated.  I provided 5-10 minutes of non-face-to-face time during this  encounter.   Trixie Dredge, [place]

## 2018-07-30 ENCOUNTER — Encounter: Payer: Self-pay | Admitting: Physician Assistant

## 2018-08-19 ENCOUNTER — Other Ambulatory Visit: Payer: Self-pay | Admitting: Osteopathic Medicine

## 2018-08-20 NOTE — Telephone Encounter (Signed)
[facility] requesting refills for epinephrine pens. Rx not listed in active med list. Pls advise, thanks.

## 2018-08-23 ENCOUNTER — Other Ambulatory Visit: Payer: Self-pay

## 2018-08-23 ENCOUNTER — Emergency Department (INDEPENDENT_AMBULATORY_CARE_PROVIDER_SITE_OTHER)
Admission: EM | Admit: 2018-08-23 | Discharge: 2018-08-23 | Disposition: A | Discharge status: Home / Self Care | Payer: 59 | Source: Home / Self Care | Attending: Family Medicine | Admitting: Family Medicine

## 2018-08-23 DIAGNOSIS — T63461A Toxic effect of venom of wasps, accidental (unintentional), initial encounter: Secondary | ICD-10-CM

## 2018-08-23 MED ORDER — DOXYCYCLINE HYCLATE 100 MG PO CAPS: 100.0000 mg | ORAL_CAPSULE | Freq: Two times a day (BID) | ORAL | 0 refills | Status: DC

## 2018-08-23 NOTE — ED Triage Notes (Signed)
2 days ago, patient wa stung by a wasp 3 times...once on the left lower abd, and 2 times center chest.  2 on chest are about the size of a dime, 1 on abdomen is about 6"x6"

## 2018-08-23 NOTE — ED Provider Notes (Signed)
[name]    CSN: [id number] Arrival date & time: 08/23/18  1419     History   Chief Complaint Chief Complaint  Patient presents with   Insect Bite    HPI Sarah Nicholson is a 48 y.o. female.   Patient reports that a wasp flew under her shirt two days ago, stinging her 3 times.  She denies shortness of breath, wheezing, difficulty swallowing, hives, etc.  However, the sting sites have become increasingly red, painful, and larger.  The history is provided by the patient.    Past Medical History:  Diagnosis Date   ADD (attention deficit disorder) 06/14/2012   Bronchitis    Depression 06/14/2012   History of leukocytosis 06/14/2012   Reports "WBC 32" decades ago with negative hematology specialist workup.    Hyperlipidemia 06/14/2012   Prediabetes 06/14/2012   Seasonal allergies 06/14/2012    Patient Active Problem List   Diagnosis Date Noted   Tobacco use 07/18/2018   Sun-induced skin changes, keratosis 08/08/2017   Subcutaneous cyst 08/08/2017   Irritable bowel syndrome (IBS) 05/04/2017   Generalized anxiety disorder 03/16/2016   Social anxiety disorder 03/16/2016   Major depressive disorder, recurrent episode, moderate ([place]) 03/16/2016   Right inguinal hernia 07/14/2015   Hallux valgus 12/09/2014   Acute headache 07/31/2014   Chest pain 07/31/2014   Hypotension 07/31/2014   Other and unspecified ovarian cyst 06/25/2013   Left tennis elbow 06/25/2013   Left breast mass 06/27/2012   Microscopic hematuria 06/27/2012   Hyperlipidemia 06/14/2012   Prediabetes 06/14/2012   History of leukocytosis 06/14/2012   Seasonal allergies 06/14/2012   Depression 06/14/2012   ADD (attention deficit disorder) 06/14/2012    Past Surgical History:  Procedure Laterality Date   APPENDECTOMY     bladder sling/mesh     INGUINAL HERNIA REPAIR     intrauterine ablation     NASAL SINUS SURGERY     TUBAL LIGATION      OB History     No obstetric history on file.      Home Medications    Prior to Admission medications   Medication Sig Start Date End Date Taking? Authorizing Provider  albuterol (PROVENTIL HFA;VENTOLIN HFA) 108 (90 Base) MCG/ACT inhaler Inhale 2 puffs into the lungs every 6 (six) hours as needed for shortness of breath. 06/02/18   [place], Elyse Jarvis, FNP  amphetamine-dextroamphetamine (ADDERALL) 10 MG tablet Take 1 tablet (10 mg total) by mouth 2 (two) times daily. 01/04/18 02/03/18  Emeterio Reeve, DO  benzonatate (TESSALON) 200 MG capsule Take 1 capsule (200 mg total) by mouth every 8 (eight) hours as needed for cough. 07/23/18   Trixie Dredge, PA-C  budesonide-formoterol [facility]) 80-4.5 MCG/ACT inhaler Inhale 2 puffs into the lungs 2 (two) times daily. 07/23/18   Trixie Dredge, PA-C  chlorpheniramine-HYDROcodone (TUSSIONEX) 10-8 MG/5ML SUER Take 5 mLs by mouth at bedtime. 07/18/18   Trixie Dredge, PA-C  clonazePAM (KLONOPIN) 0.5 MG tablet Take 1 tablet (0.5 mg total) by mouth 2 (two) times daily as needed for anxiety. 01/04/18   Emeterio Reeve, DO  diclofenac sodium (VOLTAREN) 1 % GEL Apply 2 g topically 4 (four) times daily. To affected joint. 05/17/18   Gregor Hams, MD  doxycycline (VIBRAMYCIN) 100 MG capsule Take 1 capsule (100 mg total) by mouth 2 (two) times daily. Take with food. 08/23/18   Kandra Nicolas, MD  EPINEPHRINE 0.3 mg/0.3 mL IJ SOAJ injection INJECT INTRAMUSCULARLY AS DIRECTED 08/20/18  Emeterio Reeve, DO  escitalopram (LEXAPRO) 20 MG tablet TAKE 1 TABLET(20 MG) BY MOUTH DAILY 04/25/18   Emeterio Reeve, DO  naproxen (NAPROSYN) 500 MG tablet Take 1 tablet (500 mg total) by mouth 2 (two) times daily with a meal. Daily for one week then as needed after that 03/19/18 03/19/19  Emeterio Reeve, DO    Family History Family History  Problem Relation Age of Onset   Depression Mother    Diabetes Father    Hyperlipidemia Father     Hypertension Father    Depression Sister    Depression Son    Breast cancer Maternal Grandmother     Social History Social History   Tobacco Use   Smoking status: Current Every Day Smoker    Types: Cigarettes   Smokeless tobacco: Never Used  Substance Use Topics   Alcohol use: Yes    Comment: 1-2 drinks every 6 months   Drug use: No     Allergies   Nitrofurantoin, Other, Tetanus toxoids, and Macrobid [nitrofurantoin macrocrystal]   Review of Systems Review of Systems  Constitutional: Negative for appetite change, chills, diaphoresis, fatigue and fever.  HENT: Negative.   Eyes: Negative.   Respiratory: Negative for cough, chest tightness, shortness of breath, wheezing and stridor.   Cardiovascular: Negative.   Gastrointestinal: Negative.   Genitourinary: Negative.   Musculoskeletal: Negative.   Skin: Positive for color change and rash.  Neurological: Negative for headaches.     Physical Exam Triage Vital Signs ED Triage Vitals  Enc Vitals Group     BP 08/23/18 1440 124/82     Pulse Rate 08/23/18 1440 84     Resp 08/23/18 1440 20     Temp 08/23/18 1440 98.4 F (36.9 C)     Temp Source 08/23/18 1440 Oral     SpO2 08/23/18 1440 95 %     Weight 08/23/18 1441 211 lb (95.7 kg)     Height 08/23/18 1441 5\' 6"  (1.676 m)     Head Circumference --      Peak Flow --      Pain Score 08/23/18 1441 5     Pain Loc --      Pain Edu? --      Excl. in [place]? --    No data found.  Updated Vital Signs BP 124/82 (BP Location: Right Arm)    Pulse 84    Temp 98.4 F (36.9 C) (Oral)    Resp 20    Ht 5\' 6"  (1.676 m)    Wt 95.7 kg    SpO2 95%    BMI 34.06 kg/m   Visual Acuity Right Eye Distance:   Left Eye Distance:   Bilateral Distance:    Right Eye Near:   Left Eye Near:    Bilateral Near:     Physical Exam Vitals signs and nursing note reviewed.  Constitutional:      General: She is not in acute distress.    Appearance: She is obese.  HENT:     Head:  Normocephalic.     Mouth/Throat:     Mouth: Mucous membranes are moist.  Eyes:     Pupils: Pupils are equal, round, and reactive to light.  Neck:     Musculoskeletal: Neck supple.  Cardiovascular:     Rate and Rhythm: Normal rate.  Pulmonary:     Effort: Pulmonary effort is normal.  Abdominal:       Comments: Left lower abdomen has 10cm diameter area of macular  erythema, tender to palpation without induration or fluctuance.  Upper abdomen has two smaller, similar lesions about 1.5cm diameter each.  Musculoskeletal:     Right lower leg: No edema.     Left lower leg: No edema.  Skin:    General: Skin is warm and dry.  Neurological:     Mental Status: She is alert.      UC Treatments / Results  Labs (all labs ordered are listed, but only abnormal results are displayed) Labs Reviewed - No data to display  EKG None  Radiology No results found.  Procedures Procedures (including critical care time)  Medications Ordered in UC Medications - No data to display  Initial Impression / Assessment and Plan / UC Course  I have reviewed the triage vital signs and the nursing notes.  Pertinent labs & imaging results that were available during my care of the patient were reviewed by me and considered in my medical decision making (see chart for details).    Suspect staph cellulitis.   Begin doxycycline 100mg  BID. Followup with Family Doctor if not improved in one week.    Final Clinical Impressions(s) / UC Diagnoses   Final diagnoses:  Wasp sting, accidental or unintentional, initial encounter     Discharge Instructions     May take Benadryl for itching.  May apply 1% hydrocortisone cream 2 or 3 times daily.    ED Prescriptions    Medication Sig Dispense Auth. Provider   doxycycline (VIBRAMYCIN) 100 MG capsule Take 1 capsule (100 mg total) by mouth 2 (two) times daily. Take with food. 20 capsule Kandra Nicolas, MD         Kandra Nicolas, MD 08/23/18 [phone]

## 2018-08-23 NOTE — Discharge Instructions (Addendum)
May take Benadryl for itching.  May apply 1% hydrocortisone cream 2 or 3 times daily.

## 2018-09-04 ENCOUNTER — Ambulatory Visit: Payer: 59 | Admitting: Family Medicine

## 2018-09-04 ENCOUNTER — Ambulatory Visit (INDEPENDENT_AMBULATORY_CARE_PROVIDER_SITE_OTHER): Payer: 59 | Admitting: Family Medicine

## 2018-09-04 ENCOUNTER — Ambulatory Visit (INDEPENDENT_AMBULATORY_CARE_PROVIDER_SITE_OTHER): Payer: 59

## 2018-09-04 ENCOUNTER — Encounter: Payer: Self-pay | Admitting: Family Medicine

## 2018-09-04 ENCOUNTER — Other Ambulatory Visit: Payer: Self-pay

## 2018-09-04 VITALS — BP 138/72 | HR 78 | Temp 98.2°F | Wt 209.0 lb

## 2018-09-04 DIAGNOSIS — M25561 Pain in right knee: Secondary | ICD-10-CM | POA: Diagnosis not present

## 2018-09-04 NOTE — Patient Instructions (Signed)
Thank you for coming in today. I think you have patellar tendonitis.  You may also have a meniscus injury. Apply the diclofenac gel to the knee 4x daily.  Ok to use ice.  Work on knee extension exercises with an ankle weight.  Also ok to use straight leg rases in bed.   If not improving ok to proceed with injection.     Patellar Tendinitis Rehab Ask your health care provider which exercises are safe for you. Do exercises exactly as told by your health care provider and adjust them as directed. It is normal to feel mild stretching, pulling, tightness, or discomfort as you do these exercises, but you should stop right away if you feel sudden pain or your pain gets worse.Do not begin these exercises until told by your health care provider. Stretching and range of motion exercises This exercise warms up your muscles and joints and improves the movement and flexibility of your knee. This exercise also helps to relieve pain and stiffness. Exercise A: Hamstring, doorway  1. Lie on your back in front of a doorway with your __________ leg resting against the wall and your other leg flat on the floor in the doorway. There should be a slight bend in your __________ knee. 2. Straighten your __________ knee. You should feel a stretch behind your knee or thigh. If you do not, scoot your buttocks closer to the door. 3. Hold this position for __________ seconds. Repeat __________ times. Complete this stretch __________ times a day. Strengthening exercises These exercises build strength and endurance in your knee. Endurance is the ability to use your muscles for a long time, even after they get tired. Exercise B: Quadriceps, isometric  1. Lie on your back with your __________ leg extended and your other knee bent. 2. Slowly tense the muscles in the front of your __________ thigh. When you do this, you should see your kneecap slide up toward your hip or see increased dimpling just above the knee. This  motion will push the back of your knee toward the floor. If this is painful, try putting a rolled-up hand towel under your knee to support it in a bent position. Change the size of the towel to find a position that allows you to do this exercise without any pain. 3. For __________ seconds, hold the muscle as tight as you can without increasing your pain. 4. Relax the muscles slowly and completely. Repeat __________ times. Complete this exercise __________ times a day. Exercise C: Straight leg raises (quadriceps) 1. Lie on your back with your __________ leg extended and your other knee bent. 2. Tense the muscles in the front of your __________ thigh. When you do this, you should see your kneecap slide up or see increased dimpling just above the knee. 3. Keep these muscles tight as you raise your leg 4-6 inches (10-15 cm) off the floor. Do not let your moving knee bend. 4. Hold this position for __________ seconds. 5. Keep these muscles tense as you slowly lower your leg. 6. Relax your muscles slowly and completely. Repeat __________ times. Complete this exercise __________ times a day. Exercise D: Squats 1. Stand in front of a table, with your feet and knees pointing straight ahead. You may rest your hands on the table for balance but not for support. 2. Slowly bend your knees and lower your hips like you are going to sit in a chair. ? Keep your weight over your heels, not over your toes. ? Keep  your lower legs upright so they are parallel with the table legs. ? Do not let your hips go lower than your knees. ? Do not bend lower than told by your health care provider. ? If your knee pain increases, do not bend as low. 3. Hold the squat position for __________ seconds. 4. Slowly push with your legs to return to standing. Do not use your hands to pull yourself to standing. Repeat __________ times. Complete this exercise __________ times a day. Exercise E: Step-downs 1. Stand on the edge of a  step. 2. Keeping your weight over your __________ heel, slowly bend your __________ knee to bring your __________ heel toward the floor. Lower your heel as far as you can while keeping control and without increasing any discomfort. ? Do not let your __________ knee come forward. ? Use your leg muscles, not gravity, to lower your body. ? Hold a wall or rail for balance if needed. 3. Slowly push through your heel to lift your body weight back up. 4. Return to the starting position. Repeat __________ times. Complete this exercise __________ times a day. Exercise F: Straight leg raises (hip abductors)  1. Lie on your side with your __________ leg in the top position. Lie so your head, shoulder, knee, and hip line up. You may bend your lower knee to help you keep your balance. 2. Roll your hips slightly forward, so that your hips are stacked directly over each other and your __________ knee is facing forward. 3. Leading with your heel, lift your top leg 4-6 inches (10-15 cm). You should feel the muscles in your outer hip lifting. ? Do not let your foot drift forward. ? Do not let your knee roll toward the ceiling. 4. Hold this position for __________ seconds. 5. Slowly lower your leg to the starting position. 6. Let your muscles relax completely after each repetition. Repeat __________ times. Complete this exercise __________ times a day. This information is not intended to replace advice given to you by your health care provider. Make sure you discuss any questions you have with your health care provider. Document Released: 02/28/2005 Document Revised: 11/05/2015 Document Reviewed: 12/02/2014 Elsevier Interactive Patient Education  2019 [employer].

## 2018-09-04 NOTE — Progress Notes (Signed)
Sarah Nicholson is a 48 y.o. female who presents to [facility] today for right knee pain.  Pain is been present for about 3 weeks.  Patient notes anterior knee pain.  She denies any specific injury.  She notes pain is worse with activity including walking.  She notes she is having some limping and occasionally sharp pain in her knee will give way.  She denies locking or catching.  She is tried rest ice elevation and compression and NSAIDs which helped a little.   Additionally patient was seen in urgent care on June 11 for a wasp sting.  Had been stung on the ninth and was worsening with worsening erythema at the site of the sting on her abdomen.  She was thought to have cellulitis with secondary bacterial infection and treated with doxycycline.  Additionally she was recommended for Benadryl and hydrocortisone cream.  In the interim she notes that her symptoms have completely resolved.  ROS:  As above  Exam:  BP 138/72   Pulse 78   Temp 98.2 F (36.8 C) (Oral)   Wt 209 lb (94.8 kg)   BMI 33.73 kg/m  Wt Readings from Last 5 Encounters:  09/04/18 209 lb (94.8 kg)  08/23/18 211 lb (95.7 kg)  07/18/18 203 lb (92.1 kg)  07/02/18 207 lb (93.9 kg)  05/17/18 208 lb 12.8 oz (94.7 kg)   General: Well Developed, well nourished, and in no acute distress.  Neuro/Psych: Alert and oriented x3, extra-ocular muscles intact, able to move all 4 extremities, sensation grossly intact. Skin: Warm and dry, no rashes noted.  Respiratory: Not using accessory muscles, speaking in full sentences, trachea midline.  Cardiovascular: Pulses palpable, no extremity edema. Abdomen: Does not appear distended. MSK:  Right knee: Relatively normal-appearing with mild effusion.  No erythema or deformity. Range of motion 0-120 degrees with crepitations. Tender palpation at inferior lateral patellar pole.  Also mildly tender palpation medial and lateral joint line.  Stable ligamentous exam. Positive medial and lateral McMurray's test. Intact flexion and extension strength.      Lab and Radiology Results X-ray images personally independently reviewed. Right knee: Mild degenerative changes at patellofemoral joint.  No acute fractures or deformity. Await formal radiology over read  Limited musculoskeletal ultrasound right knee reveals intact quadricep tendon with moderate effusion present in the suprapatellar space. Patella tendon is intact however at the origin on the patella she has a slight step-off visible without increase vascular activity.  Additionally she has an area of mild hypoechoic change at the lateral patellar tendon origin at the inferior patellar pole with mild tenderness to palpation with ultrasound probe.  No full thickness of the tendon defect is visible. Medial joint line reveals a normal-appearing meniscus with slight narrow joint space. Lateral joint line reveals normal-appearing lateral meniscus. Impression: Patellar tendinitis:    Assessment and Plan: 48 y.o. female with knee pain.  Likely due to patellar tendinitis at lateral proximal patellar tendon.  Additionally patient does have an effusion which could be coming from meniscus injury exacerbation of DJD.  Plan for diclofenac gel eccentric exercises relative rest.  Recheck if not improving.  Neck step would be injection or physical therapy.  Bee sting: Much improved.  Watchful waiting. PDMP not reviewed this encounter. Orders Placed This Encounter  Procedures  . DG Knee Complete 4 Views Right    Please include patellar sunrise, lateral, and weightbearing bilateral AP and bilateral rosenberg views    Standing Status:  Future    Standing Expiration Date:   11/04/2019    Order Specific Question:   Reason for exam:    Answer:   Please include patellar sunrise, lateral, and weightbearing bilateral AP and bilateral rosenberg views    Comments:   Please include patellar  sunrise, lateral, and weightbearing bilateral AP and bilateral rosenberg views    Order Specific Question:   Preferred imaging location?    Answer:   [facility]  . DG Knee 1-2 Views Left    Standing Status:   Future    Standing Expiration Date:   11/05/2019    Order Specific Question:   Reason for Exam (SYMPTOM  OR DIAGNOSIS REQUIRED)    Answer:   For use with right knee x-ray, bilateral AP and Rosenberg standing.    Order Specific Question:   Is the patient pregnant?    Answer:   No    Order Specific Question:   Preferred imaging location?    Answer:   [facility]   No orders of the defined types were placed in this encounter.   Historical information moved to improve visibility of documentation.  Past Medical History:  Diagnosis Date  . ADD (attention deficit disorder) 06/14/2012  . Bronchitis   . Depression 06/14/2012  . History of leukocytosis 06/14/2012   Reports "WBC 32" decades ago with negative hematology specialist workup.   [name] Hyperlipidemia 06/14/2012  . Prediabetes 06/14/2012  . Seasonal allergies 06/14/2012   Past Surgical History:  Procedure Laterality Date  . APPENDECTOMY    . bladder sling/mesh    . INGUINAL HERNIA REPAIR    . intrauterine ablation    . NASAL SINUS SURGERY    . TUBAL LIGATION     Social History   Tobacco Use  . Smoking status: Current Every Day Smoker    Types: Cigarettes  . Smokeless tobacco: Never Used  Substance Use Topics  . Alcohol use: Yes    Comment: 1-2 drinks every 6 months   family history includes Breast cancer in her maternal grandmother; Depression in her mother, sister, and son; Diabetes in her father; Hyperlipidemia in her father; Hypertension in her father.  Medications: Current Outpatient Medications  Medication Sig Dispense Refill  . diclofenac sodium (VOLTAREN) 1 % GEL Apply 2 g topically 4 (four) times daily. To affected joint. 100 g 11  . EPINEPHRINE 0.3 mg/0.3 mL IJ SOAJ injection INJECT  INTRAMUSCULARLY AS DIRECTED 2 Device 99  . escitalopram (LEXAPRO) 20 MG tablet TAKE 1 TABLET(20 MG) BY MOUTH DAILY 90 tablet 1  . amphetamine-dextroamphetamine (ADDERALL) 10 MG tablet Take 1 tablet (10 mg total) by mouth 2 (two) times daily. 60 tablet 0  . benzonatate (TESSALON) 200 MG capsule Take 1 capsule (200 mg total) by mouth every 8 (eight) hours as needed for cough. 30 capsule 0  . budesonide-formoterol (SYMBICORT) 80-4.5 MCG/ACT inhaler Inhale 2 puffs into the lungs 2 (two) times daily. 1 Inhaler 0  . chlorpheniramine-HYDROcodone (TUSSIONEX) 10-8 MG/5ML SUER Take 5 mLs by mouth at bedtime. 70 mL 0  . clonazePAM (KLONOPIN) 0.5 MG tablet Take 1 tablet (0.5 mg total) by mouth 2 (two) times daily as needed for anxiety. 15 tablet 1  . naproxen (NAPROSYN) 500 MG tablet Take 1 tablet (500 mg total) by mouth 2 (two) times daily with a meal. Daily for one week then as needed after that 60 tablet 0   No current facility-administered medications for this visit.    Allergies  Allergen Reactions  .  Nitrofurantoin Hives  . Other Hives    Preservative used in vaccinations  . Tetanus Toxoids Swelling    Pt states she was told the Tdap injection would have to be administered in the hospital  After discussion on 11/10 pt remembers being told it was thimersol in the tetanus 30+years ago that caused the swelling.    Santiago Bur [Nitrofurantoin Macrocrystal] Hives      Discussed warning signs or symptoms. Please see discharge instructions. Patient expresses understanding.

## 2018-10-18 ENCOUNTER — Telehealth: Payer: Self-pay | Admitting: Family Medicine

## 2018-10-18 NOTE — Telephone Encounter (Signed)
Pt called and wanted to schedule visit for injection. Will forward to schedulers to schedule.

## 2018-10-24 ENCOUNTER — Other Ambulatory Visit: Payer: Self-pay

## 2018-10-24 ENCOUNTER — Encounter: Payer: Self-pay | Admitting: Family Medicine

## 2018-10-24 ENCOUNTER — Ambulatory Visit (INDEPENDENT_AMBULATORY_CARE_PROVIDER_SITE_OTHER): Payer: 59 | Admitting: Family Medicine

## 2018-10-24 VITALS — BP 122/75 | HR 74 | Temp 98.2°F | Wt 210.0 lb

## 2018-10-24 DIAGNOSIS — M25511 Pain in right shoulder: Secondary | ICD-10-CM | POA: Diagnosis not present

## 2018-10-24 NOTE — Patient Instructions (Signed)
Thank you for coming in today. I think the main source of pain was the little AC joint on the top of the shoulder.  Call or go to the ER if you develop a large red swollen joint with extreme pain or oozing puss.  Let me know you do.  The numbing medicine will wear off tonight and the cortisone will start working in 1-3 days.

## 2018-10-24 NOTE — Progress Notes (Signed)
Sarah Nicholson is a 48 y.o. female who presents to [facility] today for shoulder pain.  Patient was seen several times for right shoulder pain.  She had failed conservative management and we plan to proceed with shoulder injection which was delayed due to COVID-19.  She notes her pain is continued and she like to proceed with injection.  She has pain at the superior aspect of her shoulder with crossover and abduction activities.  She is here today for shot and evaluation.    ROS:  As above  Exam:  BP 122/75   Pulse 74   Temp 98.2 F (36.8 C) (Oral)   Wt 210 lb (95.3 kg)   BMI 33.89 kg/m  Wt Readings from Last 5 Encounters:  10/24/18 210 lb (95.3 kg)  09/04/18 209 lb (94.8 kg)  08/23/18 211 lb (95.7 kg)  07/18/18 203 lb (92.1 kg)  07/02/18 207 lb (93.9 kg)   General: Well Developed, well nourished, and in no acute distress.  Neuro/Psych: Alert and oriented x3, extra-ocular muscles intact, able to move all 4 extremities, sensation grossly intact. Skin: Warm and dry, no rashes noted.  Respiratory: Not using accessory muscles, speaking in full sentences, trachea midline.  Cardiovascular: Pulses palpable, no extremity edema. Abdomen: Does not appear distended. MSK: Right shoulder: Normal-appearing.  Tender palpation AC joint.  Full range of motion of her pain with abduction.  Positive Hawkins and Neer's and crossover arm compression test.  Intact strength.  Pulses cap refill and sensation intact distally.    Lab and Radiology Results Procedure: Real-time Ultrasound Guided Injection of right AC joint Device: GE Logiq E   Images permanently stored and available for review in the ultrasound unit. Verbal informed consent obtained.  Discussed risks and benefits of procedure. Warned about infection bleeding damage to structures skin hypopigmentation and fat atrophy among others. Patient expresses understanding and agreement Time-out  conducted.   Noted no overlying erythema, induration, or other signs of local infection.   Skin prepped in a sterile fashion.   Local anesthesia: Topical Ethyl chloride.   With sterile technique and under real time ultrasound guidance:  20 mg of Kenalog and 1 mL of Marcaine of injected easily.   Completed without difficulty   Pain immediately resolved suggesting accurate placement of the medication.   Advised to call if fevers/chills, erythema, induration, drainage, or persistent bleeding.   Images permanently stored and available for review in the ultrasound unit.  Impression: Technically successful ultrasound guided injection.          Assessment and Plan: 48 y.o. female with right shoulder pain.  Considerable improvement following AC joint injection.  Plan for continued home exercise program recheck as needed.  If not improving would consider possible repeat injection or MRI for further characterization and potential surgical planning.   PDMP not reviewed this encounter. No orders of the defined types were placed in this encounter.  No orders of the defined types were placed in this encounter.   Historical information moved to improve visibility of documentation.  Past Medical History:  Diagnosis Date  . ADD (attention deficit disorder) 06/14/2012  . Bronchitis   . Depression 06/14/2012  . History of leukocytosis 06/14/2012   Reports "WBC 32" decades ago with negative hematology specialist workup.   [name] Hyperlipidemia 06/14/2012  . Prediabetes 06/14/2012  . Seasonal allergies 06/14/2012   Past Surgical History:  Procedure Laterality Date  . APPENDECTOMY    . bladder sling/mesh    .  INGUINAL HERNIA REPAIR    . intrauterine ablation    . NASAL SINUS SURGERY    . TUBAL LIGATION     Social History   Tobacco Use  . Smoking status: Current Every Day Smoker    Types: Cigarettes  . Smokeless tobacco: Never Used  Substance Use Topics  . Alcohol use: Yes    Comment: 1-2 drinks  every 6 months   family history includes Breast cancer in her maternal grandmother; Depression in her mother, sister, and son; Diabetes in her father; Hyperlipidemia in her father; Hypertension in her father.  Medications: Current Outpatient Medications  Medication Sig Dispense Refill  . clonazePAM (KLONOPIN) 0.5 MG tablet Take 1 tablet (0.5 mg total) by mouth 2 (two) times daily as needed for anxiety. 15 tablet 1  . diclofenac sodium (VOLTAREN) 1 % GEL Apply 2 g topically 4 (four) times daily. To affected joint. 100 g 11  . EPINEPHRINE 0.3 mg/0.3 mL IJ SOAJ injection INJECT INTRAMUSCULARLY AS DIRECTED 2 Device 99  . escitalopram (LEXAPRO) 20 MG tablet TAKE 1 TABLET(20 MG) BY MOUTH DAILY 90 tablet 1  . amphetamine-dextroamphetamine (ADDERALL) 10 MG tablet Take 1 tablet (10 mg total) by mouth 2 (two) times daily. 60 tablet 0   No current facility-administered medications for this visit.    Allergies  Allergen Reactions  . Nitrofurantoin Hives  . Other Hives    Preservative used in vaccinations  . Tetanus Toxoids Swelling    Pt states she was told the Tdap injection would have to be administered in the hospital  After discussion on 11/10 pt remembers being told it was thimersol in the tetanus 30+years ago that caused the swelling.    Santiago Bur [Nitrofurantoin Macrocrystal] Hives      Discussed warning signs or symptoms. Please see discharge instructions. Patient expresses understanding.

## 2018-10-30 ENCOUNTER — Ambulatory Visit (INDEPENDENT_AMBULATORY_CARE_PROVIDER_SITE_OTHER): Payer: 59 | Admitting: Family Medicine

## 2018-10-30 ENCOUNTER — Encounter: Payer: Self-pay | Admitting: Family Medicine

## 2018-10-30 ENCOUNTER — Other Ambulatory Visit: Payer: Self-pay

## 2018-10-30 VITALS — BP 138/67 | HR 66 | Temp 98.4°F | Wt 209.4 lb

## 2018-10-30 DIAGNOSIS — R102 Pelvic and perineal pain: Secondary | ICD-10-CM

## 2018-10-30 DIAGNOSIS — R109 Unspecified abdominal pain: Secondary | ICD-10-CM

## 2018-10-30 DIAGNOSIS — R319 Hematuria, unspecified: Secondary | ICD-10-CM

## 2018-10-30 LAB — POCT URINALYSIS DIPSTICK
Bilirubin, UA: NEGATIVE
Glucose, UA: NEGATIVE
Ketones, UA: NEGATIVE
Leukocytes, UA: NEGATIVE
Nitrite, UA: NEGATIVE
Protein, UA: POSITIVE — AB
Spec Grav, UA: 1.01 (ref 1.010–1.025)
Urobilinogen, UA: 0.2 E.U./dL
pH, UA: 5.5 (ref 5.0–8.0)

## 2018-10-30 MED ORDER — HYDROCODONE-ACETAMINOPHEN 5-325 MG PO TABS: 1.0000 | ORAL_TABLET | Freq: Four times a day (QID) | ORAL | 0 refills | Status: DC | PRN

## 2018-10-30 MED ORDER — CEFDINIR 300 MG PO CAPS: 300.0000 mg | ORAL_CAPSULE | Freq: Two times a day (BID) | ORAL | 0 refills | Status: DC

## 2018-10-30 NOTE — Progress Notes (Signed)
Sarah Nicholson is a 48 y.o. female who presents to [facility] today for left flank pain.  Patient started experiencing pain in her left low back and left flank radiating to referring to the left lower quadrant of her abdomen starting yesterday.  Symptoms are slowly worsening.  She notes that she has urinary frequency but notes that the pretty normal for her.  She denies any urgency or dysuria.  She notes that she is had urinary tract infections and even pyelonephritis with no frequency urgency or dysuria in the past.  She denies any change in her bowels.  She denies any symptom change with eating.  She notes that motion does not typically worsen her pain.  She is tried some over-the-counter medicine for pain which helped a little.  As noted previously she is had pyelonephritis and notes that her symptoms are somewhat consistent with early episodes upon nephritis.  Additionally she has had kidney stones in the past and notes that her symptoms are somewhat consistent with kidney stones.  She notes that she has chronic idiopathic hematuria and having blood in the urine is pretty normal for her.  She does not have visible blood in her urine however.  She also denies any vaginal discharge.  Her surgical history is significant for appendectomy however she still has her gallbladder uterus ovaries and fallopian tubes.  She has had a endometrial ablation.   She was seen relatively recently on August 12 for shoulder pain.  She received an AC joint injection.  She notes that she is feeling a lot better following with the shoulder injection.    ROS:  As above  Exam:  BP 138/67 (BP Location: Left Arm, Patient Position: Sitting, Cuff Size: Normal)   Pulse 66   Temp 98.4 F (36.9 C) (Oral)   Wt 209 lb 6.4 oz (95 kg)   BMI 33.80 kg/m  Wt Readings from Last 5 Encounters:  10/30/18 209 lb 6.4 oz (95 kg)  10/24/18 210 lb (95.3 kg)  09/04/18 209 lb (94.8 kg)   08/23/18 211 lb (95.7 kg)  07/18/18 203 lb (92.1 kg)    Gen: Well NAD HEENT: EOMI,  MMM Lungs: Normal work of breathing. CTABL Heart: RRR no MRG Abd: NABS, Soft. Nondistended, minimally tender to palpation left upper and lower quadrant with no rebound or guarding.  Tender to percussion CVA angle on the left.  Nontender to spinal midline.  Normal lumbar motion without significant pain. Exts: Brisk capillary refill, warm and well perfused.      Lab and Radiology Results Results for orders placed or performed in visit on 10/30/18 (from the past 72 hour(s))  POCT Urinalysis Dipstick     Status: Abnormal   Collection Time: 10/30/18 11:04 AM  Result Value Ref Range   Color, UA YELLOW    Clarity, UA CLEAR    Glucose, UA Negative Negative   Bilirubin, UA NEGATIVE    Ketones, UA NEGATIVE    Spec Grav, UA 1.010 1.010 - 1.025   Blood, UA LARGE    pH, UA 5.5 5.0 - 8.0   Protein, UA Positive (A) Negative    Comment: TRACE   Urobilinogen, UA 0.2 0.2 or 1.0 E.U./dL   Nitrite, UA NEGATIVE    Leukocytes, UA Negative Negative   Appearance     Odor     No results found.     Assessment and Plan: 48 y.o. female with  Left-sided flank pain.  History  significant for both kidney stones as well as pyelonephritis.  Additionally patient has had hemorrhagic ovarian cysts in the past as well.  Urinalysis is abnormal however history of idiopathic hematuria makes interpretation of urinalysis somewhat difficult.  Plan for micro analysis and culture.  We will go ahead and treat empirically for possible early pyelonephritis with Omnicef.  Additionally patient has had kidney stones.  We will work this up with CBC with differential and metabolic panel.  We will also use renal ultrasound.  This will at least tell us about urinary obstruction.  Would like to avoid repeat CT scans.  Patient has had 2 CT scans of her abdomen and pelvis in the last 2 years.  Symptom control with hydrocodone.  Recheck if not  improving quickly.  Precautions reviewed.  Patient expresses understanding and agreement.   PDMP reviewed during this encounter. Orders Placed This Encounter  Procedures  . Urine Culture  . US Renal    Standing Status:   Future    Standing Expiration Date:   12/30/2019    Order Specific Question:   Reason for Exam (SYMPTOM  OR DIAGNOSIS REQUIRED)    Answer:   eval left flank pain. Possible renal stone. R/o obstruction    Order Specific Question:   Preferred imaging location?    Answer:   [facility]  . CBC with Differential/Platelet  . COMPLETE METABOLIC PANEL WITH GFR  . Urinalysis, microscopic only  . POCT Urinalysis Dipstick   Meds ordered this encounter  Medications  . HYDROcodone-acetaminophen (NORCO/VICODIN) 5-325 MG tablet    Sig: Take 1 tablet by mouth every 6 (six) hours as needed.    Dispense:  15 tablet    Refill:  0  . cefdinir (OMNICEF) 300 MG capsule    Sig: Take 1 capsule (300 mg total) by mouth 2 (two) times daily.    Dispense:  14 capsule    Refill:  0    Historical information moved to improve visibility of documentation.  Past Medical History:  Diagnosis Date  . ADD (attention deficit disorder) 06/14/2012  . Bronchitis   . Depression 06/14/2012  . History of leukocytosis 06/14/2012   Reports "WBC 32" decades ago with negative hematology specialist workup.   [name] Hyperlipidemia 06/14/2012  . Prediabetes 06/14/2012  . Seasonal allergies 06/14/2012   Past Surgical History:  Procedure Laterality Date  . APPENDECTOMY    . bladder sling/mesh    . INGUINAL HERNIA REPAIR    . intrauterine ablation    . NASAL SINUS SURGERY    . TUBAL LIGATION     Social History   Tobacco Use  . Smoking status: Current Every Day Smoker    Types: Cigarettes  . Smokeless tobacco: Never Used  Substance Use Topics  . Alcohol use: Yes    Comment: 1-2 drinks every 6 months   family history includes Breast cancer in her maternal grandmother; Depression in her mother,  sister, and son; Diabetes in her father; Hyperlipidemia in her father; Hypertension in her father.  Medications: Current Outpatient Medications  Medication Sig Dispense Refill  . clonazePAM (KLONOPIN) 0.5 MG tablet Take 1 tablet (0.5 mg total) by mouth 2 (two) times daily as needed for anxiety. 15 tablet 1  . diclofenac sodium (VOLTAREN) 1 % GEL Apply 2 g topically 4 (four) times daily. To affected joint. 100 g 11  . EPINEPHRINE 0.3 mg/0.3 mL IJ SOAJ injection INJECT INTRAMUSCULARLY AS DIRECTED 2 Device 99  . escitalopram (LEXAPRO) 20 MG tablet TAKE 1  TABLET(20 MG) BY MOUTH DAILY 90 tablet 1  . amphetamine-dextroamphetamine (ADDERALL) 10 MG tablet Take 1 tablet (10 mg total) by mouth 2 (two) times daily. 60 tablet 0  . cefdinir (OMNICEF) 300 MG capsule Take 1 capsule (300 mg total) by mouth 2 (two) times daily. 14 capsule 0  . HYDROcodone-acetaminophen (NORCO/VICODIN) 5-325 MG tablet Take 1 tablet by mouth every 6 (six) hours as needed. 15 tablet 0   No current facility-administered medications for this visit.    Allergies  Allergen Reactions  . Nitrofurantoin Hives and Rash  . Other Hives    Preservative used in vaccinations  . Tetanus Toxoids Swelling    Pt states she was told the Tdap injection would have to be administered in the hospital  After discussion on 11/10 pt remembers being told it was thimersol in the tetanus 30+years ago that caused the swelling.    [name] Macrobid [Nitrofurantoin Macrocrystal] Hives  . Thimerosal Rash      Discussed warning signs or symptoms. Please see discharge instructions. Patient expresses understanding.

## 2018-10-30 NOTE — Patient Instructions (Signed)
Thank you for coming in today. I am really sorry this is happening.  I think it is probably a kidney stone.  However will treat for possible infection while waiting on urine studies with omnicef antibiotic twice daily for 1 week.  Get blood work and schedule kidney ultrasound today.  Use hydrocodone sparingly for severe pain.  I do not think this is a muscle issue.   If your belly pain worsens, or you have high fever, bad vomiting, blood in your stool or black tarry stool go to the Emergency Room.   Kidney Stones  Kidney stones (urolithiasis) are rock-like masses that form inside of the kidneys. Kidneys are organs that make pee (urine). A kidney stone can cause very bad pain and can block the flow of pee. The stone usually leaves your body (passes) through your pee. You may need to have a doctor take out the stone. Follow these instructions at home: Eating and drinking  Drink enough fluid to keep your pee clear or pale yellow. This will help you pass the stone.  If told by your doctor, change the foods you eat (your diet). This may include: ? Limiting how much salt (sodium) you eat. ? Eating more fruits and vegetables. ? Limiting how much meat, poultry, fish, and eggs you eat.  Follow instructions from your doctor about eating or drinking restrictions. General instructions  Collect pee samples as told by your doctor. You may need to collect a pee sample: ? 24 hours after a stone comes out. ? 8-12 weeks after a stone comes out, and every 6-12 months after that.  Strain your pee every time you pee (urinate), for as long as told. Use the strainer that your doctor recommends.  Do not throw out the stone. Keep it so that it can be tested by your doctor.  Take over-the-counter and prescription medicines only as told by your doctor.  Keep all follow-up visits as told by your doctor. This is important. You may need follow-up tests. Preventing kidney stones To prevent another kidney  stone:  Drink enough fluid to keep your pee clear or pale yellow. This is the best way to prevent kidney stones.  Eat healthy foods.  Avoid certain foods as told by your doctor. You may be told to eat less protein.  Stay at a healthy weight. Contact a doctor if:  You have pain that gets worse or does not get better with medicine. Get help right away if:  You have a fever or chills.  You get very bad pain.  You get new pain in your belly (abdomen).  You pass out (faint).  You cannot pee. This information is not intended to replace advice given to you by your health care provider. Make sure you discuss any questions you have with your health care provider. Document Released: 08/17/2007 Document Revised: 10/10/2017 Document Reviewed: 11/17/2015 Elsevier Patient Education  [employer].    Pyelonephritis, Adult  Pyelonephritis is an infection that occurs in the kidney. The kidneys are organs that help clean the blood by moving waste out of the blood and into the pee (urine). This infection can happen quickly, or it can last for a long time. In most cases, it clears up with treatment and does not cause other problems. What are the causes? This condition may be caused by:  Germs (bacteria) going from the bladder up to the kidney. This may happen after having a bladder infection.  Germs going from the blood to the  kidney. What increases the risk? This condition is more likely to develop in:  Pregnant women.  Older people.  People who have any of these conditions: ? Diabetes. ? Inflammation of the prostate gland (prostatitis), in males. ? Kidney stones or bladder stones. ? Other problems with the kidney or the parts of your body that carry pee from the kidneys to the bladder (ureters). ? Cancer.  People who have a small, thin tube (catheter) placed in the bladder.  People who are sexually active.  Women who use a medicine that kills sperm (spermicide) to prevent  pregnancy.  People who have had a prior urinary tract infection (UTI). What are the signs or symptoms? Symptoms of this condition include:  Peeing often.  A strong urge to pee right away.  Burning or stinging when peeing.  Belly pain.  Back pain.  Pain in the side (flank area).  Fever or chills.  Blood in the pee, or dark pee.  Feeling sick to your stomach (nauseous) or throwing up (vomiting). How is this treated? This condition may be treated by:  Taking antibiotic medicines by mouth (orally).  Drinking enough fluids. If the infection is bad, you may need to stay in the hospital. You may be given antibiotics and fluids that are put directly into a vein through an IV tube. In some cases, other treatments may be needed. Follow these instructions at home: Medicines  Take your antibiotic medicine as told by your doctor. Do not stop taking the antibiotic even if you start to feel better.  Take over-the-counter and prescription medicines only as told by your doctor. General instructions   Drink enough fluid to keep your pee pale yellow.  Avoid caffeine, tea, and carbonated drinks.  Pee (urinate) often. Avoid holding in pee for long periods of time.  Pee before and after sex.  After pooping (having a bowel movement), women should wipe from front to back. Use each tissue only once.  Keep all follow-up visits as told by your doctor. This is important. Contact a doctor if:  You do not feel better after 2 days.  Your symptoms get worse.  You have a fever. Get help right away if:  You cannot take your medicine or drink fluids as told.  You have chills and shaking.  You throw up.  You have very bad pain in your side or back.  You feel very weak or you pass out (faint). Summary  Pyelonephritis is an infection that occurs in the kidney.  In most cases, this infection clears up with treatment and does not cause other problems.  Take your antibiotic medicine  as told by your doctor. Do not stop taking the antibiotic even if you start to feel better.  Drink enough fluid to keep your pee pale yellow. This information is not intended to replace advice given to you by your health care provider. Make sure you discuss any questions you have with your health care provider. Document Released: 04/07/2004 Document Revised: 01/02/2018 Document Reviewed: 01/02/2018 Elsevier Patient Education  2020 [employer].

## 2018-10-31 ENCOUNTER — Telehealth: Payer: Self-pay | Admitting: Family Medicine

## 2018-10-31 ENCOUNTER — Ambulatory Visit (INDEPENDENT_AMBULATORY_CARE_PROVIDER_SITE_OTHER): Payer: 59

## 2018-10-31 DIAGNOSIS — R109 Unspecified abdominal pain: Secondary | ICD-10-CM

## 2018-10-31 DIAGNOSIS — R319 Hematuria, unspecified: Secondary | ICD-10-CM

## 2018-10-31 DIAGNOSIS — R102 Pelvic and perineal pain: Secondary | ICD-10-CM | POA: Diagnosis not present

## 2018-10-31 DIAGNOSIS — N2889 Other specified disorders of kidney and ureter: Secondary | ICD-10-CM

## 2018-10-31 LAB — CBC WITH DIFFERENTIAL/PLATELET
Absolute Monocytes: 750 {cells}/uL (ref 200–950)
Basophils Absolute: 74 {cells}/uL (ref 0–200)
Basophils Relative: 0.5 %
Eosinophils Absolute: 74 {cells}/uL (ref 15–500)
Eosinophils Relative: 0.5 %
HCT: 42.2 % (ref 35.0–45.0)
Hemoglobin: 14.2 g/dL (ref 11.7–15.5)
Lymphs Abs: 3455 {cells}/uL (ref 850–3900)
MCH: 30.3 pg (ref 27.0–33.0)
MCHC: 33.6 g/dL (ref 32.0–36.0)
MCV: 90.2 fL (ref 80.0–100.0)
MPV: 9.9 fL (ref 7.5–12.5)
Monocytes Relative: 5.1 %
Neutro Abs: 10349 {cells}/uL — ABNORMAL HIGH (ref 1500–7800)
Neutrophils Relative %: 70.4 %
Platelets: 331 10*3/uL (ref 140–400)
RBC: 4.68 Million/uL (ref 3.80–5.10)
RDW: 12.8 % (ref 11.0–15.0)
Total Lymphocyte: 23.5 %
WBC: 14.7 10*3/uL — ABNORMAL HIGH (ref 3.8–10.8)

## 2018-10-31 LAB — COMPLETE METABOLIC PANEL WITH GFR
AG Ratio: 1.5 (calc) (ref 1.0–2.5)
ALT: 10 U/L (ref 6–29)
AST: 10 U/L (ref 10–35)
Albumin: 3.8 g/dL (ref 3.6–5.1)
Alkaline phosphatase (APISO): 63 U/L (ref 31–125)
BUN: 18 mg/dL (ref 7–25)
CO2: 25 mmol/L (ref 20–32)
Calcium: 9.3 mg/dL (ref 8.6–10.2)
Chloride: 104 mmol/L (ref 98–110)
Creat: 0.86 mg/dL (ref 0.50–1.10)
GFR, Est African American: 92 mL/min/{1.73_m2} (ref >=60)
GFR, Est Non African American: 79 mL/min/{1.73_m2} (ref >=60)
Globulin: 2.5 g/dL (calc) (ref 1.9–3.7)
Glucose, Bld: 88 mg/dL (ref 65–99)
Potassium: 4 mmol/L (ref 3.5–5.3)
Sodium: 137 mmol/L (ref 135–146)
Total Bilirubin: 0.4 mg/dL (ref 0.2–1.2)
Total Protein: 6.3 g/dL (ref 6.1–8.1)

## 2018-10-31 NOTE — Telephone Encounter (Signed)
Ultrasound shows a 2 cm solid mass in the right kidney.  This is not seen on previous studies.  Is not obvious what is going on yet but the radiologist recommends MRI to evaluate this.  Plan to proceed with MRI with and without contrast of the right kidney.  You should hear about scheduling this very soon. We will get the results back to you soon as possible.

## 2018-10-31 NOTE — Telephone Encounter (Signed)
Spoke to patient gave her reccomendations as noted below. Rhonda Cunningham,CMA

## 2018-11-01 ENCOUNTER — Other Ambulatory Visit: Payer: Self-pay | Admitting: Osteopathic Medicine

## 2018-11-01 DIAGNOSIS — F32A Depression, unspecified: Secondary | ICD-10-CM

## 2018-11-01 DIAGNOSIS — F329 Major depressive disorder, single episode, unspecified: Secondary | ICD-10-CM

## 2018-11-01 DIAGNOSIS — F411 Generalized anxiety disorder: Secondary | ICD-10-CM

## 2018-11-01 LAB — URINE CULTURE
MICRO NUMBER:: 783719
SPECIMEN QUALITY:: ADEQUATE

## 2018-11-01 LAB — URINALYSIS, MICROSCOPIC ONLY
Bacteria, UA: NONE SEEN /HPF
Hyaline Cast: NONE SEEN /LPF
Squamous Epithelial / LPF: NONE SEEN /HPF (ref <=5)
WBC, UA: NONE SEEN /HPF (ref 0–5)

## 2018-11-05 ENCOUNTER — Telehealth: Payer: Self-pay | Admitting: Family Medicine

## 2018-11-05 NOTE — Telephone Encounter (Signed)
Pt left message stating she has not heard about MRI being scheduled. Referral was placed.Will forward to Rachael to see if waiting on prior authorization.

## 2018-11-06 NOTE — Telephone Encounter (Signed)
This was authorized on 10/31/18 and imaging was advised,   I called imaging and ask that they try to reach back out to patient to schedule

## 2018-11-13 ENCOUNTER — Ambulatory Visit: Payer: 59

## 2018-11-13 ENCOUNTER — Other Ambulatory Visit: Payer: Self-pay

## 2018-11-13 ENCOUNTER — Encounter: Payer: Self-pay | Admitting: Family Medicine

## 2018-11-13 DIAGNOSIS — N2889 Other specified disorders of kidney and ureter: Secondary | ICD-10-CM

## 2018-11-13 DIAGNOSIS — K760 Fatty (change of) liver, not elsewhere classified: Secondary | ICD-10-CM | POA: Insufficient documentation

## 2018-11-13 HISTORY — DX: Fatty (change of) liver, not elsewhere classified: K76.0

## 2018-11-13 MED ORDER — GADOBUTROL 1 MMOL/ML IV SOLN
9.0000 mL | Freq: Once | INTRAVENOUS | Status: AC | PRN
  Administered 2018-11-13: 9 mL via INTRAVENOUS

## 2019-01-06 ENCOUNTER — Emergency Department (INDEPENDENT_AMBULATORY_CARE_PROVIDER_SITE_OTHER)
Admission: EM | Admit: 2019-01-06 | Discharge: 2019-01-06 | Disposition: A | Discharge status: Home / Self Care | Payer: 59 | Source: Home / Self Care | Attending: Family Medicine | Admitting: Family Medicine

## 2019-01-06 ENCOUNTER — Encounter: Payer: Self-pay | Admitting: Emergency Medicine

## 2019-01-06 ENCOUNTER — Other Ambulatory Visit: Payer: Self-pay

## 2019-01-06 DIAGNOSIS — H6692 Otitis media, unspecified, left ear: Secondary | ICD-10-CM

## 2019-01-06 MED ORDER — AMOXICILLIN 875 MG PO TABS: 875.0000 mg | ORAL_TABLET | Freq: Two times a day (BID) | ORAL | 0 refills | Status: DC

## 2019-01-06 MED ORDER — IBUPROFEN 600 MG PO TABS
600.0000 mg | ORAL_TABLET | Freq: Once | ORAL | Status: AC
  Administered 2019-01-06: 600 mg via ORAL

## 2019-01-06 NOTE — ED Triage Notes (Signed)
Patient reports onset of pain in left ear 3 days ago. Last dose ibuprofen HS. Denies fever; cough.  Patient went to [place], [place] 3 weeks ago; home 10 days. Wonders if allergies flared while there.  Patient has had influenza vacc this season.

## 2019-01-06 NOTE — Discharge Instructions (Addendum)
May add Pseudoephedrine (30mg , one or two every 4 to 6 hours) for sinus congestion.  Get adequate rest.   May take Ibuprofen 200mg , 4 tabs every 8 hours with food for ear pain.

## 2019-01-06 NOTE — ED Provider Notes (Signed)
[name]    CSN: [id number] Arrival date & time: 01/06/19  1030      History   Chief Complaint Chief Complaint  Patient presents with  . Otalgia    left    HPI Sarah Nicholson Sarah Nicholson is a 48 y.o. female.   Patient complains of onset of pain in her left ear 4 days ago.  She is also developing mild pain in front of her left ear.  She denies nasal congestion or URI symptoms, and has had no fevers, chills, and sweats.  The history is provided by the patient.    Past Medical History:  Diagnosis Date  . ADD (attention deficit disorder) 06/14/2012  . Bronchitis   . Depression 06/14/2012  . Hepatic steatosis 11/13/2018   Hepatic steatosis with hepatomegaly seen on MRI abd on 11/2018  . History of leukocytosis 06/14/2012   Reports "WBC 32" decades ago with negative hematology specialist workup.   [name] Hyperlipidemia 06/14/2012  . Prediabetes 06/14/2012  . Seasonal allergies 06/14/2012    Patient Active Problem List   Diagnosis Date Noted  . Hepatic steatosis 11/13/2018  . Tobacco use 07/18/2018  . Sun-induced skin changes, keratosis 08/08/2017  . Subcutaneous cyst 08/08/2017  . Irritable bowel syndrome (IBS) 05/04/2017  . Generalized anxiety disorder 03/16/2016  . Social anxiety disorder 03/16/2016  . Major depressive disorder, recurrent episode, moderate (Uncertain) 03/16/2016  . Right inguinal hernia 07/14/2015  . Hallux valgus 12/09/2014  . Acute headache 07/31/2014  . Chest pain 07/31/2014  . Hypotension 07/31/2014  . Other and unspecified ovarian cyst 06/25/2013  . Left tennis elbow 06/25/2013  . Left breast mass 06/27/2012  . Microscopic hematuria 06/27/2012  . Hyperlipidemia 06/14/2012  . Prediabetes 06/14/2012  . History of leukocytosis 06/14/2012  . Seasonal allergies 06/14/2012  . Depression 06/14/2012  . ADD (attention deficit disorder) 06/14/2012    Past Surgical History:  Procedure Laterality Date  . APPENDECTOMY    . bladder sling/mesh    . INGUINAL  HERNIA REPAIR    . intrauterine ablation    . NASAL SINUS SURGERY    . TUBAL LIGATION      OB History   No obstetric history on file.      Home Medications    Prior to Admission medications   Medication Sig Start Date End Date Taking? Authorizing Provider  amoxicillin (AMOXIL) 875 MG tablet Take 1 tablet (875 mg total) by mouth 2 (two) times daily. 01/06/19   Kandra Nicolas, MD  amphetamine-dextroamphetamine (ADDERALL) 10 MG tablet Take 1 tablet (10 mg total) by mouth 2 (two) times daily. 01/04/18 02/03/18  Emeterio Reeve, DO  cefdinir (OMNICEF) 300 MG capsule Take 1 capsule (300 mg total) by mouth 2 (two) times daily. 10/30/18   Gregor Hams, MD  clonazePAM (KLONOPIN) 0.5 MG tablet Take 1 tablet (0.5 mg total) by mouth 2 (two) times daily as needed for anxiety. 01/04/18   Emeterio Reeve, DO  diclofenac sodium (VOLTAREN) 1 % GEL Apply 2 g topically 4 (four) times daily. To affected joint. 05/17/18   Gregor Hams, MD  EPINEPHRINE 0.3 mg/0.3 mL IJ SOAJ injection INJECT INTRAMUSCULARLY AS DIRECTED 08/20/18   Emeterio Reeve, DO  escitalopram (LEXAPRO) 20 MG tablet TAKE 1 TABLET BY MOUTH DAILY 11/01/18   Emeterio Reeve, DO  HYDROcodone-acetaminophen (NORCO/VICODIN) 5-325 MG tablet Take 1 tablet by mouth every 6 (six) hours as needed. 10/30/18   Gregor Hams, MD    Family History Family History  Problem  Relation Age of Onset  . Depression Mother   . Diabetes Father   . Hyperlipidemia Father   . Hypertension Father   . Depression Sister   . Depression Son   . Breast cancer Maternal Grandmother     Social History Social History   Tobacco Use  . Smoking status: Current Every Day Smoker    Types: Cigarettes  . Smokeless tobacco: Never Used  Substance Use Topics  . Alcohol use: Yes    Comment: 1-2 drinks every 6 months  . Drug use: No     Allergies   Nitrofurantoin, Other, Tetanus toxoids, Macrobid [nitrofurantoin macrocrystal], and Thimerosal   Review of  Systems Review of Systems No sore throat No cough No pleuritic pain No wheezing No nasal congestion No post-nasal drainage No sinus pain/pressure No itchy/red eyes + left earache No hemoptysis No SOB No fever/chills No nausea No vomiting No abdominal pain No diarrhea No urinary symptoms No skin rash No fatigue No myalgias No headache Used OTC meds without relief   Physical Exam Triage Vital Signs ED Triage Vitals  Enc Vitals Group     BP 01/06/19 1041 115/81     Pulse Rate 01/06/19 1041 74     Resp 01/06/19 1041 16     Temp 01/06/19 1041 98.5 F (36.9 C)     Temp src --      SpO2 01/06/19 1041 96 %     Weight 01/06/19 1046 205 lb (93 kg)     Height 01/06/19 1046 5\' 6"  (1.676 m)     Head Circumference --      Peak Flow --      Pain Score 01/06/19 1045 3     Pain Loc --      Pain Edu? --      Excl. in [place]? --    No data found.  Updated Vital Signs BP 115/81 (BP Location: Right Arm)   Pulse 74   Temp 98.5 F (36.9 C)   Resp 16   Ht 5\' 6"  (1.676 m)   Wt 93 kg   SpO2 96%   BMI 33.09 kg/m   Visual Acuity Right Eye Distance:   Left Eye Distance:   Bilateral Distance:    Right Eye Near:   Left Eye Near:    Bilateral Near:     Physical Exam Vitals signs and nursing note reviewed.  Constitutional:      General: She is not in acute distress. HENT:     Head: Normocephalic.     Right Ear: Tympanic membrane, ear canal and external ear normal.     Left Ear: Ear canal and external ear normal.     Ears:      Comments: Left tympanic membrane is erythematous superiorly as noted on diagram.  There is also mild tenderness to palpation over left parotid gland without swelling or erythema.    Nose: Nose normal.     Mouth/Throat:     Pharynx: Oropharynx is clear.  Eyes:     Extraocular Movements: Extraocular movements intact.     Conjunctiva/sclera: Conjunctivae normal.     Pupils: Pupils are equal, round, and reactive to light.  Neck:      Musculoskeletal: Normal range of motion and neck supple.  Cardiovascular:     Rate and Rhythm: Normal rate.  Pulmonary:     Effort: Pulmonary effort is normal.  Lymphadenopathy:     Cervical: No cervical adenopathy.  Skin:    General: Skin is warm  and dry.  Neurological:     Mental Status: She is alert.      UC Treatments / Results  Labs (all labs ordered are listed, but only abnormal results are displayed) Labs Reviewed -  Tympanometry:  Right ear tympanogram low peak height; Left ear tympanogram low peak height  EKG   Radiology No results found.  Procedures Procedures (including critical care time)  Medications Ordered in UC Medications  ibuprofen (ADVIL) tablet 600 mg (600 mg Oral Given 01/06/19 1053)    Initial Impression / Assessment and Plan / UC Course  I have reviewed the triage vital signs and the nursing notes.  Pertinent labs & imaging results that were available during my care of the patient were reviewed by me and considered in my medical decision making (see chart for details).    ?left parotitis also.  Begin amoxicillin 875mg  BID for 10 days. Followup with Family Doctor if not improved in about 10 days.   Final Clinical Impressions(s) / UC Diagnoses   Final diagnoses:  Acute left otitis media     Discharge Instructions     May add Pseudoephedrine (30mg , one or two every 4 to 6 hours) for sinus congestion.  Get adequate rest.   May take Ibuprofen 200mg , 4 tabs every 8 hours with food for ear pain.      ED Prescriptions    Medication Sig Dispense Auth. Provider   amoxicillin (AMOXIL) 875 MG tablet Take 1 tablet (875 mg total) by mouth 2 (two) times daily. 20 tablet Kandra Nicolas, MD        Kandra Nicolas, MD 01/06/19 1352

## 2019-01-07 ENCOUNTER — Telehealth: Payer: Self-pay | Admitting: Emergency Medicine

## 2019-02-04 ENCOUNTER — Other Ambulatory Visit: Payer: Self-pay | Admitting: *Deleted

## 2019-02-04 ENCOUNTER — Encounter: Payer: Self-pay | Admitting: Family Medicine

## 2019-02-04 ENCOUNTER — Ambulatory Visit (INDEPENDENT_AMBULATORY_CARE_PROVIDER_SITE_OTHER): Payer: 59 | Admitting: Family Medicine

## 2019-02-04 VITALS — BP 170/103 | Temp 99.6°F

## 2019-02-04 DIAGNOSIS — F988 Other specified behavioral and emotional disorders with onset usually occurring in childhood and adolescence: Secondary | ICD-10-CM

## 2019-02-04 DIAGNOSIS — H9202 Otalgia, left ear: Secondary | ICD-10-CM

## 2019-02-04 MED ORDER — AMOXICILLIN-POT CLAVULANATE 875-125 MG PO TABS: 1.0000 | ORAL_TABLET | Freq: Two times a day (BID) | ORAL | 0 refills | Status: DC

## 2019-02-04 MED ORDER — AMPHETAMINE-DEXTROAMPHETAMINE 10 MG PO TABS: 10.0000 mg | ORAL_TABLET | Freq: Two times a day (BID) | ORAL | 0 refills | Status: DC

## 2019-02-04 NOTE — Progress Notes (Signed)
Virtual Visit via Video Note  I connected with Sarah Nicholson on 02/04/19 at  3:20 PM EST by a video enabled telemedicine application and verified that I am speaking with the correct person using two identifiers.   I discussed the limitations of evaluation and management by telemedicine and the availability of in person appointments. The patient expressed understanding and agreed to proceed.  Subjective:    CC: Left ear pain.    HPI: L ear pain she reports that she was treated for this on October 25 at urgent care and given Sudafed, amoxicillin, and ibuprofen. She now states that her left ear is bothering her x4 days and has used IBU for the pain which hasn't helped she also feels tired and dizzy denies any congestion but has a low grade fever.  No drainage from the ear.  She has had a mild sore throat but just in the mornings and felt that is more from the heat running.  She has had a slight cough more so up in her throat area.  She denies any postnasal drip.  No other URI sxs.  No swollen LNs in the neck.  No GI sxs.     Past medical history, Surgical history, Family history not pertinant except as noted below, Social history, Allergies, and medications have been entered into the medical record, reviewed, and corrections made.   Review of Systems: No fevers, chills, night sweats, weight loss, chest pain, or shortness of breath.   Objective:    General: Speaking clearly in complete sentences without any shortness of breath.  Alert and oriented x3.  Normal judgment. No apparent acute distress.    Impression and Recommendations:    Left ear pain-unclear if true otitis media or not.  We are limiting inpatient visits because of Covid pandemic so we will go ahead and treat with Augmentin.  If not improving then recommend that she come in for formal evaluation.  We also discussed that if cough does not improve and it progresses worsens or does not resolve and she may need to consider  getting tested for Covid.  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     I discussed the assessment and treatment plan with the patient. The patient was provided an opportunity to ask questions and all were answered. The patient agreed with the plan and demonstrated an understanding of the instructions.   The patient was advised to call back or seek an in-person evaluation if the symptoms worsen or if the condition fails to improve as anticipated.   Beatrice Lecher, MD

## 2019-02-04 NOTE — Telephone Encounter (Signed)
Pt has virtual visit with Dr. Madilyn Fireman today and asked for refill of Adderall 10 mg advised her that I would send her request to her pcp for approval..Sarah Nicholson, [place] Crocker, CMA

## 2019-02-04 NOTE — Telephone Encounter (Signed)
Last seen 03/2018.  I know this patient has financial issues but we really do have to see her every 6 months, will let it slide for now because of COVID, will send refill, but can she please schedule something for January to follow up ADD? Thanks!

## 2019-02-05 NOTE — Telephone Encounter (Signed)
Called pt and advised her of recommendations and she stated that she will call back to schedule an appointment.Marland KitchenMarland KitchenElouise Nicholson, [place]

## 2019-02-11 ENCOUNTER — Telehealth: Payer: Self-pay

## 2019-02-11 NOTE — Telephone Encounter (Signed)
Okay to schedule in our office for ear pain.

## 2019-02-11 NOTE — Telephone Encounter (Signed)
Patient has been scheduled with PCP and is ok per Dr. Madilyn Fireman.  No other inquires at this time.

## 2019-02-11 NOTE — Telephone Encounter (Signed)
Sarah Nicholson called and states she is still having left ear pain. She had this in October and it did resolve but return 10 days ago. I am not sure about next steps. Patient would like to be seen in person. She states the cough has resolved. Should she have a visit with [place] or go to ENT.

## 2019-02-12 ENCOUNTER — Encounter: Payer: Self-pay | Admitting: Osteopathic Medicine

## 2019-02-12 ENCOUNTER — Ambulatory Visit (INDEPENDENT_AMBULATORY_CARE_PROVIDER_SITE_OTHER): Payer: 59 | Admitting: Osteopathic Medicine

## 2019-02-12 ENCOUNTER — Other Ambulatory Visit: Payer: Self-pay

## 2019-02-12 VITALS — BP 150/93 | HR 69 | Temp 98.0°F | Wt 214.1 lb

## 2019-02-12 DIAGNOSIS — H9202 Otalgia, left ear: Secondary | ICD-10-CM

## 2019-02-12 MED ORDER — GABAPENTIN 100 MG PO CAPS: 100.0000 mg | ORAL_CAPSULE | Freq: Three times a day (TID) | ORAL | 1 refills | Status: DC

## 2019-02-12 MED ORDER — PREDNISONE 20 MG PO TABS: 20.0000 mg | ORAL_TABLET | Freq: Two times a day (BID) | ORAL | 0 refills | Status: DC

## 2019-02-12 NOTE — Progress Notes (Signed)
In, HPI: Sarah Nicholson is a 48 y.o. female who  has a past medical history of ADD (attention deficit disorder) (06/14/2012), Bronchitis, Depression (06/14/2012), Hepatic steatosis (11/13/2018), History of leukocytosis (06/14/2012), Hyperlipidemia (06/14/2012), Prediabetes (06/14/2012), and Seasonal allergies (06/14/2012).  she presents to [facility] today, 02/12/19,  for chief complaint of:  Ear pain  . Location: L ear, feels deep in ear, no radiation elsewhere no jaw pain no temple pain  . Quality/Timing/Severity: sharp pain, feels like a 10/10 stab at some times, otherwise a low dull aching 3/10 pain  . Treated w/ augmentin 02/04/19 by Dr Madilyn Fireman, urgent care had treated on 01/06/19 and noted some TM erythema on exam w/ questionable L parotitis they treated w/ amoxicillin 857 bid x 10 days    At today's visit 02/12/19 ... PMH, PSH, FH reviewed and updated as needed.  Current medication list and allergy/intolerance hx reviewed and updated as needed. (See remainder of HPI, ROS, Phys Exam below)   No results found.  No results found for this or any previous visit (from the past 72 hour(s)).        ASSESSMENT/PLAN: The encounter diagnosis was Otalgia of left ear.   Seems less likely infectious, possible nerve impingement, ?microscopic? Will trial steroid burst and gabapentin, refer to ENT to assess if any additional testing/imaging or therapies would be recommended   Orders Placed This Encounter  Procedures  . Ambulatory referral to ENT     Meds ordered this encounter  Medications  . predniSONE (DELTASONE) 20 MG tablet    Sig: Take 1 tablet (20 mg total) by mouth 2 (two) times daily with a meal.    Dispense:  10 tablet    Refill:  0  . gabapentin (NEURONTIN) 100 MG capsule    Sig: Take 1-4 capsules (100-400 mg total) by mouth 3 (three) times daily.    Dispense:  90 capsule    Refill:  1      Follow-up plan: Return if symptoms worsen or  fail to improve.                                                 ################################################# ################################################# ################################################# #################################################    Current Meds  Medication Sig  . amoxicillin-clavulanate (AUGMENTIN) 875-125 MG tablet Take 1 tablet by mouth 2 (two) times daily.  [name] amphetamine-dextroamphetamine (ADDERALL) 10 MG tablet Take 1 tablet (10 mg total) by mouth 2 (two) times daily.  . diclofenac sodium (VOLTAREN) 1 % GEL Apply 2 g topically 4 (four) times daily. To affected joint.  [name] EPINEPHRINE 0.3 mg/0.3 mL IJ SOAJ injection INJECT INTRAMUSCULARLY AS DIRECTED  . escitalopram (LEXAPRO) 20 MG tablet TAKE 1 TABLET BY MOUTH DAILY    Allergies  Allergen Reactions  . Nitrofurantoin Hives and Rash  . Other Hives    Preservative used in vaccinations  . Tetanus Toxoids Swelling    Pt states she was told the Tdap injection would have to be administered in the hospital  After discussion on 11/10 pt remembers being told it was thimersol in the tetanus 30+years ago that caused the swelling.    [name] Macrobid [Nitrofurantoin Macrocrystal] Hives  . Thimerosal Rash       Review of Systems:  Constitutional: No recent illness  HEENT: +mild frontal headache, no vision change, +ear pain per HPI  Cardiac: No  chest pain, No  pressure, No palpitations  Respiratory:  No  shortness of breath. No  Cough  Gastrointestinal: No  abdominal pain  Musculoskeletal: No new myalgia/arthralgia  Neurologic: No  weakness, No  Dizziness   Exam:  BP (!) 150/93 (BP Location: Right Arm, Patient Position: Sitting, Cuff Size: Normal)   Pulse 69   Temp 98 F (36.7 C) (Oral)   Wt 214 lb 1.3 oz (97.1 kg)   BMI 34.55 kg/m   Constitutional: VS see above. General Appearance: alert, well-developed, well-nourished, NAD  Eyes: Normal  lids and conjunctive, non-icteric sclera  Ears, Nose, Mouth, Throat: TM normal bilaterally w/ some clear effusion bilaterally, TM otherwise normal and canals normal   Neck: No masses, trachea midline.   Respiratory: Normal respiratory effort.   Neurological: Normal balance/coordination. No tremor.  Skin: warm, dry, intact.   Psychiatric: Normal judgment/insight. Normal mood and affect. Oriented x3.       Visit summary with medication list and pertinent instructions was printed for patient to review, patient was advised to alert [place] if any updates are needed. All questions at time of visit were answered - patient instructed to contact office with any additional concerns. ER/RTC precautions were reviewed with the patient and understanding verbalized.    Please note: voice recognition software was used to produce this document, and typos may escape review. Please contact Dr. Sheppard Coil for any needed clarifications.    Follow up plan: Return if symptoms worsen or fail to improve.

## 2019-04-08 IMAGING — MG DIGITAL DIAGNOSTIC UNILATERAL LEFT MAMMOGRAM WITH TOMO AND CAD
6 series · 6 of 18 positions shown · non-contrast
Comparison: March 29, 2018 and August 2011

CLINICAL DATA: 47-year-old patient recalled from recent screening
mammogram for evaluation of possible left breast distortion
questioned in only the CC projection.

EXAM:
DIGITAL DIAGNOSTIC UNILATERAL LEFT MAMMOGRAM WITH CAD AND TOMO

[L CC synth-2D (1 of 2)]
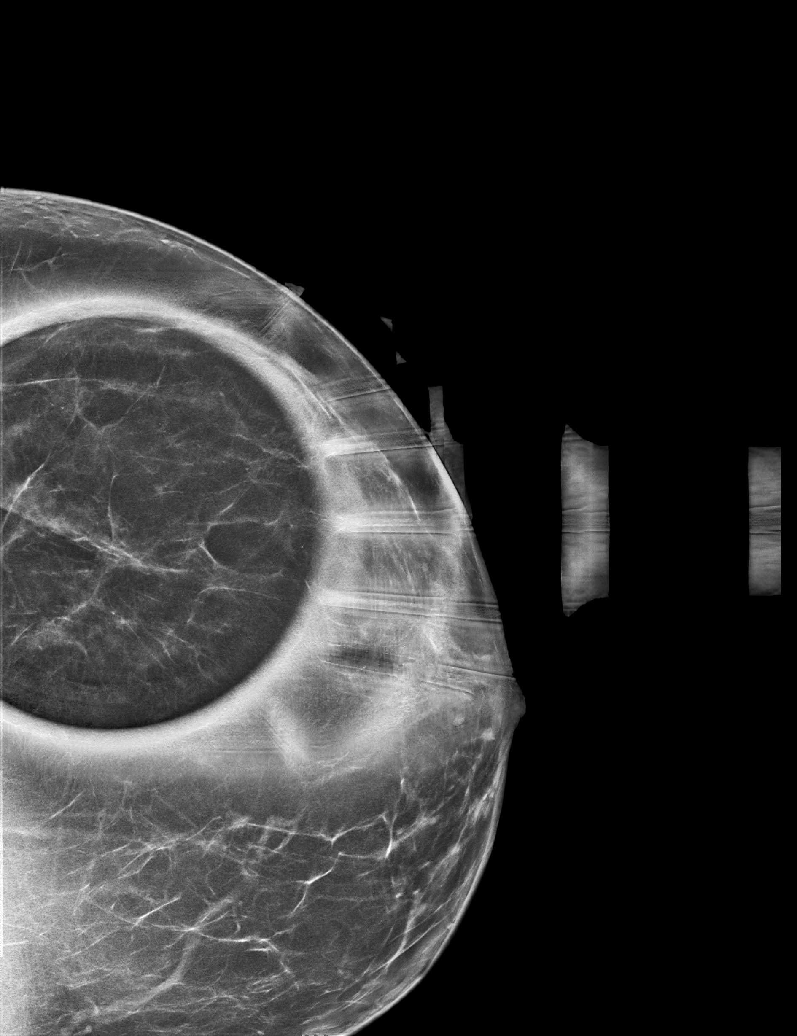

[L ML synth-2D]
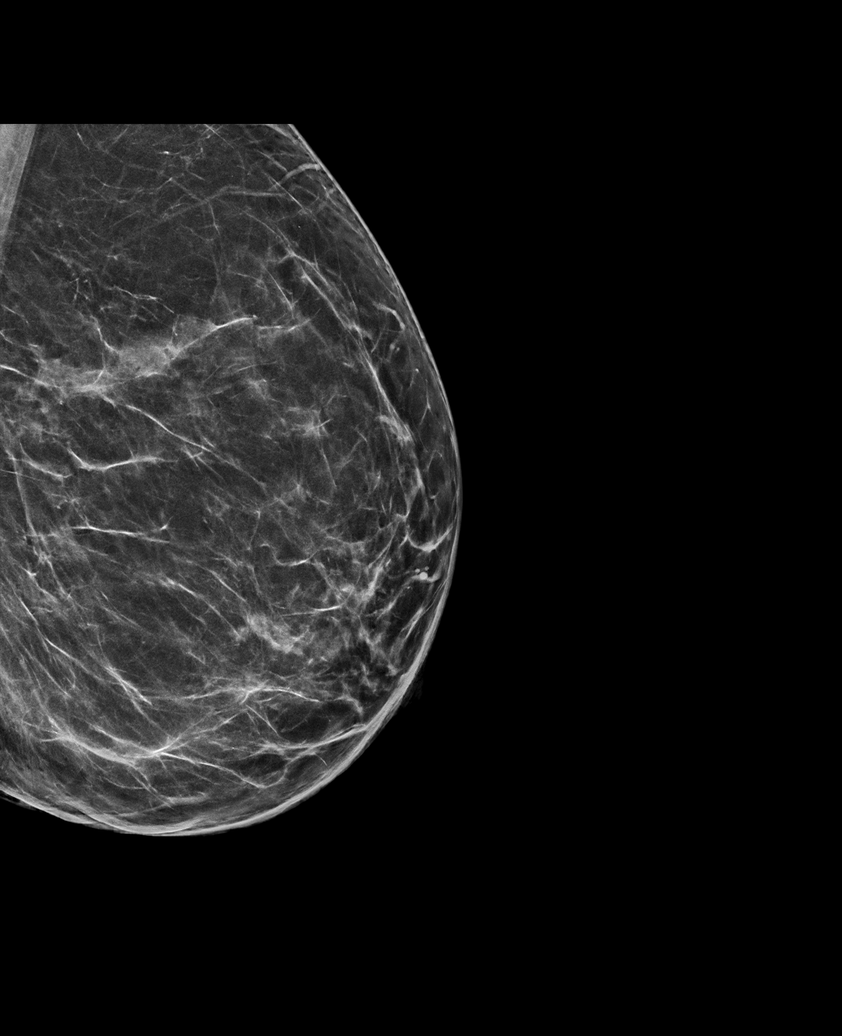

[L CC synth-2D (2 of 2)]
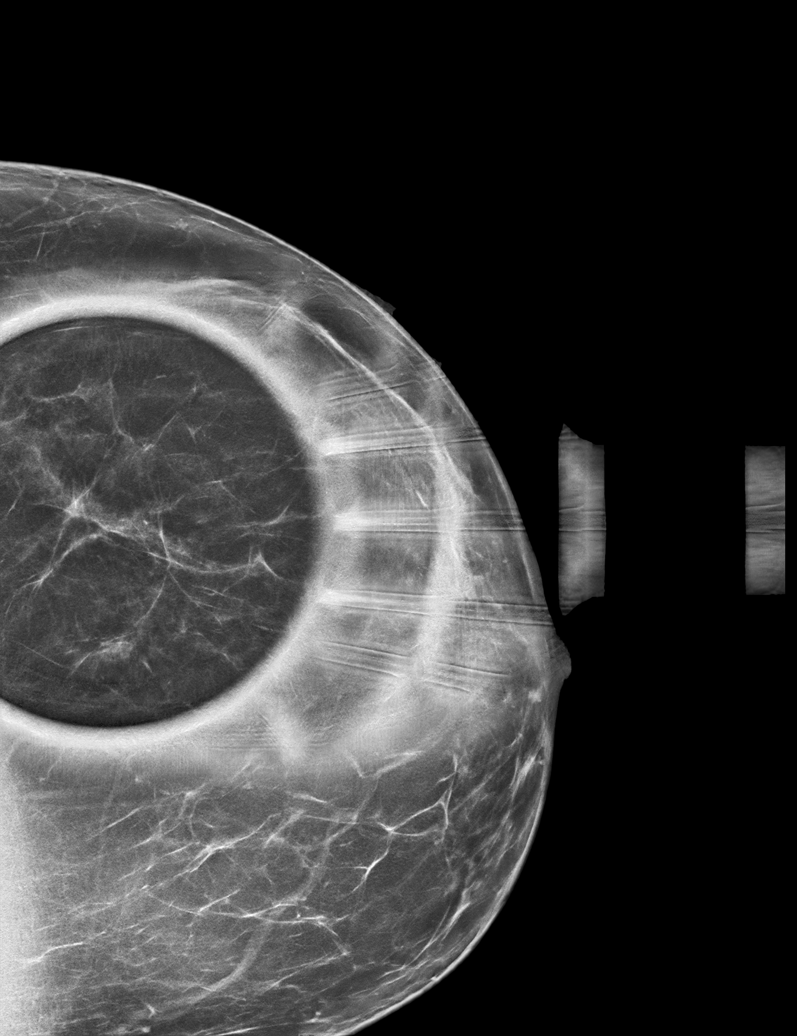

[L CC tomo (1 of 2) · tomo slice 34/67.0]
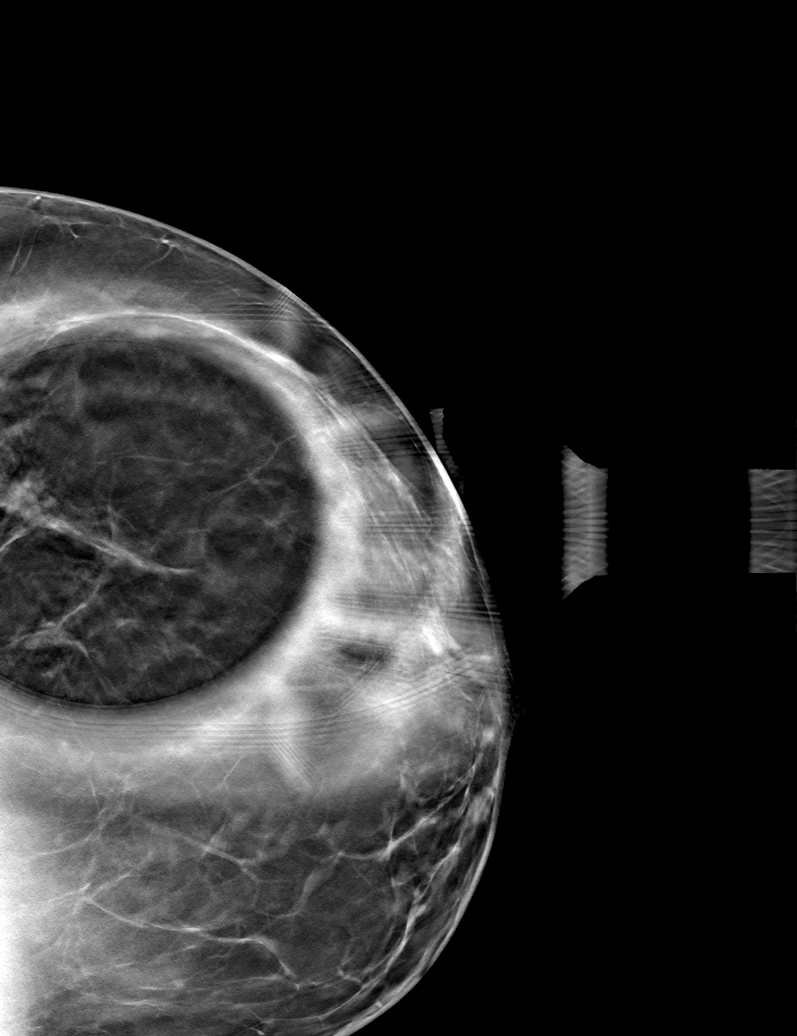

[L ML tomo · tomo slice 38/75.0]
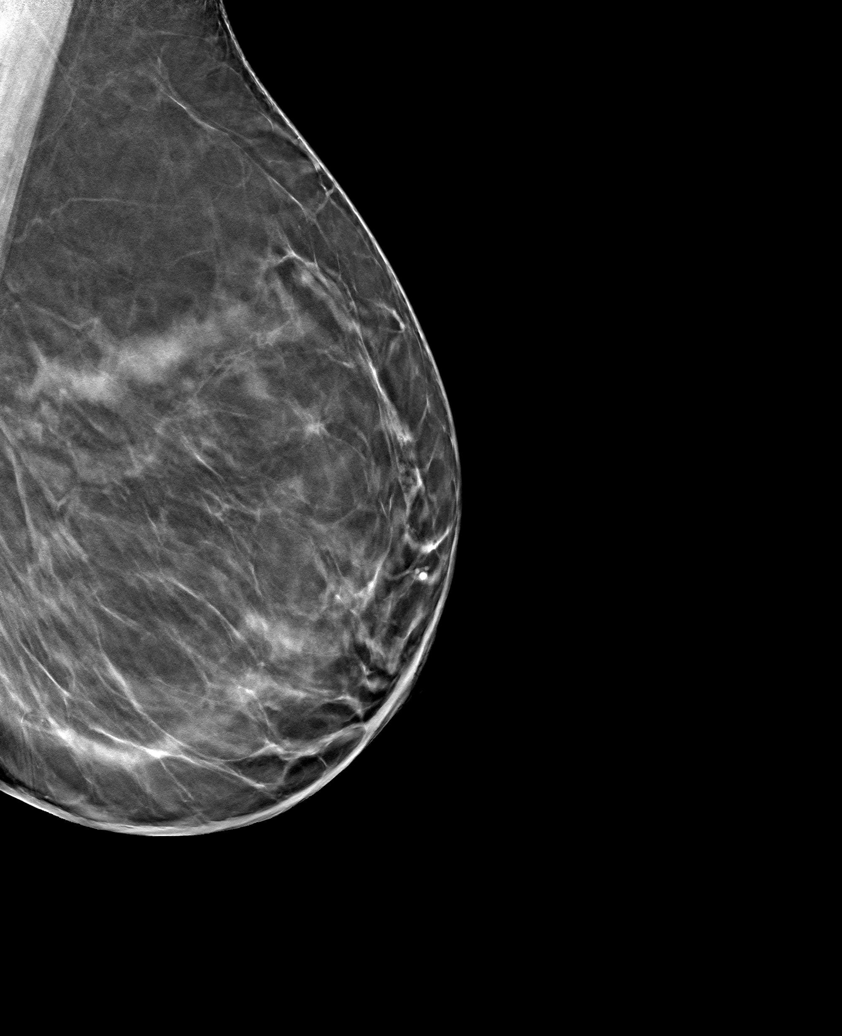

[L CC tomo (2 of 2) · tomo slice 33/65.0]
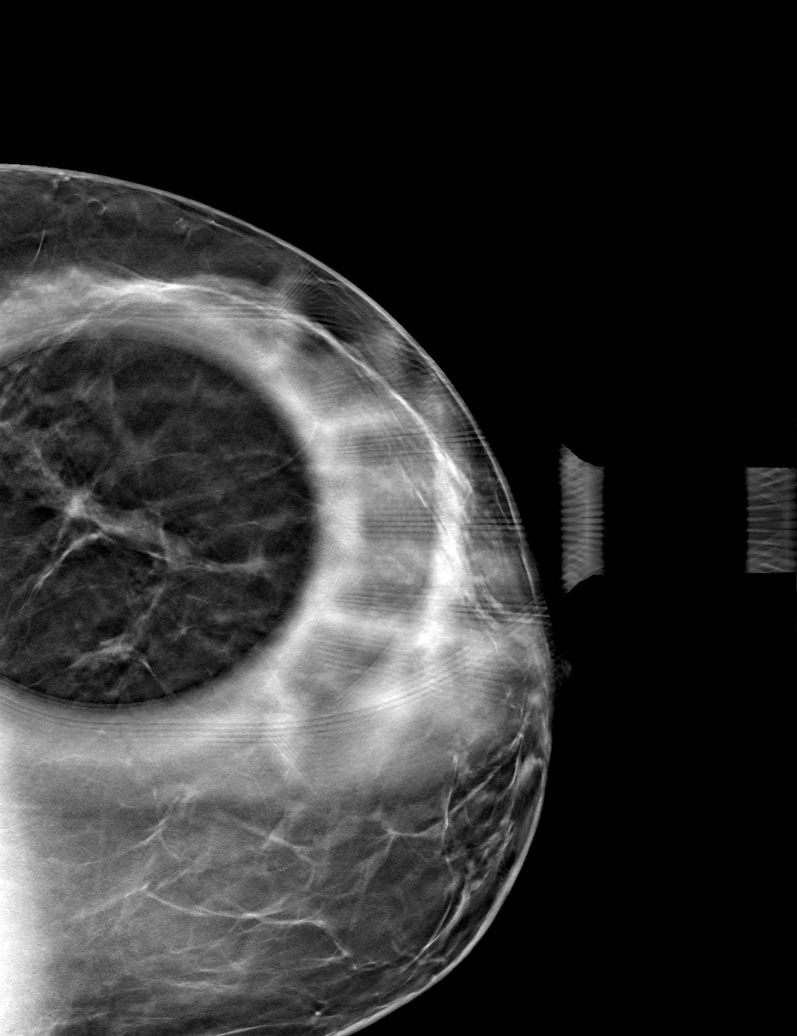

[6 of 18 positions shown; findings below may reference images not displayed]

ACR Breast Density Category b: There are scattered areas of
fibroglandular density.
FINDINGS: Spot compression views of the outer left breast in the CC
projection, including tomography, show no mass or distortion. 90
degree view of left breast with tomography is negative for
distortion or mass. No suspicious findings.

Mammographic images were processed with CAD.
IMPRESSION: No evidence of malignancy in the left breast.

RECOMMENDATION:
Screening mammogram in one year.(Code:[id number])

I have discussed the findings and recommendations with the patient.
Results were also provided in writing at the conclusion of the
visit. If applicable, a reminder letter will be sent to the patient
regarding the next appointment.

BI-RADS CATEGORY  1: Negative.

## 2019-04-11 ENCOUNTER — Other Ambulatory Visit: Payer: Self-pay

## 2019-04-11 MED ORDER — GABAPENTIN 100 MG PO CAPS: 100.0000 mg | ORAL_CAPSULE | Freq: Three times a day (TID) | ORAL | 1 refills | Status: DC

## 2019-04-24 ENCOUNTER — Telehealth (INDEPENDENT_AMBULATORY_CARE_PROVIDER_SITE_OTHER): Payer: 59 | Admitting: Medical-Surgical

## 2019-04-24 ENCOUNTER — Encounter: Payer: Self-pay | Admitting: Medical-Surgical

## 2019-04-24 ENCOUNTER — Other Ambulatory Visit: Payer: Self-pay

## 2019-04-24 VITALS — Temp 101.6°F

## 2019-04-24 DIAGNOSIS — B349 Viral infection, unspecified: Secondary | ICD-10-CM

## 2019-04-24 MED ORDER — OSELTAMIVIR PHOSPHATE 75 MG PO CAPS: 75.0000 mg | ORAL_CAPSULE | Freq: Two times a day (BID) | ORAL | 0 refills | Status: DC

## 2019-04-24 NOTE — Progress Notes (Signed)
Virtual Visit via Video Note  I connected with Sarah Nicholson on 04/24/19 at 10:10 AM EST by a video enabled telemedicine application and verified that I am speaking with the correct person using two identifiers.   I discussed the limitations of evaluation and management by telemedicine and the availability of in person appointments. The patient expressed understanding and agreed to proceed.  Subjective:    CC: Fever, flu-like symptoms  HPI: 48 year old female presenting via MyChart video visit with complaints of nausea, diarrhea, fatigue, fever, chills, body aches, and decreased appetite starting yesterday morning.  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  Has had both doses of the Covid vaccine, one at the end of December and the other in January.  Is taking ibuprofen as needed with no relief of symptoms.  Was tested using rapid Covid test 2 days ago with negative results.  Denies chest pain, shortness of breath. Went to work yesterday and today and was sent home due to fever.   Past medical history, Surgical history, Family history not pertinant except as noted below, Social history, Allergies, and medications have been entered into the medical record, reviewed, and corrections made.   Review of Systems: No night sweats, weight loss, chest pain, or shortness of breath.   Objective:    General: Speaking clearly in complete sentences without any shortness of breath.  Alert and oriented x3.  Normal judgment. No apparent acute distress.   Impression and Recommendations:    Viral illness Presentation suspicious for influenza vs. Covid infection. Recommend laboratory Covid testing and flu testing.  We will go ahead and start Tamiflu 75 mg twice daily x5 days.  Reinforced transmission precautions, no return to work for at least 7 days.  May continue to use ibuprofen as needed.  Consider adding Tylenol as needed for better relief of fever and body aches.  Reviewed symptoms  requiring emergency care.  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   Return if symptoms worsen or fail to improve.   I discussed the assessment and treatment plan with the patient. The patient was provided an opportunity to ask questions and all were answered. The patient agreed with the plan and demonstrated an understanding of the instructions.   The patient was advised to call back or seek an in-person evaluation if the symptoms worsen or if the condition fails to improve as anticipated.  25 minutes of non-face-to-face time was provided during this encounter.  Clearnce Sorrel, DNP, APRN, FNP-BC [facility]

## 2019-04-30 ENCOUNTER — Other Ambulatory Visit: Payer: Self-pay

## 2019-04-30 DIAGNOSIS — F411 Generalized anxiety disorder: Secondary | ICD-10-CM

## 2019-04-30 DIAGNOSIS — F329 Major depressive disorder, single episode, unspecified: Secondary | ICD-10-CM

## 2019-04-30 DIAGNOSIS — F32A Depression, unspecified: Secondary | ICD-10-CM

## 2019-04-30 MED ORDER — ESCITALOPRAM OXALATE 20 MG PO TABS: 20.0000 mg | ORAL_TABLET | Freq: Every day | ORAL | 0 refills | Status: DC

## 2019-05-07 ENCOUNTER — Encounter: Payer: Self-pay | Admitting: Sports Medicine

## 2019-05-07 ENCOUNTER — Other Ambulatory Visit: Payer: Self-pay

## 2019-05-07 ENCOUNTER — Ambulatory Visit (INDEPENDENT_AMBULATORY_CARE_PROVIDER_SITE_OTHER): Payer: 59 | Admitting: Sports Medicine

## 2019-05-07 DIAGNOSIS — M19011 Primary osteoarthritis, right shoulder: Secondary | ICD-10-CM | POA: Diagnosis not present

## 2019-05-07 NOTE — Assessment & Plan Note (Signed)
Sarah Nicholson is starting to have recurrence of pain in her right acromioclavicular joint, this was last injected by Dr. Georgina Snell back in August of last year. Repeat injection today, this time with higher triamcinolone dose. Return to see me as needed for this.

## 2019-05-07 NOTE — Progress Notes (Signed)
    Procedures performed today:    Procedure: Real-time Ultrasound Guided injection of the right acromioclavicular joint Device: Samsung HS60  Verbal informed consent obtained.  Time-out conducted.  Noted no overlying erythema, induration, or other signs of local infection.  Skin prepped in a sterile fashion.  Local anesthesia: Topical Ethyl chloride.  With sterile technique and under real time ultrasound guidance:  1 cc Kenalog 40, 1/2 cc lidocaine injected easily completed without difficulty  Pain immediately resolved suggesting accurate placement of the medication.  Advised to call if fevers/chills, erythema, induration, drainage, or persistent bleeding.  Images permanently stored and available for review in the ultrasound unit.  Impression: Technically successful ultrasound guided injection.  Independent interpretation of tests performed by another provider:   None.  Impression and Recommendations:    Osteoarthritis of right acromioclavicular joint Sarah Nicholson is starting to have recurrence of pain in her right acromioclavicular joint, this was last injected by Dr. Georgina Snell back in August of last year. Repeat injection today, this time with higher triamcinolone dose. Return to see me as needed for this.    ___________________________________________ [name], M.D., ABFM., CAQSM. Primary Care and [place] Instructor of [place] [facility]

## 2019-06-05 ENCOUNTER — Encounter: Payer: Self-pay | Admitting: Sports Medicine

## 2019-06-05 ENCOUNTER — Telehealth: Payer: Self-pay

## 2019-06-05 ENCOUNTER — Ambulatory Visit (INDEPENDENT_AMBULATORY_CARE_PROVIDER_SITE_OTHER): Payer: 59 | Admitting: Osteopathic Medicine

## 2019-06-05 VITALS — BP 139/81 | HR 92 | Ht 66.0 in | Wt 211.0 lb

## 2019-06-05 DIAGNOSIS — F410 Panic disorder [episodic paroxysmal anxiety] without agoraphobia: Secondary | ICD-10-CM | POA: Diagnosis not present

## 2019-06-05 MED ORDER — CLONAZEPAM 0.5 MG PO TABS: 0.5000 mg | ORAL_TABLET | Freq: Three times a day (TID) | ORAL | 0 refills | Status: DC | PRN

## 2019-06-05 NOTE — Patient Instructions (Signed)
Can use 2-3 times daily for 1-2 weeks, then go down to 1-2 times daily for 1-2 weeks, then off this Rx / save for as-needed use.

## 2019-06-05 NOTE — Telephone Encounter (Signed)
[facility] pharmacy requesting med refill for clonazepam. Rx not listed in active med list.

## 2019-06-05 NOTE — Progress Notes (Signed)
Sarah Nicholson is a 49 y.o. female who presents to  [place] [facility]  today, 06/05/19, seeking care for the following: . Anxiety/panic attack     ASSESSMENT & PLAN with other pertinent history/findings:  The encounter diagnosis was Severe anxiety with panic.  Patient recently discovered that her husband had continued to be unfaithful, despite counseling, it had also been going on longer than she thought it was.  She is very distraught over this of course, but is also having difficulty coping given that her mother-in-law, with whom she is very close and would normally talk about these issues with, is in the terminal stages of pancreatic cancer so understandably she does not want to burden her with this information.  Is also stressful since her husband is still living with her, as he has nowhere to go other than his parents house.  Sarah Nicholson has been overall coping okay but recently unable to work due to panic issues, would like to discuss increasing clonazepam for short-term.  I think increasing clonazepam is totally reasonable, I think we can do 2-3 times a day for several days to a week, then taper off over the next few weeks.  Patient is already connected with mental health professional/counselor.   Patient Instructions  Can use 2-3 times daily for 1-2 weeks, then go down to 1-2 times daily for 1-2 weeks, then off this Rx / save for as-needed use.     No orders of the defined types were placed in this encounter.   Meds ordered this encounter  Medications  . clonazePAM (KLONOPIN) 0.5 MG tablet    Sig: Take 1 tablet (0.5 mg total) by mouth 3 (three) times daily as needed for anxiety.    Dispense:  90 tablet    Refill:  0       Follow-up instructions: Return in about 4 weeks (around 07/03/2019) for VIRTUAL VISIT-or- IN-OFFICE VISIT - recheck mental health  .                                         BP 139/81   Pulse 92   Ht 5\' 6"  (1.676 m)   Wt 211 lb (95.7 kg)   BMI 34.06 kg/m   No outpatient medications have been marked as taking for the 06/05/19 encounter (Office Visit) with Emeterio Reeve, DO.    No results found for this or any previous visit (from the past 72 hour(s)).  No results found.  Depression screen [facility] 2/9 06/05/2019 02/12/2019 03/19/2018  Decreased Interest 3 1 1   Down, Depressed, Hopeless 3 1 0  PHQ - 2 Score 6 2 1   Altered sleeping 3 0 1  Tired, decreased energy 3 1 1   Change in appetite 3 1 1   Feeling bad or failure about yourself  3 0 0  Trouble concentrating 3 0 1  Moving slowly or fidgety/restless 3 0 0  Suicidal thoughts 0 0 0  PHQ-9 Score 24 4 5   Difficult doing work/chores - Not difficult at all Not difficult at all  Some encounter information is confidential and restricted. Go to Review Flowsheets activity to see all data.  Some recent data might be hidden    GAD 7 : Generalized Anxiety Score 06/05/2019 02/12/2019 03/19/2018 03/05/2018  Nervous, Anxious, on Edge 3 1 1 1   Control/stop worrying 3 0 0 1  Worry too much - different things 3 0 1 1  Trouble relaxing 3 0 1 1  Restless 3 0 0 0  Easily annoyed or irritable 2 1 1 1   Afraid - awful might happen 3 0 1 0  Total GAD 7 Score 20 2 5 5   Anxiety Difficulty - Not difficult at all Not difficult at all Not difficult at all  Some encounter information is confidential and restricted. Go to Review Flowsheets activity to see all data.      All questions at time of visit were answered - patient instructed to contact office with any additional concerns or updates.  ER/RTC precautions were reviewed with the patient.  Please note: voice recognition software was used to produce this document, and typos may escape review. Please contact Dr. Sheppard Coil for any needed clarifications.   Total encounter time: 20 minutes.

## 2019-06-06 ENCOUNTER — Ambulatory Visit: Payer: Self-pay | Admitting: Sports Medicine

## 2019-07-29 ENCOUNTER — Other Ambulatory Visit: Payer: Self-pay

## 2019-07-29 DIAGNOSIS — F32A Depression, unspecified: Secondary | ICD-10-CM

## 2019-07-29 DIAGNOSIS — F411 Generalized anxiety disorder: Secondary | ICD-10-CM

## 2019-07-29 MED ORDER — ESCITALOPRAM OXALATE 20 MG PO TABS: 20.0000 mg | ORAL_TABLET | Freq: Every day | ORAL | 1 refills | Status: DC

## 2019-08-30 ENCOUNTER — Telehealth: Payer: Self-pay | Admitting: *Deleted

## 2019-08-30 ENCOUNTER — Telehealth: Payer: Self-pay | Admitting: Sports Medicine

## 2019-08-30 MED ORDER — HYDROCODONE-ACETAMINOPHEN 5-325 MG PO TABS: 1.0000 | ORAL_TABLET | Freq: Three times a day (TID) | ORAL | 0 refills | Status: DC | PRN

## 2019-08-30 NOTE — Telephone Encounter (Signed)
Short amount of hydrocodone sent in.

## 2019-08-30 NOTE — Telephone Encounter (Signed)
Pt notified of rx. 

## 2019-08-30 NOTE — Telephone Encounter (Signed)
Left PT voicemail. Thank you

## 2019-08-30 NOTE — Telephone Encounter (Signed)
Patient accidentally canceled their appointment with the automated call system.Sadly for them the appointment was taken.   Are we able to double book on 6/22 around 1:30. Or would you like me to push it back?  Thank you.

## 2019-08-30 NOTE — Telephone Encounter (Signed)
Pt left vm today wanting to know if you would send in some pain med to get her to her appointment.  She accidentally cancelled her appointment with you for next week and had to be scheduled for another week out.

## 2019-08-30 NOTE — Telephone Encounter (Signed)
Push it to what ever my next available slot is.

## 2019-09-03 ENCOUNTER — Ambulatory Visit: Payer: 59 | Admitting: Sports Medicine

## 2019-09-09 ENCOUNTER — Ambulatory Visit (INDEPENDENT_AMBULATORY_CARE_PROVIDER_SITE_OTHER): Payer: 59 | Admitting: Sports Medicine

## 2019-09-09 DIAGNOSIS — M19011 Primary osteoarthritis, right shoulder: Secondary | ICD-10-CM

## 2019-09-09 NOTE — Progress Notes (Signed)
    Procedures performed today:    Procedure: Real-time Ultrasound Guided injection of the right acromioclavicular joint Device: Samsung HS60  Verbal informed consent obtained.  Time-out conducted.  Noted no overlying erythema, induration, or other signs of local infection.  Skin prepped in a sterile fashion.  Local anesthesia: Topical Ethyl chloride.  With sterile technique and under real time ultrasound guidance:  1/2 cc lidocaine, 1/2 cc Kenalog 40 injected easily.   Completed without difficulty  Pain immediately resolved suggesting accurate placement of the medication.  Advised to call if fevers/chills, erythema, induration, drainage, or persistent bleeding.  Images permanently stored and available for review in the ultrasound unit.  Impression: Technically successful ultrasound guided injection.  Independent interpretation of notes and tests performed by another provider:   None.  Brief History, Exam, Impression, and Recommendations:    Osteoarthritis of right acromioclavicular joint This is a pleasant 49 year old female, right acromioclavicular arthritis, she did well after an injection back in February, as well as 1 last year. Having a recurrence of pain, repeat acromioclavicular injection, referral to Dr. Griffin Basil for discussion of distal clavicular excision.    ___________________________________________ [name], M.D., ABFM., CAQSM. Primary Care and [place] Instructor of [place] [facility] Medicine

## 2019-09-09 NOTE — Assessment & Plan Note (Signed)
This is a pleasant 49 year old female, right acromioclavicular arthritis, she did well after an injection back in February, as well as 1 last year. Having a recurrence of pain, repeat acromioclavicular injection, referral to Dr. Griffin Basil for discussion of distal clavicular excision.

## 2019-10-02 ENCOUNTER — Encounter (HOSPITAL_BASED_OUTPATIENT_CLINIC_OR_DEPARTMENT_OTHER): Payer: Self-pay | Admitting: Orthopaedic Surgery

## 2019-10-02 ENCOUNTER — Other Ambulatory Visit: Payer: Self-pay

## 2019-10-07 ENCOUNTER — Other Ambulatory Visit (HOSPITAL_COMMUNITY)
Admission: RE | Admit: 2019-10-07 | Discharge: 2019-10-07 | Disposition: A | Discharge status: Home / Self Care | Payer: 59 | Source: Ambulatory Visit | Attending: Orthopaedic Surgery | Admitting: Orthopaedic Surgery

## 2019-10-07 DIAGNOSIS — Z20822 Contact with and (suspected) exposure to covid-19: Secondary | ICD-10-CM | POA: Insufficient documentation

## 2019-10-07 DIAGNOSIS — Z01812 Encounter for preprocedural laboratory examination: Secondary | ICD-10-CM | POA: Insufficient documentation

## 2019-10-07 LAB — SARS CORONAVIRUS 2 (TAT 6-24 HRS): SARS Coronavirus 2: NEGATIVE

## 2019-10-07 NOTE — Progress Notes (Signed)

## 2019-10-09 NOTE — H&P (Signed)
PREOPERATIVE H&P  Chief Complaint: RIGHT SHOULDER ROTATOR CUFF REPAIR  HPI: Sarah Nicholson is a 49 y.o. female who is scheduled  SHOULDER ARTHROSCOPY WITH ROTATOR CUFF REPAIR AND SUBACROMIAL DECOMPRESSION.   Patient has a past medical history significant for ADD, hyperlipidemia, anxiety, depression.   She is a 49 year old who has been treated for [facility] arthrosis  for [facility] arthrosis for the last year.  She has had multiple injections. She is usually getting 2-3 months per injection but recently they are not lasting very long.  She is a Freight forwarder at [employer]. She is having trouble doing her work.  The pain can get up to a 10/10. She has night pain with sharp and dull pain occasionally. Right hand dominant at baseline.    Her symptoms are rated as moderate to severe, and have been worsening.  This is significantly impairing activities of daily living.    Please see clinic note for further details on this patient's care.    She has elected for surgical management.   Past Medical History:  Diagnosis Date  . ADD (attention deficit disorder) 06/14/2012  . Anxiety   . Bronchitis   . Complication of anesthesia 1987   states woke up during appedentomy  . Depression 06/14/2012  . Hepatic steatosis 11/13/2018   Hepatic steatosis with hepatomegaly seen on MRI abd on 11/2018  . History of leukocytosis 06/14/2012   Reports "WBC 32" decades ago with negative hematology specialist workup.   [name] Hyperlipidemia 06/14/2012  . Seasonal allergies 06/14/2012   Past Surgical History:  Procedure Laterality Date  . APPENDECTOMY    . bladder sling/mesh    . INGUINAL HERNIA REPAIR    . intrauterine ablation    . NASAL SINUS SURGERY    . TUBAL LIGATION     Social History   Socioeconomic History  . Marital status: Married    Spouse name: Not on file  . Number of children: Not on file  . Years of education: Not on file  . Highest education level: Not on file  Occupational History  . Not on file  Tobacco  Use  . Smoking status: Current Every Day Smoker    Packs/day: 0.50    Types: Cigarettes  . Smokeless tobacco: Never Used  . Tobacco comment: 10-20  Vaping Use  . Vaping Use: Never used  Substance and Sexual Activity  . Alcohol use: Yes    Comment: 1-2 drinks every 6 months  . Drug use: No  . Sexual activity: Not on file  Other Topics Concern  . Not on file  Social History Narrative  . Not on file   Social Determinants of Health   Financial Resource Strain:   . Difficulty of Paying Living Expenses:   Food Insecurity:   . Worried About Charity fundraiser in the Last Year:   . Arboriculturist in the Last Year:   Transportation Needs:   . Film/video editor (Medical):   [name] Lack of Transportation (Non-Medical):   Physical Activity:   . Days of Exercise per Week:   . Minutes of Exercise per Session:   Stress:   . Feeling of Stress :   Social Connections:   . Frequency of Communication with Friends and Family:   . Frequency of Social Gatherings with Friends and Family:   . Attends Religious Services:   . Active Member of Clubs or Organizations:   . Attends Archivist Meetings:   .  Marital Status:    Family History  Problem Relation Age of Onset  . Depression Mother   . Diabetes Father   . Hyperlipidemia Father   . Hypertension Father   . Depression Sister   . Depression Son   . Breast cancer Maternal Grandmother    Allergies  Allergen Reactions  . Nitrofurantoin Hives and Rash  . Other Hives    Preservative used in vaccinations  . Tetanus Toxoids Swelling    Pt states she was told the Tdap injection would have to be administered in the hospital  After discussion on 11/10 pt remembers being told it was thimersol in the tetanus 30+years ago that caused the swelling.    [name] Macrobid [Nitrofurantoin Macrocrystal] Hives  . Thimerosal Rash   Prior to Admission medications   Medication Sig Start Date End Date Taking? Authorizing Provider    amphetamine-dextroamphetamine (ADDERALL) 10 MG tablet Take 1 tablet (10 mg total) by mouth 2 (two) times daily. 02/04/19 10/02/19 Yes Alexander, Lanelle Bal, DO  clonazePAM (KLONOPIN) 0.5 MG tablet Take 1 tablet (0.5 mg total) by mouth 3 (three) times daily as needed for anxiety. 06/05/19  Yes Emeterio Reeve, DO  escitalopram (LEXAPRO) 20 MG tablet Take 1 tablet (20 mg total) by mouth daily. 07/29/19  Yes Emeterio Reeve, DO  EPINEPHRINE 0.3 mg/0.3 mL IJ SOAJ injection INJECT INTRAMUSCULARLY AS DIRECTED 08/20/18   Emeterio Reeve, DO  HYDROcodone-acetaminophen (NORCO/VICODIN) 5-325 MG tablet Take 1 tablet by mouth every 8 (eight) hours as needed for moderate pain. 08/30/19   Silverio Decamp, MD    ROS: All other systems have been reviewed and were otherwise negative with the exception of those mentioned in the HPI and as above.  Physical Exam: General: Alert, no acute distress Cardiovascular: No pedal edema Respiratory: No cyanosis, no use of accessory musculature GI: No organomegaly, abdomen is soft and non-tender Skin: No lesions in the area of chief complaint Neurologic: Sensation intact distally Psychiatric: Patient is competent for consent with normal mood and affect Lymphatic: No axillary or cervical lymphadenopathy  MUSCULOSKELETAL:  Right shoulder: She has a positive impingement.  She has AC tenderness to palpation with O'Brien's. She has 5-/5 supraspinatus strength. Her range of motion is full otherwise and symmetric to the contralateral side.  Imaging: X-rays demonstrate end stage arthrosis of the [facility] joint with significant bulky arthritis and a Type III acromion.  There is an overhanging lateral spur on the acromion. Glenohumeral joint appears to be normal.   Assessment: Right AC arthrosis with impingement and likely bicipital pathology   Plan: Plan for Procedure(s): SHOULDER ARTHROSCOPY WITH ROTATOR CUFF REPAIR AND SUBACROMIAL DECOMPRESSION  The risks benefits and  alternatives were discussed with the patient including but not limited to the risks of nonoperative treatment, versus surgical intervention including infection, bleeding, nerve injury,  blood clots, cardiopulmonary complications, morbidity, mortality, among others, and they were willing to proceed.   The patient acknowledged the explanation, agreed to proceed with the plan and consent was signed.   Operative Plan: Right shoulder scope with SAD, DCE, possible BT, possible RCR Discharge Medications: Standard DVT Prophylaxis: None Physical Therapy: Outpatient PT Special Discharge needs: [place], PA-C  10/09/2019 4:01 PM

## 2019-10-10 ENCOUNTER — Encounter (HOSPITAL_BASED_OUTPATIENT_CLINIC_OR_DEPARTMENT_OTHER): Payer: Self-pay | Admitting: Orthopaedic Surgery

## 2019-10-10 ENCOUNTER — Ambulatory Visit (HOSPITAL_BASED_OUTPATIENT_CLINIC_OR_DEPARTMENT_OTHER): Payer: 59 | Admitting: Anesthesiology

## 2019-10-10 ENCOUNTER — Ambulatory Visit (HOSPITAL_BASED_OUTPATIENT_CLINIC_OR_DEPARTMENT_OTHER)
Admission: RE | Admit: 2019-10-10 | Discharge: 2019-10-10 | Disposition: A | Discharge status: Home / Self Care | Payer: 59 | Attending: Orthopaedic Surgery | Admitting: Orthopaedic Surgery

## 2019-10-10 ENCOUNTER — Encounter (HOSPITAL_BASED_OUTPATIENT_CLINIC_OR_DEPARTMENT_OTHER)
Admission: RE | Disposition: A | Discharge status: Home / Self Care | Payer: Self-pay | Source: Home / Self Care | Attending: Orthopaedic Surgery

## 2019-10-10 ENCOUNTER — Other Ambulatory Visit: Payer: Self-pay

## 2019-10-10 DIAGNOSIS — F988 Other specified behavioral and emotional disorders with onset usually occurring in childhood and adolescence: Secondary | ICD-10-CM | POA: Insufficient documentation

## 2019-10-10 DIAGNOSIS — M19011 Primary osteoarthritis, right shoulder: Secondary | ICD-10-CM | POA: Insufficient documentation

## 2019-10-10 DIAGNOSIS — K760 Fatty (change of) liver, not elsewhere classified: Secondary | ICD-10-CM | POA: Diagnosis not present

## 2019-10-10 DIAGNOSIS — Z833 Family history of diabetes mellitus: Secondary | ICD-10-CM | POA: Diagnosis not present

## 2019-10-10 DIAGNOSIS — Z79899 Other long term (current) drug therapy: Secondary | ICD-10-CM | POA: Diagnosis not present

## 2019-10-10 DIAGNOSIS — M7541 Impingement syndrome of right shoulder: Secondary | ICD-10-CM | POA: Insufficient documentation

## 2019-10-10 DIAGNOSIS — Z887 Allergy status to serum and vaccine status: Secondary | ICD-10-CM | POA: Insufficient documentation

## 2019-10-10 DIAGNOSIS — Z803 Family history of malignant neoplasm of breast: Secondary | ICD-10-CM | POA: Insufficient documentation

## 2019-10-10 DIAGNOSIS — F329 Major depressive disorder, single episode, unspecified: Secondary | ICD-10-CM | POA: Diagnosis not present

## 2019-10-10 DIAGNOSIS — Z881 Allergy status to other antibiotic agents status: Secondary | ICD-10-CM | POA: Insufficient documentation

## 2019-10-10 DIAGNOSIS — E785 Hyperlipidemia, unspecified: Secondary | ICD-10-CM | POA: Insufficient documentation

## 2019-10-10 DIAGNOSIS — Z8349 Family history of other endocrine, nutritional and metabolic diseases: Secondary | ICD-10-CM | POA: Diagnosis not present

## 2019-10-10 DIAGNOSIS — F419 Anxiety disorder, unspecified: Secondary | ICD-10-CM | POA: Insufficient documentation

## 2019-10-10 DIAGNOSIS — F17210 Nicotine dependence, cigarettes, uncomplicated: Secondary | ICD-10-CM | POA: Insufficient documentation

## 2019-10-10 DIAGNOSIS — Z8249 Family history of ischemic heart disease and other diseases of the circulatory system: Secondary | ICD-10-CM | POA: Diagnosis not present

## 2019-10-10 DIAGNOSIS — Z888 Allergy status to other drugs, medicaments and biological substances status: Secondary | ICD-10-CM | POA: Diagnosis not present

## 2019-10-10 HISTORY — DX: Anxiety disorder, unspecified: F41.9

## 2019-10-10 HISTORY — PX: BICEPT TENODESIS: SHX5116

## 2019-10-10 SURGERY — TENODESIS, BICEPS
Anesthesia: General | Site: Shoulder | Laterality: Right

## 2019-10-10 MED ORDER — EPHEDRINE SULFATE 50 MG/ML IJ SOLN
INTRAMUSCULAR | Status: DC | PRN
  Administered 2019-10-10 (×2): 10 mg via INTRAVENOUS
  Administered 2019-10-10: 5 mg via INTRAVENOUS

## 2019-10-10 MED ORDER — SUGAMMADEX SODIUM 200 MG/2ML IV SOLN
INTRAVENOUS | Status: DC | PRN
  Administered 2019-10-10: 200 mg via INTRAVENOUS

## 2019-10-10 MED ORDER — ACETAMINOPHEN 500 MG PO TABS
1000.0000 mg | ORAL_TABLET | Freq: Three times a day (TID) | ORAL | 0 refills | Status: AC
Start: 2019-10-10 — End: 2019-10-24

## 2019-10-10 MED ORDER — MIDAZOLAM HCL 2 MG/2ML IJ SOLN
INTRAMUSCULAR | Status: AC
  Filled 2019-10-10: qty 2

## 2019-10-10 MED ORDER — ROCURONIUM BROMIDE 10 MG/ML (PF) SYRINGE
PREFILLED_SYRINGE | INTRAVENOUS | Status: AC
  Filled 2019-10-10: qty 10

## 2019-10-10 MED ORDER — EPINEPHRINE PF 1 MG/ML IJ SOLN
INTRAMUSCULAR | Status: AC
  Filled 2019-10-10: qty 1

## 2019-10-10 MED ORDER — BUPIVACAINE LIPOSOME 1.3 % IJ SUSP
INTRAMUSCULAR | Status: DC | PRN
  Administered 2019-10-10: 10 mL via PERINEURAL

## 2019-10-10 MED ORDER — CEFAZOLIN SODIUM-DEXTROSE 2-4 GM/100ML-% IV SOLN
INTRAVENOUS | Status: AC
  Filled 2019-10-10: qty 100

## 2019-10-10 MED ORDER — OXYCODONE HCL 5 MG PO TABS: ORAL_TABLET | ORAL | 0 refills | Status: AC

## 2019-10-10 MED ORDER — DIPHENHYDRAMINE HCL 50 MG/ML IJ SOLN
INTRAMUSCULAR | Status: DC | PRN
Start: 2019-10-10 — End: 2019-10-10
  Administered 2019-10-10: 6.25 mg via INTRAVENOUS

## 2019-10-10 MED ORDER — EPHEDRINE 5 MG/ML INJ
INTRAVENOUS | Status: AC
  Filled 2019-10-10: qty 10

## 2019-10-10 MED ORDER — DEXAMETHASONE SODIUM PHOSPHATE 10 MG/ML IJ SOLN
INTRAMUSCULAR | Status: AC
  Filled 2019-10-10: qty 1

## 2019-10-10 MED ORDER — ONDANSETRON HCL 4 MG/2ML IJ SOLN
INTRAMUSCULAR | Status: AC
  Filled 2019-10-10: qty 2

## 2019-10-10 MED ORDER — CEFAZOLIN SODIUM-DEXTROSE 2-4 GM/100ML-% IV SOLN: 2.0000 g | INTRAVENOUS | Status: DC

## 2019-10-10 MED ORDER — LACTATED RINGERS IV SOLN: INTRAVENOUS | Status: DC

## 2019-10-10 MED ORDER — SODIUM CHLORIDE 0.9 % IR SOLN
Status: DC | PRN
  Administered 2019-10-10: 6000 mL

## 2019-10-10 MED ORDER — MIDAZOLAM HCL 2 MG/2ML IJ SOLN: 0.5000 mg | Freq: Once | INTRAMUSCULAR | Status: DC

## 2019-10-10 MED ORDER — FENTANYL CITRATE (PF) 100 MCG/2ML IJ SOLN
INTRAMUSCULAR | Status: AC
  Filled 2019-10-10: qty 2

## 2019-10-10 MED ORDER — FENTANYL CITRATE (PF) 100 MCG/2ML IJ SOLN: 25.0000 ug | INTRAMUSCULAR | Status: DC | PRN

## 2019-10-10 MED ORDER — SUGAMMADEX SODIUM 500 MG/5ML IV SOLN
INTRAVENOUS | Status: AC
  Filled 2019-10-10: qty 5

## 2019-10-10 MED ORDER — MIDAZOLAM HCL 2 MG/2ML IJ SOLN
1.0000 mg | Freq: Once | INTRAMUSCULAR | Status: AC
  Administered 2019-10-10: 1 mg via INTRAVENOUS

## 2019-10-10 MED ORDER — PROMETHAZINE HCL 25 MG/ML IJ SOLN: 6.2500 mg | INTRAMUSCULAR | Status: DC | PRN

## 2019-10-10 MED ORDER — BUPIVACAINE-EPINEPHRINE (PF) 0.5% -1:200000 IJ SOLN
INTRAMUSCULAR | Status: DC | PRN
Start: 2019-10-10 — End: 2019-10-10
  Administered 2019-10-10: 15 mL via PERINEURAL

## 2019-10-10 MED ORDER — ONDANSETRON HCL 4 MG PO TABS: 4.0000 mg | ORAL_TABLET | Freq: Three times a day (TID) | ORAL | 1 refills | Status: AC | PRN

## 2019-10-10 MED ORDER — ACETAMINOPHEN 500 MG PO TABS
ORAL_TABLET | ORAL | Status: AC
  Filled 2019-10-10: qty 2

## 2019-10-10 MED ORDER — ACETAMINOPHEN 500 MG PO TABS
1000.0000 mg | ORAL_TABLET | Freq: Once | ORAL | Status: AC
  Administered 2019-10-10: 1000 mg via ORAL

## 2019-10-10 MED ORDER — LIDOCAINE 2% (20 MG/ML) 5 ML SYRINGE
INTRAMUSCULAR | Status: AC
  Filled 2019-10-10: qty 5

## 2019-10-10 MED ORDER — PHENYLEPHRINE 40 MCG/ML (10ML) SYRINGE FOR IV PUSH (FOR BLOOD PRESSURE SUPPORT)
PREFILLED_SYRINGE | INTRAVENOUS | Status: AC
  Filled 2019-10-10: qty 10

## 2019-10-10 MED ORDER — MELOXICAM 7.5 MG PO TABS
7.5000 mg | ORAL_TABLET | Freq: Every day | ORAL | 0 refills | Status: AC
Start: 2019-10-10 — End: 2019-11-09

## 2019-10-10 MED ORDER — FENTANYL CITRATE (PF) 100 MCG/2ML IJ SOLN
50.0000 ug | Freq: Once | INTRAMUSCULAR | Status: AC
  Administered 2019-10-10: 50 ug via INTRAVENOUS

## 2019-10-10 MED ORDER — DEXAMETHASONE SODIUM PHOSPHATE 4 MG/ML IJ SOLN
INTRAMUSCULAR | Status: DC | PRN
  Administered 2019-10-10: 5 mg via INTRAVENOUS

## 2019-10-10 MED ORDER — PROPOFOL 10 MG/ML IV BOLUS
INTRAVENOUS | Status: DC | PRN
  Administered 2019-10-10: 200 mg via INTRAVENOUS

## 2019-10-10 MED ORDER — DIPHENHYDRAMINE HCL 50 MG/ML IJ SOLN
INTRAMUSCULAR | Status: AC
  Filled 2019-10-10: qty 1

## 2019-10-10 MED ORDER — SUCCINYLCHOLINE CHLORIDE 200 MG/10ML IV SOSY
PREFILLED_SYRINGE | INTRAVENOUS | Status: AC
  Filled 2019-10-10: qty 10

## 2019-10-10 MED ORDER — LIDOCAINE HCL (CARDIAC) PF 100 MG/5ML IV SOSY
PREFILLED_SYRINGE | INTRAVENOUS | Status: DC | PRN
  Administered 2019-10-10: 100 mg via INTRAVENOUS

## 2019-10-10 MED ORDER — OXYCODONE HCL 5 MG PO TABS: 5.0000 mg | ORAL_TABLET | Freq: Once | ORAL | Status: DC | PRN

## 2019-10-10 MED ORDER — OXYCODONE HCL 5 MG/5ML PO SOLN: 5.0000 mg | Freq: Once | ORAL | Status: DC | PRN

## 2019-10-10 MED ORDER — FENTANYL CITRATE (PF) 100 MCG/2ML IJ SOLN
INTRAMUSCULAR | Status: DC | PRN
  Administered 2019-10-10: 100 ug via INTRAVENOUS

## 2019-10-10 MED ORDER — ROCURONIUM BROMIDE 100 MG/10ML IV SOLN
INTRAVENOUS | Status: DC | PRN
  Administered 2019-10-10: 60 mg via INTRAVENOUS

## 2019-10-10 SURGICAL SUPPLY — 65 items
AID PSTN UNV HD RSTRNT DISP (MISCELLANEOUS) ×2
ANCHOR SUT 1.8 FBRTK KNTLS 2SU (Anchor) ×8 IMPLANT
APL PRP STRL LF DISP 70% ISPRP (MISCELLANEOUS) ×2
BLADE EXCALIBUR 4.0MM X 13CM (MISCELLANEOUS) ×1
BLADE EXCALIBUR 4.0X13 (MISCELLANEOUS) ×3 IMPLANT
BLADE SURG 10 STRL SS (BLADE) IMPLANT
BURR OVAL 8 FLU 4.0MM X 13CM (MISCELLANEOUS)
BURR OVAL 8 FLU 4.0X13 (MISCELLANEOUS) IMPLANT
CANNULA 5.75X71 LONG (CANNULA) IMPLANT
CANNULA PASSPORT 5 (CANNULA) IMPLANT
CANNULA PASSPORT 5CM (CANNULA)
CANNULA PASSPORT BUTTON 10-40 (CANNULA) ×4 IMPLANT
CANNULA TWIST IN 8.25X7CM (CANNULA) IMPLANT
CHLORAPREP W/TINT 26 (MISCELLANEOUS) ×4 IMPLANT
CLOSURE STERI-STRIP 1/2X4 (GAUZE/BANDAGES/DRESSINGS) ×1
CLSR STERI-STRIP ANTIMIC 1/2X4 (GAUZE/BANDAGES/DRESSINGS) ×3 IMPLANT
COVER WAND RF STERILE (DRAPES) IMPLANT
DECANTER SPIKE VIAL GLASS SM (MISCELLANEOUS) IMPLANT
DISSECTOR 3.5MM X 13CM CVD (MISCELLANEOUS) IMPLANT
DISSECTOR 4.0MMX13CM CVD (MISCELLANEOUS) IMPLANT
DRAPE IMP U-DRAPE 54X76 (DRAPES) ×8 IMPLANT
DRAPE INCISE IOBAN 66X45 STRL (DRAPES) IMPLANT
DRAPE SHOULDER BEACH CHAIR (DRAPES) ×4 IMPLANT
DRSG PAD ABDOMINAL 8X10 ST (GAUZE/BANDAGES/DRESSINGS) ×4 IMPLANT
DW OUTFLOW CASSETTE/TUBE SET (MISCELLANEOUS) ×4 IMPLANT
GAUZE SPONGE 4X4 12PLY STRL (GAUZE/BANDAGES/DRESSINGS) ×4 IMPLANT
GLOVE BIO SURGEON STRL SZ 6.5 (GLOVE) ×6 IMPLANT
GLOVE BIO SURGEONS STRL SZ 6.5 (GLOVE) ×2
GLOVE BIOGEL PI IND STRL 6.5 (GLOVE) ×2 IMPLANT
GLOVE BIOGEL PI IND STRL 8 (GLOVE) ×2 IMPLANT
GLOVE BIOGEL PI INDICATOR 6.5 (GLOVE) ×2
GLOVE BIOGEL PI INDICATOR 8 (GLOVE) ×2
GLOVE ECLIPSE 8.0 STRL XLNG CF (GLOVE) ×4 IMPLANT
GOWN STRL REUS W/ TWL LRG LVL3 (GOWN DISPOSABLE) ×4 IMPLANT
GOWN STRL REUS W/TWL LRG LVL3 (GOWN DISPOSABLE) ×8
GOWN STRL REUS W/TWL XL LVL3 (GOWN DISPOSABLE) ×4 IMPLANT
KIT STABILIZATION SHOULDER (MISCELLANEOUS) ×4 IMPLANT
KIT STR SPEAR 1.8 FBRTK DISP (KITS) ×4 IMPLANT
LASSO CRESCENT QUICKPASS (SUTURE) IMPLANT
MANIFOLD NEPTUNE II (INSTRUMENTS) ×4 IMPLANT
NDL SAFETY ECLIPSE 18X1.5 (NEEDLE) ×2 IMPLANT
NEEDLE HYPO 18GX1.5 SHARP (NEEDLE) ×4
NEEDLE SCORPION MULTI FIRE (NEEDLE) IMPLANT
PACK ARTHROSCOPY DSU (CUSTOM PROCEDURE TRAY) ×4 IMPLANT
PACK BASIN DAY SURGERY FS (CUSTOM PROCEDURE TRAY) ×4 IMPLANT
PAD ORTHO SHOULDER 7X19 LRG (SOFTGOODS) ×4 IMPLANT
PORT APPOLLO RF 90DEGREE MULTI (SURGICAL WAND) ×4 IMPLANT
RESTRAINT HEAD UNIVERSAL NS (MISCELLANEOUS) ×4 IMPLANT
SHEET MEDIUM DRAPE 40X70 STRL (DRAPES) ×4 IMPLANT
SLEEVE SCD COMPRESS KNEE MED (MISCELLANEOUS) ×4 IMPLANT
SLING ARM FOAM STRAP LRG (SOFTGOODS) IMPLANT
SUT FIBERWIRE #2 38 T-5 BLUE (SUTURE)
SUT MNCRL AB 4-0 PS2 18 (SUTURE) ×4 IMPLANT
SUT PDS AB 1 CT  36 (SUTURE)
SUT PDS AB 1 CT 36 (SUTURE) IMPLANT
SUT TIGER TAPE 7 IN WHITE (SUTURE) IMPLANT
SUTURE FIBERWR #2 38 T-5 BLUE (SUTURE) IMPLANT
SUTURE TAPE TIGERLINK 1.3MM BL (SUTURE) IMPLANT
SUTURETAPE TIGERLINK 1.3MM BL (SUTURE)
SYR 5ML LL (SYRINGE) ×4 IMPLANT
TAPE FIBER 2MM 7IN #2 BLUE (SUTURE) IMPLANT
TOWEL GREEN STERILE FF (TOWEL DISPOSABLE) ×4 IMPLANT
TUBE CONNECTING 20'X1/4 (TUBING) ×1
TUBE CONNECTING 20X1/4 (TUBING) ×3 IMPLANT
TUBING ARTHROSCOPY IRRIG 16FT (MISCELLANEOUS) ×4 IMPLANT

## 2019-10-10 NOTE — Transfer of Care (Signed)
Immediate Anesthesia Transfer of Care Note  Patient: Sarah Nicholson  Procedure(s) Performed: SHOULDER ARTHROSCOPY WITH BICEPS TENDON REPAIR (Right Shoulder) SHOULDER ARTHROSCOPY WITH SUBACROMIAL DECOMPRESSION AND DISTAL CLAVICLE EXCISION (Right Shoulder)  Patient Location: PACU  Anesthesia Type:GA combined with regional for post-op pain  Level of Consciousness: awake, alert , oriented and drowsy  Airway & Oxygen Therapy: Patient Spontanous Breathing and Patient connected to face mask oxygen  Post-op Assessment: Report given to RN and Post -op Vital signs reviewed and stable  Post vital signs: Reviewed and stable  Last Vitals:  Vitals Value Taken Time  BP 126/72 10/10/19 0912  Temp    Pulse 97 10/10/19 0914  Resp 19 10/10/19 0914  SpO2 98 % 10/10/19 0914  Vitals shown include unvalidated device data.  Last Pain:  Vitals:   10/10/19 0641  TempSrc: Oral  PainSc: 1       Patients Stated Pain Goal: 1 (02/01/61 4469)  Complications: No complications documented.

## 2019-10-10 NOTE — Op Note (Signed)
Orthopaedic Surgery Operative Note (CSN: [id number])  Sarah Nicholson  09-22-1970 Date of Surgery: 10/10/2019   Diagnoses:  Right shoulder pain  Procedure: Arthroscopic extensive debridement Arthroscopic subacromial decompression Arthroscopic biceps tenodesis Arthroscopic distal clavicle excision   Operative Finding Exam under anesthesia: Full motion, no instability Articular space: No loose bodies, capsule intact, anterior labral fraying debrided Chondral surfaces:Intact, no sign of chondral degeneration on the glenoid or humeral head Biceps: type 2 slap tear Subscapularis: normal Superior Cuff:normal Bursal side:  Normal, intact cuff throughout  Successful completion of the planned procedure.  Patient had a good biceps repair, no other obvious pathology noted.   Post-operative plan: The patient will be non-weightbearing in a sling x4 weeks.  The patient will be discharged home.  DVT prophylaxis not indicated in ambulatory upper extremity patient without known risk factors.   Pain control with PRN pain medication preferring oral medicines.  Follow up plan will be scheduled in approximately 7 days for incision check and XR.  Post-Op Diagnosis: Same Surgeons:Primary: Hiram Gash, MD Assistants:Caroline McBane PA-C Location: [facility] ROOM 6 Anesthesia: General with Exparel interscalene block Antibiotics: Ancef 2 g Tourniquet time: None Estimated Blood Loss: Minimal Complications: None Specimens: None Implants: Implant Name Type Inv. Item Serial No. Manufacturer Lot No. LRB No. Used Action  ANCHOR SUT 1.8 FIBERTAK 2 SUT - [id number] Anchor ANCHOR SUT 1.8 FIBERTAK 2 SUT  [employer] [id number] Right 1 Implanted  ANCHOR SUT 1.8 FIBERTAK 2 SUT - [id number] Anchor ANCHOR SUT 1.8 FIBERTAK 2 SUT  [employer] [id number] Right 1 Implanted    Indications for Surgery:   Sarah Nicholson is a 49 y.o. female with continued shoulder pain refractory to nonoperative measures for  extended period of time.    The risks and benefits were explained at length including but not limited to continued pain, cuff failure, biceps tenodesis failure, stiffness, need for further surgery and infection.   Procedure:   Patient was correctly identified in the preoperative holding area and operative site marked.  Patient brought to OR and positioned beachchair on an [place] table ensuring that all bony prominences were padded and the head was in an appropriate location.  Anesthesia was induced and the operative shoulder was prepped and draped in the usual sterile fashion.  Timeout was called preincision.  A standard posterior viewing portal was made after localizing the portal with a spinal needle.  An anterior accessory portal was also made.  After clearing the articular space the camera was positioned in the subacromial space.  Findings above.    Extensive debridement was performed of the anterior interval tissue, labral fraying and the bursa.  Arthroscopic Rotator Cuff Repair: Tuberosity was prepared with a burr to a bleeding bed.  Following completion of the above we placed 2 4.7 Swivelock anchor loaded with a tape at inserted at the medial articular margin and an scorpion suture passing device, shuttled  sutures medially in a horizontal mattress suture configuration.  We then tied using arthroscopic knot tying techniques  each suture to its partner reducing the tendon at the prepared insertion site.  The fiber tape was not tied. With a medial row suture limbs then incorporated, 2 anteriorly and  2 posteriorly, into each of two 4.75 PEEK SwiveLock anchors, each placed 8 to 10 mm below the tip of the tuberosity and spanning anterior-posterior width of the tear with care to avoid over tensioning.   Biceps tenodesis: We marked the tendon and then performed a tenotomy  and debridement of the stump in the articular space. We then identified the biceps tendon in its groove suprapec with the arthroscope  in the lateral portal taking care to move from lateral to medial to avoid injury to the subscapularis. At that point we unroofed the tendon itself and mobilized it. An accessory anterior portal was made in line with the tendon and we grasped it from the anterior superior portal and worked from the accessory anterior portal. Two Fibertak 1.55mm knotless anchors were placed in the groove and the tendon was secured in a luggage loop style fashion with a pass of the limb of suture through the tendon using a scorpion device to avoid pull-through.  Repair was completed with good tension on the tendon.  Residual stump of the tendon was removed after being resected with a RF ablator.  Distal Clavicle resection:  The scope was placed in the subacromial space from the posterior portal.  A hemostat was placed through the anterior portal and we spread at the [facility] joint.  A burr was then inserted and 10 mm of distal clavicle was resected taking care to avoid damage to the capsule around the joint and avoiding overhanging bone posteriorly.    The incisions were closed with absorbable monocryl and steri strips.  A sterile dressing was placed along with a sling. The patient was awoken from general anesthesia and taken to the PACU in stable condition without complication.   Sarah Chapel, PA-C, present and scrubbed throughout the case, critical for completion in a timely fashion, and for retraction, instrumentation, closure.

## 2019-10-10 NOTE — Anesthesia Procedure Notes (Signed)
Procedure Name: Intubation Date/Time: 10/10/2019 8:05 AM Performed by: Willa Frater, CRNA Pre-anesthesia Checklist: Patient identified, Emergency Drugs available, Suction available and Patient being monitored Patient Re-evaluated:Patient Re-evaluated prior to induction Oxygen Delivery Method: Circle system utilized Preoxygenation: Pre-oxygenation with 100% oxygen Induction Type: IV induction Ventilation: Mask ventilation without difficulty Grade View: Grade I Tube type: Oral Tube size: 7.0 mm Number of attempts: 1 Airway Equipment and Method: Stylet and Oral airway Placement Confirmation: ETT inserted through vocal cords under direct vision,  positive ETCO2 and breath sounds checked- equal and bilateral Secured at: 22 cm Tube secured with: Tape Dental Injury: Teeth and Oropharynx as per pre-operative assessment

## 2019-10-10 NOTE — Progress Notes (Signed)
Assisted Dr. Daiva Huge with right, ultrasound guided, interscalene  block. Side rails up, monitors on throughout procedure. See vital signs in flow sheet. Tolerated Procedure well.

## 2019-10-10 NOTE — Interval H&P Note (Signed)
All questions answered. Patient does not have an MRI and we are addressing any intraarticular pathology that we see. She understands this

## 2019-10-10 NOTE — Discharge Instructions (Signed)
Post Anesthesia Home Care Instructions  Activity: Get plenty of rest for the remainder of the day. A responsible individual must stay with you for 24 hours following the procedure.  For the next 24 hours, DO NOT: -Drive a car -Paediatric nurse -Drink alcoholic beverages -Take any medication unless instructed by your physician -Make any legal decisions or sign important papers.  Meals: Start with liquid foods such as gelatin or soup. Progress to regular foods as tolerated. Avoid greasy, spicy, heavy foods. If nausea and/or vomiting occur, drink only clear liquids until the nausea and/or vomiting subsides. Call your physician if vomiting continues.  Special Instructions/Symptoms: Your throat may feel dry or sore from the anesthesia or the breathing tube placed in your throat during surgery. If this causes discomfort, gargle with warm salt water. The discomfort should disappear within 24 hours.  If you had a scopolamine patch placed behind your ear for the management of post- operative nausea and/or vomiting:  1. The medication in the patch is effective for 72 hours, after which it should be removed.  Wrap patch in a tissue and discard in the trash. Wash hands thoroughly with soap and water. 2. You may remove the patch earlier than 72 hours if you experience unpleasant side effects which may include dry mouth, dizziness or visual disturbances. 3. Avoid touching the patch. Wash your hands with soap and water after contact with the patch.    Regional Anesthesia Blocks  1. Numbness or the inability to move the "blocked" extremity may last from 3-48 hours after placement. The length of time depends on the medication injected and your individual response to the medication. If the numbness is not going away after 48 hours, call your surgeon.  2. The extremity that is blocked will need to be protected until the numbness is gone and the  Strength has returned. Because you cannot feel it, you will  need to take extra care to avoid injury. Because it may be weak, you may have difficulty moving it or using it. You may not know what position it is in without looking at it while the block is in effect.  3. For blocks in the legs and feet, returning to weight bearing and walking needs to be done carefully. You will need to wait until the numbness is entirely gone and the strength has returned. You should be able to move your leg and foot normally before you try and bear weight or walk. You will need someone to be with you when you first try to ensure you do not fall and possibly risk injury.  4. Bruising and tenderness at the needle site are common side effects and will resolve in a few days.  5. Persistent numbness or new problems with movement should be communicated to the surgeon or the [place] [phone] [place] [phone]).  Information for Discharge Teaching: EXPAREL (bupivacaine liposome injectable suspension)   Your surgeon or anesthesiologist gave you EXPAREL(bupivacaine) to help control your pain after surgery.   EXPAREL is a local anesthetic that provides pain relief by numbing the tissue around the surgical site.  EXPAREL is designed to release pain medication over time and can control pain for up to 72 hours.  Depending on how you respond to EXPAREL, you may require less pain medication during your recovery.  Possible side effects:  Temporary loss of sensation or ability to move in the area where bupivacaine was injected.  Nausea, vomiting, constipation  Rarely, numbness and  tingling in your mouth or lips, lightheadedness, or anxiety may occur.  Call your doctor right away if you think you may be experiencing any of these sensations, or if you have other questions regarding possible side effects.  Follow all other discharge instructions given to you by your surgeon or nurse. Eat a healthy diet and drink plenty of water or other  fluids.  If you return to the hospital for any reason within 96 hours following the administration of EXPAREL, it is important for health care providers to know that you have received this anesthetic. A teal colored band has been placed on your arm with the date, time and amount of EXPAREL you have received in order to alert and inform your health care providers. Please leave this armband in place for the full 96 hours following administration, and then you may remove the band.   *May have Tylenol at 1pm today

## 2019-10-10 NOTE — Anesthesia Postprocedure Evaluation (Signed)
Anesthesia Post Note  Patient: Sarah Nicholson  Procedure(s) Performed: BICEPS TENODESIS (Right Shoulder) SHOULDER ARTHROSCOPY WITH SUBACROMIAL DECOMPRESSION AND DISTAL CLAVICLE EXCISION (Right Shoulder)     Patient location during evaluation: PACU Anesthesia Type: General Level of consciousness: awake and alert and oriented Pain management: pain level controlled Vital Signs Assessment: post-procedure vital signs reviewed and stable Respiratory status: spontaneous breathing, nonlabored ventilation and respiratory function stable Cardiovascular status: blood pressure returned to baseline Postop Assessment: no apparent nausea or vomiting Anesthetic complications: no   No complications documented.  Last Vitals:  Vitals:   10/10/19 0930 10/10/19 1000  BP: (!) 140/66 (!) 126/63  Pulse: 77 78  Resp: 22 16  Temp:  36.7 C  SpO2: 95% 95%    Last Pain:  Vitals:   10/10/19 1000  TempSrc:   PainSc: 0-No pain                 Brennan Bailey

## 2019-10-10 NOTE — Anesthesia Procedure Notes (Signed)
Anesthesia Regional Block: Interscalene brachial plexus block   Pre-Anesthetic Checklist: ,, timeout performed, Correct Patient, Correct Site, Correct Laterality, Correct Procedure, Correct Position, site marked, Risks and benefits discussed, pre-op evaluation,  At surgeon's request and post-op pain management  Laterality: Right  Prep: Maximum Sterile Barrier Precautions used, chloraprep       Needles:  Injection technique: Single-shot  Needle Type: Echogenic Stimulator Needle     Needle Length: 9cm  Needle Gauge: 22     Additional Needles:   Procedures:,,,, ultrasound used (permanent image in chart),,,,  Narrative:  Start time: 10/10/2019 7:50 AM End time: 10/10/2019 7:53 AM Injection made incrementally with aspirations every 5 mL.  Performed by: Personally  Anesthesiologist: Brennan Bailey, MD  Additional Notes: Risks, benefits, and alternative discussed. Patient gave consent for procedure. Patient prepped and draped in sterile fashion. Sedation administered, patient remains easily responsive to voice. Relevant anatomy identified with ultrasound guidance. Local anesthetic given in 5cc increments with no signs or symptoms of intravascular injection. No pain or paraesthesias with injection. Patient monitored throughout procedure with signs of LAST or immediate complications. Tolerated well. Ultrasound image placed in chart.  Tawny Asal, MD

## 2019-10-10 NOTE — Anesthesia Preprocedure Evaluation (Addendum)
Anesthesia Evaluation  Patient identified by MRN, date of birth, ID band Patient awake    Reviewed: Allergy & Precautions, NPO status , Patient's Chart, lab work & pertinent test results  History of Anesthesia Complications Negative for: history of anesthetic complications  Airway Mallampati: II  TM Distance: >3 FB Neck ROM: Full    Dental  (+) Missing,    Pulmonary Current Smoker and Patient abstained from smoking.,    Pulmonary exam normal        Cardiovascular negative cardio ROS Normal cardiovascular exam     Neuro/Psych Anxiety Depression negative neurological ROS     GI/Hepatic negative GI ROS, Neg liver ROS,   Endo/Other  negative endocrine ROS  Renal/GU negative Renal ROS  negative genitourinary   Musculoskeletal  (+) Arthritis , RIGHT SHOULDER ROTATOR CUFF REPAIR   Abdominal   Peds  Hematology negative hematology ROS (+)   Anesthesia Other Findings Day of surgery medications reviewed with patient.  Reproductive/Obstetrics negative OB ROS                            Anesthesia Physical Anesthesia Plan  ASA: II  Anesthesia Plan: General   Post-op Pain Management: GA combined w/ Regional for post-op pain   Induction: Intravenous  PONV Risk Score and Plan: 3 and Treatment may vary due to age or medical condition, Ondansetron, Dexamethasone and Midazolam  Airway Management Planned: Oral ETT  Additional Equipment: None  Intra-op Plan:   Post-operative Plan: Extubation in OR  Informed Consent: I have reviewed the patients History and Physical, chart, labs and discussed the procedure including the risks, benefits and alternatives for the proposed anesthesia with the patient or authorized representative who has indicated his/her understanding and acceptance.     Dental advisory given  Plan Discussed with: CRNA  Anesthesia Plan Comments:        Anesthesia Quick  Evaluation

## 2019-10-11 ENCOUNTER — Encounter (HOSPITAL_BASED_OUTPATIENT_CLINIC_OR_DEPARTMENT_OTHER): Payer: Self-pay | Admitting: Orthopaedic Surgery

## 2019-10-25 ENCOUNTER — Other Ambulatory Visit: Payer: Self-pay

## 2019-10-25 ENCOUNTER — Ambulatory Visit (INDEPENDENT_AMBULATORY_CARE_PROVIDER_SITE_OTHER): Payer: 59 | Admitting: Rehabilitative and Restorative Service Providers"

## 2019-10-25 ENCOUNTER — Other Ambulatory Visit: Payer: Self-pay | Admitting: Osteopathic Medicine

## 2019-10-25 ENCOUNTER — Encounter: Payer: Self-pay | Admitting: Rehabilitative and Restorative Service Providers"

## 2019-10-25 DIAGNOSIS — R293 Abnormal posture: Secondary | ICD-10-CM | POA: Diagnosis not present

## 2019-10-25 DIAGNOSIS — M25511 Pain in right shoulder: Secondary | ICD-10-CM

## 2019-10-25 DIAGNOSIS — R29898 Other symptoms and signs involving the musculoskeletal system: Secondary | ICD-10-CM

## 2019-10-25 DIAGNOSIS — F410 Panic disorder [episodic paroxysmal anxiety] without agoraphobia: Secondary | ICD-10-CM

## 2019-10-25 DIAGNOSIS — M6281 Muscle weakness (generalized): Secondary | ICD-10-CM | POA: Diagnosis not present

## 2019-10-25 NOTE — Therapy (Signed)
[facility] [place], [place], [address] Phone: [phone]   Fax:  [phone]  Physical Therapy Evaluation  Patient Details  Name: Sarah Nicholson MRN: [id number] Date of Birth: 07/01/1970 Referring Provider (PT): Dr Ophelia Charter    Encounter Date: 10/25/2019   PT End of Session - 10/25/19 1516    Visit Number 1    Number of Visits 24    Date for PT Re-Evaluation 01/10/20    PT Start Time 0845    PT Stop Time 0933    PT Time Calculation (min) 48 min    Activity Tolerance Patient tolerated treatment well           Past Medical History:  Diagnosis Date  . ADD (attention deficit disorder) 06/14/2012  . Anxiety   . Bronchitis   . Complication of anesthesia 1987   states woke up during appedentomy  . Depression 06/14/2012  . Hepatic steatosis 11/13/2018   Hepatic steatosis with hepatomegaly seen on MRI abd on 11/2018  . History of leukocytosis 06/14/2012   Reports "WBC 32" decades ago with negative hematology specialist workup.   [name] Hyperlipidemia 06/14/2012  . Seasonal allergies 06/14/2012    Past Surgical History:  Procedure Laterality Date  . APPENDECTOMY    . BICEPT TENODESIS Right 10/10/2019   Procedure: BICEPS TENODESIS;  Surgeon: Hiram Gash, MD;  Location: [facility];  Service: Orthopedics;  Laterality: Right;  . bladder sling/mesh    . INGUINAL HERNIA REPAIR    . intrauterine ablation    . NASAL SINUS SURGERY    . TUBAL LIGATION      There were no vitals filed for this visit.    Subjective Assessment - 10/25/19 0851    Subjective Patient reports that she has had pain and arthritic changes for the past 2 yrs. Underwent Rt shoulder arthroscopic extensive debridement with biceps tenodesis; SAD; DCR 10/10/19. No post op complications.    Pertinent History unremarkable per pt report    Patient Stated Goals move arm; use arm for normal functional and work activities    Currently  in Pain? Yes    Pain Score 3     Pain Location Shoulder    Pain Orientation Right    Pain Descriptors / Indicators Dull    Pain Type Surgical pain    Pain Onset More than a month ago    Pain Frequency Intermittent    Aggravating Factors  moving arm; sudden movements    Pain Relieving Factors ice; meds              [facility] PT Assessment - 10/25/19 0001      Assessment   Medical Diagnosis Rt biceps tenodesis; SAD; DCR     Referring Provider (PT) Dr Ophelia Charter     Onset Date/Surgical Date 10/10/19    Hand Dominance Right    Next MD Visit 11/10/19    Prior Therapy yes PT for shd       Precautions   Precautions Other (comment)    Precaution Comments see protocol       Balance Screen   Has the patient fallen in the past 6 months No    Has the patient had a decrease in activity level because of a fear of falling?  No    Is the patient reluctant to leave their home because of a fear of falling?  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; reaching overhead     Leisure yard work; gardening; household chores; hiking       Observation/Other Assessments   Observations Rt UE in abduction sling     Focus on Therapeutic Outcomes (FOTO)  96% limitation       Observation/Other Assessments-Edema    Edema --   edema shoulder to arm      Sensation   Additional Comments WFL's per pt report       Posture/Postural Control   Posture Comments head forward shoulders rounded and elevated; scapulae abducted and rotated along the thoracic wall       AROM   Overall AROM Comments cervical ROM tight > Lt lateral flexion and rotation; Rt elbow extension (-) 12 deg; forearm supination and pronation tight at end ranges       Strength   Overall Strength Comments Lt UE strength  observed to be WNL's for functional activities; Rt UE strength not assessed       Palpation   Palpation comment muscular tightness noted through the Rt upper quarter                       Objective measurements completed on examination: See above findings.       [facility] Adult PT Treatment/Exercise - 10/25/19 0001      Neuro Re-ed    Neuro Re-ed Details  initiated postural correction with verbal cues to engage posterior shoulder girdle musculature      Shoulder Exercises: Seated   Other Seated Exercises scap squeeze 10 sec hold x 5 reps     Other Seated Exercises axial extension 5 sec x 5 reps; lateral cervical flexion 5 sec x 3 reps; cervical rotation 5 sec x 3 reps       Shoulder Exercises: ROM/Strengthening   Pendulum Rt UE hanging to 90 deg - moving body to move shd as tolerated                   PT Education - 10/25/19 0925    Education Details POC HEP TENS    Person(s) Educated Patient    Methods Explanation;Demonstration;Tactile cues;Verbal cues;Handout    Comprehension Verbalized understanding;Returned demonstration;Verbal cues required;Tactile cues required            PT Short Term Goals - 10/25/19 1619      PT SHORT TERM GOAL #1   Title Patient independent in initial HEP    Time 6    Period Weeks    Status New    Target Date 12/06/19      PT SHORT TERM GOAL #2   Title Increase PROM Rt shoulder as protocol allows    Time 6    Period Weeks    Status New    Target Date 12/06/19      PT SHORT TERM GOAL #3   Title Improve posture and alignment with patient demonstrating improved upright posture with posterior shoulder girdle engaged    Time 6    Period Weeks    Status New    Target Date 12/06/19             PT Long Term Goals - 10/25/19 1626      PT LONG TERM  GOAL #1   Title AROM Rt shoulder to WFL's and pain free    Time 12    Period Weeks    Status New    Target Date 01/10/20      PT LONG TERM GOAL #2   Title 4+/5 to  5/5 strength Rt shoulder    Time 12    Period Weeks    Status New    Target Date 01/10/20      PT LONG TERM GOAL #3   Title patient reports that she has returned to normal functional activities    Time 12    Period Weeks    Status New    Target Date 01/10/20      PT LONG TERM GOAL #4   Title Independent in HEP    Time 12    Period Weeks    Status New    Target Date 01/10/20      PT LONG TERM GOAL #5   Title Improve FOTO to </= 44% limitation    Time 12    Period Weeks    Status New    Target Date 01/10/20                  Plan - 10/25/19 1519    Clinical Impression Statement Patient presents s/p Rt shoulder arthroscopic proceedure for extensive debridement; biceps tenodesis; DCR; SAD 10/10/19 with no post op complications. She reports ~ 1 year history of Rt shoulder pain with treatment including injections and therapy.           Patient will benefit from skilled therapeutic intervention in order to improve the following deficits and impairments:     Visit Diagnosis: Acute pain of right shoulder - Plan: PT plan of care cert/re-cert  Abnormal posture - Plan: PT plan of care cert/re-cert  Muscle weakness (generalized) - Plan: PT plan of care cert/re-cert  Other symptoms and signs involving the musculoskeletal system - Plan: PT plan of care cert/re-cert     Problem List Patient Active Problem List   Diagnosis Date Noted  . 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 Hepatic steatosis 11/13/2018  . Tobacco use 07/18/2018  . Sun-induced skin changes, keratosis 08/08/2017  . Subcutaneous cyst 08/08/2017  . Irritable bowel syndrome (IBS) 05/04/2017  . Generalized anxiety disorder 03/16/2016  . Social anxiety disorder 03/16/2016  . Major depressive disorder, recurrent episode, moderate ([place]) 03/16/2016  . Right inguinal hernia 07/14/2015  . Hallux valgus 12/09/2014  . Acute headache 07/31/2014  . Chest pain 07/31/2014  . Hypotension  07/31/2014  . Other and unspecified ovarian cyst 06/25/2013  . Left tennis elbow 06/25/2013  . Left breast mass 06/27/2012  . Microscopic hematuria 06/27/2012  . Hyperlipidemia 06/14/2012  . Prediabetes 06/14/2012  . History of leukocytosis 06/14/2012  . Seasonal allergies 06/14/2012  . Depression 06/14/2012  . ADD (attention deficit disorder) 06/14/2012    Deiondre Harrower Nilda Simmer PT, MPH  10/25/2019, 4:34 PM  [facility] [place], [place], [address] Phone: [phone]   Fax:  [phone]  Name: Sarah Nicholson MRN: [id number] Date of Birth: 1970/07/27

## 2019-10-25 NOTE — Patient Instructions (Signed)
Access Code: [email or web address]: [email or web address]: 08/13/2021Prepared by: Emmamarie Kluender HoltExercises  Seated Cervical Retraction - 2 x daily - 7 x weekly - 1-2 sets - 5-10 reps - 10 sec hold  Seated Cervical Sidebending AROM - 2 x daily - 7 x weekly - 1 sets - 5 reps - 5-10 sec hold  Seated Cervical Rotation AROM - 2 x daily - 7 x weekly - 1 sets - 5 reps - 2-3 sec hold  Seated Scapular Retraction - 2 x daily - 7 x weekly - 1-2 sets - 10 reps - 10 sec hold  Seated Forearm Pronation and Supination AROM - 3 x daily - 7 x weekly - 1 sets - 10 reps - 2-3 sec hold  Seated Elbow Flexion and Extension AROM - 3 x daily - 7 x weekly - 1 sets - 10 reps - 2-3 sec hold  Circular Shoulder Pendulum with Table Support - 3 x daily - 7 x weekly - 1 sets - 20-30 reps Patient Education  TENS Unit

## 2019-10-25 NOTE — Telephone Encounter (Signed)
Last refill 06/05/2019 Last Ov- 06/05/2019

## 2019-10-29 ENCOUNTER — Encounter: Payer: Self-pay | Admitting: Rehabilitative and Restorative Service Providers"

## 2019-10-29 ENCOUNTER — Ambulatory Visit (INDEPENDENT_AMBULATORY_CARE_PROVIDER_SITE_OTHER): Payer: 59 | Admitting: Rehabilitative and Restorative Service Providers"

## 2019-10-29 ENCOUNTER — Other Ambulatory Visit: Payer: Self-pay

## 2019-10-29 DIAGNOSIS — R293 Abnormal posture: Secondary | ICD-10-CM

## 2019-10-29 DIAGNOSIS — M6281 Muscle weakness (generalized): Secondary | ICD-10-CM | POA: Diagnosis not present

## 2019-10-29 DIAGNOSIS — R29898 Other symptoms and signs involving the musculoskeletal system: Secondary | ICD-10-CM | POA: Diagnosis not present

## 2019-10-29 DIAGNOSIS — M25511 Pain in right shoulder: Secondary | ICD-10-CM

## 2019-10-29 NOTE — Therapy (Addendum)
[place], [place], [address] Phone: [phone]   Fax:  [phone]  Physical Therapy Treatment  Patient Details  Name: Sarah Nicholson MRN: [id number] Date of Birth: [birth year] Referring Provider (PT): Dr Ophelia Charter    Encounter Date: 10/29/2019   PT End of Session - 10/29/19 0850    Visit Number 2    Number of Visits 24    Date for PT Re-Evaluation 01/10/20    PT Start Time 0848    PT Stop Time 0930    PT Time Calculation (min) 42 min    Activity Tolerance Patient tolerated treatment well           Past Medical History:  Diagnosis Date  . ADD (attention deficit disorder) 06/14/2012  . Anxiety   . Bronchitis   . Complication of anesthesia 1987   states woke up during appedentomy  . Depression 06/14/2012  . Hepatic steatosis 11/13/2018   Hepatic steatosis with hepatomegaly seen on MRI abd on 11/2018  . History of leukocytosis 06/14/2012   Reports "WBC 32" decades ago with negative hematology specialist workup.   [name] Hyperlipidemia 06/14/2012  . Seasonal allergies 06/14/2012    Past Surgical History:  Procedure Laterality Date  . APPENDECTOMY    . BICEPT TENODESIS Right 10/10/2019   Procedure: BICEPS TENODESIS;  Surgeon: Hiram Gash, MD;  Location: [facility];  Service: Orthopedics;  Laterality: Right;  . bladder sling/mesh    . INGUINAL HERNIA REPAIR    . intrauterine ablation    . NASAL SINUS SURGERY    . TUBAL LIGATION      There were no vitals filed for this visit.   Subjective Assessment - 10/29/19 0851    Subjective Did OK with exercises    Currently in Pain? Yes    Pain Score 2     Pain Location Shoulder    Pain Descriptors / Indicators Dull    Pain Type Surgical pain              [facility] PT Assessment - 10/29/19 0001      Assessment   Medical Diagnosis Rt biceps tenodesis; SAD; DCR     Referring Provider (PT) Dr Ophelia Charter     Onset Date/Surgical  Date 10/10/19    Hand Dominance Right    Next MD Visit 11/10/19    Prior Therapy yes PT for shd       PROM   Right Shoulder Flexion 85 Degrees    Right Shoulder ABduction 90 Degrees   scaption   Right Shoulder External Rotation --   neutral with elbow flexed                         [facility] Adult PT Treatment/Exercise - 10/29/19 0001      Shoulder Exercises: Seated   Other Seated Exercises scap squeeze 10 sec hold x 5 reps     Other Seated Exercises axial extension 5 sec x 5 reps; lateral cervical flexion 5 sec x 3 reps; cervical rotation 5 sec x 3 reps       Shoulder Exercises: ROM/Strengthening   Pendulum Rt UE hanging to 90 deg - moving body to move shd as tolerated       Electrical Stimulation   Electrical Stimulation Location Rt shoulder   10 min    Electrical Stimulation Action TENS     Electrical Stimulation Parameters to tolerance  Electrical Stimulation Goals Pain;Tone      Vasopneumatic   Number Minutes Vasopneumatic  8 minutes    Vasopnuematic Location  Shoulder   Rt   Vasopneumatic Pressure Low    Vasopneumatic Temperature  34 to 50 - difficulty with cold       Manual Therapy   Manual therapy comments pt sitting     Joint Mobilization gentle GH movement comma     Soft tissue mobilization through the upper trap; posterior shoulder girdle; pecs; deltoid; biceps to elbow - to pt tolerance min to mod pressure     Passive ROM PROM Rt shoudler flexion; scaptioin; ER to neutral                     PT Short Term Goals - 10/25/19 1619      PT SHORT TERM GOAL #1   Title Patient independent in initial HEP    Time 6    Period Weeks    Status New    Target Date 12/06/19      PT SHORT TERM GOAL #2   Title Increase PROM Rt shoulder as protocol allows    Time 6    Period Weeks    Status New    Target Date 12/06/19      PT SHORT TERM GOAL #3   Title Improve posture and alignment with patient demonstrating improved upright posture with  posterior shoulder girdle engaged    Time 6    Period Weeks    Status New    Target Date 12/06/19             PT Long Term Goals - 10/25/19 1626      PT LONG TERM GOAL #1   Title AROM Rt shoulder to WFL's and pain free    Time 12    Period Weeks    Status New    Target Date 01/10/20      PT LONG TERM GOAL #2   Title 4+/5 to 5/5 strength Rt shoulder    Time 12    Period Weeks    Status New    Target Date 01/10/20      PT LONG TERM GOAL #3   Title patient reports that she has returned to normal functional activities    Time 12    Period Weeks    Status New    Target Date 01/10/20      PT LONG TERM GOAL #4   Title Independent in HEP    Time 12    Period Weeks    Status New    Target Date 01/10/20      PT LONG TERM GOAL #5   Title Improve FOTO to </= 44% limitation    Time 12    Period Weeks    Status New    Target Date 01/10/20                 Plan - 10/29/19 0929    Clinical Impression Statement Patient reports that she is working on her exercises at home. Continues to have pain with any movement and with exercise. Added PROM in sitting (pt has not been lying supine yet). Tolerated PROM fair. Trial of TENS unit for pain management. Vaso was "too cold" and stopped at ~ 8 min.    Stability/Clinical Decision Making Stable/Uncomplicated    Clinical Decision Making Low    Rehab Potential Good    PT Frequency 2x / week  PT Duration 12 weeks    PT Treatment/Interventions Patient/family education;ADLs/Self Care Home Management;Aquatic Therapy;Cryotherapy;Electrical Stimulation;Iontophoresis 4mg /ml Dexamethasone;Moist Heat;Ultrasound;Neuromuscular re-education;Therapeutic exercise;Therapeutic activities;Manual techniques;Dry needling;Passive range of motion;Taping    PT Next Visit Plan review HEP; progress with PROM; progress with shoulder rehab per protocol; assess response to TENS and vaso for pain management    PT Home Exercise Plan HEP    Consulted and  Agree with Plan of Care Patient           Patient will benefit from skilled therapeutic intervention in order to improve the following deficits and impairments:     Visit Diagnosis: Acute pain of right shoulder  Abnormal posture  Muscle weakness (generalized)  Other symptoms and signs involving the musculoskeletal system     Problem List Patient Active Problem List   Diagnosis Date Noted  . 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 Hepatic steatosis 11/13/2018  . Tobacco use 07/18/2018  . Sun-induced skin changes, keratosis 08/08/2017  . Subcutaneous cyst 08/08/2017  . Irritable bowel syndrome (IBS) 05/04/2017  . Generalized anxiety disorder 03/16/2016  . Social anxiety disorder 03/16/2016  . Major depressive disorder, recurrent episode, moderate ([place]) 03/16/2016  . Right inguinal hernia 07/14/2015  . Hallux valgus 12/09/2014  . Acute headache 07/31/2014  . Chest pain 07/31/2014  . Hypotension 07/31/2014  . Other and unspecified ovarian cyst 06/25/2013  . Left tennis elbow 06/25/2013  . Left breast mass 06/27/2012  . Microscopic hematuria 06/27/2012  . Hyperlipidemia 06/14/2012  . Prediabetes 06/14/2012  . History of leukocytosis 06/14/2012  . Seasonal allergies 06/14/2012  . Depression 06/14/2012  . ADD (attention deficit disorder) 06/14/2012    Karlin Binion Nilda Simmer PT, MPH  10/29/2019, 11:44 AM  [facility] [place], [place], [address] Phone: [phone]   Fax:  [phone]  Name: Shley Dolby MRN: [id number] Date of Birth: [birth year]

## 2019-10-31 ENCOUNTER — Ambulatory Visit (INDEPENDENT_AMBULATORY_CARE_PROVIDER_SITE_OTHER): Payer: 59 | Admitting: Rehabilitative and Restorative Service Providers"

## 2019-10-31 ENCOUNTER — Other Ambulatory Visit: Payer: Self-pay

## 2019-10-31 ENCOUNTER — Encounter: Payer: Self-pay | Admitting: Rehabilitative and Restorative Service Providers"

## 2019-10-31 DIAGNOSIS — M6281 Muscle weakness (generalized): Secondary | ICD-10-CM

## 2019-10-31 DIAGNOSIS — M25511 Pain in right shoulder: Secondary | ICD-10-CM | POA: Diagnosis not present

## 2019-10-31 DIAGNOSIS — R293 Abnormal posture: Secondary | ICD-10-CM

## 2019-10-31 DIAGNOSIS — R29898 Other symptoms and signs involving the musculoskeletal system: Secondary | ICD-10-CM | POA: Diagnosis not present

## 2019-10-31 NOTE — Patient Instructions (Signed)
Access Code: [email or web address]: [email or web address]: 08/19/2021Prepared by: Addalynn Kumari HoltExercises  Standing Shoulder External Rotation AAROM with Dowel - 2 x daily - 7 x weekly - 1 sets - 5-10 reps - 10 sec hold  Seated Shoulder Flexion Towel Slide at Table Top - 3 x daily - 7 x weekly - 1 sets - 5-10 reps - 10 sec hold  Circular Shoulder Pendulum with Table Support - 2 x daily - 7 x weekly - 1 sets - 3 reps - 30 sec hold

## 2019-10-31 NOTE — Therapy (Signed)
[place], [place], [address] Phone: [phone]   Fax:  [phone]  Physical Therapy Treatment  Patient Details  Name: Sarah Nicholson MRN: [id number] Date of Birth: July 10, 1970 Referring Provider (PT): Dr Ophelia Charter    Encounter Date: 10/31/2019   PT End of Session - 10/31/19 0805    Visit Number 3    Number of Visits 24    Date for PT Re-Evaluation 01/10/20    PT Start Time 0804    PT Stop Time 0845    PT Time Calculation (min) 41 min    Activity Tolerance Patient tolerated treatment well           Past Medical History:  Diagnosis Date  . ADD (attention deficit disorder) 06/14/2012  . Anxiety   . Bronchitis   . Complication of anesthesia 1987   states woke up during appedentomy  . Depression 06/14/2012  . Hepatic steatosis 11/13/2018   Hepatic steatosis with hepatomegaly seen on MRI abd on 11/2018  . History of leukocytosis 06/14/2012   Reports "WBC 32" decades ago with negative hematology specialist workup.   [name] Hyperlipidemia 06/14/2012  . Seasonal allergies 06/14/2012    Past Surgical History:  Procedure Laterality Date  . APPENDECTOMY    . BICEPT TENODESIS Right 10/10/2019   Procedure: BICEPS TENODESIS;  Surgeon: Hiram Gash, MD;  Location: [facility];  Service: Orthopedics;  Laterality: Right;  . bladder sling/mesh    . INGUINAL HERNIA REPAIR    . intrauterine ablation    . NASAL SINUS SURGERY    . TUBAL LIGATION      There were no vitals filed for this visit.   Subjective Assessment - 10/31/19 0805    Subjective Patient reports that her arm did fine the day of therapy but was "really sore" the following day    Currently in Pain? Yes    Pain Score 3     Pain Location Shoulder    Pain Orientation Right    Pain Descriptors / Indicators Dull;Aching    Pain Type Surgical pain                             [facility] Adult PT Treatment/Exercise -  10/31/19 0001      Shoulder Exercises: ROM/Strengthening   Pendulum Rt UE hanging to 90 deg - moving body to move shd as tolerated       Shoulder Exercises: Stretch   External Rotation Stretch 5 reps;10 seconds   PROM standing back against noodle elbow at side cane ER    Table Stretch - Flexion 5 reps;10 seconds      Manual Therapy   Manual therapy comments pt supine     Joint Mobilization gentle GH movement     Soft tissue mobilization through the pecs; upper trap; posterior shoulder girdle; pecs; deltoid; biceps to elbow - to pt tolerance min to mod pressure     Passive ROM PROM Rt shoudler flexion; scaption; ER to neutral                   PT Education - 10/31/19 0821    Education Details HEP    Person(s) Educated Patient    Methods Explanation;Demonstration;Tactile cues;Verbal cues;Handout    Comprehension Verbalized understanding;Returned demonstration;Verbal cues required;Tactile cues required            PT Short Term Goals - 10/25/19 1619  PT SHORT TERM GOAL #1   Title Patient independent in initial HEP    Time 6    Period Weeks    Status New    Target Date 12/06/19      PT SHORT TERM GOAL #2   Title Increase PROM Rt shoulder as protocol allows    Time 6    Period Weeks    Status New    Target Date 12/06/19      PT SHORT TERM GOAL #3   Title Improve posture and alignment with patient demonstrating improved upright posture with posterior shoulder girdle engaged    Time 6    Period Weeks    Status New    Target Date 12/06/19             PT Long Term Goals - 10/25/19 1626      PT LONG TERM GOAL #1   Title AROM Rt shoulder to WFL's and pain free    Time 12    Period Weeks    Status New    Target Date 01/10/20      PT LONG TERM GOAL #2   Title 4+/5 to 5/5 strength Rt shoulder    Time 12    Period Weeks    Status New    Target Date 01/10/20      PT LONG TERM GOAL #3   Title patient reports that she has returned to normal functional  activities    Time 12    Period Weeks    Status New    Target Date 01/10/20      PT LONG TERM GOAL #4   Title Independent in HEP    Time 12    Period Weeks    Status New    Target Date 01/10/20      PT LONG TERM GOAL #5   Title Improve FOTO to </= 44% limitation    Time 12    Period Weeks    Status New    Target Date 01/10/20                 Plan - 10/31/19 0808    Clinical Impression Statement Patient returns reporting the the manual work helped loosen her muscles up some but shoulder was sore the following day. Working on exercises and posture at home. Still sleeping in the recliner. Added PROM with cane and table slide. Continue with manual work and PROM by PT    Rehab Potential Good    PT Frequency 2x / week    PT Duration 12 weeks    PT Treatment/Interventions Patient/family education;ADLs/Self Care Home Management;Aquatic Therapy;Cryotherapy;Electrical Stimulation;Iontophoresis 4mg /ml Dexamethasone;Moist Heat;Ultrasound;Neuromuscular re-education;Therapeutic exercise;Therapeutic activities;Manual techniques;Dry needling;Passive range of motion;Taping    PT Next Visit Plan review HEP; progress with PROM; progress with shoulder rehab per protocol; assess response to TENS and vaso for pain management    PT Home Exercise Plan HEP    Recommended Other Services [id number]    Consulted and Agree with Plan of Care Patient           Patient will benefit from skilled therapeutic intervention in order to improve the following deficits and impairments:     Visit Diagnosis: Acute pain of right shoulder  Abnormal posture  Muscle weakness (generalized)  Other symptoms and signs involving the musculoskeletal system     Problem List Patient Active Problem List   Diagnosis Date Noted  . 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 Hepatic steatosis 11/13/2018  .  Tobacco use 07/18/2018  . Sun-induced skin changes, keratosis 08/08/2017  . Subcutaneous  cyst 08/08/2017  . Irritable bowel syndrome (IBS) 05/04/2017  . Generalized anxiety disorder 03/16/2016  . Social anxiety disorder 03/16/2016  . Major depressive disorder, recurrent episode, moderate ([place]) 03/16/2016  . Right inguinal hernia 07/14/2015  . Hallux valgus 12/09/2014  . Acute headache 07/31/2014  . Chest pain 07/31/2014  . Hypotension 07/31/2014  . Other and unspecified ovarian cyst 06/25/2013  . Left tennis elbow 06/25/2013  . Left breast mass 06/27/2012  . Microscopic hematuria 06/27/2012  . Hyperlipidemia 06/14/2012  . Prediabetes 06/14/2012  . History of leukocytosis 06/14/2012  . Seasonal allergies 06/14/2012  . Depression 06/14/2012  . ADD (attention deficit disorder) 06/14/2012    Link Burgeson Nilda Simmer PT, MPH  10/31/2019, 8:46 AM  [facility] [place], [place], [address] Phone: [phone]   Fax:  [phone]  Name: Sarah Nicholson MRN: [id number] Date of Birth: 02-26-1971

## 2019-11-04 ENCOUNTER — Encounter: Payer: Self-pay | Admitting: Rehabilitative and Restorative Service Providers"

## 2019-11-04 ENCOUNTER — Other Ambulatory Visit: Payer: Self-pay

## 2019-11-04 ENCOUNTER — Ambulatory Visit (INDEPENDENT_AMBULATORY_CARE_PROVIDER_SITE_OTHER): Payer: 59 | Admitting: Rehabilitative and Restorative Service Providers"

## 2019-11-04 DIAGNOSIS — R293 Abnormal posture: Secondary | ICD-10-CM

## 2019-11-04 DIAGNOSIS — M6281 Muscle weakness (generalized): Secondary | ICD-10-CM | POA: Diagnosis not present

## 2019-11-04 DIAGNOSIS — R29898 Other symptoms and signs involving the musculoskeletal system: Secondary | ICD-10-CM | POA: Diagnosis not present

## 2019-11-04 DIAGNOSIS — M25511 Pain in right shoulder: Secondary | ICD-10-CM | POA: Diagnosis not present

## 2019-11-04 NOTE — Patient Instructions (Signed)
Access Code: [email or web address]: [email or web address]: 08/23/2021Prepared by: Crista Nuon HoltExercises  Standing Shoulder External Rotation AAROM with Dowel - 2 x daily - 7 x weekly - 1 sets - 5-10 reps - 10 sec hold  Seated Shoulder Flexion Towel Slide at Table Top - 3 x daily - 7 x weekly - 1 sets - 5-10 reps - 10 sec hold  Circular Shoulder Pendulum with Table Support - 2 x daily - 7 x weekly - 1 sets - 3 reps - 30 sec hold  Supine Scapular Retraction - 2 x daily - 7 x weekly - 1 sets - 10 reps - 5-10 sec hold

## 2019-11-04 NOTE — Therapy (Signed)
[place], [place], [address] Phone: [phone]   Fax:  [phone]  Physical Therapy Treatment  Patient Details  Name: Sarah Nicholson MRN: [id number] Date of Birth: May 18, 1970 Referring Provider (PT): Dr Ophelia Charter    Encounter Date: 11/04/2019   PT End of Session - 11/04/19 1023    Visit Number 4    Number of Visits 24    Date for PT Re-Evaluation 01/10/20    PT Start Time 1018    PT Stop Time 1100    PT Time Calculation (min) 42 min    Activity Tolerance Patient tolerated treatment well           Past Medical History:  Diagnosis Date  . ADD (attention deficit disorder) 06/14/2012  . Anxiety   . Bronchitis   . Complication of anesthesia 1987   states woke up during appedentomy  . Depression 06/14/2012  . Hepatic steatosis 11/13/2018   Hepatic steatosis with hepatomegaly seen on MRI abd on 11/2018  . History of leukocytosis 06/14/2012   Reports "WBC 32" decades ago with negative hematology specialist workup.   [name] Hyperlipidemia 06/14/2012  . Seasonal allergies 06/14/2012    Past Surgical History:  Procedure Laterality Date  . APPENDECTOMY    . BICEPT TENODESIS Right 10/10/2019   Procedure: BICEPS TENODESIS;  Surgeon: Hiram Gash, MD;  Location: [facility];  Service: Orthopedics;  Laterality: Right;  . bladder sling/mesh    . INGUINAL HERNIA REPAIR    . intrauterine ablation    . NASAL SINUS SURGERY    . TUBAL LIGATION      There were no vitals filed for this visit.   Subjective Assessment - 11/04/19 1023    Subjective post op pain is improving. working on exercises at home. sleeping in the bed now    Currently in Pain? Yes    Pain Score 2     Pain Location Shoulder    Pain Orientation Right    Pain Descriptors / Indicators Aching;Dull    Pain Type Surgical pain    Pain Onset More than a month ago    Pain Frequency Intermittent                              [facility] Adult PT Treatment/Exercise - 11/04/19 0001      Shoulder Exercises: Supine   Other Supine Exercises chest lift 5 sec hold x 10       Shoulder Exercises: ROM/Strengthening   Pendulum Rt UE hanging to 90 deg - moving body to move shd as tolerated     Other ROM/Strengthening Exercises shoulder rolls posteriorly x 10-15       Shoulder Exercises: Stretch   External Rotation Stretch 5 reps;10 seconds   PROM standing back against noodle elbow at side cane ER    Table Stretch - Flexion 5 reps;10 seconds    Other Shoulder Stretches supine working on relaxing Rt shoulder to tolerate flat/neutral position       Manual Therapy   Manual therapy comments pt supine     Joint Mobilization gentle GH movement     Soft tissue mobilization through the pecs; upper trap; posterior shoulder girdle; pecs; deltoid; biceps to elbow - to pt tolerance min to mod pressure     Passive ROM PROM Rt shoudler flexion; scaption; ER to neutral  PT Education - 11/04/19 1035    Education Details HEP    Person(s) Educated Patient    Methods Explanation;Demonstration;Tactile cues;Verbal cues;Handout    Comprehension Verbalized understanding;Returned demonstration;Verbal cues required;Tactile cues required            PT Short Term Goals - 10/25/19 1619      PT SHORT TERM GOAL #1   Title Patient independent in initial HEP    Time 6    Period Weeks    Status New    Target Date 12/06/19      PT SHORT TERM GOAL #2   Title Increase PROM Rt shoulder as protocol allows    Time 6    Period Weeks    Status New    Target Date 12/06/19      PT SHORT TERM GOAL #3   Title Improve posture and alignment with patient demonstrating improved upright posture with posterior shoulder girdle engaged    Time 6    Period Weeks    Status New    Target Date 12/06/19             PT Long Term Goals - 10/25/19 1626      PT LONG TERM GOAL #1   Title  AROM Rt shoulder to WFL's and pain free    Time 12    Period Weeks    Status New    Target Date 01/10/20      PT LONG TERM GOAL #2   Title 4+/5 to 5/5 strength Rt shoulder    Time 12    Period Weeks    Status New    Target Date 01/10/20      PT LONG TERM GOAL #3   Title patient reports that she has returned to normal functional activities    Time 12    Period Weeks    Status New    Target Date 01/10/20      PT LONG TERM GOAL #4   Title Independent in HEP    Time 12    Period Weeks    Status New    Target Date 01/10/20      PT LONG TERM GOAL #5   Title Improve FOTO to </= 44% limitation    Time 12    Period Weeks    Status New    Target Date 01/10/20                 Plan - 11/04/19 1025    Clinical Impression Statement Progressing well with HEP per protocol. improving ROM and decreasing pain. Returns to MD Friday.    Rehab Potential Good    PT Frequency 2x / week    PT Duration 12 weeks    PT Treatment/Interventions Patient/family education;ADLs/Self Care Home Management;Aquatic Therapy;Cryotherapy;Electrical Stimulation;Iontophoresis 4mg /ml Dexamethasone;Moist Heat;Ultrasound;Neuromuscular re-education;Therapeutic exercise;Therapeutic activities;Manual techniques;Dry needling;Passive range of motion;Taping    PT Next Visit Plan review HEP; progress with PROM; progress with shoulder rehab per protocol; does not like TENS and vaso using ice at home as needed for pain management    PT [place]; HEP    Consulted and Agree with Plan of Care Patient           Patient will benefit from skilled therapeutic intervention in order to improve the following deficits and impairments:     Visit Diagnosis: Acute pain of right shoulder  Abnormal posture  Muscle weakness (generalized)  Other symptoms and signs involving the musculoskeletal system  Problem List Patient Active Problem List   Diagnosis Date Noted  . Osteoarthritis of right  acromioclavicular joint 05/07/2019  . Hepatic steatosis 11/13/2018  . Tobacco use 07/18/2018  . Sun-induced skin changes, keratosis 08/08/2017  . Subcutaneous cyst 08/08/2017  . Irritable bowel syndrome (IBS) 05/04/2017  . Generalized anxiety disorder 03/16/2016  . Social anxiety disorder 03/16/2016  . Major depressive disorder, recurrent episode, moderate ([place]) 03/16/2016  . Right inguinal hernia 07/14/2015  . Hallux valgus 12/09/2014  . Acute headache 07/31/2014  . Chest pain 07/31/2014  . Hypotension 07/31/2014  . Other and unspecified ovarian cyst 06/25/2013  . Left tennis elbow 06/25/2013  . Left breast mass 06/27/2012  . Microscopic hematuria 06/27/2012  . Hyperlipidemia 06/14/2012  . Prediabetes 06/14/2012  . History of leukocytosis 06/14/2012  . Seasonal allergies 06/14/2012  . Depression 06/14/2012  . ADD (attention deficit disorder) 06/14/2012    Shawntina Diffee Nilda Simmer PT, MPH  11/04/2019, 10:59 AM  [facility] [place], [place], [address] Phone: [phone]   Fax:  [phone]  Name: Sarah Nicholson MRN: [id number] Date of Birth: May 31, 1970

## 2019-11-07 ENCOUNTER — Encounter: Payer: 59 | Admitting: Rehabilitative and Restorative Service Providers"

## 2019-11-08 ENCOUNTER — Ambulatory Visit (INDEPENDENT_AMBULATORY_CARE_PROVIDER_SITE_OTHER): Payer: 59 | Admitting: Rehabilitative and Restorative Service Providers"

## 2019-11-08 ENCOUNTER — Encounter: Payer: Self-pay | Admitting: Rehabilitative and Restorative Service Providers"

## 2019-11-08 ENCOUNTER — Other Ambulatory Visit: Payer: Self-pay

## 2019-11-08 DIAGNOSIS — M25511 Pain in right shoulder: Secondary | ICD-10-CM | POA: Diagnosis not present

## 2019-11-08 DIAGNOSIS — R29898 Other symptoms and signs involving the musculoskeletal system: Secondary | ICD-10-CM | POA: Diagnosis not present

## 2019-11-08 DIAGNOSIS — M6281 Muscle weakness (generalized): Secondary | ICD-10-CM

## 2019-11-08 DIAGNOSIS — R293 Abnormal posture: Secondary | ICD-10-CM

## 2019-11-08 NOTE — Patient Instructions (Addendum)
[email or web address] Code: [email or web address]: [email or web address]: 08/27/2021Prepared by: Akshar Starnes HoltExercises  Standing Shoulder External Rotation AAROM with Dowel - 2 x daily - 7 x weekly - 1 sets - 5-10 reps - 10 sec hold  Seated Shoulder Flexion Towel Slide at Table Top - 3 x daily - 7 x weekly - 1 sets - 5-10 reps - 10 sec hold  Circular Shoulder Pendulum with Table Support - 2 x daily - 7 x weekly - 1 sets - 3 reps - 30 sec hold  Supine Scapular Retraction - 2 x daily - 7 x weekly - 1 sets - 10 reps - 5-10 sec hold  Isometric Shoulder Extension at Wall - 2 x daily - 7 x weekly - 1 sets - 5-10 reps - 5 sec hold  Isometric Shoulder Abduction at Wall - 2 x daily - 7 x weekly - 1 sets - 5-10 reps - 5 sec hold  Standing Isometric Shoulder External Rotation with Doorway - 2 x daily - 7 x weekly - 1 sets - 5-10 reps - 5 sec hold  Standing Shoulder and Trunk Flexion at Table - 2 x daily - 7 x weekly - 1 sets - 5 reps - 10-15 sec hold WKN

## 2019-11-08 NOTE — Therapy (Signed)
[place], [place], [address] Phone: [phone]   Fax:  [phone]  Physical Therapy Treatment  Patient Details  Name: Sarah Nicholson MRN: [id number] Date of Birth: 1970/08/17 Referring Provider (PT): Dr Ophelia Charter    Encounter Date: 11/08/2019   PT End of Session - 11/08/19 1324    Visit Number 5    Number of Visits 24    Date for PT Re-Evaluation 01/10/20    PT Start Time 0254    PT Stop Time 1500    PT Time Calculation (min) 43 min    Activity Tolerance Patient tolerated treatment well           Past Medical History:  Diagnosis Date  . ADD (attention deficit disorder) 06/14/2012  . Anxiety   . Bronchitis   . Complication of anesthesia 1987   states woke up during appedentomy  . Depression 06/14/2012  . Hepatic steatosis 11/13/2018   Hepatic steatosis with hepatomegaly seen on MRI abd on 11/2018  . History of leukocytosis 06/14/2012   Reports "WBC 32" decades ago with negative hematology specialist workup.   [name] Hyperlipidemia 06/14/2012  . Seasonal allergies 06/14/2012    Past Surgical History:  Procedure Laterality Date  . APPENDECTOMY    . BICEPT TENODESIS Right 10/10/2019   Procedure: BICEPS TENODESIS;  Surgeon: Hiram Gash, MD;  Location: [facility];  Service: Orthopedics;  Laterality: Right;  . bladder sling/mesh    . INGUINAL HERNIA REPAIR    . intrauterine ablation    . NASAL SINUS SURGERY    . TUBAL LIGATION      There were no vitals filed for this visit.   Subjective Assessment - 11/08/19 1326    Subjective Patient was seen by MD today - received an injection to help with ROM. Will be out of the sling now. Ready to progress with rehab.    Currently in Pain? Yes    Pain Score 2     Pain Location Shoulder    Pain Orientation Right    Pain Descriptors / Indicators Aching;Dull    Pain Type Surgical pain              [facility] PT Assessment - 11/08/19  0001      Assessment   Medical Diagnosis Rt biceps tenodesis; SAD; DCR     Referring Provider (PT) Dr Ophelia Charter     Onset Date/Surgical Date 10/10/19    Hand Dominance Right    Next MD Visit 11/10/19    Prior Therapy yes PT for shd at another facility      PROM   Right Shoulder Flexion 142 Degrees    Right Shoulder ABduction 126 Degrees   in scapular plane    Right Shoulder External Rotation 50 Degrees   in scapular plane      Palpation   Palpation comment muscular tightness noted through the Rt upper quarter                          [facility] Adult PT Treatment/Exercise - 11/08/19 0001      Shoulder Exercises: Standing   Flexion PROM;Right;5 reps   step back with UE's resting on table      Shoulder Exercises: Isometric Strengthening   Extension 5X5"    External Rotation 5X5"    ABduction 5X5"      Shoulder Exercises: Stretch   External Rotation Stretch 5  reps;10 seconds   PROM standing back against noodle elbow at side cane ER    Table Stretch - Flexion 5 reps;10 seconds    Other Shoulder Stretches supine working on relaxing Rt shoulder to tolerate flat/neutral position       Manual Therapy   Manual therapy comments pt supine     Joint Mobilization circumduction of GH movement     Soft tissue mobilization through the pecs; upper trap; posterior shoulder girdle; pecs; deltoid; biceps to elbow - to pt tolerance min to mod pressure     Passive ROM PROM Rt shoudler flexion; scaption; ER to neutral                   PT Education - 11/08/19 1340    Education Details HEP    Person(s) Educated Patient    Methods Explanation;Demonstration;Tactile cues;Verbal cues;Handout    Comprehension Verbalized understanding;Returned demonstration;Verbal cues required;Tactile cues required            PT Short Term Goals - 10/25/19 1619      PT SHORT TERM GOAL #1   Title Patient independent in initial HEP    Time 6    Period Weeks    Status New    Target Date  12/06/19      PT SHORT TERM GOAL #2   Title Increase PROM Rt shoulder as protocol allows    Time 6    Period Weeks    Status New    Target Date 12/06/19      PT SHORT TERM GOAL #3   Title Improve posture and alignment with patient demonstrating improved upright posture with posterior shoulder girdle engaged    Time 6    Period Weeks    Status New    Target Date 12/06/19             PT Long Term Goals - 10/25/19 1626      PT LONG TERM GOAL #1   Title AROM Rt shoulder to WFL's and pain free    Time 12    Period Weeks    Status New    Target Date 01/10/20      PT LONG TERM GOAL #2   Title 4+/5 to 5/5 strength Rt shoulder    Time 12    Period Weeks    Status New    Target Date 01/10/20      PT LONG TERM GOAL #3   Title patient reports that she has returned to normal functional activities    Time 12    Period Weeks    Status New    Target Date 01/10/20      PT LONG TERM GOAL #4   Title Independent in HEP    Time 12    Period Weeks    Status New    Target Date 01/10/20      PT LONG TERM GOAL #5   Title Improve FOTO to </= 44% limitation    Time 12    Period Weeks    Status New    Target Date 01/10/20                 Plan - 11/08/19 1330    Clinical Impression Statement Muscle guarding and tightness Rt shoulder. Unable to measure ROM and send note to MD yesterday with patient missing her appointment. She does well with manual work and relaxation prior to ROM. Added ROM and isometric exercises today. Note to MD today with PROM  Rehab Potential Good    PT Frequency 2x / week    PT Duration 12 weeks    PT Treatment/Interventions Patient/family education;ADLs/Self Care Home Management;Aquatic Therapy;Cryotherapy;Electrical Stimulation;Iontophoresis 4mg /ml Dexamethasone;Moist Heat;Ultrasound;Neuromuscular re-education;Therapeutic exercise;Therapeutic activities;Manual techniques;Dry needling;Passive range of motion;Taping    PT Next Visit Plan review  HEP; progress with PROM; progress with shoulder rehab per protocol; does not like TENS and vaso using ice at home as needed for pain management    PT [place]; HEP    Consulted and Agree with Plan of Care Patient           Patient will benefit from skilled therapeutic intervention in order to improve the following deficits and impairments:     Visit Diagnosis: Acute pain of right shoulder  Abnormal posture  Muscle weakness (generalized)  Other symptoms and signs involving the musculoskeletal system     Problem List Patient Active Problem List   Diagnosis Date Noted  . 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 Hepatic steatosis 11/13/2018  . Tobacco use 07/18/2018  . Sun-induced skin changes, keratosis 08/08/2017  . Subcutaneous cyst 08/08/2017  . Irritable bowel syndrome (IBS) 05/04/2017  . Generalized anxiety disorder 03/16/2016  . Social anxiety disorder 03/16/2016  . Major depressive disorder, recurrent episode, moderate ([place]) 03/16/2016  . Right inguinal hernia 07/14/2015  . Hallux valgus 12/09/2014  . Acute headache 07/31/2014  . Chest pain 07/31/2014  . Hypotension 07/31/2014  . Other and unspecified ovarian cyst 06/25/2013  . Left tennis elbow 06/25/2013  . Left breast mass 06/27/2012  . Microscopic hematuria 06/27/2012  . Hyperlipidemia 06/14/2012  . Prediabetes 06/14/2012  . History of leukocytosis 06/14/2012  . Seasonal allergies 06/14/2012  . Depression 06/14/2012  . ADD (attention deficit disorder) 06/14/2012    Sarah Nicholson Nilda Simmer PT, MPH  11/08/2019, 1:59 PM  [facility] [place], [place], [address] Phone: [phone]   Fax:  [phone]  Name: Sarah Nicholson MRN: [id number] Date of Birth: 08-24-1970

## 2019-11-11 ENCOUNTER — Ambulatory Visit (INDEPENDENT_AMBULATORY_CARE_PROVIDER_SITE_OTHER): Payer: 59 | Admitting: Rehabilitative and Restorative Service Providers"

## 2019-11-11 ENCOUNTER — Encounter: Payer: Self-pay | Admitting: Rehabilitative and Restorative Service Providers"

## 2019-11-11 ENCOUNTER — Other Ambulatory Visit: Payer: Self-pay

## 2019-11-11 DIAGNOSIS — R29898 Other symptoms and signs involving the musculoskeletal system: Secondary | ICD-10-CM | POA: Diagnosis not present

## 2019-11-11 DIAGNOSIS — M25511 Pain in right shoulder: Secondary | ICD-10-CM | POA: Diagnosis not present

## 2019-11-11 DIAGNOSIS — M6281 Muscle weakness (generalized): Secondary | ICD-10-CM

## 2019-11-11 DIAGNOSIS — R293 Abnormal posture: Secondary | ICD-10-CM | POA: Diagnosis not present

## 2019-11-11 NOTE — Therapy (Signed)
[place], [place], [address] Phone: [phone]   Fax:  [phone]  Physical Therapy Treatment  Patient Details  Name: Sarah Nicholson MRN: [id number] Date of Birth: 06/05/1970 Referring Provider (PT): Dr Ophelia Charter    Encounter Date: 11/11/2019   PT End of Session - 11/11/19 1112    Visit Number 6    Number of Visits 24    Date for PT Re-Evaluation 01/10/20    PT Start Time 1106    PT Stop Time 1147    PT Time Calculation (min) 41 min    Activity Tolerance Patient tolerated treatment well           Past Medical History:  Diagnosis Date   ADD (attention deficit disorder) 06/14/2012   Anxiety    Bronchitis    Complication of anesthesia 1987   states woke up during appedentomy   Depression 06/14/2012   Hepatic steatosis 11/13/2018   Hepatic steatosis with hepatomegaly seen on MRI abd on 11/2018   History of leukocytosis 06/14/2012   Reports "WBC 32" decades ago with negative hematology specialist workup.    Hyperlipidemia 06/14/2012   Seasonal allergies 06/14/2012    Past Surgical History:  Procedure Laterality Date   APPENDECTOMY     BICEPT TENODESIS Right 10/10/2019   Procedure: BICEPS TENODESIS;  Surgeon: Hiram Gash, MD;  Location: [facility];  Service: Orthopedics;  Laterality: Right;   bladder sling/mesh     INGUINAL HERNIA REPAIR     intrauterine ablation     NASAL SINUS SURGERY     TUBAL LIGATION      There were no vitals filed for this visit.   Subjective Assessment - 11/11/19 1113    Subjective Resting better without sling. Exercising without difficulty. some increased discomfort following exercises which resolves in ~ 30 min    Currently in Pain? Yes    Pain Score 2     Pain Location Shoulder    Pain Orientation Right    Pain Descriptors / Indicators Aching;Dull    Pain Type Surgical pain;Chronic pain    Pain Onset More than a month ago                              [facility] Adult PT Treatment/Exercise - 11/11/19 0001      Shoulder Exercises: Standing   Extension Strengthening;Both;10 reps;Theraband    Theraband Level (Shoulder Extension) Level 2 (Red)    Row Strengthening;Both;10 reps;Theraband    Theraband Level (Shoulder Row) Level 2 (Red)      Shoulder Exercises: Isometric Strengthening   Flexion 5X5"    Extension 5X5"    External Rotation 5X5"    ABduction 5X5"      Shoulder Exercises: Stretch   External Rotation Stretch 5 reps;10 seconds   PROM standing back against noodle elbow at side cane ER    Table Stretch - Flexion 5 reps;10 seconds    Table Stretch -Flexion Limitations step back hands resting on counter to stretch into flexion 10 sec hold x 5 reps     Other Shoulder Stretches supine working on relaxing Rt shoulder to tolerate flat/neutral position       Manual Therapy   Manual therapy comments pt supine     Joint Mobilization circumduction of GH movement     Soft tissue mobilization through the pecs; upper trap; posterior shoulder girdle; pecs; deltoid; biceps  to elbow - to pt tolerance min to mod pressure     Passive ROM PROM Rt shoudler flexion; scaption; ER to neutral                   PT Education - 11/11/19 1129    Education Details HEP    Person(s) Educated Patient    Methods Explanation;Demonstration;Tactile cues;Verbal cues;Handout    Comprehension Verbalized understanding;Returned demonstration;Verbal cues required;Tactile cues required            PT Short Term Goals - 10/25/19 1619      PT SHORT TERM GOAL #1   Title Patient independent in initial HEP    Time 6    Period Weeks    Status New    Target Date 12/06/19      PT SHORT TERM GOAL #2   Title Increase PROM Rt shoulder as protocol allows    Time 6    Period Weeks    Status New    Target Date 12/06/19      PT SHORT TERM GOAL #3   Title Improve posture and alignment with patient demonstrating  improved upright posture with posterior shoulder girdle engaged    Time 6    Period Weeks    Status New    Target Date 12/06/19             PT Long Term Goals - 10/25/19 1626      PT LONG TERM GOAL #1   Title AROM Rt shoulder to WFL's and pain free    Time 12    Period Weeks    Status New    Target Date 01/10/20      PT LONG TERM GOAL #2   Title 4+/5 to 5/5 strength Rt shoulder    Time 12    Period Weeks    Status New    Target Date 01/10/20      PT LONG TERM GOAL #3   Title patient reports that she has returned to normal functional activities    Time 12    Period Weeks    Status New    Target Date 01/10/20      PT LONG TERM GOAL #4   Title Independent in HEP    Time 12    Period Weeks    Status New    Target Date 01/10/20      PT LONG TERM GOAL #5   Title Improve FOTO to </= 44% limitation    Time 12    Period Weeks    Status New    Target Date 01/10/20                 Plan - 11/11/19 1120    Clinical Impression Statement Continued improvement. Patient reports that she is sleeping better and now sleeping in the bed. She is working on the exercises without dififculty. progressing with isometric exercises and some TB exercises.    Rehab Potential Good    PT Frequency 2x / week    PT Duration 6 weeks    PT Treatment/Interventions Patient/family education;ADLs/Self Care Home Management;Aquatic Therapy;Cryotherapy;Electrical Stimulation;Iontophoresis 4mg /ml Dexamethasone;Moist Heat;Ultrasound;Neuromuscular re-education;Therapeutic exercise;Therapeutic activities;Manual techniques;Dry needling;Passive range of motion;Taping    PT Next Visit Plan review HEP; progress with PROM; progress with shoulder rehab per protocol; does not like TENS and vaso using ice at home as needed for pain management    PT [place]; HEP    Consulted and Agree with Plan of Care  Patient           Patient will benefit from skilled therapeutic intervention in  order to improve the following deficits and impairments:     Visit Diagnosis: Acute pain of right shoulder  Abnormal posture  Muscle weakness (generalized)  Other symptoms and signs involving the musculoskeletal system     Problem List Patient Active Problem List   Diagnosis Date Noted   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   Hepatic steatosis 11/13/2018   Tobacco use 07/18/2018   Sun-induced skin changes, keratosis 08/08/2017   Subcutaneous cyst 08/08/2017   Irritable bowel syndrome (IBS) 05/04/2017   Generalized anxiety disorder 03/16/2016   Social anxiety disorder 03/16/2016   Major depressive disorder, recurrent episode, moderate ([place]) 03/16/2016   Right inguinal hernia 07/14/2015   Hallux valgus 12/09/2014   Acute headache 07/31/2014   Chest pain 07/31/2014   Hypotension 07/31/2014   Other and unspecified ovarian cyst 06/25/2013   Left tennis elbow 06/25/2013   Left breast mass 06/27/2012   Microscopic hematuria 06/27/2012   Hyperlipidemia 06/14/2012   Prediabetes 06/14/2012   History of leukocytosis 06/14/2012   Seasonal allergies 06/14/2012   Depression 06/14/2012   ADD (attention deficit disorder) 06/14/2012    Jonessa Triplett Nilda Simmer PT, MPH  11/11/2019, 1:15 PM  [facility] [place], [place], [address] Phone: [phone]   Fax:  [phone]  Name: Sarah Nicholson MRN: [id number] Date of Birth: 1970-04-17

## 2019-11-11 NOTE — Patient Instructions (Signed)
Access Code: [email or web address]: [email or web address]: 08/30/2021Prepared by: Emiline Mancebo HoltExercises  Standing Shoulder External Rotation AAROM with [place]el - 2 x daily - 7 x weekly - 1 sets - 5-10 reps - 10 sec hold  Seated Shoulder Flexion Towel Slide at Table Top - 3 x daily - 7 x weekly - 1 sets - 5-10 reps - 10 sec hold  Circular Shoulder Pendulum with Table Support - 2 x daily - 7 x weekly - 1 sets - 3 reps - 30 sec hold  Supine Scapular Retraction - 2 x daily - 7 x weekly - 1 sets - 10 reps - 5-10 sec hold  Isometric Shoulder Extension at Wall - 2 x daily - 7 x weekly - 1 sets - 5-10 reps - 5 sec hold  Isometric Shoulder Abduction at Wall - 2 x daily - 7 x weekly - 1 sets - 5-10 reps - 5 sec hold  Standing Isometric Shoulder External Rotation with Doorway - 2 x daily - 7 x weekly - 1 sets - 5-10 reps - 5 sec hold  Standing Shoulder and Trunk Flexion at Table - 2 x daily - 7 x weekly - 1 sets - 5 reps - 10-15 sec hold  Standing Isometric Shoulder Flexion with Doorway - Arm Bent - 2 x daily - 7 x weekly - 1 sets - 5-10 reps - 5 sec hold  Standing Bilateral Low Shoulder Row with Anchored Resistance - 2 x daily - 7 x weekly - 1-3 sets - 10 reps - 2-3 sec hold  Shoulder Extension with Resistance - 2 x daily - 7 x weekly - 1-3 sets - 10 reps - 2-3 sec hold

## 2019-11-14 ENCOUNTER — Encounter: Payer: Self-pay | Admitting: Rehabilitative and Restorative Service Providers"

## 2019-11-14 ENCOUNTER — Other Ambulatory Visit: Payer: Self-pay

## 2019-11-14 ENCOUNTER — Ambulatory Visit (INDEPENDENT_AMBULATORY_CARE_PROVIDER_SITE_OTHER): Payer: 59 | Admitting: Rehabilitative and Restorative Service Providers"

## 2019-11-14 DIAGNOSIS — M25511 Pain in right shoulder: Secondary | ICD-10-CM

## 2019-11-14 DIAGNOSIS — R29898 Other symptoms and signs involving the musculoskeletal system: Secondary | ICD-10-CM | POA: Diagnosis not present

## 2019-11-14 DIAGNOSIS — M6281 Muscle weakness (generalized): Secondary | ICD-10-CM

## 2019-11-14 DIAGNOSIS — R293 Abnormal posture: Secondary | ICD-10-CM | POA: Diagnosis not present

## 2019-11-14 NOTE — Therapy (Signed)
[place], [place], [address] Phone: [phone]   Fax:  [phone]  Physical Therapy Treatment  Patient Details  Name: Sarah Nicholson MRN: [id number] Date of Birth: Oct 23, 1970 Referring Provider (PT): Dr Ophelia Charter    Encounter Date: 11/14/2019   PT End of Session - 11/14/19 1708    Visit Number 7    Number of Visits 24    Date for PT Re-Evaluation 01/10/20    PT Start Time 1702    PT Stop Time 1740    PT Time Calculation (min) 38 min    Activity Tolerance Patient tolerated treatment well           Past Medical History:  Diagnosis Date  . ADD (attention deficit disorder) 06/14/2012  . Anxiety   . Bronchitis   . Complication of anesthesia 1987   states woke up during appedentomy  . Depression 06/14/2012  . Hepatic steatosis 11/13/2018   Hepatic steatosis with hepatomegaly seen on MRI abd on 11/2018  . History of leukocytosis 06/14/2012   Reports "WBC 32" decades ago with negative hematology specialist workup.   [name] Hyperlipidemia 06/14/2012  . Seasonal allergies 06/14/2012    Past Surgical History:  Procedure Laterality Date  . APPENDECTOMY    . BICEPT TENODESIS Right 10/10/2019   Procedure: BICEPS TENODESIS;  Surgeon: Hiram Gash, MD;  Location: [facility];  Service: Orthopedics;  Laterality: Right;  . bladder sling/mesh    . INGUINAL HERNIA REPAIR    . intrauterine ablation    . NASAL SINUS SURGERY    . TUBAL LIGATION      There were no vitals filed for this visit.   Subjective Assessment - 11/14/19 1709    Subjective Back at work and adjusting well. Was sore for the first few days. Wants to move her arm more. Now sleeping in the bed and can rest with arms flat on bed for short periods of time    Currently in Pain? No/denies    Pain Score 0-No pain              [facility] PT Assessment - 11/14/19 0001      Assessment   Medical Diagnosis Rt biceps tenodesis; SAD;  DCR     Referring Provider (PT) Dr Ophelia Charter     Onset Date/Surgical Date 10/10/19    Hand Dominance Right    Next MD Visit 11/10/19    Prior Therapy yes PT for shd at another facility      PROM   Overall PROM Comments PROM with pt in supine     Right Shoulder Flexion 146 Degrees    Right Shoulder ABduction 126 Degrees   in scapular plane    Right Shoulder External Rotation 50 Degrees   in scapular plane neutral elbow at 90 deg flexion      Palpation   Palpation comment muscular tightness Rt pecs; anterior deltoid; biceps; upper trap; levator; teres; lats                          [facility] Adult PT Treatment/Exercise - 11/14/19 0001      Shoulder Exercises: Supine   Other Supine Exercises chest lift 5 sec hold x 10       Shoulder Exercises: Standing   Extension Strengthening;Both;10 reps;Theraband    Theraband Level (Shoulder Extension) Level 2 (Red)    Row Strengthening;Both;10 reps;Theraband    Theraband  Level (Shoulder Row) Level 2 (Red)    Other Standing Exercises scap squeeze 10 sec x 10 reps with foam roll along spine       Shoulder Exercises: ROM/Strengthening   Pendulum Rt UE hanging to 90 deg - 20-30 CW/CCW    Other ROM/Strengthening Exercises shoulder rolls posteriorly x 10-15       Shoulder Exercises: Isometric Strengthening   Flexion 5X5"    Extension 5X5"    External Rotation 5X5"    ABduction 5X5"      Shoulder Exercises: Stretch   External Rotation Stretch 5 reps;10 seconds   PROM standing back against noodle elbow at side cane ER    Table Stretch - Flexion 5 reps;10 seconds    Table Stretch -Flexion Limitations step back hands resting on counter to stretch into flexion 10 sec hold x 5 reps     Other Shoulder Stretches supine lying with Rt UE flat along treatment table to stretch through pecs and anterior shoulder       Manual Therapy   Manual therapy comments pt supine     Joint Mobilization circumduction of GH movement     Soft tissue  mobilization through the pecs; upper trap; posterior shoulder girdle; pecs; deltoid; biceps to elbow - to pt tolerance min to mod pressure     Passive ROM PROM Rt shoudler flexion; scaption; ER to neutral                     PT Short Term Goals - 10/25/19 1619      PT SHORT TERM GOAL #1   Title Patient independent in initial HEP    Time 6    Period Weeks    Status New    Target Date 12/06/19      PT SHORT TERM GOAL #2   Title Increase PROM Rt shoulder as protocol allows    Time 6    Period Weeks    Status New    Target Date 12/06/19      PT SHORT TERM GOAL #3   Title Improve posture and alignment with patient demonstrating improved upright posture with posterior shoulder girdle engaged    Time 6    Period Weeks    Status New    Target Date 12/06/19             PT Long Term Goals - 10/25/19 1626      PT LONG TERM GOAL #1   Title AROM Rt shoulder to WFL's and pain free    Time 12    Period Weeks    Status New    Target Date 01/10/20      PT LONG TERM GOAL #2   Title 4+/5 to 5/5 strength Rt shoulder    Time 12    Period Weeks    Status New    Target Date 01/10/20      PT LONG TERM GOAL #3   Title patient reports that she has returned to normal functional activities    Time 12    Period Weeks    Status New    Target Date 01/10/20      PT LONG TERM GOAL #4   Title Independent in HEP    Time 12    Period Weeks    Status New    Target Date 01/10/20      PT LONG TERM GOAL #5   Title Improve FOTO to </= 44% limitation    Time 12  Period Weeks    Status New    Target Date 01/10/20                 Plan - 11/14/19 1744    Clinical Impression Statement Decreased pain. 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 Note improved tissue extensibiliity through Rt shoudler girdle. Good ROM. Progressing well per MD rehab protocol.    Rehab Potential Good    PT Frequency 2x / week    PT Duration 6 weeks    PT  Treatment/Interventions Patient/family education;ADLs/Self Care Home Management;Aquatic Therapy;Cryotherapy;Electrical Stimulation;Iontophoresis 4mg /ml Dexamethasone;Moist Heat;Ultrasound;Neuromuscular re-education;Therapeutic exercise;Therapeutic activities;Manual techniques;Dry needling;Passive range of motion;Taping    PT Next Visit Plan review HEP; progress with PROM; progress with shoulder rehab per protocol; does not like TENS and vaso using ice at home as needed for pain management    PT [place]; HEP    Consulted and Agree with Plan of Care Patient           Patient will benefit from skilled therapeutic intervention in order to improve the following deficits and impairments:     Visit Diagnosis: Acute pain of right shoulder  Abnormal posture  Muscle weakness (generalized)  Other symptoms and signs involving the musculoskeletal system     Problem List Patient Active Problem List   Diagnosis Date Noted  . 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 Hepatic steatosis 11/13/2018  . Tobacco use 07/18/2018  . Sun-induced skin changes, keratosis 08/08/2017  . Subcutaneous cyst 08/08/2017  . Irritable bowel syndrome (IBS) 05/04/2017  . Generalized anxiety disorder 03/16/2016  . Social anxiety disorder 03/16/2016  . Major depressive disorder, recurrent episode, moderate ([place]) 03/16/2016  . Right inguinal hernia 07/14/2015  . Hallux valgus 12/09/2014  . Acute headache 07/31/2014  . Chest pain 07/31/2014  . Hypotension 07/31/2014  . Other and unspecified ovarian cyst 06/25/2013  . Left tennis elbow 06/25/2013  . Left breast mass 06/27/2012  . Microscopic hematuria 06/27/2012  . Hyperlipidemia 06/14/2012  . Prediabetes 06/14/2012  . History of leukocytosis 06/14/2012  . Seasonal allergies 06/14/2012  . Depression 06/14/2012  . ADD (attention deficit disorder) 06/14/2012    Emerie Vanderkolk Nilda Simmer PT, MPH  11/14/2019, 5:48 PM  [facility] [place], [place], [address] Phone: [phone]   Fax:  [phone]  Name: Lianette Broussard MRN: [id number] Date of Birth: 1970/07/16

## 2019-11-19 ENCOUNTER — Other Ambulatory Visit: Payer: Self-pay

## 2019-11-19 ENCOUNTER — Encounter: Payer: Self-pay | Admitting: Physical Therapy

## 2019-11-19 ENCOUNTER — Ambulatory Visit (INDEPENDENT_AMBULATORY_CARE_PROVIDER_SITE_OTHER): Payer: 59 | Admitting: Physical Therapy

## 2019-11-19 DIAGNOSIS — M6281 Muscle weakness (generalized): Secondary | ICD-10-CM

## 2019-11-19 DIAGNOSIS — R293 Abnormal posture: Secondary | ICD-10-CM | POA: Diagnosis not present

## 2019-11-19 DIAGNOSIS — M25511 Pain in right shoulder: Secondary | ICD-10-CM | POA: Diagnosis not present

## 2019-11-19 DIAGNOSIS — R29898 Other symptoms and signs involving the musculoskeletal system: Secondary | ICD-10-CM | POA: Diagnosis not present

## 2019-11-19 NOTE — Therapy (Signed)
[place], [place], [address] Phone: [phone]   Fax:  [phone]  Physical Therapy Treatment  Patient Details  Name: Sarah Nicholson MRN: [id number] Date of Birth: 18-Apr-1970 Referring Provider (PT): Dr Ophelia Charter    Encounter Date: 11/19/2019   PT End of Session - 11/19/19 0804    Visit Number 8    Number of Visits 24    Date for PT Re-Evaluation 01/10/20    PT Start Time 0804    PT Stop Time 0845    PT Time Calculation (min) 41 min    Activity Tolerance Patient tolerated treatment well    Behavior During Therapy [facility] for tasks assessed/performed           Past Medical History:  Diagnosis Date  . ADD (attention deficit disorder) 06/14/2012  . Anxiety   . Bronchitis   . Complication of anesthesia 1987   states woke up during appedentomy  . Depression 06/14/2012  . Hepatic steatosis 11/13/2018   Hepatic steatosis with hepatomegaly seen on MRI abd on 11/2018  . History of leukocytosis 06/14/2012   Reports "WBC 32" decades ago with negative hematology specialist workup.   [name] Hyperlipidemia 06/14/2012  . Seasonal allergies 06/14/2012    Past Surgical History:  Procedure Laterality Date  . APPENDECTOMY    . BICEPT TENODESIS Right 10/10/2019   Procedure: BICEPS TENODESIS;  Surgeon: Hiram Gash, MD;  Location: [place];  Service: Orthopedics;  Laterality: Right;  . bladder sling/mesh    . INGUINAL HERNIA REPAIR    . intrauterine ablation    . NASAL SINUS SURGERY    . TUBAL LIGATION      There were no vitals filed for this visit.   Subjective Assessment - 11/19/19 0804    Subjective It's doing better. I'm able to raise my arm to shouder level now.    Pertinent History unremarkable per pt report    Currently in Pain? No/denies                             [facility] Adult PT Treatment/Exercise - 11/19/19 0001      Shoulder Exercises: Standing   Extension  Strengthening;Both;10 reps;Theraband    Theraband Level (Shoulder Extension) Level 2 (Red)    Row Strengthening;Both;10 reps;Theraband    Theraband Level (Shoulder Row) Level 2 (Red)    Other Standing Exercises scap squeeze 10 sec x 10 reps with foam roll along spine       Shoulder Exercises: ROM/Strengthening   Pendulum Rt UE hanging to 90 deg - 20-30 CW/CCW    Other ROM/Strengthening Exercises wall slides x 3 10 sec hold; then wall ladder x 2 10 sec hold #26      Shoulder Exercises: Isometric Strengthening   Flexion 5X10"    Extension 5X10"    External Rotation 5X10"    External Rotation Limitations also with red tband elbow at 90 deg 2 step outs 3x10 sec    Internal Rotation 5X10"    Internal Rotation Limitations with red band step outs elbow at side 90 deg    ABduction 5X10"      Shoulder Exercises: Stretch   External Rotation Stretch 5 reps;10 seconds   PROM standing back against noodle elbow at side cane ER    Table Stretch - Flexion 5 reps;10 seconds    Table Stretch -Flexion Limitations step back hands resting on  counter to stretch into flexion 10 sec hold x 5 reps     Other Shoulder Stretches supine lying with Rt UE flat along treatment table to stretch through pecs and anterior shoulder       Manual Therapy   Manual Therapy Soft tissue mobilization;Myofascial release    Manual therapy comments supine and prone    Soft tissue mobilization to pecs, deltoids, lateral scapular muscles;     Myofascial Release to post deltoid and infraspinatus                    PT Short Term Goals - 10/25/19 1619      PT SHORT TERM GOAL #1   Title Patient independent in initial HEP    Time 6    Period Weeks    Status New    Target Date 12/06/19      PT SHORT TERM GOAL #2   Title Increase PROM Rt shoulder as protocol allows    Time 6    Period Weeks    Status New    Target Date 12/06/19      PT SHORT TERM GOAL #3   Title Improve posture and alignment with patient  demonstrating improved upright posture with posterior shoulder girdle engaged    Time 6    Period Weeks    Status New    Target Date 12/06/19             PT Long Term Goals - 10/25/19 1626      PT LONG TERM GOAL #1   Title AROM Rt shoulder to WFL's and pain free    Time 12    Period Weeks    Status New    Target Date 01/10/20      PT LONG TERM GOAL #2   Title 4+/5 to 5/5 strength Rt shoulder    Time 12    Period Weeks    Status New    Target Date 01/10/20      PT LONG TERM GOAL #3   Title patient reports that she has returned to normal functional activities    Time 12    Period Weeks    Status New    Target Date 01/10/20      PT LONG TERM GOAL #4   Title Independent in HEP    Time 12    Period Weeks    Status New    Target Date 01/10/20      PT LONG TERM GOAL #5   Title Improve FOTO to </= 44% limitation    Time 12    Period Weeks    Status New    Target Date 01/10/20                 Plan - 11/19/19 1407    Clinical Impression Statement Patient continuing to progress. Still tender in lateral scapular muscles. body mechanics reviewed for pendulum and squatting at work to protect low back.    PT Treatment/Interventions Patient/family education;ADLs/Self Care Home Management;Aquatic Therapy;Cryotherapy;Electrical Stimulation;Iontophoresis 4mg /ml Dexamethasone;Moist Heat;Ultrasound;Neuromuscular re-education;Therapeutic exercise;Therapeutic activities;Manual techniques;Dry needling;Passive range of motion;Taping    PT Next Visit Plan review HEP; progress with PROM; progress with shoulder rehab per protocol; does not like TENS and vaso using ice at home as needed for pain management    PT Saline; HEP           Patient will benefit from skilled therapeutic intervention in order to improve the following deficits and impairments:  Decreased range of motion, Increased muscle spasms, Impaired UE functional use, Decreased activity  tolerance, Pain, Impaired flexibility, Improper body mechanics, Decreased mobility, Decreased strength, Postural dysfunction  Visit Diagnosis: Acute pain of right shoulder  Abnormal posture  Muscle weakness (generalized)  Other symptoms and signs involving the musculoskeletal system     Problem List Patient Active Problem List   Diagnosis Date Noted  . 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 Hepatic steatosis 11/13/2018  . Tobacco use 07/18/2018  . Sun-induced skin changes, keratosis 08/08/2017  . Subcutaneous cyst 08/08/2017  . Irritable bowel syndrome (IBS) 05/04/2017  . Generalized anxiety disorder 03/16/2016  . Social anxiety disorder 03/16/2016  . Major depressive disorder, recurrent episode, moderate ([place]) 03/16/2016  . Right inguinal hernia 07/14/2015  . Hallux valgus 12/09/2014  . Acute headache 07/31/2014  . Chest pain 07/31/2014  . Hypotension 07/31/2014  . Other and unspecified ovarian cyst 06/25/2013  . Left tennis elbow 06/25/2013  . Left breast mass 06/27/2012  . Microscopic hematuria 06/27/2012  . Hyperlipidemia 06/14/2012  . Prediabetes 06/14/2012  . History of leukocytosis 06/14/2012  . Seasonal allergies 06/14/2012  . Depression 06/14/2012  . ADD (attention deficit disorder) 06/14/2012   Madelyn Flavors PT 11/19/2019, 2:09 PM  [facility] [place], [place], [address] Phone: [phone]   Fax:  [phone]  Name: Sarah Nicholson MRN: [id number] Date of Birth: May 26, 1970

## 2019-11-21 ENCOUNTER — Other Ambulatory Visit: Payer: Self-pay

## 2019-11-21 ENCOUNTER — Ambulatory Visit (INDEPENDENT_AMBULATORY_CARE_PROVIDER_SITE_OTHER): Payer: 59 | Admitting: Physical Therapy

## 2019-11-21 DIAGNOSIS — M6281 Muscle weakness (generalized): Secondary | ICD-10-CM | POA: Diagnosis not present

## 2019-11-21 DIAGNOSIS — R293 Abnormal posture: Secondary | ICD-10-CM | POA: Diagnosis not present

## 2019-11-21 DIAGNOSIS — R29898 Other symptoms and signs involving the musculoskeletal system: Secondary | ICD-10-CM

## 2019-11-21 DIAGNOSIS — M25511 Pain in right shoulder: Secondary | ICD-10-CM

## 2019-11-21 NOTE — Therapy (Signed)
[place], [place], [address] Phone: [phone]   Fax:  [phone]  Physical Therapy Treatment  Patient Details  Name: Sarah Nicholson MRN: [id number] Date of Birth: 1971/01/29 Referring Provider (PT): Dr Ophelia Charter    Encounter Date: 11/21/2019   PT End of Session - 11/21/19 1750    Visit Number 9    Number of Visits 24    Date for PT Re-Evaluation 01/10/20    PT Start Time 0254    PT Stop Time 1749    PT Time Calculation (min) 44 min    Activity Tolerance Patient tolerated treatment well    Behavior During Therapy [facility] for tasks assessed/performed           Past Medical History:  Diagnosis Date  . ADD (attention deficit disorder) 06/14/2012  . Anxiety   . Bronchitis   . Complication of anesthesia 1987   states woke up during appedentomy  . Depression 06/14/2012  . Hepatic steatosis 11/13/2018   Hepatic steatosis with hepatomegaly seen on MRI abd on 11/2018  . History of leukocytosis 06/14/2012   Reports "WBC 32" decades ago with negative hematology specialist workup.   [name] Hyperlipidemia 06/14/2012  . Seasonal allergies 06/14/2012    Past Surgical History:  Procedure Laterality Date  . APPENDECTOMY    . BICEPT TENODESIS Right 10/10/2019   Procedure: BICEPS TENODESIS;  Surgeon: Hiram Gash, MD;  Location: [facility];  Service: Orthopedics;  Laterality: Right;  . bladder sling/mesh    . INGUINAL HERNIA REPAIR    . intrauterine ablation    . NASAL SINUS SURGERY    . TUBAL LIGATION      There were no vitals filed for this visit.   Subjective Assessment - 11/21/19 1710    Subjective Pt reports her Rt shoulder is sore from reaching and moving her arm at work.    Patient Stated Goals move arm; use arm for normal functional and work activities    Currently in Pain? Yes    Pain Score 2     Pain Location Shoulder    Pain Orientation Right;Posterior;Lateral    Pain  Descriptors / Indicators Aching;Sore    Aggravating Factors  ?    Pain Relieving Factors ice              [facility] PT Assessment - 11/21/19 0001      Assessment   Medical Diagnosis Rt biceps tenodesis; SAD; DCR     Referring Provider (PT) Dr Ophelia Charter     Onset Date/Surgical Date 10/10/19    Hand Dominance Right    Prior Therapy yes PT for shd at another facility      AROM   Right/Left Shoulder Right    Right Shoulder Extension 23 Degrees    Right Shoulder Flexion 127 Degrees    Right Shoulder ABduction 75 Degrees   with discomfort   Right Shoulder Internal Rotation --   back of palm to lateral RLE.      PROM   Right Shoulder External Rotation 60 Degrees   in scapular plane, elbow at 90 deg flexion           [facility] Adult PT Treatment/Exercise - 11/21/19 0001      Shoulder Exercises: Supine   External Rotation AAROM;Right;10 reps   5-10 sec hold   Flexion AROM;Right;5 reps      Shoulder Exercises: Prone   Flexion Right;AROM;5 reps  Extension Right;AROM;10 reps    Other Prone Exercises Rt prone row x 10 reps      Shoulder Exercises: Standing   Extension Strengthening;Both;10 reps    Theraband Level (Shoulder Extension) Level 2 (Red)    Row Strengthening;Both;10 reps;Theraband    Theraband Level (Shoulder Row) Level 2 (Red)    Other Standing Exercises pendulum x 10 with RUE      Shoulder Exercises: Pulleys   Flexion 3 minutes    Scaption 3 minutes   limited tolerance     Manual Therapy   Manual Therapy Soft tissue mobilization;Passive ROM;Taping    Manual therapy comments I strip of [place] tape applied to Rt tricep and a perpendicular strip applied to mid lateral tricep to decrease pain, decompress tissue and increase proprioception.     Soft tissue mobilization IASTM to Rt tricep, posterior and lateral deltoid and infraspinatus to decrease fascia restrictions and improve ROM     Passive ROM PROM into scaption, flexion, ER     Kinesiotex Create Space                      PT Short Term Goals - 11/21/19 2042      PT SHORT TERM GOAL #1   Title Patient independent in initial HEP    Time 6    Period Weeks    Status Achieved    Target Date 12/06/19      PT SHORT TERM GOAL #2   Title Increase PROM Rt shoulder as protocol allows    Time 6    Period Weeks    Status Partially Met    Target Date 12/06/19      PT SHORT TERM GOAL #3   Title Improve posture and alignment with patient demonstrating improved upright posture with posterior shoulder girdle engaged    Time 6    Period Weeks    Status On-going    Target Date 12/06/19             PT Long Term Goals - 11/21/19 2043      PT LONG TERM GOAL #1   Title AROM Rt shoulder to WFL's and pain free    Time 12    Period Weeks    Status On-going      PT LONG TERM GOAL #2   Title 4+/5 to 5/5 strength Rt shoulder    Time 12    Period Weeks    Status On-going      PT LONG TERM GOAL #3   Title patient reports that she has returned to normal functional activities    Time 12    Period Weeks    Status On-going      PT LONG TERM GOAL #4   Title Independent in HEP    Time 12    Period Weeks    Status On-going      PT LONG TERM GOAL #5   Title Improve FOTO to </= 44% limitation    Time 12    Period Weeks    Status On-going                 Plan - 11/21/19 2038    Clinical Impression Statement Pt guarded with PROM, but tolerated IASTM well.  Palpable tightness/tenderness in Rt lateral tricep; improved after IASTM and applied a trial of ktape to area of discomfort.  Pt's PROM/ AROM gradually improving.  Progressing towards STG/LTGs.    PT Treatment/Interventions Patient/family education;ADLs/Self Care Home  Management;Aquatic Therapy;Cryotherapy;Electrical Stimulation;Iontophoresis 50m/ml Dexamethasone;Moist Heat;Ultrasound;Neuromuscular re-education;Therapeutic exercise;Therapeutic activities;Manual techniques;Dry needling;Passive range of motion;Taping    PT Next Visit  Plan Assess response to tape.  progress with PROM/AROM; progress with shoulder rehab per protocol; does not like TENS and vaso using ice at home as needed for pain management    PT [place] HEP           Patient will benefit from skilled therapeutic intervention in order to improve the following deficits and impairments:  Decreased range of motion, Increased muscle spasms, Impaired UE functional use, Decreased activity tolerance, Pain, Impaired flexibility, Improper body mechanics, Decreased mobility, Decreased strength, Postural dysfunction  Visit Diagnosis: Acute pain of right shoulder  Abnormal posture  Muscle weakness (generalized)  Other symptoms and signs involving the musculoskeletal system     Problem List Patient Active Problem List   Diagnosis Date Noted  . 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 Hepatic steatosis 11/13/2018  . Tobacco use 07/18/2018  . Sun-induced skin changes, keratosis 08/08/2017  . Subcutaneous cyst 08/08/2017  . Irritable bowel syndrome (IBS) 05/04/2017  . Generalized anxiety disorder 03/16/2016  . Social anxiety disorder 03/16/2016  . Major depressive disorder, recurrent episode, moderate ([place] 03/16/2016  . Right inguinal hernia 07/14/2015  . Hallux valgus 12/09/2014  . Acute headache 07/31/2014  . Chest pain 07/31/2014  . Hypotension 07/31/2014  . Other and unspecified ovarian cyst 06/25/2013  . Left tennis elbow 06/25/2013  . Left breast mass 06/27/2012  . Microscopic hematuria 06/27/2012  . Hyperlipidemia 06/14/2012  . Prediabetes 06/14/2012  . History of leukocytosis 06/14/2012  . Seasonal allergies 06/14/2012  . Depression 06/14/2012  . ADD (attention deficit disorder) 06/14/2012   JKerin Perna PTA 11/21/19 8:43 PM  [place] [address]Phone: [phone]  Fax:  [phone] Name:  CPurva VessellMRN: [id number] of Birth: [birth year]

## 2019-11-21 NOTE — Patient Instructions (Signed)

## 2019-11-25 ENCOUNTER — Other Ambulatory Visit: Payer: Self-pay

## 2019-11-25 ENCOUNTER — Ambulatory Visit (INDEPENDENT_AMBULATORY_CARE_PROVIDER_SITE_OTHER): Payer: 59 | Admitting: Rehabilitative and Restorative Service Providers"

## 2019-11-25 ENCOUNTER — Encounter: Payer: Self-pay | Admitting: Rehabilitative and Restorative Service Providers"

## 2019-11-25 DIAGNOSIS — R293 Abnormal posture: Secondary | ICD-10-CM

## 2019-11-25 DIAGNOSIS — R29898 Other symptoms and signs involving the musculoskeletal system: Secondary | ICD-10-CM

## 2019-11-25 DIAGNOSIS — M6281 Muscle weakness (generalized): Secondary | ICD-10-CM | POA: Diagnosis not present

## 2019-11-25 DIAGNOSIS — M25511 Pain in right shoulder: Secondary | ICD-10-CM | POA: Diagnosis not present

## 2019-11-25 NOTE — Patient Instructions (Signed)
Supine rhythmic stabilization  scap engaged fwd/back; horizontal ab/ad; circles CW/CCW  15 reps each

## 2019-11-25 NOTE — Therapy (Signed)
[place], [place], [address] Phone: [phone]   Fax:  [phone]  Physical Therapy Treatment  Patient Details  Name: Sarah Nicholson MRN: [id number] Date of Birth: [birth year] Referring Provider (PT): Dr Ophelia Charter    Encounter Date: 11/25/2019   PT End of Session - 11/25/19 1650    Visit Number 10    Number of Visits 24    Date for PT Re-Evaluation 01/10/20    PT Start Time 1601    PT Stop Time 1645    PT Time Calculation (min) 44 min    Activity Tolerance Patient tolerated treatment well           Past Medical History:  Diagnosis Date  . ADD (attention deficit disorder) 06/14/2012  . Anxiety   . Bronchitis   . Complication of anesthesia 1987   states woke up during appedentomy  . Depression 06/14/2012  . Hepatic steatosis 11/13/2018   Hepatic steatosis with hepatomegaly seen on MRI abd on 11/2018  . History of leukocytosis 06/14/2012   Reports "WBC 32" decades ago with negative hematology specialist workup.   [name] Hyperlipidemia 06/14/2012  . Seasonal allergies 06/14/2012    Past Surgical History:  Procedure Laterality Date  . APPENDECTOMY    . BICEPT TENODESIS Right 10/10/2019   Procedure: BICEPS TENODESIS;  Surgeon: Hiram Gash, MD;  Location: [facility];  Service: Orthopedics;  Laterality: Right;  . bladder sling/mesh    . INGUINAL HERNIA REPAIR    . intrauterine ablation    . NASAL SINUS SURGERY    . TUBAL LIGATION      There were no vitals filed for this visit.   Subjective Assessment - 11/25/19 1606    Subjective Rt shoulder pain continues and is more due to work - 15 straight days of at least 9 hour days. She is sitll working on her exercises at home.    Currently in Pain? Yes    Pain Score 3     Pain Location Shoulder    Pain Orientation Right;Posterior;Lateral    Pain Descriptors / Indicators Aching;Sore    Pain Type Surgical pain;Chronic pain               [facility] PT Assessment - 11/25/19 0001      Assessment   Medical Diagnosis Rt biceps tenodesis; SAD; DCR     Referring Provider (PT) Dr Ophelia Charter     Onset Date/Surgical Date 10/10/19    Hand Dominance Right    Prior Therapy yes PT for shd at another facility      PROM   Right Shoulder Flexion 146 Degrees    Right Shoulder ABduction 146 Degrees    Right Shoulder External Rotation 65 Degrees   in scapular plane; neutral; elbow 90 deg flexion     Palpation   Palpation comment muscular tightness Rt pecs; anterior deltoid; biceps; upper trap; levator; teres; lats                          [facility] Adult PT Treatment/Exercise - 11/25/19 0001      Shoulder Exercises: Supine   Other Supine Exercises chest lift 5 sec hold x 10     Other Supine Exercises rhythmic stabilization shoulder 90 deg flexion flex/ext; horizontal ab/add; circles CW/CCW x 15 each       Shoulder Exercises: Standing   Extension Strengthening;Both;10 reps    Theraband Level (  Shoulder Extension) Level 2 (Red)    Row Strengthening;Both;10 reps;Theraband    Theraband Level (Shoulder Row) Level 2 (Red)    Other Standing Exercises pendulum x 10 with RUE    Other Standing Exercises scap squeeze 10 sec x 5 reps x 2 sets       Shoulder Exercises: Pulleys   Flexion 2 minutes    Scaption 2 minutes      Shoulder Exercises: Therapy Ball   Flexion Right;15 reps   8 inch ball on wall    Flexion Limitations moving ball up and down; side to side; circles CW/CCW       Manual Therapy   Manual Therapy Soft tissue mobilization;Passive ROM;Taping    Manual therapy comments pt supine     Joint Mobilization circumduction of [place] movement     Soft tissue mobilization Rt upper quarter - pecs; upper trap; leveator; anterior and middle deltoid; biceps;     Myofascial Release to anterior deltoid/biceps     Scapular Mobilization Rt lateral glide     Passive ROM Rt shoulder flexion; scaption; ER to tissue limits no pain                    PT Education - 11/25/19 1645    Education Details HEP rhythmic stabilization supine    Person(s) Educated Patient    Methods Explanation;Demonstration;Tactile cues;Verbal cues    Comprehension Verbalized understanding;Returned demonstration;Verbal cues required;Tactile cues required            PT Short Term Goals - 11/21/19 2042      PT SHORT TERM GOAL #1   Title Patient independent in initial HEP    Time 6    Period Weeks    Status Achieved    Target Date 12/06/19      PT SHORT TERM GOAL #2   Title Increase PROM Rt shoulder as protocol allows    Time 6    Period Weeks    Status Partially Met    Target Date 12/06/19      PT SHORT TERM GOAL #3   Title Improve posture and alignment with patient demonstrating improved upright posture with posterior shoulder girdle engaged    Time 6    Period Weeks    Status On-going    Target Date 12/06/19             PT Long Term Goals - 11/21/19 2043      PT LONG TERM GOAL #1   Title AROM Rt shoulder to WFL's and pain free    Time 12    Period Weeks    Status On-going      PT LONG TERM GOAL #2   Title 4+/5 to 5/5 strength Rt shoulder    Time 12    Period Weeks    Status On-going      PT LONG TERM GOAL #3   Title patient reports that she has returned to normal functional activities    Time 12    Period Weeks    Status On-going      PT LONG TERM GOAL #4   Title Independent in HEP    Time 12    Period Weeks    Status On-going      PT LONG TERM GOAL #5   Title Improve FOTO to </= 44% limitation    Time 12    Period Weeks    Status On-going  Plan - 11/25/19 1650    Clinical Impression Statement Increased muscular tightness with increased demands of work and daily work schedule. Continued work with AAROM; PROM; stretching; posterior shoulder girdle strengthening; postural correctionl Progressing per protocol.    Rehab Potential Good    PT Frequency 2x / week    PT  Duration 6 weeks    PT Treatment/Interventions Patient/family education;ADLs/Self Care Home Management;Aquatic Therapy;Cryotherapy;Electrical Stimulation;Iontophoresis 30m/ml Dexamethasone;Moist Heat;Ultrasound;Neuromuscular re-education;Therapeutic exercise;Therapeutic activities;Manual techniques;Dry needling;Passive range of motion;Taping    PT Next Visit Plan progress with PROM/AROM; progress with shoulder rehab per protocol; does not like TENS and vaso using ice at home as needed for pain management    PT [place] HEP    Recommended Other Services .    Consulted and Agree with Plan of Care Patient           Patient will benefit from skilled therapeutic intervention in order to improve the following deficits and impairments:     Visit Diagnosis: Acute pain of right shoulder  Abnormal posture  Muscle weakness (generalized)  Other symptoms and signs involving the musculoskeletal system     Problem List Patient Active Problem List   Diagnosis Date Noted  . 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 Hepatic steatosis 11/13/2018  . Tobacco use 07/18/2018  . Sun-induced skin changes, keratosis 08/08/2017  . Subcutaneous cyst 08/08/2017  . Irritable bowel syndrome (IBS) 05/04/2017  . Generalized anxiety disorder 03/16/2016  . Social anxiety disorder 03/16/2016  . Major depressive disorder, recurrent episode, moderate ([place] 03/16/2016  . Right inguinal hernia 07/14/2015  . Hallux valgus 12/09/2014  . Acute headache 07/31/2014  . Chest pain 07/31/2014  . Hypotension 07/31/2014  . Other and unspecified ovarian cyst 06/25/2013  . Left tennis elbow 06/25/2013  . Left breast mass 06/27/2012  . Microscopic hematuria 06/27/2012  . Hyperlipidemia 06/14/2012  . Prediabetes 06/14/2012  . History of leukocytosis 06/14/2012  . Seasonal allergies 06/14/2012  . Depression 06/14/2012  . ADD (attention deficit disorder) 06/14/2012    Breckin Savannah PNilda SimmerPT, MPH  11/25/2019, 4:54 PM  [facility] [address]Phone: [phone]  Fax:  [phone] Name: CLoney PetoMRN: [id number] of Birth: [birth year]

## 2019-11-28 ENCOUNTER — Other Ambulatory Visit: Payer: Self-pay

## 2019-11-28 ENCOUNTER — Ambulatory Visit (INDEPENDENT_AMBULATORY_CARE_PROVIDER_SITE_OTHER): Payer: 59 | Admitting: Physical Therapy

## 2019-11-28 ENCOUNTER — Encounter: Payer: Self-pay | Admitting: Physical Therapy

## 2019-11-28 DIAGNOSIS — R29898 Other symptoms and signs involving the musculoskeletal system: Secondary | ICD-10-CM

## 2019-11-28 DIAGNOSIS — R293 Abnormal posture: Secondary | ICD-10-CM

## 2019-11-28 DIAGNOSIS — M25511 Pain in right shoulder: Secondary | ICD-10-CM

## 2019-11-28 DIAGNOSIS — M6281 Muscle weakness (generalized): Secondary | ICD-10-CM

## 2019-11-28 NOTE — Patient Instructions (Signed)
Access Code: [email or web address]: [email or web address]: 08/30/2021Prepared by: [place]  Standing Shoulder External Rotation AAROM with Dowel - 2 x daily - 7 x weekly - 1 sets - 5-10 reps - 10 sec hold  Seated Shoulder Flexion Towel Slide at Table Top - 3 x daily - 7 x weekly - 1 sets - 5-10 reps - 10 sec hold  Circular Shoulder Pendulum with Table Support - 2 x daily - 7 x weekly - 1 sets - 3 reps - 30 sec hold  Supine Scapular Retraction - 2 x daily - 7 x weekly - 1 sets - 10 reps - 5-10 sec hold  Isometric Shoulder Extension at Wall - 2 x daily - 7 x weekly - 1 sets - 5-10 reps - 5 sec hold  Isometric Shoulder Abduction at Wall - 2 x daily - 7 x weekly - 1 sets - 5-10 reps - 5 sec hold  Standing Isometric Shoulder External Rotation with Doorway - 2 x daily - 7 x weekly - 1 sets - 5-10 reps - 5 sec hold  Standing Shoulder and Trunk Flexion at Table - 2 x daily - 7 x weekly - 1 sets - 5 reps - 10-15 sec hold  Standing Isometric Shoulder Flexion with Doorway - Arm Bent - 2 x daily - 7 x weekly - 1 sets - 5-10 reps - 5 sec hold  Standing Bilateral Low Shoulder Row with Anchored Resistance - 2 x daily - 7 x weekly - 1-3 sets - 10 reps - 2-3 sec hold  Shoulder Extension with Resistance - 2 x daily - 7 x weekly - 1-3 sets - 10 reps - 2-3 sec hold  Standing Shoulder Internal Rotation AAROM with Dowel - 1 x daily - 7 x weekly - 1 sets - 10 reps

## 2019-11-28 NOTE — Therapy (Signed)
[place], [place], [address] Phone: [phone]   Fax:  [phone]  Physical Therapy Treatment  Patient Details  Name: Sarah Nicholson MRN: [id number] Date of Birth: [birth year] Referring Provider (PT): Dr Ophelia Charter    Encounter Date: 11/28/2019   PT End of Session - 11/28/19 1709    Visit Number 11    Number of Visits 24    Date for PT Re-Evaluation 01/10/20    PT Start Time 1324    PT Stop Time 1746    PT Time Calculation (min) 42 min    Activity Tolerance Patient tolerated treatment well           Past Medical History:  Diagnosis Date  . ADD (attention deficit disorder) 06/14/2012  . Anxiety   . Bronchitis   . Complication of anesthesia 1987   states woke up during appedentomy  . Depression 06/14/2012  . Hepatic steatosis 11/13/2018   Hepatic steatosis with hepatomegaly seen on MRI abd on 11/2018  . History of leukocytosis 06/14/2012   Reports "WBC 32" decades ago with negative hematology specialist workup.   [name] Hyperlipidemia 06/14/2012  . Seasonal allergies 06/14/2012    Past Surgical History:  Procedure Laterality Date  . APPENDECTOMY    . BICEPT TENODESIS Right 10/10/2019   Procedure: BICEPS TENODESIS;  Surgeon: Hiram Gash, MD;  Location: [facility];  Service: Orthopedics;  Laterality: Right;  . bladder sling/mesh    . INGUINAL HERNIA REPAIR    . intrauterine ablation    . NASAL SINUS SURGERY    . TUBAL LIGATION      There were no vitals filed for this visit.   Subjective Assessment - 11/28/19 1709    Subjective Pt reports no new changes.  Still using Rt arm at work, but no lifting more than 32 oz.    Patient Stated Goals move arm; use arm for normal functional and work activities    Currently in Pain? Yes    Pain Score 2     Pain Location Shoulder    Pain Orientation Right;Lateral    Pain Descriptors / Indicators Aching;Sore    Aggravating Factors  ?      Pain Relieving Factors ice              [facility] PT Assessment - 11/28/19 0001      Assessment   Medical Diagnosis Rt biceps tenodesis; SAD; DCR     Referring Provider (PT) Dr Ophelia Charter     Onset Date/Surgical Date 10/10/19    Hand Dominance Right    Prior Therapy yes PT for shd at another facility      AROM   Right Shoulder Extension 30 Degrees    Right Shoulder Flexion 140 Degrees    Right Shoulder ABduction 135 Degrees    Right Shoulder Internal Rotation --   thumb to L4 during [facility] Adult PT Treatment/Exercise - 11/28/19 0001      Shoulder Exercises: Standing   Internal Rotation AAROM;Right;10 reps   holding pool noodle   Flexion AROM;Right;5 reps    ABduction Right;5 reps;AROM   scaption, to tolerance   Extension AAROM;Both;5 reps   cane   Other Standing Exercises pendulum x 10 with RUE    Other Standing Exercises shoulder rolls x 10.  trial of squat and return to neutral with core engaged x 5 reps  Shoulder Exercises: Pulleys   Flexion 2 minutes    Scaption 2 minutes      Shoulder Exercises: Stretch   Corner Stretch 3 reps;10 seconds;20 seconds   low/middle position   Warehouse manager Limitations 1 trial at door frame (bilat- not tolerated yet), and unilateral at wall with elbow bent to 90 deg x 15 sec     Internal Rotation Stretch 2 reps   10 sec, LUE assisting RUE behind back   Other Shoulder Stretches Rt bicep brachii stretch holding elevated mat table and stepping forward with RLE x 20 sec x 3 reps     Other Shoulder Stretches L stretch with hands on counter, walking legs back for shoulder and back stretch, x 10 sec x 3 reps       Manual Therapy   Manual therapy comments I strip of [place] tape applied to Rt tricep and a perpendicular strip applied to mid lateral tricep to decrease pain, decompress tissue and increase proprioception.     Soft tissue mobilization IASTM to Rt bicep brachii, tricep, posterior and lateral deltoid and  infraspinatus to decrease fascia restrictions and improve ROM               PT Education - 11/28/19 1807    Education Details HEP- added shoulder ext and IR stretches.    Person(s) Educated Patient    Methods Explanation    Comprehension Verbalized understanding            PT Short Term Goals - 11/21/19 2042      PT SHORT TERM GOAL #1   Title Patient independent in initial HEP    Time 6    Period Weeks    Status Achieved    Target Date 12/06/19      PT SHORT TERM GOAL #2   Title Increase PROM Rt shoulder as protocol allows    Time 6    Period Weeks    Status Partially Met    Target Date 12/06/19      PT SHORT TERM GOAL #3   Title Improve posture and alignment with patient demonstrating improved upright posture with posterior shoulder girdle engaged    Time 6    Period Weeks    Status On-going    Target Date 12/06/19             PT Long Term Goals - 11/21/19 2043      PT LONG TERM GOAL #1   Title AROM Rt shoulder to WFL's and pain free    Time 12    Period Weeks    Status On-going      PT LONG TERM GOAL #2   Title 4+/5 to 5/5 strength Rt shoulder    Time 12    Period Weeks    Status On-going      PT LONG TERM GOAL #3   Title patient reports that she has returned to normal functional activities    Time 12    Period Weeks    Status On-going      PT LONG TERM GOAL #4   Title Independent in HEP    Time 12    Period Weeks    Status On-going      PT LONG TERM GOAL #5   Title Improve FOTO to </= 44% limitation    Time 12    Period Weeks    Status On-going  Plan - 11/28/19 1755    Clinical Impression Statement Pt demonstrated good improvement in Rt shoulder AROM. Rt shoulder ext AROM improved from 23 deg to 30 degrees after bicep stretch.  Pt reported some discomfort relief with use of ktape to shoulder; repeated today.  Progressing well within rehab protocol (at 7 wks).  Pt returns to MD next week.    Rehab Potential Good     PT Frequency 2x / week    PT Duration 6 weeks    PT Treatment/Interventions Patient/family education;ADLs/Self Care Home Management;Aquatic Therapy;Cryotherapy;Electrical Stimulation;Iontophoresis 47m/ml Dexamethasone;Moist Heat;Ultrasound;Neuromuscular re-education;Therapeutic exercise;Therapeutic activities;Manual techniques;Dry needling;Passive range of motion;Taping    PT Next Visit Plan progress with PROM/AROM; progress with shoulder rehab per protocol; does not like TENS and vaso using ice at home as needed for pain management;  MD note.    PT Home Exercise Plan [id number] HEP    Consulted and Agree with Plan of Care Patient           Patient will benefit from skilled therapeutic intervention in order to improve the following deficits and impairments:  Decreased range of motion, Increased muscle spasms, Impaired UE functional use, Decreased activity tolerance, Pain, Impaired flexibility, Improper body mechanics, Decreased mobility, Decreased strength, Postural dysfunction  Visit Diagnosis: Acute pain of right shoulder  Abnormal posture  Muscle weakness (generalized)  Other symptoms and signs involving the musculoskeletal system     Problem List Patient Active Problem List   Diagnosis Date Noted  . 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 Hepatic steatosis 11/13/2018  . Tobacco use 07/18/2018  . Sun-induced skin changes, keratosis 08/08/2017  . Subcutaneous cyst 08/08/2017  . Irritable bowel syndrome (IBS) 05/04/2017  . Generalized anxiety disorder 03/16/2016  . Social anxiety disorder 03/16/2016  . Major depressive disorder, recurrent episode, moderate ([place] 03/16/2016  . Right inguinal hernia 07/14/2015  . Hallux valgus 12/09/2014  . Acute headache 07/31/2014  . Chest pain 07/31/2014  . Hypotension 07/31/2014  . Other and unspecified ovarian cyst 06/25/2013  . Left tennis elbow 06/25/2013  . Left breast mass 06/27/2012  . Microscopic  hematuria 06/27/2012  . Hyperlipidemia 06/14/2012  . Prediabetes 06/14/2012  . History of leukocytosis 06/14/2012  . Seasonal allergies 06/14/2012  . Depression 06/14/2012  . ADD (attention deficit disorder) 06/14/2012   JKerin Perna PTA 11/28/19 6:09 PM  [place] [address]Phone: [phone]  Fax:  [phone] Name: CMaryruth AppleMRN: [id number] of Birth: [birth year]

## 2019-12-04 ENCOUNTER — Ambulatory Visit (INDEPENDENT_AMBULATORY_CARE_PROVIDER_SITE_OTHER): Payer: 59 | Admitting: Physical Therapy

## 2019-12-04 ENCOUNTER — Other Ambulatory Visit: Payer: Self-pay

## 2019-12-04 DIAGNOSIS — R29898 Other symptoms and signs involving the musculoskeletal system: Secondary | ICD-10-CM

## 2019-12-04 DIAGNOSIS — R293 Abnormal posture: Secondary | ICD-10-CM | POA: Diagnosis not present

## 2019-12-04 DIAGNOSIS — M25511 Pain in right shoulder: Secondary | ICD-10-CM

## 2019-12-04 DIAGNOSIS — M6281 Muscle weakness (generalized): Secondary | ICD-10-CM

## 2019-12-04 NOTE — Therapy (Signed)
[place], [place], [address] Phone: [phone]   Fax:  [phone]  Physical Therapy Treatment  Patient Details  Name: Sarah Nicholson MRN: [id number] Date of Birth: [birth year] Referring Provider (PT): Dr Ophelia Charter    Encounter Date: 12/04/2019   PT End of Session - 12/04/19 1520    Visit Number 12    Number of Visits 24    Date for PT Re-Evaluation 01/10/20    PT Start Time 8099    PT Stop Time 1555    PT Time Calculation (min) 38 min    Activity Tolerance Patient tolerated treatment well    Behavior During Therapy [facility] for tasks assessed/performed           Past Medical History:  Diagnosis Date  . ADD (attention deficit disorder) 06/14/2012  . Anxiety   . Bronchitis   . Complication of anesthesia 1987   states woke up during appedentomy  . Depression 06/14/2012  . Hepatic steatosis 11/13/2018   Hepatic steatosis with hepatomegaly seen on MRI abd on 11/2018  . History of leukocytosis 06/14/2012   Reports "WBC 32" decades ago with negative hematology specialist workup.   [name] Hyperlipidemia 06/14/2012  . Seasonal allergies 06/14/2012    Past Surgical History:  Procedure Laterality Date  . APPENDECTOMY    . BICEPT TENODESIS Right 10/10/2019   Procedure: BICEPS TENODESIS;  Surgeon: Hiram Gash, MD;  Location: [facility];  Service: Orthopedics;  Laterality: Right;  . bladder sling/mesh    . INGUINAL HERNIA REPAIR    . intrauterine ablation    . NASAL SINUS SURGERY    . TUBAL LIGATION      There were no vitals filed for this visit.       [facility] PT Assessment - 12/04/19 0001      Assessment   Medical Diagnosis Rt biceps tenodesis; SAD; DCR     Referring Provider (PT) Dr Ophelia Charter     Onset Date/Surgical Date 10/10/19    Hand Dominance Right    Prior Therapy yes PT for shd at another facility      AROM   Right Shoulder Extension 35 Degrees    Right Shoulder  Flexion 141 Degrees      PROM   Right Shoulder Flexion 150 Degrees    Right Shoulder External Rotation 65 Degrees   in scapular plane, elbow at 90 deg           [facility] Adult PT Treatment/Exercise - 12/04/19 0001      Elbow Exercises   Elbow Flexion Strengthening;Right;10 reps;Seated;Bar weights/barbell   2#      Shoulder Exercises: Supine   Horizontal ABduction Strengthening;Both;10 reps    Theraband Level (Shoulder Horizontal ABduction) Level 1 (Yellow)    External Rotation Both;5 reps;Theraband    Theraband Level (Shoulder External Rotation) Level 1 (Yellow)    Flexion AROM;Right;5 reps    Diagonals Right;AROM;5 reps;Strengthening;10 reps    Theraband Level (Shoulder Diagonals) Level 1 (Yellow)      Shoulder Exercises: Seated   External Rotation Strengthening;Both;10 reps;Theraband    Theraband Level (Shoulder External Rotation) Level 1 (Yellow)    Flexion Right;10 reps;Weights   to 90 deg   Flexion Weight (lbs) 1      Shoulder Exercises: Standing   Extension Strengthening;Both;10 reps    Theraband Level (Shoulder Extension) Level 2 (Red)    Row Strengthening;Both;10 reps;Theraband    Theraband Level (Shoulder Row) Level 2 (  Red)      Shoulder Exercises: Pulleys   Flexion 2 minutes    Scaption 2 minutes      Shoulder Exercises: ROM/Strengthening   Wall Pushups 10 reps   elbows in     Shoulder Exercises: Stretch   Internal Rotation Stretch 2 reps   15 sec, LUE assisting RUE behind back   Star [place]azer Stretch 3 reps;20 seconds   back of palm to forehead   Other Shoulder Stretches Rt bicep brachii stretch holding elevated mat table and stepping forward with RLE x 20 sec x 2rep    Other Shoulder Stretches prolonged stretch with pt in supine, Rt arm abdct 90 deg for pec stretch x 30 sec x 2 reps       Manual Therapy   Soft tissue mobilization STM to Rt tricep, pec, lateral deltoid, levator, rhomboid     Myofascial Release to anterior deltoid/biceps     Scapular  Mobilization Rt lateral glide     Passive ROM Rt shoulder into scaption and ER, to tissue limits and no pain.                   PT Education - 12/04/19 1621    Education Details HEP - updated.  Given yellow and red band.    Person(s) Educated Patient    Methods Explanation;Demonstration;Verbal cues;Handout    Comprehension Verbalized understanding;Returned demonstration            PT Short Term Goals - 12/04/19 1617      PT SHORT TERM GOAL #1   Title Patient independent in initial HEP    Time 6    Period Weeks    Status Achieved    Target Date 12/06/19      PT SHORT TERM GOAL #2   Title Increase PROM Rt shoulder as protocol allows    Time 6    Period Weeks    Status Partially Met    Target Date 12/06/19      PT SHORT TERM GOAL #3   Title Improve posture and alignment with patient demonstrating improved upright posture with posterior shoulder girdle engaged    Time 6    Period Weeks    Status Achieved    Target Date 12/06/19             PT Long Term Goals - 12/04/19 1617      PT LONG TERM GOAL #1   Title AROM Rt shoulder to WFL's and pain free    Time 12    Period Weeks    Status Partially Met      PT LONG TERM GOAL #2   Title 4+/5 to 5/5 strength Rt shoulder    Time 12    Period Weeks    Status On-going      PT LONG TERM GOAL #3   Title patient reports that she has returned to normal functional activities    Time 12    Period Weeks    Status Partially Met      PT LONG TERM GOAL #4   Title Independent in HEP    Time 12    Period Weeks    Status On-going      PT LONG TERM GOAL #5   Title Improve FOTO to </= 44% limitation    Time 12    Period Weeks    Status On-going                 Plan -  12/04/19 1618    Clinical Impression Statement Pt will be 8 wks s/p RTC repair. Her Rt shoulder ROM gradually improving; continued limitation in ER, IR, and ext.  Pt had some increased discomfort with D2 flexion with light resistance as well  as IR rotation stretch.  Pt has partially met her goals and is making good gains towards remaining ones.    Rehab Potential Good    PT Frequency 2x / week    PT Duration 6 weeks    PT Treatment/Interventions Patient/family education;ADLs/Self Care Home Management;Aquatic Therapy;Cryotherapy;Electrical Stimulation;Iontophoresis 38m/ml Dexamethasone;Moist Heat;Ultrasound;Neuromuscular re-education;Therapeutic exercise;Therapeutic activities;Manual techniques;Dry needling;Passive range of motion;Taping    PT Next Visit Plan progress with shoulder rehab per protocol; does not like TENS and vaso using ice at home as needed for pain management.    PT Home Exercise Plan [email or web address] HEP    Consulted and Agree with Plan of Care Patient           Patient will benefit from skilled therapeutic intervention in order to improve the following deficits and impairments:  Decreased range of motion, Increased muscle spasms, Impaired UE functional use, Decreased activity tolerance, Pain, Impaired flexibility, Improper body mechanics, Decreased mobility, Decreased strength, Postural dysfunction  Visit Diagnosis: Acute pain of right shoulder  Abnormal posture  Muscle weakness (generalized)  Other symptoms and signs involving the musculoskeletal system     Problem List Patient Active Problem List   Diagnosis Date Noted  . 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 Hepatic steatosis 11/13/2018  . Tobacco use 07/18/2018  . Sun-induced skin changes, keratosis 08/08/2017  . Subcutaneous cyst 08/08/2017  . Irritable bowel syndrome (IBS) 05/04/2017  . Generalized anxiety disorder 03/16/2016  . Social anxiety disorder 03/16/2016  . Major depressive disorder, recurrent episode, moderate ([place] 03/16/2016  . Right inguinal hernia 07/14/2015  . Hallux valgus 12/09/2014  . Acute headache 07/31/2014  . Chest pain 07/31/2014  . Hypotension 07/31/2014  . Other and unspecified ovarian cyst  06/25/2013  . Left tennis elbow 06/25/2013  . Left breast mass 06/27/2012  . Microscopic hematuria 06/27/2012  . Hyperlipidemia 06/14/2012  . Prediabetes 06/14/2012  . History of leukocytosis 06/14/2012  . Seasonal allergies 06/14/2012  . Depression 06/14/2012  . ADD (attention deficit disorder) 06/14/2012   JKerin Perna PTA 12/04/19 4:24 PM  [place] [address]Phone: [phone]  Fax:  [phone] Name: CKatalyn MatinMRN: [id number] of Birth: [birth year]

## 2019-12-04 NOTE — Patient Instructions (Signed)
Access Code: [email or web address]: [email or web address]: 09/22/2021Prepared by: [facility]villeExercises  Circular Shoulder Pendulum with Table Support - 2 x daily - 7 x weekly - 1 sets - 3 reps - 30 sec hold  Standing Shoulder and Trunk Flexion at Table - 2 x daily - 7 x weekly - 1 sets - 5 reps - 10-15 sec hold  Standing Shoulder Internal Rotation Stretch with Hands Behind Back - 1 x daily - 7 x weekly - 1 sets - 2 reps - 15-30 hold  Supine Chest Stretch with Elbows Bent - 1 x daily - 7 x weekly - 1 sets - 2 reps - 15-30 hold  Standing Shoulder Extension with Dowel - 1 x daily - 7 x weekly - 1 sets - 2-3 reps - 15-30 hold  Supine Bilateral Shoulder External Rotation with Resistance - 1 x daily - 7 x weekly - 3 sets - 10 reps  Seated Single Arm Shoulder Flexion with Dumbbells - 1 x daily - 3 x weekly - 1 sets - 10 reps  Tricep Push Up on Wall - 1 x daily - 3 x weekly - 1 sets - 10 reps  Standing Bilateral Low Shoulder Row with Anchored Resistance - 2 x daily - 3 x weekly - 1-3 sets - 10 reps - 2-3 sec hold  Shoulder Extension with Resistance - 2 x daily - 3 x weekly - 1-3 sets - 10 reps - 2-3 sec hold

## 2019-12-06 ENCOUNTER — Encounter: Payer: Self-pay | Admitting: Rehabilitative and Restorative Service Providers"

## 2019-12-06 ENCOUNTER — Other Ambulatory Visit: Payer: Self-pay

## 2019-12-06 ENCOUNTER — Ambulatory Visit (INDEPENDENT_AMBULATORY_CARE_PROVIDER_SITE_OTHER): Payer: 59 | Admitting: Rehabilitative and Restorative Service Providers"

## 2019-12-06 DIAGNOSIS — R29898 Other symptoms and signs involving the musculoskeletal system: Secondary | ICD-10-CM

## 2019-12-06 DIAGNOSIS — M25511 Pain in right shoulder: Secondary | ICD-10-CM | POA: Diagnosis not present

## 2019-12-06 DIAGNOSIS — M6281 Muscle weakness (generalized): Secondary | ICD-10-CM

## 2019-12-06 DIAGNOSIS — R293 Abnormal posture: Secondary | ICD-10-CM

## 2019-12-06 NOTE — Therapy (Signed)
[place], [place], [address] Phone: [phone]   Fax:  [phone]  Physical Therapy Treatment  Patient Details  Name: Sarah Nicholson MRN: [id number] Date of Birth: March 31, 1970 Referring Provider (PT): Dr Ophelia Charter    Encounter Date: 12/06/2019   PT End of Session - 12/06/19 1449    Visit Number 13    Number of Visits 24    Date for PT Re-Evaluation 01/10/20    PT Start Time 1444    PT Stop Time 1529    PT Time Calculation (min) 45 min    Activity Tolerance Patient tolerated treatment well           Past Medical History:  Diagnosis Date  . ADD (attention deficit disorder) 06/14/2012  . Anxiety   . Bronchitis   . Complication of anesthesia 1987   states woke up during appedentomy  . Depression 06/14/2012  . Hepatic steatosis 11/13/2018   Hepatic steatosis with hepatomegaly seen on MRI abd on 11/2018  . History of leukocytosis 06/14/2012   Reports "WBC 32" decades ago with negative hematology specialist workup.   [name] Hyperlipidemia 06/14/2012  . Seasonal allergies 06/14/2012    Past Surgical History:  Procedure Laterality Date  . APPENDECTOMY    . BICEPT TENODESIS Right 10/10/2019   Procedure: BICEPS TENODESIS;  Surgeon: Hiram Gash, MD;  Location: [facility];  Service: Orthopedics;  Laterality: Right;  . bladder sling/mesh    . INGUINAL HERNIA REPAIR    . intrauterine ablation    . NASAL SINUS SURGERY    . TUBAL LIGATION      There were no vitals filed for this visit.   Subjective Assessment - 12/06/19 1556    Subjective Appointment with Dr Griffin Basil today. He is pleased with her progress and will see her again in 6 weeks. UE is tight and aching    Currently in Pain? Yes    Pain Score 2     Pain Location Shoulder    Pain Orientation Right;Lateral    Pain Descriptors / Indicators Aching;Sore    Pain Type Surgical pain;Chronic pain                              [facility] Adult PT Treatment/Exercise - 12/06/19 0001      Shoulder Exercises: Sidelying   Other Sidelying Exercises AAROM into shoudler abduction 20-30 sec hold x 3 reps       Shoulder Exercises: Standing   External Rotation Strengthening;Right;10 reps;Theraband    Theraband Level (Shoulder External Rotation) Level 1 (Yellow)    External Rotation Limitations repeated - hold with lateral step - 2-3 sec hold x 5 reps     Internal Rotation Strengthening;Right;10 reps;Theraband    Theraband Level (Shoulder Internal Rotation) Level 1 (Yellow)   from ~ 30 deg ER to neutral    Internal Rotation Limitations repeated with hold in neutral with lateral step 2-3 sec hold x 5 reps     Extension Strengthening;Both;10 reps    Theraband Level (Shoulder Extension) Level 2 (Red)    Row Strengthening;Both;10 reps;Theraband    Theraband Level (Shoulder Row) Level 2 (Red)      Shoulder Exercises: Pulleys   Flexion 2 minutes    Scaption 2 minutes      Shoulder Exercises: Therapy Ball   Other Therapy Ball Exercises stabilization/strengthening with ball on wall - flex/ext; horizontal ab/add; circles  CW/CCW x 15-20 reps each       Shoulder Exercises: ROM/Strengthening   Wall Pushups 10 reps   elbows in     Shoulder Exercises: Stretch   Star Gazer Stretch 3 reps;20 seconds;30 seconds   back of palm to forehead   Other Shoulder Stretches Rt bicep brachii stretch holding elevated mat table and stepping forward with RLE x 20 sec x 2rep    Other Shoulder Stretches prolonged stretch with pt in supine, Rt arm abd ~ 90 deg for pec stretch x 60 sec x 2 reps       Manual Therapy   Soft tissue mobilization STM to Rt tricep, pec, lateral deltoid, levator, rhomboid     Myofascial Release to anterior deltoid/biceps     Scapular Mobilization Rt      Passive ROM Rt shoulder flexion; abduction; ER: IR in scapular plane to tissue limits and no pain; abduction in sidelying bringing UE  over head                   PT Education - 12/06/19 1530    Education Details HEP    Person(s) Educated Patient    Methods Explanation;Demonstration;Tactile cues;Verbal cues;Handout    Comprehension Verbalized understanding;Returned demonstration;Verbal cues required;Tactile cues required            PT Short Term Goals - 12/04/19 1617      PT SHORT TERM GOAL #1   Title Patient independent in initial HEP    Time 6    Period Weeks    Status Achieved    Target Date 12/06/19      PT SHORT TERM GOAL #2   Title Increase PROM Rt shoulder as protocol allows    Time 6    Period Weeks    Status Partially Met    Target Date 12/06/19      PT SHORT TERM GOAL #3   Title Improve posture and alignment with patient demonstrating improved upright posture with posterior shoulder girdle engaged    Time 6    Period Weeks    Status Achieved    Target Date 12/06/19             PT Long Term Goals - 12/04/19 1617      PT LONG TERM GOAL #1   Title AROM Rt shoulder to WFL's and pain free    Time 12    Period Weeks    Status Partially Met      PT LONG TERM GOAL #2   Title 4+/5 to 5/5 strength Rt shoulder    Time 12    Period Weeks    Status On-going      PT LONG TERM GOAL #3   Title patient reports that she has returned to normal functional activities    Time 12    Period Weeks    Status Partially Met      PT LONG TERM GOAL #4   Title Independent in HEP    Time 12    Period Weeks    Status On-going      PT LONG TERM GOAL #5   Title Improve FOTO to </= 44% limitation    Time 12    Period Weeks    Status On-going                 Plan - 12/06/19 1525    Clinical Impression Statement Patient added resistive exercises for rotator cuff without difficulty. She continues to have tightness  through the Rt shoulder with PROM. Needs to continue work on strength and ROM; progressing per protocol.    Rehab Potential Good    PT Frequency 2x / week    PT Duration 6  weeks    PT Treatment/Interventions Patient/family education;ADLs/Self Care Home Management;Aquatic Therapy;Cryotherapy;Electrical Stimulation;Iontophoresis 75m/ml Dexamethasone;Moist Heat;Ultrasound;Neuromuscular re-education;Therapeutic exercise;Therapeutic activities;Manual techniques;Dry needling;Passive range of motion;Taping    PT Next Visit Plan progress with shoulder rehab per protocol; does not like TENS and vaso using ice at home as needed for pain management.    PT HCrucible HEP    Consulted and Agree with Plan of Care Patient           Patient will benefit from skilled therapeutic intervention in order to improve the following deficits and impairments:     Visit Diagnosis: Acute pain of right shoulder  Abnormal posture  Muscle weakness (generalized)  Other symptoms and signs involving the musculoskeletal system     Problem List Patient Active Problem List   Diagnosis Date Noted  . 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 Hepatic steatosis 11/13/2018  . Tobacco use 07/18/2018  . Sun-induced skin changes, keratosis 08/08/2017  . Subcutaneous cyst 08/08/2017  . Irritable bowel syndrome (IBS) 05/04/2017  . Generalized anxiety disorder 03/16/2016  . Social anxiety disorder 03/16/2016  . Major depressive disorder, recurrent episode, moderate ([place] 03/16/2016  . Right inguinal hernia 07/14/2015  . Hallux valgus 12/09/2014  . Acute headache 07/31/2014  . Chest pain 07/31/2014  . Hypotension 07/31/2014  . Other and unspecified ovarian cyst 06/25/2013  . Left tennis elbow 06/25/2013  . Left breast mass 06/27/2012  . Microscopic hematuria 06/27/2012  . Hyperlipidemia 06/14/2012  . Prediabetes 06/14/2012  . History of leukocytosis 06/14/2012  . Seasonal allergies 06/14/2012  . Depression 06/14/2012  . ADD (attention deficit disorder) 06/14/2012    Dashley Monts PNilda SimmerPT, MPH  12/06/2019, 3:58 PM  [facility] [address]Phone: [phone]  Fax:  [phone] Name: CQuintasia TherouxMRN: [id number] of Birth: 1May 25, 1972

## 2019-12-06 NOTE — Patient Instructions (Signed)
Access Code: [email or web address]: [email or web address]: 09/24/2021Prepared by: Danyal Whitenack HoltExercises  Circular Shoulder Pendulum with Table Support - 2 x daily - 7 x weekly - 1 sets - 3 reps - 30 sec hold  Standing Shoulder and Trunk Flexion at Table - 2 x daily - 7 x weekly - 1 sets - 5 reps - 10-15 sec hold  Standing Shoulder Internal Rotation Stretch with Hands Behind Back - 1 x daily - 7 x weekly - 1 sets - 2 reps - 15-30 hold  Supine Chest Stretch with Elbows Bent - 1 x daily - 7 x weekly - 1 sets - 2 reps - 15-30 hold  Standing Shoulder Extension with Dowel - 1 x daily - 7 x weekly - 1 sets - 2-3 reps - 15-30 hold  Supine Bilateral Shoulder External Rotation with Resistance - 1 x daily - 7 x weekly - 3 sets - 10 reps  Seated Single Arm Shoulder Flexion with Dumbbells - 1 x daily - 3 x weekly - 1 sets - 10 reps  Tricep Push Up on Wall - 1 x daily - 3 x weekly - 1 sets - 10 reps  Standing Bilateral Low Shoulder Row with Anchored Resistance - 2 x daily - 3 x weekly - 1-3 sets - 10 reps - 2-3 sec hold  Shoulder Extension with Resistance - 2 x daily - 3 x weekly - 1-3 sets - 10 reps - 2-3 sec hold  Shoulder External Rotation Reactive Isometrics - 2 x daily - 7 x weekly - 1 sets - 10 reps - 2-3 sec hold  Seated Shoulder External Rotation with Resistance - 2 x daily - 7 x weekly - 1 sets - 10 reps - 2-3 sec hold  Standing Shoulder Internal Rotation with Anchored Resistance - 2 x daily - 7 x weekly - 1-2 sets - 10 reps - 3 sec hold

## 2019-12-09 ENCOUNTER — Encounter: Payer: 59 | Admitting: Physical Therapy

## 2019-12-12 ENCOUNTER — Other Ambulatory Visit: Payer: Self-pay

## 2019-12-12 ENCOUNTER — Encounter: Payer: Self-pay | Admitting: Physical Therapy

## 2019-12-12 ENCOUNTER — Ambulatory Visit (INDEPENDENT_AMBULATORY_CARE_PROVIDER_SITE_OTHER): Payer: 59 | Admitting: Physical Therapy

## 2019-12-12 DIAGNOSIS — R29898 Other symptoms and signs involving the musculoskeletal system: Secondary | ICD-10-CM | POA: Diagnosis not present

## 2019-12-12 DIAGNOSIS — M6281 Muscle weakness (generalized): Secondary | ICD-10-CM | POA: Diagnosis not present

## 2019-12-12 DIAGNOSIS — R293 Abnormal posture: Secondary | ICD-10-CM

## 2019-12-12 DIAGNOSIS — M25511 Pain in right shoulder: Secondary | ICD-10-CM

## 2019-12-12 NOTE — Therapy (Signed)
[employer], [place], [address] Phone: [phone]   Fax:  [phone]  Physical Therapy Treatment  Patient Details  Name: Sarah Nicholson MRN: [id number] Date of Birth: 01-Nov-1970 Referring Provider (PT): Dr Ophelia Charter    Encounter Date: 12/12/2019   PT End of Session - 12/12/19 1630    Visit Number 14    Number of Visits 24    Date for PT Re-Evaluation 01/10/20    PT Start Time 1618    PT Stop Time 1659    PT Time Calculation (min) 41 min    Activity Tolerance Patient tolerated treatment well    Behavior During Therapy [facility] for tasks assessed/performed           Past Medical History:  Diagnosis Date  . ADD (attention deficit disorder) 06/14/2012  . Anxiety   . Bronchitis   . Complication of anesthesia 1987   states woke up during appedentomy  . Depression 06/14/2012  . Hepatic steatosis 11/13/2018   Hepatic steatosis with hepatomegaly seen on MRI abd on 11/2018  . History of leukocytosis 06/14/2012   Reports "WBC 32" decades ago with negative hematology specialist workup.   [name] Hyperlipidemia 06/14/2012  . Seasonal allergies 06/14/2012    Past Surgical History:  Procedure Laterality Date  . APPENDECTOMY    . BICEPT TENODESIS Right 10/10/2019   Procedure: BICEPS TENODESIS;  Surgeon: Hiram Gash, MD;  Location: [facility];  Service: Orthopedics;  Laterality: Right;  . bladder sling/mesh    . INGUINAL HERNIA REPAIR    . intrauterine ablation    . NASAL SINUS SURGERY    . TUBAL LIGATION      There were no vitals filed for this visit.   Subjective Assessment - 12/12/19 1714    Subjective Pt reports she was very sore after last session (points to lateral ribs and inferior border of scapula).  Pt reports her low back is sore from sitting on floor at work.    Patient Stated Goals move arm; use arm for normal functional and work activities    Currently in Pain? Yes    Pain  Score 1     Pain Location Shoulder    Pain Orientation Right    Pain Descriptors / Indicators Sore    Aggravating Factors  ?              [facility] PT Assessment - 12/12/19 0001      Assessment   Medical Diagnosis Rt biceps tenodesis; SAD; DCR     Referring Provider (PT) Dr Ophelia Charter     Onset Date/Surgical Date 10/10/19    Hand Dominance Right    Prior Therapy yes PT for shd at another facility      AROM   Right Shoulder Extension 34 Degrees    Right Shoulder Flexion 147 Degrees    Right Shoulder Internal Rotation --   thumb to L3           [facility] Adult PT Treatment/Exercise - 12/12/19 0001      Shoulder Exercises: Seated   External Rotation Both;10 reps    Theraband Level (Shoulder External Rotation) Level 1 (Yellow)    Flexion Strengthening;Right;10 reps    Theraband Level (Shoulder Flexion) Level 1 (Yellow)   to shoulder height.    Abduction Strengthening;Right;10 reps    Theraband Level (Shoulder ABduction) Level 1 (Yellow)   to shoulder height     Shoulder Exercises: Standing  Extension AAROM;Both;5 reps   cane for stretch   Diagonals Strengthening;Right;Left;5 reps    Theraband Level (Shoulder Diagonals) Level 1 (Yellow)    Other Standing Exercises reverse wall push up x 10, wall push up x 10      Shoulder Exercises: Stretch   Corner Stretch 2 reps;20 seconds    Corner Stretch Limitations 1 rep at door for demo of HEP    Internal Rotation Stretch 20 seconds   3 reps   Wall Stretch - ABduction 2 reps;20 seconds    Table Stretch - Flexion 1 rep;20 seconds    Other Shoulder Stretches Rt bicep brachii stretch holding elevated mat table and stepping forward with RLE x 20 sec x 3rep    Other Shoulder Stretches trial of Rt pec wall stretch - unable to tolerate;  standing tricep stretch with wall assist x 15 sec.  Double knee to chest x 15 sec; LTR x 10 sec x 2 reps each side.                   PT Education - 12/12/19 1717    Education Details HEP     Person(s) Educated Patient    Methods Explanation;Handout    Comprehension Verbalized understanding            PT Short Term Goals - 12/04/19 1617      PT SHORT TERM GOAL #1   Title Patient independent in initial HEP    Time 6    Period Weeks    Status Achieved    Target Date 12/06/19      PT SHORT TERM GOAL #2   Title Increase PROM Rt shoulder as protocol allows    Time 6    Period Weeks    Status Partially Met    Target Date 12/06/19      PT SHORT TERM GOAL #3   Title Improve posture and alignment with patient demonstrating improved upright posture with posterior shoulder girdle engaged    Time 6    Period Weeks    Status Achieved    Target Date 12/06/19             PT Long Term Goals - 12/04/19 1617      PT LONG TERM GOAL #1   Title AROM Rt shoulder to WFL's and pain free    Time 12    Period Weeks    Status Partially Met      PT LONG TERM GOAL #2   Title 4+/5 to 5/5 strength Rt shoulder    Time 12    Period Weeks    Status On-going      PT LONG TERM GOAL #3   Title patient reports that she has returned to normal functional activities    Time 12    Period Weeks    Status Partially Met      PT LONG TERM GOAL #4   Title Independent in HEP    Time 12    Period Weeks    Status On-going      PT LONG TERM GOAL #5   Title Improve FOTO to </= 44% limitation    Time 12    Period Weeks    Status On-going                 Plan - 12/12/19 1715    Clinical Impression Statement Pt tolerated resisted exercises well, noting that her low back was feeling "a work out too".  Rt  shoulder IR, ER, and ext continue to be limited; flexion ROM has improved.  Progressing well within rehab protocol.    Rehab Potential Good    PT Frequency 2x / week    PT Duration 6 weeks    PT Treatment/Interventions Patient/family education;ADLs/Self Care Home Management;Aquatic Therapy;Cryotherapy;Electrical Stimulation;Iontophoresis 44m/ml Dexamethasone;Moist  Heat;Ultrasound;Neuromuscular re-education;Therapeutic exercise;Therapeutic activities;Manual techniques;Dry needling;Passive range of motion;Taping    PT Next Visit Plan progress with shoulder rehab per protocol; does not like TENS and vaso using ice at home as needed for pain management.    PT HM[place] HEP    Consulted and Agree with Plan of Care Patient           Patient will benefit from skilled therapeutic intervention in order to improve the following deficits and impairments:     Visit Diagnosis: Acute pain of right shoulder  Abnormal posture  Muscle weakness (generalized)  Other symptoms and signs involving the musculoskeletal system     Problem List Patient Active Problem List   Diagnosis Date Noted  . 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 Hepatic steatosis 11/13/2018  . Tobacco use 07/18/2018  . Sun-induced skin changes, keratosis 08/08/2017  . Subcutaneous cyst 08/08/2017  . Irritable bowel syndrome (IBS) 05/04/2017  . Generalized anxiety disorder 03/16/2016  . Social anxiety disorder 03/16/2016  . Major depressive disorder, recurrent episode, moderate ([place] 03/16/2016  . Right inguinal hernia 07/14/2015  . Hallux valgus 12/09/2014  . Acute headache 07/31/2014  . Chest pain 07/31/2014  . Hypotension 07/31/2014  . Other and unspecified ovarian cyst 06/25/2013  . Left tennis elbow 06/25/2013  . Left breast mass 06/27/2012  . Microscopic hematuria 06/27/2012  . Hyperlipidemia 06/14/2012  . Prediabetes 06/14/2012  . History of leukocytosis 06/14/2012  . Seasonal allergies 06/14/2012  . Depression 06/14/2012  . ADD (attention deficit disorder) 06/14/2012   JKerin Perna PTA 12/12/19 5:18 PM   [facility] [place] [address]Phone: [phone]  Fax:  [phone] Name: CAzana KieslerMRN: [id number] of Birth:  106/29/1972

## 2019-12-12 NOTE — Patient Instructions (Signed)
Access Code: [email or web address] URL: [email or web address] Date: 09/30/2021Prepared by: [facility] KernersvilleExercises  Standing Shoulder and Trunk Flexion at Table - 2 x daily - 7 x weekly - 1 sets - 5 reps - 10-15 sec hold  Standing Shoulder Internal Rotation Stretch with Hands Behind Back - 1 x daily - 7 x weekly - 1 sets - 2 reps - 15-30 hold  Standing Shoulder Extension with Dowel - 1 x daily - 7 x weekly - 1 sets - 2-3 reps - 15-30 hold  Doorway Pec Stretch at 90 Degrees Abduction - 2 x daily - 7 x weekly - 1 sets - 2 reps - 20 hold  Latissimus Dorsi Stretch at Wall - 1 x daily - 7 x weekly - 1 sets - 2-3 reps - 15-20 hold  Supine Chest Stretch with Elbows Bent - 1 x daily - 7 x weekly - 1 sets - 2 reps - 15-30 hold  Tricep Push Up on Wall - 1 x daily - 3 x weekly - 1 sets - 10 reps  Standing Bilateral Low Shoulder Row with Anchored Resistance - 1 x daily - 3 x weekly - 1-3 sets - 10 reps - 2-3 sec hold  Shoulder Extension with Resistance - 3 x weekly - 1-3 sets - 10 reps - 2-3 sec hold  Seated Single Arm Shoulder Abduction with Resistance - 1 x daily - 3 x weekly - 1-2 sets - 10 reps  Seated Shoulder Flexion with Resistance - 1 x daily - 3 x weekly - 1-2 sets - 10 reps  Standing Shoulder Diagonal Horizontal Abduction 60/120 Degrees with Resistance - 1 x daily - 3 x weekly - 1-2 sets - 10 reps

## 2019-12-16 ENCOUNTER — Other Ambulatory Visit: Payer: Self-pay

## 2019-12-16 ENCOUNTER — Ambulatory Visit (INDEPENDENT_AMBULATORY_CARE_PROVIDER_SITE_OTHER): Payer: 59 | Admitting: Physical Therapy

## 2019-12-16 ENCOUNTER — Encounter: Payer: Self-pay | Admitting: Physical Therapy

## 2019-12-16 DIAGNOSIS — R29898 Other symptoms and signs involving the musculoskeletal system: Secondary | ICD-10-CM | POA: Diagnosis not present

## 2019-12-16 DIAGNOSIS — M6281 Muscle weakness (generalized): Secondary | ICD-10-CM

## 2019-12-16 DIAGNOSIS — R293 Abnormal posture: Secondary | ICD-10-CM | POA: Diagnosis not present

## 2019-12-16 DIAGNOSIS — M25511 Pain in right shoulder: Secondary | ICD-10-CM

## 2019-12-16 NOTE — Therapy (Signed)
[facility] [place] [facility] [place], [place], [address] Phone: [phone]   Fax:  [phone]  Physical Therapy Treatment  Patient Details  Name: Sarah Nicholson MRN: [id number] Date of Birth: [birth year] Referring Provider (PT): Dr Ophelia Charter    Encounter Date: 12/16/2019   PT End of Session - 12/16/19 1610    Visit Number 15    Number of Visits 24    Date for PT Re-Evaluation 01/10/20    PT Start Time 1608    PT Stop Time 1640    PT Time Calculation (min) 32 min    Activity Tolerance Patient tolerated treatment well    Behavior During Therapy [facility] for tasks assessed/performed           Past Medical History:  Diagnosis Date   ADD (attention deficit disorder) 06/14/2012   Anxiety    Bronchitis    Complication of anesthesia 1987   states woke up during appedentomy   Depression 06/14/2012   Hepatic steatosis 11/13/2018   Hepatic steatosis with hepatomegaly seen on MRI abd on 11/2018   History of leukocytosis 06/14/2012   Reports "WBC 32" decades ago with negative hematology specialist workup.    Hyperlipidemia 06/14/2012   Seasonal allergies 06/14/2012    Past Surgical History:  Procedure Laterality Date   APPENDECTOMY     BICEPT TENODESIS Right 10/10/2019   Procedure: BICEPS TENODESIS;  Surgeon: Hiram Gash, MD;  Location: [facility];  Service: Orthopedics;  Laterality: Right;   bladder sling/mesh     INGUINAL HERNIA REPAIR     intrauterine ablation     NASAL SINUS SURGERY     TUBAL LIGATION      There were no vitals filed for this visit.   Subjective Assessment - 12/16/19 1611    Subjective Pt reports no new changes since last visit.  No longer sore in ribs/ shoulder    Currently in Pain? No/denies    Pain Score 0-No pain    Pain Location Shoulder    Pain Orientation Right              [facility] PT Assessment - 12/16/19 0001      Assessment   Medical Diagnosis Rt biceps  tenodesis; SAD; DCR     Referring Provider (PT) Dr Ophelia Charter     Onset Date/Surgical Date 10/10/19    Hand Dominance Right    Next MD Visit 01/02/20    Prior Therapy yes PT for shd at another facility      AROM   Right Shoulder Extension 43 Degrees           [facility] Adult PT Treatment/Exercise - 12/16/19 0001      Elbow Exercises   Elbow Flexion Strengthening;Right;10 reps;Seated;Bar weights/barbell   4#     Shoulder Exercises: Sidelying   Other Sidelying Exercises Open book Rt thoracic rotation x 3 reps, then 4 reps with red band ; repeated on LUE.      Shoulder Exercises: Standing   Row Strengthening;Both;10 reps;Theraband    Theraband Level (Shoulder Row) Level 2 (Red)   3 sec hold in retraction    Diagonals Strengthening;Right;Left;10 reps    Theraband Level (Shoulder Diagonals) Level 1 (Yellow)    Other Standing Exercises reverse wall push up x 10    Other Standing Exercises Rings on hoop (to flexion of 115 deg at top of hoop) x 12 Rt/Lt.  Rt shoulder flexion with 1# wt to overhead shelf  with eccentric lowering x 10, with 2# wt to shoulder height shelf x 5 reps; Rt shoulder scaption with 1# x 5 reps to shoulder height shelf.       Shoulder Exercises: ROM/Strengthening   UBE (Upper Arm Bike) L1: 1 min forward, 1 min backward, standing       Shoulder Exercises: Stretch   Corner Stretch 1 rep;20 seconds    Internal Rotation Stretch 20 seconds   3 reps   Star Gazer Stretch 1 rep;30 seconds   combined with LTR (rocking knees back 4-5 x)   Other Shoulder Stretches Rt bicep brachii stretch holding elevated mat table and stepping forward with RLE x 20 sec x 3rep    Other Shoulder Stretches Rt tricep stretch x 20 sec x 2 reps;  Rt pec stretch at wall x 15 sec                    PT Short Term Goals - 12/04/19 1617      PT SHORT TERM GOAL #1   Title Patient independent in initial HEP    Time 6    Period Weeks    Status Achieved    Target Date 12/06/19      PT SHORT  TERM GOAL #2   Title Increase PROM Rt shoulder as protocol allows    Time 6    Period Weeks    Status Partially Met    Target Date 12/06/19      PT SHORT TERM GOAL #3   Title Improve posture and alignment with patient demonstrating improved upright posture with posterior shoulder girdle engaged    Time 6    Period Weeks    Status Achieved    Target Date 12/06/19             PT Long Term Goals - 12/04/19 1617      PT LONG TERM GOAL #1   Title AROM Rt shoulder to WFL's and pain free    Time 12    Period Weeks    Status Partially Met      PT LONG TERM GOAL #2   Title 4+/5 to 5/5 strength Rt shoulder    Time 12    Period Weeks    Status On-going      PT LONG TERM GOAL #3   Title patient reports that she has returned to normal functional activities    Time 12    Period Weeks    Status Partially Met      PT LONG TERM GOAL #4   Title Independent in HEP    Time 12    Period Weeks    Status On-going      PT LONG TERM GOAL #5   Title Improve FOTO to </= 44% limitation    Time 12    Period Weeks    Status On-going                 Plan - 12/16/19 1645    Clinical Impression Statement Pt's Rt shoulder ext ROM improved from last session.  Pt is able to move RUE into IR behind back with greater ease, however it is her biggest concern at this point.  Pt tolerated light eccentric resistance strengthening exercises well, without increase in pain.  Progressing well within rehab protocol.  Returns to MD in 2.5 wks.    Rehab Potential Good    PT Frequency 2x / week    PT Duration 6 weeks  PT Treatment/Interventions Patient/family education;ADLs/Self Care Home Management;Aquatic Therapy;Cryotherapy;Electrical Stimulation;Iontophoresis 19m/ml Dexamethasone;Moist Heat;Ultrasound;Neuromuscular re-education;Therapeutic exercise;Therapeutic activities;Manual techniques;Dry needling;Passive range of motion;Taping    PT Next Visit Plan progress with shoulder rehab per protocol;  does not like TENS and vaso using ice at home as needed for pain management.    PT Home Exercise Plan [email or web address] HEP    Consulted and Agree with Plan of Care Patient           Patient will benefit from skilled therapeutic intervention in order to improve the following deficits and impairments:  Decreased range of motion, Increased muscle spasms, Impaired UE functional use, Decreased activity tolerance, Pain, Impaired flexibility, Improper body mechanics, Decreased mobility, Decreased strength, Postural dysfunction  Visit Diagnosis: Acute pain of right shoulder  Abnormal posture  Muscle weakness (generalized)  Other symptoms and signs involving the musculoskeletal system     Problem List Patient Active Problem List   Diagnosis Date Noted   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   Hepatic steatosis 11/13/2018   Tobacco use 07/18/2018   Sun-induced skin changes, keratosis 08/08/2017   Subcutaneous cyst 08/08/2017   Irritable bowel syndrome (IBS) 05/04/2017   Generalized anxiety disorder 03/16/2016   Social anxiety disorder 03/16/2016   Major depressive disorder, recurrent episode, moderate ([place] 03/16/2016   Right inguinal hernia 07/14/2015   Hallux valgus 12/09/2014   Acute headache 07/31/2014   Chest pain 07/31/2014   Hypotension 07/31/2014   Other and unspecified ovarian cyst 06/25/2013   Left tennis elbow 06/25/2013   Left breast mass 06/27/2012   Microscopic hematuria 06/27/2012   Hyperlipidemia 06/14/2012   Prediabetes 06/14/2012   History of leukocytosis 06/14/2012   Seasonal allergies 06/14/2012   Depression 06/14/2012   ADD (attention deficit disorder) 06/14/2012   JKerin Perna PTA 12/16/19 4:49 PM  [facility] [place] [address] [address]Phone: [phone]  Fax:  [phone] Name: Sarah FrysingerMRN: [id number] of  Birth: [birth year]

## 2019-12-18 ENCOUNTER — Emergency Department (INDEPENDENT_AMBULATORY_CARE_PROVIDER_SITE_OTHER)
Admission: EM | Admit: 2019-12-18 | Discharge: 2019-12-18 | Disposition: A | Discharge status: Home / Self Care | Payer: 59 | Source: Home / Self Care

## 2019-12-18 ENCOUNTER — Other Ambulatory Visit: Payer: Self-pay

## 2019-12-18 DIAGNOSIS — M5417 Radiculopathy, lumbosacral region: Secondary | ICD-10-CM

## 2019-12-18 MED ORDER — TIZANIDINE HCL 4 MG PO TABS
4.0000 mg | ORAL_TABLET | Freq: Four times a day (QID) | ORAL | 0 refills | Status: DC | PRN
Start: 2019-12-18 — End: 2020-12-04

## 2019-12-18 MED ORDER — KETOROLAC TROMETHAMINE 60 MG/2ML IM SOLN
60.0000 mg | Freq: Once | INTRAMUSCULAR | Status: AC
  Administered 2019-12-18: 60 mg via INTRAMUSCULAR

## 2019-12-18 MED ORDER — DEXAMETHASONE SODIUM PHOSPHATE 10 MG/ML IJ SOLN
10.0000 mg | Freq: Once | INTRAMUSCULAR | Status: AC
  Administered 2019-12-18: 10 mg via INTRAMUSCULAR

## 2019-12-18 MED ORDER — PREDNISONE 20 MG PO TABS
20.0000 mg | ORAL_TABLET | Freq: Every day | ORAL | 0 refills | Status: DC
Start: 2019-12-18 — End: 2020-02-28

## 2019-12-18 NOTE — ED Triage Notes (Signed)
Pt c/o back spasms and pain x 2 weeks. Currently in physical therapy for shoulder since end of aug. Was given core strengthening excercise which arent helping. Gotten worse in last couple days. Pain is constant dull ache, then sharp shooting at times. Heat/ice and ibuprofen prn. Pain 4/10

## 2019-12-18 NOTE — Discharge Instructions (Signed)
Follow-up with PCP, you may require a back specialist evaluation if symptoms persists. Keep follow-up with PCP. Discontinue physical therapy tomorrow give the degree of your back pain today,

## 2019-12-18 NOTE — ED Provider Notes (Signed)
[name]    CSN: [id number] Arrival date & time: 12/18/19  1242      History   Chief Complaint Chief Complaint  Patient presents with  . Back Pain    HPI Sarah Nicholson is a 49 y.o. female.   HPI  Patient presents for evaluation of 2-week history of low lumbar back spasm and pain. She reports she is currently in physical therapy as she is status post shoulder surgery. She was given some recent core exercises thought to help strengthen her back symptoms have not improved at all. She reports pain with dorsiflexion of her feet and with ambulation. If she does not move the pain subsides. She denies any history of recurrent back pain. She is taking ibuprofen without relief of symptoms. She did have leftover methocarbamol and reports that it did not help with pain. She denies any cauda equina symptoms.  Past Medical History:  Diagnosis Date  . ADD (attention deficit disorder) 06/14/2012  . Anxiety   . Bronchitis   . Complication of anesthesia 1987   states woke up during appedentomy  . Depression 06/14/2012  . Hepatic steatosis 11/13/2018   Hepatic steatosis with hepatomegaly seen on MRI abd on 11/2018  . History of leukocytosis 06/14/2012   Reports "WBC 32" decades ago with negative hematology specialist workup.   [name] Hyperlipidemia 06/14/2012  . Seasonal allergies 06/14/2012    Patient Active Problem List   Diagnosis Date Noted  . 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 Hepatic steatosis 11/13/2018  . Tobacco use 07/18/2018  . Sun-induced skin changes, keratosis 08/08/2017  . Subcutaneous cyst 08/08/2017  . Irritable bowel syndrome (IBS) 05/04/2017  . Generalized anxiety disorder 03/16/2016  . Social anxiety disorder 03/16/2016  . Major depressive disorder, recurrent episode, moderate ([place]) 03/16/2016  . Right inguinal hernia 07/14/2015  . Hallux valgus 12/09/2014  . Acute headache 07/31/2014  . Chest pain 07/31/2014  . Hypotension  07/31/2014  . Other and unspecified ovarian cyst 06/25/2013  . Left tennis elbow 06/25/2013  . Left breast mass 06/27/2012  . Microscopic hematuria 06/27/2012  . Hyperlipidemia 06/14/2012  . Prediabetes 06/14/2012  . History of leukocytosis 06/14/2012  . Seasonal allergies 06/14/2012  . Depression 06/14/2012  . ADD (attention deficit disorder) 06/14/2012    Past Surgical History:  Procedure Laterality Date  . APPENDECTOMY    . BICEPT TENODESIS Right 10/10/2019   Procedure: BICEPS TENODESIS;  Surgeon: Hiram Gash, MD;  Location: [facility];  Service: Orthopedics;  Laterality: Right;  . bladder sling/mesh    . INGUINAL HERNIA REPAIR    . intrauterine ablation    . NASAL SINUS SURGERY    . TUBAL LIGATION      OB History   No obstetric history on file.      Home Medications    Prior to Admission medications   Medication Sig Start Date End Date Taking? Authorizing Provider  amphetamine-dextroamphetamine (ADDERALL) 10 MG tablet Take 1 tablet (10 mg total) by mouth 2 (two) times daily. 02/04/19 10/02/19  Emeterio Reeve, DO  clonazePAM (KLONOPIN) 0.5 MG tablet TAKE 1 TABLET(0.5 MG) BY MOUTH THREE TIMES DAILY AS NEEDED FOR ANXIETY 10/26/19   Emeterio Reeve, DO  EPINEPHRINE 0.3 mg/0.3 mL IJ SOAJ injection INJECT INTRAMUSCULARLY AS DIRECTED 08/20/18   Emeterio Reeve, DO  escitalopram (LEXAPRO) 20 MG tablet Take 1 tablet (20 mg total) by mouth daily. 07/29/19   Emeterio Reeve, DO    Family History Family History  Problem Relation Age of Onset  . Depression Mother   . Diabetes Father   . Hyperlipidemia Father   . Hypertension Father   . Depression Sister   . Depression Son   . Breast cancer Maternal Grandmother     Social History Social History   Tobacco Use  . Smoking status: Current Every Day Smoker    Packs/day: 0.50    Types: Cigarettes  . Smokeless tobacco: Never Used  . Tobacco comment: 10-20  Vaping Use  . Vaping Use: Never used    Substance Use Topics  . Alcohol use: Yes    Comment: 1-2 drinks every 6 months  . Drug use: No     Allergies   Nitrofurantoin, Other, Tetanus toxoids, Macrobid [nitrofurantoin macrocrystal], and Thimerosal   Review of Systems Review of Systems Pertinent negatives listed in HPI Physical Exam Triage Vital Signs ED Triage Vitals [12/18/19 1250]  Enc Vitals Group     BP (!) 137/94     Pulse Rate 72     Resp 17     Temp 98.4 F (36.9 C)     Temp Source Oral     SpO2 97 %     Weight      Height      Head Circumference      Peak Flow      Pain Score      Pain Loc      Pain Edu?      Excl. in [place]?    No data found.  Updated Vital Signs BP (!) 137/94 (BP Location: Left Arm)   Pulse 72   Temp 98.4 F (36.9 C) (Oral)   Resp 17   SpO2 97%   Visual Acuity Right Eye Distance:   Left Eye Distance:   Bilateral Distance:    Right Eye Near:   Left Eye Near:    Bilateral Near:     Physical Exam Vital signs as noted above. Patient appears to be inmoderate pain, antalgic gait noted.  Lumbosacral spine area reveals no local tenderness or bulge. Painful and reduced LS ROM noted.  Positive straight leg raise.  DTR's, motor strength and sensation normal, including heel and toe gait.   Peripheral pulses are palpable. UC Treatments / Results  Labs (all labs ordered are listed, but only abnormal results are displayed) Labs Reviewed - No data to display  EKG   Radiology Procedures Procedures (including critical care time)  Medications Ordered in UC Medications  ketorolac (TORADOL) injection 60 mg (60 mg Intramuscular Given 12/18/19 1318)  dexamethasone (DECADRON) injection 10 mg (10 mg Intramuscular Given 12/18/19 1318)    Initial Impression / Assessment and Plan / UC Course  I have reviewed the triage vital signs and the nursing notes.  Pertinent labs & imaging results that were available during my care of the patient were reviewed by me and considered in my medical  decision making (see chart for details).     Acute lumbar radiculopathy, patient given Toradol and Decadron today.  Start oral prednisone tomorrow. Start Zanaflex as needed for pain.  Avoid taking with any opiate medication as this can cause profound drowsiness.  If pain does not improve would recommend follow-up with orthopedics.  Patient was scheduled for physical therapy on shoulder advised to discontinue therapy today given back pain.  Red flags discussed.  Patient verbalized understanding and agreement with plan.  Advised patient to keep follow-up with primary care provider. Final Clinical Impressions(s) / UC Diagnoses   Final  diagnoses:  Lumbosacral radiculopathy     Discharge Instructions     Follow-up with PCP, you may require a back specialist evaluation if symptoms persists. Keep follow-up with PCP. Discontinue physical therapy tomorrow give the degree of your back pain today,    ED Prescriptions    Medication Sig Dispense Auth. Provider   predniSONE (DELTASONE) 20 MG tablet Take 1 tablet (20 mg total) by mouth daily with breakfast. Prednisone 20 mg, in mornings with breakfast as follows: Take 3 pills for 3 days, take 2 pills for 3 days, and take 1 pill for 3 days. 18 tablet Scot Jun, FNP   tiZANidine (ZANAFLEX) 4 MG tablet Take 1 tablet (4 mg total) by mouth every 6 (six) hours as needed for muscle spasms. 60 tablet Scot Jun, FNP     PDMP not reviewed this encounter.   Scot Jun, FNP 12/20/19 1956

## 2019-12-19 ENCOUNTER — Encounter: Payer: Self-pay | Admitting: Osteopathic Medicine

## 2019-12-19 ENCOUNTER — Ambulatory Visit (INDEPENDENT_AMBULATORY_CARE_PROVIDER_SITE_OTHER): Payer: 59 | Admitting: Osteopathic Medicine

## 2019-12-19 ENCOUNTER — Encounter: Payer: 59 | Admitting: Rehabilitative and Restorative Service Providers"

## 2019-12-19 VITALS — BP 134/85 | HR 91 | Temp 98.0°F | Wt 204.0 lb

## 2019-12-19 DIAGNOSIS — M5417 Radiculopathy, lumbosacral region: Secondary | ICD-10-CM

## 2019-12-19 DIAGNOSIS — F32A Depression, unspecified: Secondary | ICD-10-CM

## 2019-12-19 DIAGNOSIS — M545 Low back pain, unspecified: Secondary | ICD-10-CM | POA: Diagnosis not present

## 2019-12-19 DIAGNOSIS — F411 Generalized anxiety disorder: Secondary | ICD-10-CM | POA: Diagnosis not present

## 2019-12-19 MED ORDER — ESCITALOPRAM OXALATE 20 MG PO TABS: 40.0000 mg | ORAL_TABLET | Freq: Every day | ORAL | 3 refills | Status: DC

## 2019-12-19 NOTE — Progress Notes (Signed)
Sarah Nicholson is a 49 y.o. female who presents to  [facility]  today, 12/19/19, seeking care for the following:  . Lower back pain . Mental health - has been taking Lexapro 40 mg, would like refill      ASSESSMENT & PLAN with other pertinent findings:  The primary encounter diagnosis was Lumbosacral radiculopathy. Diagnoses of Depression, unspecified depression type, Generalized anxiety disorder, and Acute bilateral low back pain without sciatica were also pertinent to this visit.   Ongoing x2 weeks  Went to urgent care yesterday Treated w/ Toradol 60 mg IM, dexamethasone 10 mg IM D/c home on prednisone taper, tizanidine Doing better today  Also taking Ibuprofen  Has leftover opiate from surgery  --> continue current meds, ok to take opiates sparingly  --> will refer for physical therapy - pause on shoulder   Mental health  --> refill lexapro, ok to continue the lexapro    There are no Patient Instructions on file for this visit.  Orders Placed This Encounter  Procedures  . Ambulatory referral to Physical Therapy    Meds ordered this encounter  Medications  . escitalopram (LEXAPRO) 20 MG tablet    Sig: Take 2 tablets (40 mg total) by mouth daily.    Dispense:  180 tablet    Refill:  3       Follow-up instructions: Return if symptoms worsen or fail to improve.                                         BP 134/85 (BP Location: Left Arm, Patient Position: Sitting)   Pulse 91   Temp 98 F (36.7 C)   Wt 204 lb (92.5 kg)   SpO2 98%   BMI 32.93 kg/m   No outpatient medications have been marked as taking for the 12/19/19 encounter (Office Visit) with Emeterio Reeve, DO.    No results found for this or any previous visit (from the past 72 hour(s)).  No results found.     All questions at time of visit were answered - patient instructed to contact office  with any additional concerns or updates.  ER/RTC precautions were reviewed with the patient as applicable.   Please note: voice recognition software was used to produce this document, and typos may escape review. Please contact Dr. Sheppard Coil for any needed clarifications.

## 2019-12-23 ENCOUNTER — Ambulatory Visit (INDEPENDENT_AMBULATORY_CARE_PROVIDER_SITE_OTHER): Payer: 59 | Admitting: Rehabilitative and Restorative Service Providers"

## 2019-12-23 ENCOUNTER — Other Ambulatory Visit: Payer: Self-pay

## 2019-12-23 DIAGNOSIS — R29898 Other symptoms and signs involving the musculoskeletal system: Secondary | ICD-10-CM

## 2019-12-23 DIAGNOSIS — M546 Pain in thoracic spine: Secondary | ICD-10-CM | POA: Diagnosis not present

## 2019-12-23 DIAGNOSIS — M545 Low back pain, unspecified: Secondary | ICD-10-CM

## 2019-12-23 NOTE — Patient Instructions (Addendum)
Trigger Point Dry Needling  . What is Trigger Point Dry Needling (DN)? o DN is a physical therapy technique used to treat muscle pain and dysfunction. Specifically, DN helps deactivate muscle trigger points (muscle knots).  o A thin filiform needle is used to penetrate the skin and stimulate the underlying trigger point. The goal is for a local twitch response (LTR) to occur and for the trigger point to relax. No medication of any kind is injected during the procedure.   . What Does Trigger Point Dry Needling Feel Like?  o The procedure feels different for each individual patient. Some patients report that they do not actually feel the needle enter the skin and overall the process is not painful. Very mild bleeding may occur. However, many patients feel a deep cramping in the muscle in which the needle was inserted. This is the local twitch response.   [name] How Will I feel after the treatment? o Soreness is normal, and the onset of soreness may not occur for a few hours. Typically this soreness does not last longer than two days.  o Bruising is uncommon, however; ice can be used to decrease any possible bruising.  o In rare cases feeling tired or nauseous after the treatment is normal. In addition, your symptoms may get worse before they get better, this period will typically not last longer than 24 hours.   . What Can I do After My Treatment? o Increase your hydration by drinking more water for the next 24 hours. o You may place ice or heat on the areas treated that have become sore, however, do not use heat on inflamed or bruised areas. Heat often brings more relief post needling. o You can continue your regular activities, but vigorous activity is not recommended initially after the treatment for 24 hours. o DN is best combined with other physical therapy such as strengthening, stretching, and other therapies.    Access Code: [email or web address]: [email or web address]:  10/11/2021Prepared by: Mecca Guitron HoltExercises  Standing Shoulder and Trunk Flexion at Table - 2 x daily - 7 x weekly - 1 sets - 5 reps - 10-15 sec hold  Standing Shoulder Internal Rotation Stretch with Hands Behind Back - 1 x daily - 7 x weekly - 1 sets - 2 reps - 15-30 hold  Standing Shoulder Extension with Dowel - 1 x daily - 7 x weekly - 1 sets - 2-3 reps - 15-30 hold  Doorway Pec Stretch at 90 Degrees Abduction - 2 x daily - 7 x weekly - 1 sets - 2 reps - 20 hold  Latissimus Dorsi Stretch at Wall - 1 x daily - 7 x weekly - 1 sets - 2-3 reps - 15-20 hold  Supine Chest Stretch with Elbows Bent - 1 x daily - 7 x weekly - 1 sets - 2 reps - 15-30 hold  Tricep Push Up on Wall - 1 x daily - 3 x weekly - 1 sets - 10 reps  Standing Bilateral Low Shoulder Row with Anchored Resistance - 1 x daily - 3 x weekly - 1-3 sets - 10 reps - 2-3 sec hold  Shoulder Extension with Resistance - 3 x weekly - 1-3 sets - 10 reps - 2-3 sec hold  Seated Single Arm Shoulder Abduction with Resistance - 1 x daily - 3 x weekly - 1-2 sets - 10 reps  Seated Shoulder Flexion with Resistance - 1 x daily - 3 x weekly - 1-2 sets - 10 reps  Standing Shoulder Diagonal  Horizontal Abduction 60/120 Degrees with Resistance - 1 x daily - 3 x weekly - 1-2 sets - 10 reps  Supine Double Knee to Chest - 2 x daily - 7 x weekly - 1 sets - 3 reps - 30-45 sec hold  Cat-Camel - 2 x daily - 7 x weekly - 1 sets - 5-10 reps - 3-5 sec hold  Cat-Camel to Child's Pose - 2 x daily - 7 x weekly - 1 sets - 3 reps - 30 sec hold  Seated Flexion Stretch - 2 x daily - 7 x weekly - 1 sets - 3 reps - 30 sec hold  Seated Sidebending - 2 x daily - 7 x weekly - 1 sets - 3 reps - 30 sec hold

## 2019-12-23 NOTE — Therapy (Signed)
[facility] [place], [place], [address] Phone: [phone]   Fax:  [phone]  Physical Therapy Treatment  Patient Details  Name: Thaila Bottoms MRN: [id number] Date of Birth: 1970-06-02 Referring Provider (PT): Dr Ophelia Charter; Dr Emeterio Reeve    Encounter Date: 12/23/2019   PT End of Session - 12/23/19 1626    Visit Number 16    Number of Visits 28    Date for PT Re-Evaluation 02/03/20    PT Start Time 1561    PT Stop Time 1600    PT Time Calculation (min) 44 min           Past Medical History:  Diagnosis Date  . ADD (attention deficit disorder) 06/14/2012  . Anxiety   . Bronchitis   . Complication of anesthesia 1987   states woke up during appedentomy  . Depression 06/14/2012  . Hepatic steatosis 11/13/2018   Hepatic steatosis with hepatomegaly seen on MRI abd on 11/2018  . History of leukocytosis 06/14/2012   Reports "WBC 32" decades ago with negative hematology specialist workup.   [name] Hyperlipidemia 06/14/2012  . Seasonal allergies 06/14/2012    Past Surgical History:  Procedure Laterality Date  . APPENDECTOMY    . BICEPT TENODESIS Right 10/10/2019   Procedure: BICEPS TENODESIS;  Surgeon: Hiram Gash, MD;  Location: [facility];  Service: Orthopedics;  Laterality: Right;  . bladder sling/mesh    . INGUINAL HERNIA REPAIR    . intrauterine ablation    . NASAL SINUS SURGERY    . TUBAL LIGATION      There were no vitals filed for this visit.   Subjective Assessment - 12/23/19 1523    Subjective Patient reports that she has some pain in the LB ~ 3 weeks ago with some "twinges" on and off since that. She has significant increased pain in the LB 10/5 and 12/18/19 resulting in inability to move of walk. Treated in urgent care with injections with some improvement. Seen by MD the following day and started on medication. Symptoms have improved but persist. She continues to have  muscle spasms in the mid to low back on an intermittent basis daily.    Pertinent History no history of back pain    Currently in Pain? Yes    Pain Score 2     Pain Location Back    Pain Orientation Right;Left;Mid;Lower    Pain Descriptors / Indicators Constant;Aching;Spasm    Pain Type Acute pain              [facility] PT Assessment - 12/23/19 0001      Assessment   Medical Diagnosis Rt biceps tenodesis; SAD; DCR; mid to LBP      Referring Provider (PT) Dr Ophelia Charter; Dr Emeterio Reeve     Onset Date/Surgical Date 10/10/19   12/17/19   Hand Dominance Right    Next MD Visit 01/02/20    Prior Therapy yes PT for shd at another facility      Precautions   Precautions Other (comment)      Sensation   Additional Comments WFL's per pt report       Posture/Postural Control   Posture Comments head forward shoulders rounded and elevated; scapulae abducted and rotated along the thoracic wall       AROM   Lumbar Flexion 80%    Lumbar Extension 50%    Lumbar - Right Side Bend 80%    Lumbar -  Left Side Bend 70% discomfort mid to low     Lumbar - Right Rotation 40%     Lumbar - Left Rotation 40% discomfort Lt mid back       Flexibility   Hamstrings WFL's     Quadriceps WFL's    ITB WFL's     Piriformis WFL's       Palpation   Spinal mobility mild hypomobility mid to lumbar spine     Palpation comment muscular tightness noted through the Lt lats; thoracic paraspinals       Special Tests   Other special tests (-) SLR; slump test                          [facility] Adult PT Treatment/Exercise - 12/23/19 0001      Lumbar Exercises: Stretches   Double Knee to Chest Stretch 3 reps;30 seconds    Double Knee to Chest Stretch Limitations added lat stretch pushing hands over head 30 sec x 3 reps     Quadruped Mid Back Stretch Limitations cat cow 5 sec hold x 5; child's pose 30 sec hold x 3 reps     Other Lumbar Stretch Exercise sitting trunk flexion rolling forward one  vertebrae at a time 30 sec x 3 reps     Other Lumbar Stretch Exercise lateral trunk flexion to the Rt to stretch Lt side 30 sec x 3 reps                   PT Education - 12/23/19 1558    Education Details DN HEP    Person(s) Educated Patient    Methods Explanation;Demonstration;Tactile cues;Verbal cues;Handout    Comprehension Verbalized understanding;Returned demonstration;Verbal cues required;Tactile cues required            PT Short Term Goals - 12/23/19 1612      PT SHORT TERM GOAL #1   Title Patient independent in initial HEP    Time 6    Period Weeks    Status New    Target Date 02/03/20      PT SHORT TERM GOAL #2   Title Decrease pain and muscle spasms 75-100% allowing patient to return to normal functional activities    Time 6    Period Weeks    Status New    Target Date 02/03/20      PT SHORT TERM GOAL #3   Title Improve trunk mobilty and core strength allowing patient to return to all normal functional activities    Time 6    Period Weeks    Status New    Target Date 02/03/20             PT Long Term Goals - 12/04/19 1617      PT LONG TERM GOAL #1   Title AROM Rt shoulder to WFL's and pain free    Time 12    Period Weeks    Status Partially Met      PT LONG TERM GOAL #2   Title 4+/5 to 5/5 strength Rt shoulder    Time 12    Period Weeks    Status On-going      PT LONG TERM GOAL #3   Title patient reports that she has returned to normal functional activities    Time 12    Period Weeks    Status Partially Met      PT LONG TERM GOAL #4   Title Independent  in HEP    Time 12    Period Weeks    Status On-going      PT LONG TERM GOAL #5   Title Improve FOTO to </= 44% limitation    Time 12    Period Weeks    Status On-going                 Plan - 12/23/19 1547    Clinical Impression Statement Patient presents with pain in the mid to low back with sudden onset or sever spasms 12/17/19. Pain has improved with medication but  persists on an intermittent basis. She has limited trunk ROM and mobility; muscular tightness to palpation; pain with functional activities. Patient has increased her functional use of Lt UE following return to work s/p Rt RCR. She has spasm and tightness in the latismus dorsi and thoracolumbar paraspinals. Symptoms are related to work tasks and repetitive motions of Lt UE. She will belfit from PT to address problems identified.    Stability/Clinical Decision Making Stable/Uncomplicated    Rehab Potential Good    PT Frequency 2x / week    PT Duration 6 weeks    PT Treatment/Interventions Patient/family education;ADLs/Self Care Home Management;Aquatic Therapy;Cryotherapy;Electrical Stimulation;Iontophoresis 68m/ml Dexamethasone;Moist Heat;Ultrasound;Neuromuscular re-education;Therapeutic exercise;Therapeutic activities;Manual techniques;Dry needling;Passive range of motion;Taping    PT Next Visit Plan assess response to stretches for mid to low back/lats; progress with shoulder rehab per protocol as back pain allows; does not like TENS and vaso using ice at home as needed for pain management.    PT Home Exercise Plan [id number] HEP    Consulted and Agree with Plan of Care Patient           Patient will benefit from skilled therapeutic intervention in order to improve the following deficits and impairments:  Decreased range of motion, Increased muscle spasms, Impaired UE functional use, Decreased activity tolerance, Pain, Impaired flexibility, Improper body mechanics, Decreased mobility, Decreased strength, Postural dysfunction  Visit Diagnosis: Thoracolumbar back pain - Plan: PT plan of care cert/re-cert  Other symptoms and signs involving the musculoskeletal system - Plan: PT plan of care cert/re-cert     Problem List Patient Active Problem List   Diagnosis Date Noted  . 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 Hepatic steatosis 11/13/2018  . Tobacco use 07/18/2018    . Sun-induced skin changes, keratosis 08/08/2017  . Subcutaneous cyst 08/08/2017  . Irritable bowel syndrome (IBS) 05/04/2017  . Generalized anxiety disorder 03/16/2016  . Social anxiety disorder 03/16/2016  . Major depressive disorder, recurrent episode, moderate ([place] 03/16/2016  . Right inguinal hernia 07/14/2015  . Hallux valgus 12/09/2014  . Acute headache 07/31/2014  . Chest pain 07/31/2014  . Hypotension 07/31/2014  . Other and unspecified ovarian cyst 06/25/2013  . Left tennis elbow 06/25/2013  . Left breast mass 06/27/2012  . Microscopic hematuria 06/27/2012  . Hyperlipidemia 06/14/2012  . Prediabetes 06/14/2012  . History of leukocytosis 06/14/2012  . Seasonal allergies 06/14/2012  . Depression 06/14/2012  . ADD (attention deficit disorder) 06/14/2012    Seleny Allbright PNilda SimmerPT, MPH  12/23/2019, 4:28 PM  [facility] [address]Phone: [phone]  Fax:  [phone] Name: CDarrell LeonhardtMRN: [id number] of Birth: [birth year]

## 2019-12-26 ENCOUNTER — Other Ambulatory Visit: Payer: Self-pay

## 2019-12-26 ENCOUNTER — Ambulatory Visit (INDEPENDENT_AMBULATORY_CARE_PROVIDER_SITE_OTHER): Payer: 59 | Admitting: Physical Therapy

## 2019-12-26 DIAGNOSIS — R29898 Other symptoms and signs involving the musculoskeletal system: Secondary | ICD-10-CM | POA: Diagnosis not present

## 2019-12-26 DIAGNOSIS — M25511 Pain in right shoulder: Secondary | ICD-10-CM | POA: Diagnosis not present

## 2019-12-26 DIAGNOSIS — M545 Low back pain, unspecified: Secondary | ICD-10-CM | POA: Diagnosis not present

## 2019-12-26 DIAGNOSIS — R293 Abnormal posture: Secondary | ICD-10-CM | POA: Diagnosis not present

## 2019-12-26 DIAGNOSIS — M546 Pain in thoracic spine: Secondary | ICD-10-CM

## 2019-12-26 NOTE — Therapy (Signed)
[facility] [place], [place], [address] Phone: [phone]   Fax:  [phone]  Physical Therapy Treatment  Patient Details  Name: Sarah Nicholson MRN: [id number] Date of Birth: 03/24/1970 Referring Provider (PT): Dr Ophelia Charter; Dr Emeterio Reeve    Encounter Date: 12/26/2019   PT End of Session - 12/26/19 1539    Visit Number 17    Number of Visits 28    Date for PT Re-Evaluation 02/03/20    PT Start Time 1536    PT Stop Time 1614    PT Time Calculation (min) 38 min    Activity Tolerance Patient tolerated treatment well    Behavior During Therapy [facility] for tasks assessed/performed           Past Medical History:  Diagnosis Date  . ADD (attention deficit disorder) 06/14/2012  . Anxiety   . Bronchitis   . Complication of anesthesia 1987   states woke up during appedentomy  . Depression 06/14/2012  . Hepatic steatosis 11/13/2018   Hepatic steatosis with hepatomegaly seen on MRI abd on 11/2018  . History of leukocytosis 06/14/2012   Reports "WBC 32" decades ago with negative hematology specialist workup.   [name] Hyperlipidemia 06/14/2012  . Seasonal allergies 06/14/2012    Past Surgical History:  Procedure Laterality Date  . APPENDECTOMY    . BICEPT TENODESIS Right 10/10/2019   Procedure: BICEPS TENODESIS;  Surgeon: Hiram Gash, MD;  Location: [facility];  Service: Orthopedics;  Laterality: Right;  . bladder sling/mesh    . INGUINAL HERNIA REPAIR    . intrauterine ablation    . NASAL SINUS SURGERY    . TUBAL LIGATION      There were no vitals filed for this visit.   Subjective Assessment - 12/26/19 1539    Subjective Pt reports she is still taking muscle relaxers 2x/ day.  She finished last dose of prednisone today.  Pt reports she is performing exercises 1x/day.    Patient Stated Goals move arm; use arm for normal functional and work activities    Currently in Pain? No/denies     Pain Score 0-No pain              [facility] PT Assessment - 12/26/19 0001      Assessment   Medical Diagnosis Rt biceps tenodesis; SAD; DCR; mid to LBP      Referring Provider (PT) Dr Ophelia Charter; Dr Emeterio Reeve     Onset Date/Surgical Date 10/10/19   12/17/19   Hand Dominance Right    Next MD Visit 01/02/20    Prior Therapy yes PT for shd at another facility            [facility] Adult PT Treatment/Exercise - 12/26/19 0001      Lumbar Exercises: Stretches   Passive Hamstring Stretch Left;2 reps;20 seconds   single leg long sitting.    Double Knee to Chest Stretch 2 reps;20 seconds    Quadruped Mid Back Stretch Limitations cat cow 5 sec hold x 5; child's pose x 10 sec, repeated with lateral trunk flexion x 10 sec     Other Lumbar Stretch Exercise sitting trunk flexion rolling forward one vertebrae at a time 30 sec x 2     Other Lumbar Stretch Exercise lateral trunk flexion to the Rt to stretch Lt side x 20 sec, Rt sidelying over bolster x 20 sec x 2       Shoulder Exercises:  ROM/Strengthening   Nustep L4-5: arms/(9)legs x 5.5 min for warm up.       Manual Therapy   Soft tissue mobilization STM to thoracic iliocostalis, lats, QL to decrease fascial adhesions, pain and improve mobility with less pain.     Myofascial Release MFR Lt lat near origin                    PT Short Term Goals - 12/23/19 1612      PT SHORT TERM GOAL #1   Title Patient independent in initial HEP    Time 6    Period Weeks    Status New    Target Date 02/03/20      PT SHORT TERM GOAL #2   Title Decrease pain and muscle spasms 75-100% allowing patient to return to normal functional activities    Time 6    Period Weeks    Status New    Target Date 02/03/20      PT SHORT TERM GOAL #3   Title Improve trunk mobilty and core strength allowing patient to return to all normal functional activities    Time 6    Period Weeks    Status New    Target Date 02/03/20             PT Long Term  Goals - 12/04/19 1617      PT LONG TERM GOAL #1   Title AROM Rt shoulder to WFL's and pain free    Time 12    Period Weeks    Status Partially Met      PT LONG TERM GOAL #2   Title 4+/5 to 5/5 strength Rt shoulder    Time 12    Period Weeks    Status On-going      PT LONG TERM GOAL #3   Title patient reports that she has returned to normal functional activities    Time 12    Period Weeks    Status Partially Met      PT LONG TERM GOAL #4   Title Independent in HEP    Time 12    Period Weeks    Status On-going      PT LONG TERM GOAL #5   Title Improve FOTO to </= 44% limitation    Time 12    Period Weeks    Status On-going                 Plan - 12/26/19 1629    Clinical Impression Statement Pt presents with much less pain than last session.  Continued point tenderness along Lt lower ribs and fascial origin of latissmus dorsi; improved with STM. Goals are ongoing.    Stability/Clinical Decision Making Stable/Uncomplicated    Rehab Potential Good    PT Frequency 2x / week    PT Duration 6 weeks    PT Treatment/Interventions Patient/family education;ADLs/Self Care Home Management;Aquatic Therapy;Cryotherapy;Electrical Stimulation;Iontophoresis 78m/ml Dexamethasone;Moist Heat;Ultrasound;Neuromuscular re-education;Therapeutic exercise;Therapeutic activities;Manual techniques;Dry needling;Passive range of motion;Taping    PT Next Visit Plan Continue LB/lat stretches; progress with shoulder rehab per protocol as back pain allows; does not like TENS and vaso using ice at home as needed for pain management.    PT [place] HEP    Consulted and Agree with Plan of Care Patient           Patient will benefit from skilled therapeutic intervention in order to improve the following deficits and impairments:  Decreased range  of motion, Increased muscle spasms, Impaired UE functional use, Decreased activity tolerance, Pain, Impaired flexibility, Improper body  mechanics, Decreased mobility, Decreased strength, Postural dysfunction  Visit Diagnosis: Thoracolumbar back pain  Other symptoms and signs involving the musculoskeletal system  Acute pain of right shoulder  Abnormal posture     Problem List Patient Active Problem List   Diagnosis Date Noted  . 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 Hepatic steatosis 11/13/2018  . Tobacco use 07/18/2018  . Sun-induced skin changes, keratosis 08/08/2017  . Subcutaneous cyst 08/08/2017  . Irritable bowel syndrome (IBS) 05/04/2017  . Generalized anxiety disorder 03/16/2016  . Social anxiety disorder 03/16/2016  . Major depressive disorder, recurrent episode, moderate ([place]) 03/16/2016  . Right inguinal hernia 07/14/2015  . Hallux valgus 12/09/2014  . Acute headache 07/31/2014  . Chest pain 07/31/2014  . Hypotension 07/31/2014  . Other and unspecified ovarian cyst 06/25/2013  . Left tennis elbow 06/25/2013  . Left breast mass 06/27/2012  . Microscopic hematuria 06/27/2012  . Hyperlipidemia 06/14/2012  . Prediabetes 06/14/2012  . History of leukocytosis 06/14/2012  . Seasonal allergies 06/14/2012  . Depression 06/14/2012  . ADD (attention deficit disorder) 06/14/2012   Kerin Perna, PTA 12/26/19 4:36 PM  [facility] [place], [place], [address] Phone: [phone]   Fax:  [phone]  Name: Sarah Nicholson MRN: [id number] Date of Birth: 05-Nov-1970

## 2019-12-30 ENCOUNTER — Encounter: Payer: 59 | Admitting: Physical Therapy

## 2020-01-02 ENCOUNTER — Other Ambulatory Visit: Payer: Self-pay

## 2020-01-02 ENCOUNTER — Ambulatory Visit (INDEPENDENT_AMBULATORY_CARE_PROVIDER_SITE_OTHER): Payer: 59 | Admitting: Physical Therapy

## 2020-01-02 DIAGNOSIS — R293 Abnormal posture: Secondary | ICD-10-CM | POA: Diagnosis not present

## 2020-01-02 DIAGNOSIS — M6281 Muscle weakness (generalized): Secondary | ICD-10-CM

## 2020-01-02 DIAGNOSIS — R29898 Other symptoms and signs involving the musculoskeletal system: Secondary | ICD-10-CM

## 2020-01-02 DIAGNOSIS — M546 Pain in thoracic spine: Secondary | ICD-10-CM

## 2020-01-02 DIAGNOSIS — M545 Low back pain, unspecified: Secondary | ICD-10-CM | POA: Diagnosis not present

## 2020-01-02 DIAGNOSIS — M25511 Pain in right shoulder: Secondary | ICD-10-CM

## 2020-01-02 NOTE — Therapy (Signed)
[facility] [place] [address] [place] [address] [place], [place], [address] Phone: [phone]   Fax:  [phone]  Physical Therapy Treatment  Patient Details  Name: Sarah Nicholson MRN: [id number] Date of Birth: 05/13/1970 Referring Provider (PT): Dr Ramond Marrow; Dr Sunnie Nielsen    Encounter Date: 01/02/2020   PT End of Session - 01/02/20 1710    Visit Number 18    Number of Visits 28    Date for PT Re-Evaluation 02/03/20    PT Start Time 1702    PT Stop Time 1741    PT Time Calculation (min) 39 min    Activity Tolerance Patient tolerated treatment well    Behavior During Therapy [facility] for tasks assessed/performed           Past Medical History:  Diagnosis Date  . ADD (attention deficit disorder) 06/14/2012  . Anxiety   . Bronchitis   . Complication of anesthesia 1987   states woke up during appedentomy  . Depression 06/14/2012  . Hepatic steatosis 11/13/2018   Hepatic steatosis with hepatomegaly seen on MRI abd on 11/2018  . History of leukocytosis 06/14/2012   Reports "WBC 32" decades ago with negative hematology specialist workup.   [name] Hyperlipidemia 06/14/2012  . Seasonal allergies 06/14/2012    Past Surgical History:  Procedure Laterality Date  . APPENDECTOMY    . BICEPT TENODESIS Right 10/10/2019   Procedure: BICEPS TENODESIS;  Surgeon: Bjorn Pippin, MD;  Location: [facility];  Service: Orthopedics;  Laterality: Right;  . bladder sling/mesh    . INGUINAL HERNIA REPAIR    . intrauterine ablation    . NASAL SINUS SURGERY    . TUBAL LIGATION      There were no vitals filed for this visit.   Subjective Assessment - 01/02/20 1711    Subjective Pt reports back pain is 75% improved.  She said she saw Dr. Everardo Pacific today; was released from his care.  "I can go to higher weights now"; no weight restrictions.  She is interested in strength training (to prepare for lifting at work)    Pertinent History no history of  back pain    Patient Stated Goals move arm; use arm for normal functional and work activities    Currently in Pain? Yes    Pain Score 4     Pain Location Back    Pain Orientation Left;Lower    Pain Descriptors / Indicators Dull;Tightness;Aching    Aggravating Factors  ?    Pain Relieving Factors stretches              [facility] PT Assessment - 01/02/20 0001      Assessment   Medical Diagnosis Rt biceps tenodesis; SAD; DCR; mid to LBP      Referring Provider (PT) Dr Ramond Marrow; Dr Sunnie Nielsen     Onset Date/Surgical Date 10/10/19   12/17/19   Hand Dominance Right    Next MD Visit PRN    Prior Therapy yes PT for shd at another facility      AROM   Right Shoulder Extension 48 Degrees    Right Shoulder Internal Rotation --   thumb to T10   Lumbar Flexion 100%            [facility] Adult PT Treatment/Exercise - 01/02/20 0001      Lumbar Exercises: Stretches   Other Lumbar Stretch Exercise sitting trunk flexion rolling forward one vertebrae at a time 30 sec x 2  Shoulder Exercises: Sidelying   Other Sidelying Exercises Open book R/L x 3 reps no resistance, 5 reps with yellow band       Shoulder Exercises: Standing   Flexion Strengthening;Right;10 reps    Theraband Level (Shoulder Flexion) Level 1 (Yellow)   to 90 deg   ABduction Strengthening;Right;10 reps   to 90 deg   Theraband Level (Shoulder ABduction) Level 1 (Yellow)    Row Both;10 reps    Theraband Level (Shoulder Row) Level 3 (Green)    Diagonals Right;10 reps    Theraband Level (Shoulder Diagonals) Level 1 (Yellow)      Shoulder Exercises: ROM/Strengthening   Nustep L5: arms/(9)legs x 5.5 min for warm up.     Wall Pushups 5 reps    Wall Pushups Limitations and 5 countertop push ups followed by lat/low back stretch       Shoulder Exercises: Stretch   Internal Rotation Stretch 20 seconds    Other Shoulder Stretches 3 position doorway stretch x 15 sec x 2 reps each position, minor cues on form.     Other  Shoulder Stretches childs pose x 15 sec, then lateral flexion x 10 sec each side.                     PT Short Term Goals - 01/02/20 1807      PT SHORT TERM GOAL #1   Title Patient independent in initial HEP    Time 6    Period Weeks    Status Achieved      PT SHORT TERM GOAL #2   Title Decrease pain and muscle spasms 75-100% allowing patient to return to normal functional activities    Time 6    Period Weeks    Status Achieved      PT SHORT TERM GOAL #3   Title Improve trunk mobilty and core strength allowing patient to return to all normal functional activities    Time 6    Period Weeks    Status Partially Met             PT Long Term Goals - 12/04/19 1617      PT LONG TERM GOAL #1   Title AROM Rt shoulder to WFL's and pain free    Time 12    Period Weeks    Status Partially Met      PT LONG TERM GOAL #2   Title 4+/5 to 5/5 strength Rt shoulder    Time 12    Period Weeks    Status On-going      PT LONG TERM GOAL #3   Title patient reports that she has returned to normal functional activities    Time 12    Period Weeks    Status Partially Met      PT LONG TERM GOAL #4   Title Independent in HEP    Time 12    Period Weeks    Status On-going      PT LONG TERM GOAL #5   Title Improve FOTO to </= 44% limitation    Time 12    Period Weeks    Status On-going                 Plan - 01/02/20 1803    Clinical Impression Statement Pt's back pain almost fully resolved.  Reviewed shoulder strengthening exercises and issued green band for progression of row.  Pt tolerated all exercises well without shoulder or low back pain.  Stability/Clinical Decision Making Stable/Uncomplicated    Rehab Potential Good    PT Frequency 2x / week    PT Duration 6 weeks    PT Treatment/Interventions Patient/family education;ADLs/Self Care Home Management;Aquatic Therapy;Cryotherapy;Electrical Stimulation;Iontophoresis 7m/ml Dexamethasone;Moist  Heat;Ultrasound;Neuromuscular re-education;Therapeutic exercise;Therapeutic activities;Manual techniques;Dry needling;Passive range of motion;Taping    PT Next Visit Plan progress shoulder strengthening    PT Home Exercise Plan [id number]    Consulted and Agree with Plan of Care Patient           Patient will benefit from skilled therapeutic intervention in order to improve the following deficits and impairments:  Decreased range of motion, Increased muscle spasms, Impaired UE functional use, Decreased activity tolerance, Pain, Impaired flexibility, Improper body mechanics, Decreased mobility, Decreased strength, Postural dysfunction  Visit Diagnosis: Thoracolumbar back pain  Other symptoms and signs involving the musculoskeletal system  Acute pain of right shoulder  Abnormal posture  Muscle weakness (generalized)     Problem List Patient Active Problem List   Diagnosis Date Noted  . 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 Hepatic steatosis 11/13/2018  . Tobacco use 07/18/2018  . Sun-induced skin changes, keratosis 08/08/2017  . Subcutaneous cyst 08/08/2017  . Irritable bowel syndrome (IBS) 05/04/2017  . Generalized anxiety disorder 03/16/2016  . Social anxiety disorder 03/16/2016  . Major depressive disorder, recurrent episode, moderate ([place] 03/16/2016  . Right inguinal hernia 07/14/2015  . Hallux valgus 12/09/2014  . Acute headache 07/31/2014  . Chest pain 07/31/2014  . Hypotension 07/31/2014  . Other and unspecified ovarian cyst 06/25/2013  . Left tennis elbow 06/25/2013  . Left breast mass 06/27/2012  . Microscopic hematuria 06/27/2012  . Hyperlipidemia 06/14/2012  . Prediabetes 06/14/2012  . History of leukocytosis 06/14/2012  . Seasonal allergies 06/14/2012  . Depression 06/14/2012  . ADD (attention deficit disorder) 06/14/2012   JKerin Perna PTA 01/02/20 6:08 PM  [place] [address]Phone: [phone]  Fax:  [phone] Name: Sarah SchermerhornMRN: [id number] of Birth: 111/03/1970

## 2020-01-08 ENCOUNTER — Ambulatory Visit (INDEPENDENT_AMBULATORY_CARE_PROVIDER_SITE_OTHER): Payer: 59 | Admitting: Rehabilitative and Restorative Service Providers"

## 2020-01-08 ENCOUNTER — Encounter: Payer: Self-pay | Admitting: Rehabilitative and Restorative Service Providers"

## 2020-01-08 ENCOUNTER — Other Ambulatory Visit: Payer: Self-pay

## 2020-01-08 DIAGNOSIS — M545 Low back pain, unspecified: Secondary | ICD-10-CM | POA: Diagnosis not present

## 2020-01-08 DIAGNOSIS — M546 Pain in thoracic spine: Secondary | ICD-10-CM

## 2020-01-08 DIAGNOSIS — R29898 Other symptoms and signs involving the musculoskeletal system: Secondary | ICD-10-CM

## 2020-01-08 DIAGNOSIS — M25511 Pain in right shoulder: Secondary | ICD-10-CM | POA: Diagnosis not present

## 2020-01-08 DIAGNOSIS — M6281 Muscle weakness (generalized): Secondary | ICD-10-CM

## 2020-01-08 DIAGNOSIS — R293 Abnormal posture: Secondary | ICD-10-CM

## 2020-01-08 NOTE — Patient Instructions (Addendum)
IONTOPHORESIS PATIENT PRECAUTIONS & CONTRAINDICATIONS:  . Redness under one or both electrodes can occur.  This characterized by a uniform redness that usually disappears within 12 hours of treatment. . Small pinhead size blisters may result in response to the drug.  Contact your physician if the problem persists more than 24 hours. . On rare occasions, iontophoresis therapy can result in temporary skin reactions such as rash, inflammation, irritation or burns.  The skin reactions may be the result of individual sensitivity to the ionic solution used, the condition of the skin at the start of treatment, reaction to the materials in the electrodes, allergies or sensitivity to dexamethasone, or a poor connection between the patch and your skin.  Discontinue using iontophoresis if you have any of these reactions and report to your therapist. . Remove the Patch or electrodes if you have any undue sensation of pain or burning during the treatment and report discomfort to your therapist. . Tell your Therapist if you have had known adverse reactions to the application of electrical current. . If using the Patch, the LED light will turn off when treatment is complete and the patch can be removed.  Approximate treatment time is 1-3 hours.  Remove the patch when light goes off or after 6 hours. . The Patch can be worn during normal activity, however excessive motion where the electrodes have been placed can cause poor contact between the skin and the electrode or uneven electrical current resulting in greater risk of skin irritation. [name] Keep out of the reach of children.   . DO NOT use if you have a cardiac pacemaker or any other electrically sensitive implanted device. . DO NOT use if you have a known sensitivity to dexamethasone. . DO NOT use during Magnetic Resonance Imaging (MRI). . DO NOT use over broken or compromised skin (e.g. sunburn, cuts, or acne) due to the increased risk of skin reaction. . DO  NOT SHAVE over the area to be treated:  To establish good contact between the Patch and the skin, excessive hair may be clipped. . DO NOT place the Patch or electrodes on or over your eyes, directly over your heart, or brain. . DO NOT reuse the Patch or electrodes as this may cause burns to occur.  Trigger Point Dry Needling  . What is Trigger Point Dry Needling (DN)? o DN is a physical therapy technique used to treat muscle pain and dysfunction. Specifically, DN helps deactivate muscle trigger points (muscle knots).  o A thin filiform needle is used to penetrate the skin and stimulate the underlying trigger point. The goal is for a local twitch response (LTR) to occur and for the trigger point to relax. No medication of any kind is injected during the procedure.   . What Does Trigger Point Dry Needling Feel Like?  o The procedure feels different for each individual patient. Some patients report that they do not actually feel the needle enter the skin and overall the process is not painful. Very mild bleeding may occur. However, many patients feel a deep cramping in the muscle in which the needle was inserted. This is the local twitch response.   [name] How Will I feel after the treatment? o Soreness is normal, and the onset of soreness may not occur for a few hours. Typically this soreness does not last longer than two days.  o Bruising is uncommon, however; ice can be used to decrease any possible bruising.  o In rare cases feeling tired or  nauseous after the treatment is normal. In addition, your symptoms may get worse before they get better, this period will typically not last longer than 24 hours.   . What Can I do After My Treatment? o Increase your hydration by drinking more water for the next 24 hours. o You may place ice or heat on the areas treated that have become sore, however, do not use heat on inflamed or bruised areas. Heat often brings more relief post needling. o You can continue  your regular activities, but vigorous activity is not recommended initially after the treatment for 24 hours. o DN is best combined with other physical therapy such as strengthening, stretching, and other therapies.

## 2020-01-08 NOTE — Therapy (Signed)
[place] [facility] [place], [place], [address] Phone: [phone]   Fax:  [phone]  Physical Therapy Treatment  Patient Details  Name: Sarah Nicholson MRN: [id number] Date of Birth: Aug 07, 1970 Referring Provider (PT): Dr Ophelia Charter; Dr Emeterio Reeve    Encounter Date: 01/08/2020   PT End of Session - 01/08/20 1519    Visit Number 19    Number of Visits 28    Date for PT Re-Evaluation 02/03/20    PT Start Time 7824    PT Stop Time 1605    PT Time Calculation (min) 50 min    Activity Tolerance Patient tolerated treatment well           Past Medical History:  Diagnosis Date  . ADD (attention deficit disorder) 06/14/2012  . Anxiety   . Bronchitis   . Complication of anesthesia 1987   states woke up during appedentomy  . Depression 06/14/2012  . Hepatic steatosis 11/13/2018   Hepatic steatosis with hepatomegaly seen on MRI abd on 11/2018  . History of leukocytosis 06/14/2012   Reports "WBC 32" decades ago with negative hematology specialist workup.   [name] Hyperlipidemia 06/14/2012  . Seasonal allergies 06/14/2012    Past Surgical History:  Procedure Laterality Date  . APPENDECTOMY    . BICEPT TENODESIS Right 10/10/2019   Procedure: BICEPS TENODESIS;  Surgeon: Hiram Gash, MD;  Location: [facility];  Service: Orthopedics;  Laterality: Right;  . bladder sling/mesh    . INGUINAL HERNIA REPAIR    . intrauterine ablation    . NASAL SINUS SURGERY    . TUBAL LIGATION      There were no vitals filed for this visit.   Subjective Assessment - 01/08/20 1716    Subjective Shoulder is hurting again now. Pain in the front of the shoulder and feels like the nerve block has worn off.    Currently in Pain? Yes    Pain Score 4     Pain Location Shoulder    Pain Orientation Right;Anterior    Pain Descriptors / Indicators Burning;Tightness    Pain Type Chronic pain    Pain Radiating Towards anterior  shoulder into arm    Pain Onset More than a month ago    Pain Frequency Intermittent    Aggravating Factors  using arm              [facility] PT Assessment - 01/08/20 0001      Assessment   Medical Diagnosis Rt biceps tenodesis; SAD; DCR; mid to LBP      Referring Provider (PT) Dr Ophelia Charter; Dr Emeterio Reeve     Onset Date/Surgical Date 10/10/19   12/17/19   Hand Dominance Right    Next MD Visit PRN    Prior Therapy yes PT for shd at another facility      Observation/Other Assessments   Focus on Therapeutic Outcomes (FOTO)  43% limitation      AROM   Right Shoulder Extension 46 Degrees    Right Shoulder Flexion 143 Degrees    Right Shoulder ABduction 153 Degrees    Right Shoulder Internal Rotation --   thumb to T9   Right Shoulder External Rotation 69 Degrees      Palpation   Palpation comment muscular tightness through the Rt shoulder girdle including anterior along bicepital groove; pecs; upper trap; leveator; biceps; anterior deltoid;  [facility] Adult PT Treatment/Exercise - 01/08/20 0001      Shoulder Exercises: Standing   Flexion --   hOLD   Extension Strengthening;Both;10 reps;Theraband    Theraband Level (Shoulder Extension) Level 2 (Red)    Row Both;10 reps    Theraband Level (Shoulder Row) Level 2 (Red)    Retraction Strengthening;Both;10 reps;Theraband    Theraband Level (Shoulder Retraction) Level 2 (Red)      Shoulder Exercises: Isometric Strengthening   External Rotation Limitations step out red TB 10 reps     Internal Rotation Limitations step out red TB       Shoulder Exercises: Stretch   Wall Stretch - Flexion Limitations step back at counter for shoulder stretch into flexion and lat stretch 30 sec x 2     Other Shoulder Stretches 3 position doorway stretch x 15 sec x 2 reps each position, minor cues on form.       Iontophoresis   Type of Iontophoresis Dexamethasone    Location anterior shoulder     Dose 120  mAmp/min; 1 cc    Time 8 hours       Manual Therapy   Manual therapy comments pt supine     Joint Mobilization circumduction of [place] movement     Soft tissue mobilization anterior shoulder/pecs/biceps/ant deltoid; upper trap; teres/lats    Myofascial Release anterior shoulder/chest    Scapular Mobilization Rt     Passive ROM Rt shoulder flexion; abduction; ER: IR in scapular plane to tissue limits and no pain; abduction in sidelying bringing UE over head                   PT Education - 01/08/20 1659    Education Details ionto DN    Person(s) Educated Patient    Methods Explanation;Handout    Comprehension Verbalized understanding            PT Short Term Goals - 01/02/20 1807      PT SHORT TERM GOAL #1   Title Patient independent in initial HEP    Time 6    Period Weeks    Status Achieved      PT SHORT TERM GOAL #2   Title Decrease pain and muscle spasms 75-100% allowing patient to return to normal functional activities    Time 6    Period Weeks    Status Achieved      PT SHORT TERM GOAL #3   Title Improve trunk mobilty and core strength allowing patient to return to all normal functional activities    Time 6    Period Weeks    Status Partially Met             PT Long Term Goals - 01/08/20 1707      PT LONG TERM GOAL #1   Title AROM Rt shoulder to WFL's and pain free    Time 12    Period Weeks    Status Partially Met    Target Date 02/03/20      PT LONG TERM GOAL #2   Title 4+/5 to 5/5 strength Rt shoulder    Time 12    Period Weeks    Status On-going    Target Date 02/03/20      PT LONG TERM GOAL #3   Title patient reports that she has returned to normal functional activities    Time 12    Period Weeks    Status On-going    Target Date 02/03/20  PT LONG TERM GOAL #4   Title Independent in HEP    Period Weeks    Status On-going    Target Date 02/03/20      PT LONG TERM GOAL #5   Title Improve FOTO to </= 44% limitation    Time  12    Period Weeks    Status Achieved                 Plan - 01/08/20 1710    Clinical Impression Statement Patient reports increased pain in the anterior Rt shoulder. She has continued limited AROM and functional strength in Rt UE. Patient has muscular tightness in the Rt shoulder girdle with TP in anterior Rt shoulder. Will benefit form continued treatment for Rt shoulder including manual work; joint mobs; PROM/stretching; strengthening. Trial of ionto anterior Rt shoulder    Rehab Potential Good    PT Frequency 2x / week    PT Duration 6 weeks    PT Treatment/Interventions Patient/family education;ADLs/Self Care Home Management;Aquatic Therapy;Cryotherapy;Electrical Stimulation;Iontophoresis 94m/ml Dexamethasone;Moist Heat;Ultrasound;Neuromuscular re-education;Therapeutic exercise;Therapeutic activities;Manual techniques;Dry needling;Passive range of motion;Taping    PT Next Visit Plan manual work Rt shoulder/shoulder girdle; PROM; progress shoulder strengthening; assess response to ionto; trial of DN    PT Home Exercise Plan [id number]    Consulted and Agree with Plan of Care Patient           Patient will benefit from skilled therapeutic intervention in order to improve the following deficits and impairments:     Visit Diagnosis: Thoracolumbar back pain  Other symptoms and signs involving the musculoskeletal system  Acute pain of right shoulder  Abnormal posture  Muscle weakness (generalized)     Problem List Patient Active Problem List   Diagnosis Date Noted  . 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 Hepatic steatosis 11/13/2018  . Tobacco use 07/18/2018  . Sun-induced skin changes, keratosis 08/08/2017  . Subcutaneous cyst 08/08/2017  . Irritable bowel syndrome (IBS) 05/04/2017  . Generalized anxiety disorder 03/16/2016  . Social anxiety disorder 03/16/2016  . Major depressive disorder, recurrent episode, moderate ([place] 03/16/2016  .  Right inguinal hernia 07/14/2015  . Hallux valgus 12/09/2014  . Acute headache 07/31/2014  . Chest pain 07/31/2014  . Hypotension 07/31/2014  . Other and unspecified ovarian cyst 06/25/2013  . Left tennis elbow 06/25/2013  . Left breast mass 06/27/2012  . Microscopic hematuria 06/27/2012  . Hyperlipidemia 06/14/2012  . Prediabetes 06/14/2012  . History of leukocytosis 06/14/2012  . Seasonal allergies 06/14/2012  . Depression 06/14/2012  . ADD (attention deficit disorder) 06/14/2012    Albi Rappaport PNilda SimmerPT, MPH  01/08/2020, 5:19 PM  [facility] [address]Phone: [phone]  Fax:  [phone] Name: Sarah ArtMRN: [id number] of Birth: [birth year]

## 2020-01-10 ENCOUNTER — Ambulatory Visit (INDEPENDENT_AMBULATORY_CARE_PROVIDER_SITE_OTHER): Payer: 59 | Admitting: Rehabilitative and Restorative Service Providers"

## 2020-01-10 ENCOUNTER — Other Ambulatory Visit: Payer: Self-pay

## 2020-01-10 ENCOUNTER — Encounter: Payer: Self-pay | Admitting: Rehabilitative and Restorative Service Providers"

## 2020-01-10 DIAGNOSIS — M25511 Pain in right shoulder: Secondary | ICD-10-CM

## 2020-01-10 DIAGNOSIS — R293 Abnormal posture: Secondary | ICD-10-CM

## 2020-01-10 DIAGNOSIS — M6281 Muscle weakness (generalized): Secondary | ICD-10-CM

## 2020-01-10 DIAGNOSIS — M546 Pain in thoracic spine: Secondary | ICD-10-CM

## 2020-01-10 DIAGNOSIS — M545 Low back pain, unspecified: Secondary | ICD-10-CM

## 2020-01-10 DIAGNOSIS — R29898 Other symptoms and signs involving the musculoskeletal system: Secondary | ICD-10-CM

## 2020-01-10 NOTE — Therapy (Signed)
[place], [place], [address] Phone: [phone]   Fax:  [phone]  Physical Therapy Treatment  Patient Details  Name: Sarah Nicholson MRN: [id number] Date of Birth: 1970/10/13 Referring Provider (PT): Dr Ophelia Charter; Dr Emeterio Reeve    Encounter Date: 01/10/2020   PT End of Session - 01/10/20 1533    Visit Number 20    Number of Visits 28    Date for PT Re-Evaluation 02/03/20    PT Start Time 3338    PT Stop Time 1618    PT Time Calculation (min) 48 min    Activity Tolerance Patient tolerated treatment well           Past Medical History:  Diagnosis Date  . ADD (attention deficit disorder) 06/14/2012  . Anxiety   . Bronchitis   . Complication of anesthesia 1987   states woke up during appedentomy  . Depression 06/14/2012  . Hepatic steatosis 11/13/2018   Hepatic steatosis with hepatomegaly seen on MRI abd on 11/2018  . History of leukocytosis 06/14/2012   Reports "WBC 32" decades ago with negative hematology specialist workup.   [name] Hyperlipidemia 06/14/2012  . Seasonal allergies 06/14/2012    Past Surgical History:  Procedure Laterality Date  . APPENDECTOMY    . BICEPT TENODESIS Right 10/10/2019   Procedure: BICEPS TENODESIS;  Surgeon: Hiram Gash, MD;  Location: [facility];  Service: Orthopedics;  Laterality: Right;  . bladder sling/mesh    . INGUINAL HERNIA REPAIR    . intrauterine ablation    . NASAL SINUS SURGERY    . TUBAL LIGATION      There were no vitals filed for this visit.   Subjective Assessment - 01/10/20 1533    Subjective Soreness in the Rt shoulder and continued pain in the front of the shoulder. She is working on her exercises again. Back is "much better"    Currently in Pain? Yes    Pain Score 3     Pain Location Shoulder    Pain Orientation Right;Anterior    Pain Descriptors / Indicators Tightness;Tingling    Pain Type Chronic pain     Pain Onset More than a month ago    Pain Frequency Intermittent                             [facility] Adult PT Treatment/Exercise - 01/10/20 0001      Shoulder Exercises: Supine   Other Supine Exercises AAROM in all planes PT assist       Shoulder Exercises: Pulleys   Flexion 2 minutes    Scaption 2 minutes      Shoulder Exercises: ROM/Strengthening   Pendulum arm swings and hangs post DN/manual work       Manual Therapy   Manual therapy comments pt supine     Joint Mobilization circumduction of GH movement     Soft tissue mobilization anterior shoulder/pecs/biceps/ant deltoid; upper trap; teres/lats    Myofascial Release anterior shoulder/chest    Scapular Mobilization Rt     Passive ROM Rt shoulder flexion; abduction; ER: IR in scapular plane to tissue limits and no pain; abduction in sidelying bringing UE over head             Trigger Point Dry Needling - 01/10/20 0001    Consent Given? Yes    Education Handout Provided Yes    Other Dry Needling  Rt    Biceps Response Palpable increased muscle length                PT Education - 01/10/20 1535    Education Details DN    Person(s) Educated Patient    Methods Explanation;Handout    Comprehension Verbalized understanding            PT Short Term Goals - 01/02/20 1807      PT SHORT TERM GOAL #1   Title Patient independent in initial HEP    Time 6    Period Weeks    Status Achieved      PT SHORT TERM GOAL #2   Title Decrease pain and muscle spasms 75-100% allowing patient to return to normal functional activities    Time 6    Period Weeks    Status Achieved      PT SHORT TERM GOAL #3   Title Improve trunk mobilty and core strength allowing patient to return to all normal functional activities    Time 6    Period Weeks    Status Partially Met             PT Long Term Goals - 01/08/20 1707      PT LONG TERM GOAL #1   Title AROM Rt shoulder to WFL's and pain free    Time 12     Period Weeks    Status Partially Met    Target Date 02/03/20      PT LONG TERM GOAL #2   Title 4+/5 to 5/5 strength Rt shoulder    Time 12    Period Weeks    Status On-going    Target Date 02/03/20      PT LONG TERM GOAL #3   Title patient reports that she has returned to normal functional activities    Time 12    Period Weeks    Status On-going    Target Date 02/03/20      PT LONG TERM GOAL #4   Title Independent in HEP    Period Weeks    Status On-going    Target Date 02/03/20      PT LONG TERM GOAL #5   Title Improve FOTO to </= 44% limitation    Time 12    Period Weeks    Status Achieved                 Plan - 01/10/20 1615    Clinical Impression Statement Patient reports no difference with ionto patch. She has continued with exercises at home. Patient tolerated DN and manual work well with report of decreased pain and increased freedom of movement following treatment. Noted improved tissue extensibility and increased PROM following DN and manual work.    Rehab Potential Good    PT Frequency 2x / week    PT Duration 6 weeks    PT Treatment/Interventions Patient/family education;ADLs/Self Care Home Management;Aquatic Therapy;Cryotherapy;Electrical Stimulation;Iontophoresis 78m/ml Dexamethasone;Moist Heat;Ultrasound;Neuromuscular re-education;Therapeutic exercise;Therapeutic activities;Manual techniques;Dry needling;Passive range of motion;Taping    PT Next Visit Plan manual work Rt shoulder/shoulder girdle; PROM; progress shoulder strengthening; assess response to ionto; assess response to trial of DN    PT Home Exercise Plan [id number]    Consulted and Agree with Plan of Care Patient           Patient will benefit from skilled therapeutic intervention in order to improve the following deficits and impairments:     Visit Diagnosis: Thoracolumbar back pain  Other  symptoms and signs involving the musculoskeletal system  Acute pain of right  shoulder  Abnormal posture  Muscle weakness (generalized)     Problem List Patient Active Problem List   Diagnosis Date Noted  . 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 Hepatic steatosis 11/13/2018  . Tobacco use 07/18/2018  . Sun-induced skin changes, keratosis 08/08/2017  . Subcutaneous cyst 08/08/2017  . Irritable bowel syndrome (IBS) 05/04/2017  . Generalized anxiety disorder 03/16/2016  . Social anxiety disorder 03/16/2016  . Major depressive disorder, recurrent episode, moderate ([place]) 03/16/2016  . Right inguinal hernia 07/14/2015  . Hallux valgus 12/09/2014  . Acute headache 07/31/2014  . Chest pain 07/31/2014  . Hypotension 07/31/2014  . Other and unspecified ovarian cyst 06/25/2013  . Left tennis elbow 06/25/2013  . Left breast mass 06/27/2012  . Microscopic hematuria 06/27/2012  . Hyperlipidemia 06/14/2012  . Prediabetes 06/14/2012  . History of leukocytosis 06/14/2012  . Seasonal allergies 06/14/2012  . Depression 06/14/2012  . ADD (attention deficit disorder) 06/14/2012    Porschea Borys Nilda Simmer PT, MPH  01/10/2020, 4:18 PM  [facility] [place], [place], [address] Phone: [phone]   Fax:  [phone]  Name: Sarah Nicholson MRN: [id number] Date of Birth: 1970/04/15

## 2020-01-10 NOTE — Patient Instructions (Signed)

## 2020-02-03 ENCOUNTER — Ambulatory Visit (INDEPENDENT_AMBULATORY_CARE_PROVIDER_SITE_OTHER): Payer: 59 | Admitting: Physical Therapy

## 2020-02-03 ENCOUNTER — Encounter: Payer: Self-pay | Admitting: Physical Therapy

## 2020-02-03 ENCOUNTER — Other Ambulatory Visit: Payer: Self-pay

## 2020-02-03 DIAGNOSIS — R293 Abnormal posture: Secondary | ICD-10-CM

## 2020-02-03 DIAGNOSIS — R29898 Other symptoms and signs involving the musculoskeletal system: Secondary | ICD-10-CM

## 2020-02-03 DIAGNOSIS — M25511 Pain in right shoulder: Secondary | ICD-10-CM | POA: Diagnosis not present

## 2020-02-03 DIAGNOSIS — M6281 Muscle weakness (generalized): Secondary | ICD-10-CM

## 2020-02-03 NOTE — Therapy (Signed)
[facility] [place], [place], [address] Phone: [phone]   Fax:  [phone]  Physical Therapy Treatment  Patient Details  Name: Sarah Nicholson MRN: [id number] Date of Birth: January 25, 1971 Referring Provider (PT): Dr Ophelia Charter; Dr Emeterio Reeve    Encounter Date: 02/03/2020   PT End of Session - 02/03/20 1439    Visit Number 21    Number of Visits 28    Date for PT Re-Evaluation 02/03/20    PT Start Time 1962    PT Stop Time 1526    PT Time Calculation (min) 53 min    Activity Tolerance Patient tolerated treatment well;No increased pain    Behavior During Therapy WFL for tasks assessed/performed           Past Medical History:  Diagnosis Date  . ADD (attention deficit disorder) 06/14/2012  . Anxiety   . Bronchitis   . Complication of anesthesia 1987   states woke up during appedentomy  . Depression 06/14/2012  . Hepatic steatosis 11/13/2018   Hepatic steatosis with hepatomegaly seen on MRI abd on 11/2018  . History of leukocytosis 06/14/2012   Reports "WBC 32" decades ago with negative hematology specialist workup.   [name] Hyperlipidemia 06/14/2012  . Seasonal allergies 06/14/2012    Past Surgical History:  Procedure Laterality Date  . APPENDECTOMY    . BICEPT TENODESIS Right 10/10/2019   Procedure: BICEPS TENODESIS;  Surgeon: Hiram Gash, MD;  Location: [facility];  Service: Orthopedics;  Laterality: Right;  . bladder sling/mesh    . INGUINAL HERNIA REPAIR    . intrauterine ablation    . NASAL SINUS SURGERY    . TUBAL LIGATION      There were no vitals filed for this visit.   Subjective Assessment - 02/03/20 1440    Subjective No pain at rest, but pain/weakness in Rt shoulder with lifting items shoulder height or higher. Still unable to unhook bra with Rt hand.  Has been less consistent with HEP, mostly working functional tasks at [employer].  mid-low back no longer bothers  her.    Patient Stated Goals move arm; use arm for normal functional and work activities    Currently in Pain? No/denies    Pain Score 0-No pain              [facility] PT Assessment - 02/03/20 0001      Assessment   Medical Diagnosis Rt biceps tenodesis; SAD; DCR; mid to LBP      Referring Provider (PT) Dr Ophelia Charter; Dr Emeterio Reeve     Onset Date/Surgical Date 10/10/19   12/17/19   Hand Dominance Right    Next MD Visit PRN    Prior Therapy yes PT for shd at another facility      AROM   Right Shoulder Extension 40 Degrees    Right Shoulder Flexion 160 Degrees    Right Shoulder ABduction 145 Degrees    Right Shoulder Internal Rotation --   Thumb to T10   Right Shoulder External Rotation 70 Degrees      Strength   Strength Assessment Site Shoulder    Right/Left Shoulder Right    Right Shoulder Flexion 4+/5    Right Shoulder Extension 5/5    Right Shoulder ABduction 3+/5    Right Shoulder Internal Rotation 4/5    Right Shoulder External Rotation 4+/5            [facility] Adult PT  Treatment/Exercise - 02/03/20 0001      Shoulder Exercises: Standing   External Rotation Strengthening;Both;10 reps;Theraband   2 sets, 1 with each color   Theraband Level (Shoulder External Rotation) Level 1 (Yellow);Level 2 (Red)    Internal Rotation Strengthening;Right;10 reps;Theraband   2 sets   Theraband Level (Shoulder Internal Rotation) Level 2 (Red)    Flexion Right;10 reps;Weights   2 sets   Shoulder Flexion Weight (lbs) 2    ABduction Strengthening;Right;5 reps;10 reps   2 sets   Shoulder ABduction Weight (lbs) 2    Row Right;10 reps;Strengthening   2 sets   Theraband Level (Shoulder Row) Level 2 (Red)    Row Weight (lbs) --    Other Standing Exercises bicep curl to overhead press x 10 with 2# x 2 sets    Other Standing Exercises Rt tricep kick back with red band x 10 reps with red band in door x 2 sets      Shoulder Exercises: ROM/Strengthening   UBE (Upper Arm Bike) L1: 1 min  forward, 1 min backward, standing       Shoulder Exercises: Stretch   Wall Stretch - ABduction 2 reps;20 seconds    Other Shoulder Stretches midlevel doorway stretch x 20 sec x 2 reps, bilat and unilateral high position x 20 sec,  wall pec stretch with arm abdct 90 deg x 20 sec x 2 reps.  rt bicep stretch at counter x 30 sec     Other Shoulder Stretches Rt tricep stretch x 20 sec x 2 reps                    PT Short Term Goals - 01/02/20 1807      PT SHORT TERM GOAL #1   Title Patient independent in initial HEP    Time 6    Period Weeks    Status Achieved      PT SHORT TERM GOAL #2   Title Decrease pain and muscle spasms 75-100% allowing patient to return to normal functional activities    Time 6    Period Weeks    Status Achieved      PT SHORT TERM GOAL #3   Title Improve trunk mobilty and core strength allowing patient to return to all normal functional activities    Time 6    Period Weeks    Status Partially Met             PT Long Term Goals - 02/03/20 1446      PT LONG TERM GOAL #1   Title AROM Rt shoulder to WFL's and pain free    Time 12    Period Weeks    Status Partially Met      PT LONG TERM GOAL #2   Title 4+/5 to 5/5 strength Rt shoulder    Time 12    Period Weeks    Status On-going      PT LONG TERM GOAL #3   Title patient reports that she has returned to normal functional activities    Time 12    Period Weeks    Status Partially Met      PT LONG TERM GOAL #4   Title Independent in HEP    Period Weeks    Status On-going      PT LONG TERM GOAL #5   Title Improve FOTO to </= 44% limitation    Time 12    Period Weeks    Status Achieved  Plan - 02/03/20 1447    Clinical Impression Statement Continued limited end range of Rt shoulder IR ROM, and strength in Rt IR, ER, flexion, and abdct.  She tolerated lifting 2# with most exercises, reporting fatigue but no pain. Encouraged pt to continue stretching daily,  and perform strengthening every other day to load tissue for strengthening.  Pt making progress towards remaining goals.    Rehab Potential Good    PT Frequency 2x / week    PT Duration 6 weeks    PT Treatment/Interventions Patient/family education;ADLs/Self Care Home Management;Aquatic Therapy;Cryotherapy;Electrical Stimulation;Iontophoresis 34m/ml Dexamethasone;Moist Heat;Ultrasound;Neuromuscular re-education;Therapeutic exercise;Therapeutic activities;Manual techniques;Dry needling;Passive range of motion;Taping    PT Next Visit Plan End of POC - assess response to HEP, renew vs hold    PT Home Exercise Plan [email or web address]    Consulted and Agree with Plan of Care Patient           Patient will benefit from skilled therapeutic intervention in order to improve the following deficits and impairments:     Visit Diagnosis: Muscle weakness (generalized)  Acute pain of right shoulder  Abnormal posture  Other symptoms and signs involving the musculoskeletal system     Problem List Patient Active Problem List   Diagnosis Date Noted  . 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 Hepatic steatosis 11/13/2018  . Tobacco use 07/18/2018  . Sun-induced skin changes, keratosis 08/08/2017  . Subcutaneous cyst 08/08/2017  . Irritable bowel syndrome (IBS) 05/04/2017  . Generalized anxiety disorder 03/16/2016  . Social anxiety disorder 03/16/2016  . Major depressive disorder, recurrent episode, moderate ([place] 03/16/2016  . Right inguinal hernia 07/14/2015  . Hallux valgus 12/09/2014  . Acute headache 07/31/2014  . Chest pain 07/31/2014  . Hypotension 07/31/2014  . Other and unspecified ovarian cyst 06/25/2013  . Left tennis elbow 06/25/2013  . Left breast mass 06/27/2012  . Microscopic hematuria 06/27/2012  . Hyperlipidemia 06/14/2012  . Prediabetes 06/14/2012  . History of leukocytosis 06/14/2012  . Seasonal allergies 06/14/2012  . Depression 06/14/2012  . ADD  (attention deficit disorder) 06/14/2012   JKerin Perna PTA 02/03/20 3:53 PM  [address] [place] [address]Phone: [phone]  Fax:  [phone] Name: CAreonna BranMRN: [id number] of Birth: [birth year]

## 2020-02-03 NOTE — Patient Instructions (Signed)
Access Code: [email or web address]: [email or web address]: 11/22/2021Prepared by: [facility]villeExercises  Standing Shoulder and Trunk Flexion at Table - 2 x daily - 7 x weekly - 1 sets - 5 reps - 10-15 sec hold  Standing Shoulder Internal Rotation Stretch with Hands Behind Back - 1 x daily - 7 x weekly - 1 sets - 2 reps - 15-30 hold  Standing Shoulder Extension with Dowel - 1 x daily - 7 x weekly - 1 sets - 2-3 reps - 15-30 hold  Doorway Pec Stretch at 90 Degrees Abduction - 2 x daily - 7 x weekly - 1 sets - 2 reps - 20 hold  Latissimus Dorsi Stretch at Wall - 1 x daily - 7 x weekly - 1 sets - 2-3 reps - 15-20 hold  Supine Chest Stretch with Elbows Bent - 1 x daily - 7 x weekly - 1 sets - 2 reps - 15-30 hold  Standing Shoulder Abduction Slides at Wall - 1 x daily - 7 x weekly - 1 sets - 20 hold  Tricep Push Up on Wall - 1 x daily - 3 x weekly - 1 sets - 10 reps  Standing Bilateral Low Shoulder Row with Anchored Resistance - 1 x daily - 3 x weekly - 2 sets - 10 reps - 2-3 sec hold  Standing Single Arm Shoulder Abduction with Dumbbell - Palm Down - 1 x daily - 3 x weekly - 2 sets - 10 reps  Standing Shoulder Flexion to 90 Degrees with Dumbbells - 1 x daily - 3 x weekly - 2 sets - 10 reps  Bent Over Tricep Extension with Counter Support - 1 x daily - 3 x weekly - 2 sets - 10 reps  Standing Shoulder Internal Rotation with Anchored Resistance - 1 x daily - 3 x weekly - 2 sets - 10 reps  Alternating Shoulder Press on [employer] - 1 x daily - 3 x weekly - 2 sets - 10 reps

## 2020-02-12 ENCOUNTER — Encounter: Payer: 59 | Admitting: Physical Therapy

## 2020-02-14 ENCOUNTER — Encounter: Payer: 59 | Admitting: Rehabilitative and Restorative Service Providers"

## 2020-02-17 ENCOUNTER — Encounter: Payer: 59 | Admitting: Rehabilitative and Restorative Service Providers"

## 2020-02-20 ENCOUNTER — Encounter: Payer: 59 | Admitting: Physical Therapy

## 2020-02-23 ENCOUNTER — Telehealth (HOSPITAL_COMMUNITY): Payer: Self-pay | Admitting: Family

## 2020-02-23 NOTE — Telephone Encounter (Signed)
Called to discuss with Sarah Nicholson about Covid symptoms and the use of casirivimab/imdevimab, a combination monoclonal antibody infusion for those with mild to moderate Covid symptoms and at a high risk of hospitalization.     Pt does not qualify for infusion therapy as she states she has no symptoms of covid. She shares that she is trying to cut back on smoking as her BP has been elevated. Offered hotline phone number but patient declined the number.     Patient Active Problem List   Diagnosis Date Noted  . 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 Hepatic steatosis 11/13/2018  . Tobacco use 07/18/2018  . Sun-induced skin changes, keratosis 08/08/2017  . Subcutaneous cyst 08/08/2017  . Irritable bowel syndrome (IBS) 05/04/2017  . Generalized anxiety disorder 03/16/2016  . Social anxiety disorder 03/16/2016  . Major depressive disorder, recurrent episode, moderate ([place]) 03/16/2016  . Right inguinal hernia 07/14/2015  . Hallux valgus 12/09/2014  . Acute headache 07/31/2014  . Chest pain 07/31/2014  . Hypotension 07/31/2014  . Other and unspecified ovarian cyst 06/25/2013  . Left tennis elbow 06/25/2013  . Left breast mass 06/27/2012  . Microscopic hematuria 06/27/2012  . Hyperlipidemia 06/14/2012  . Prediabetes 06/14/2012  . History of leukocytosis 06/14/2012  . Seasonal allergies 06/14/2012  . Depression 06/14/2012  . ADD (attention deficit disorder) 06/14/2012    Youssef Footman,NP

## 2020-02-27 ENCOUNTER — Encounter: Payer: 59 | Admitting: Physical Therapy

## 2020-02-28 ENCOUNTER — Telehealth (INDEPENDENT_AMBULATORY_CARE_PROVIDER_SITE_OTHER): Payer: 59 | Admitting: Nurse Practitioner

## 2020-02-28 ENCOUNTER — Encounter: Payer: Self-pay | Admitting: Nurse Practitioner

## 2020-02-28 DIAGNOSIS — I10 Essential (primary) hypertension: Secondary | ICD-10-CM | POA: Diagnosis not present

## 2020-02-28 MED ORDER — HYDROCHLOROTHIAZIDE 12.5 MG PO TABS: 12.5000 mg | ORAL_TABLET | Freq: Every day | ORAL | 3 refills | Status: DC

## 2020-02-28 NOTE — Patient Instructions (Signed)
Please check your blood pressure daily and let me know in about a week with a mychart message what the numbers have been.   I have called a medication called hydrochlorothiazide (HCTZ) to the pharmacy for you. Take this daily. Let me know if you begin to have any side effects or issues with the medication.   Hypertension, Adult Hypertension is another name for high blood pressure. High blood pressure forces your heart to work harder to pump blood. This can cause problems over time. There are two numbers in a blood pressure reading. There is a top number (systolic) over a bottom number (diastolic). It is best to have a blood pressure that is below 120/80. Healthy choices can help lower your blood pressure, or you may need medicine to help lower it. What are the causes? The cause of this condition is not known. Some conditions may be related to high blood pressure. What increases the risk?  Smoking.  Having type 2 diabetes mellitus, high cholesterol, or both.  Not getting enough exercise or physical activity.  Being overweight.  Having too much fat, sugar, calories, or salt (sodium) in your diet.  Drinking too much alcohol.  Having long-term (chronic) kidney disease.  Having a family history of high blood pressure.  Age. Risk increases with age.  Race. You may be at higher risk if you are African American.  Gender. Men are at higher risk than women before age 28. After age 107, women are at higher risk than men.  Having obstructive sleep apnea.  Stress. What are the signs or symptoms?  High blood pressure may not cause symptoms. Very high blood pressure (hypertensive crisis) may cause: ? Headache. ? Feelings of worry or nervousness (anxiety). ? Shortness of breath. ? Nosebleed. ? A feeling of being sick to your stomach (nausea). ? Throwing up (vomiting). ? Changes in how you see. ? Very bad chest pain. ? Seizures. How is this treated?  This condition is treated by  making healthy lifestyle changes, such as: ? Eating healthy foods. ? Exercising more. ? Drinking less alcohol.  Your health care provider may prescribe medicine if lifestyle changes are not enough to get your blood pressure under control, and if: ? Your top number is above 130. ? Your bottom number is above 80.  Your personal target blood pressure may vary. Follow these instructions at home: Eating and drinking   If told, follow the DASH eating plan. To follow this plan: ? Fill one half of your plate at each meal with fruits and vegetables. ? Fill one fourth of your plate at each meal with whole grains. Whole grains include whole-wheat pasta, brown rice, and whole-grain bread. ? Eat or drink low-fat dairy products, such as skim milk or low-fat yogurt. ? Fill one fourth of your plate at each meal with low-fat (lean) proteins. Low-fat proteins include fish, chicken without skin, eggs, beans, and tofu. ? Avoid fatty meat, cured and processed meat, or chicken with skin. ? Avoid pre-made or processed food.  Eat less than 1,500 mg of salt each day.  Do not drink alcohol if: ? Your doctor tells you not to drink. ? You are pregnant, may be pregnant, or are planning to become pregnant.  If you drink alcohol: ? Limit how much you use to:  0-1 drink a day for women.  0-2 drinks a day for men. ? Be aware of how much alcohol is in your drink. In the [place] one drink equals one 12 oz  bottle of beer (355 mL), one 5 oz glass of wine (148 mL), or one 1 oz glass of hard liquor (44 mL). Lifestyle   Work with your doctor to stay at a healthy weight or to lose weight. Ask your doctor what the best weight is for you.  Get at least 30 minutes of exercise most days of the week. This may include walking, swimming, or biking.  Get at least 30 minutes of exercise that strengthens your muscles (resistance exercise) at least 3 days a week. This may include lifting weights or doing Pilates.  Do not use  any products that contain nicotine or tobacco, such as cigarettes, e-cigarettes, and chewing tobacco. If you need help quitting, ask your doctor.  Check your blood pressure at home as told by your doctor.  Keep all follow-up visits as told by your doctor. This is important. Medicines  Take over-the-counter and prescription medicines only as told by your doctor. Follow directions carefully.  Do not skip doses of blood pressure medicine. The medicine does not work as well if you skip doses. Skipping doses also puts you at risk for problems.  Ask your doctor about side effects or reactions to medicines that you should watch for. Contact a doctor if you:  Think you are having a reaction to the medicine you are taking.  Have headaches that keep coming back (recurring).  Feel dizzy.  Have swelling in your ankles.  Have trouble with your vision. Get help right away if you:  Get a very bad headache.  Start to feel mixed up (confused).  Feel weak or numb.  Feel faint.  Have very bad pain in your: ? Chest. ? Belly (abdomen).  Throw up more than once.  Have trouble breathing. Summary  Hypertension is another name for high blood pressure.  High blood pressure forces your heart to work harder to pump blood.  For most people, a normal blood pressure is less than 120/80.  Making healthy choices can help lower blood pressure. If your blood pressure does not get lower with healthy choices, you may need to take medicine. This information is not intended to replace advice given to you by your health care provider. Make sure you discuss any questions you have with your health care provider. Document Revised: 11/08/2017 Document Reviewed: 11/08/2017 Elsevier Patient Education  [place].  Hydrochlorothiazide, HCTZ Oral Capsules or Tablets What is this medicine? HYDROCHLOROTHIAZIDE (hye droe klor oh THYE a zide) is a diuretic. It helps you make more urine and to lose salt  and excess water from your body. It treats swelling from heart, kidney, or liver disease. It also treats high blood pressure. This medicine may be used for other purposes; ask your health care provider or pharmacist if you have questions. COMMON BRAND NAME(S): Esidrix, Ezide, HydroDIURIL, Microzide, Oretic, Zide What should I tell my health care provider before I take this medicine? They need to know if you have any of these conditions:  diabetes  gout  immune system problems, like lupus  kidney disease or kidney stones  liver disease  pancreatitis  small amount of urine or difficulty passing urine  an unusual or allergic reaction to hydrochlorothiazide, sulfa drugs, other medicines, foods, dyes, or preservatives  pregnant or trying to get pregnant  breast-feeding How should I use this medicine? Take this drug by mouth. Take it as directed on the prescription label at the same time every day. You can take it with or without food. If it  upsets your stomach, take it with food. Keep taking it unless your health care provider tells you to stop. Talk to your health care provider about the use of this drug in children. While it may be prescribed for children as young as newborns for selected conditions, precautions do apply. Overdosage: If you think you have taken too much of this medicine contact a poison control center or emergency room at once. NOTE: This medicine is only for you. Do not share this medicine with others. What if I miss a dose? If you miss a dose, take it as soon as you can. If it is almost time for your next dose, take only that dose. Do not take double or extra doses. What may interact with this medicine?  cholestyramine  colestipol  digoxin  dofetilide  lithium  medicines for blood pressure  medicines for diabetes  medicines that relax muscles for surgery  other diuretics  steroid medicines like prednisone or cortisone This list may not describe all  possible interactions. Give your health care provider a list of all the medicines, herbs, non-prescription drugs, or dietary supplements you use. Also tell them if you smoke, drink alcohol, or use illegal drugs. Some items may interact with your medicine. What should I watch for while using this medicine? Visit your doctor or health care professional for regular checks on your progress. Check your blood pressure as directed. Ask your doctor or health care professional what your blood pressure should be and when you should contact him or her. Talk to your health care professional about your risk of skin cancer. You may be more at risk for skin cancer if you take this medicine. This medicine can make you more sensitive to the sun. Keep out of the sun. If you cannot avoid being in the sun, wear protective clothing and use sunscreen. Do not use sun lamps or tanning beds/booths. You may need to be on a special diet while taking this medicine. Ask your doctor. Check with your doctor or health care professional if you get an attack of severe diarrhea, nausea and vomiting, or if you sweat a lot. The loss of too much body fluid can make it dangerous for you to take this medicine. You may get drowsy or dizzy. Do not drive, use machinery, or do anything that needs mental alertness until you know how this medicine affects you. Do not stand or sit up quickly, especially if you are an older patient. This reduces the risk of dizzy or fainting spells. Alcohol may interfere with the effect of this medicine. Avoid alcoholic drinks. This medicine may increase blood sugar. Ask your healthcare provider if changes in diet or medicines are needed if you have diabetes. What side effects may I notice from receiving this medicine? Side effects that you should report to your doctor or health care professional as soon as possible:  allergic reactions such as skin rash or itching, hives, swelling of the lips, mouth, tongue, or  throat  changes in vision  chest pain  eye pain  fast or irregular heartbeat  feeling faint or lightheaded, falls  gout attack  muscle pain or cramps  pain or difficulty when passing urine  pain, tingling, numbness in the hands or feet  redness, blistering, peeling or loosening of the skin, including inside the mouth   signs and symptoms of high blood sugar such as being more thirsty or hungry or having to urinate more than normal. You may also feel very tired or  have blurry vision.  unusually weak Side effects that usually do not require medical attention (report to your doctor or health care professional if they continue or are bothersome):  change in sex drive or performance  dry mouth  headache  stomach upset This list may not describe all possible side effects. Call your doctor for medical advice about side effects. You may report side effects to FDA at 1-800-FDA-1088. Where should I keep my medicine? Keep out of the reach of children and pets. Store at room temperature between 20 and 25 degrees C (68 and 77 degrees F). Protect from light and moisture. Keep the container tightly closed. Do not freeze. Throw away any unused drug after the expiration date. NOTE: This sheet is a summary. It may not cover all possible information. If you have questions about this medicine, talk to your doctor, pharmacist, or health care provider.  2020 Elsevier/Gold Standard (2018-11-01 16:52:59)

## 2020-02-28 NOTE — Progress Notes (Signed)
Virtual Video Visit via MyChart Note  I connected with  Sarah Nicholson on 02/28/20 at  1:10 PM EST by the video enabled telemedicine application for , MyChart, and verified that I am speaking with the correct person using two identifiers.   I introduced myself as a Designer, jewellery with the practice. We discussed the limitations of evaluation and management by telemedicine and the availability of in person appointments. The patient expressed understanding and agreed to proceed.  Participating parties in this visit include: The patient and the nurse practitioner listed  The patient is: at home I am: in the office  Subjective:    CC:  Chief Complaint  Patient presents with  . Hypertension    HPI: Sarah Nicholson is a 49 y.o. y/o female presenting via [place] today for hypertension at home since diagnosis of COVID 9 days ago.She reports that her blood pressure is usually not elevated- occasionally she will have elevation in her diastolic in the office when she has smoked recently. She has been checking her blood pressure regularly and reports the following readings: 158/103, 161/90, 148/81, 170/101, 161/100, 160/66, 167/102, 168/99.  She is still under quarantine and unable to come to the office for evaluation at this time.  She denies headache, vision changes, or dizziness.   Past medical history, Surgical history, Family history not pertinant except as noted below, Social history, Allergies, and medications have been entered into the medical record, reviewed, and corrections made.   Review of Systems:  All review of systems negative except what is listed in the HPI   Objective:    General: Speaking clearly in complete sentences.  Cough and mild dyspnea appear present.  No wheezing noted audibly.    Alert and oriented x3.   Normal judgment.  No apparent acute distress.   Impression and Recommendations:    1. Hypertension, unspecified type Elevated blood  pressures since COVID-19 diagnosis 9 days ago. Given the significant elevation in numbers, we will begin treatment today with low dose duiretic. Patient to monitor blood pressure at home at least once a day and report findings in 1 week.  This is the patients second round with COVID since pandemic onset and is a current every day smoker, therefore, there is concern for decreased lung function that may be present. At this time it would not be ideal to monitor for chronic pulmonary changes given the acute illness, but if her symptoms persist and she continues to have cough or shortness of breath it would be worth evaluating for the possible presence of chronic pulmonary changes to ensure appropriate treatment modalities are initiated as quickly as possible.  Discussed goal blood pressure around 295 systolic and less than 80 diastolic at this time.  We will obtain labs and in person evaluation once the patient has recovered to the point that she is able to return to the office. For now will allow self monitoring at home with close observation of blood pressure readings. Recommend follow-up sooner if symptoms worsen or if BP is not controlled with current measures.     - hydrochlorothiazide (HYDRODIURIL) 12.5 MG tablet; Take 1 tablet (12.5 mg total) by mouth daily.  Dispense: 30 tablet; Refill: 3     I discussed the assessment and treatment plan with the patient. The patient was provided an opportunity to ask questions and all were answered. The patient agreed with the plan and demonstrated an understanding of the instructions.   The patient was advised to call  back or seek an in-person evaluation if the symptoms worsen or if the condition fails to improve as anticipated.  I provided 20 minutes of non-face-to-face interaction with this [place] visit including intake, same-day documentation, and chart review.   Orma Render, NP

## 2020-03-09 ENCOUNTER — Other Ambulatory Visit: Payer: Self-pay

## 2020-03-09 ENCOUNTER — Encounter: Payer: Self-pay | Admitting: Physical Therapy

## 2020-03-09 ENCOUNTER — Ambulatory Visit (INDEPENDENT_AMBULATORY_CARE_PROVIDER_SITE_OTHER): Payer: 59 | Admitting: Physical Therapy

## 2020-03-09 DIAGNOSIS — M25511 Pain in right shoulder: Secondary | ICD-10-CM | POA: Diagnosis not present

## 2020-03-09 DIAGNOSIS — M6281 Muscle weakness (generalized): Secondary | ICD-10-CM | POA: Diagnosis not present

## 2020-03-09 DIAGNOSIS — R293 Abnormal posture: Secondary | ICD-10-CM | POA: Diagnosis not present

## 2020-03-09 DIAGNOSIS — R29898 Other symptoms and signs involving the musculoskeletal system: Secondary | ICD-10-CM

## 2020-03-09 DIAGNOSIS — M546 Pain in thoracic spine: Secondary | ICD-10-CM

## 2020-03-09 DIAGNOSIS — M545 Low back pain, unspecified: Secondary | ICD-10-CM

## 2020-03-09 NOTE — Patient Instructions (Signed)
Access Code: [email or web address] URL: [email or web address] Date: 03/09/2020 Prepared by: Reggy Eye  Exercises Standing Shoulder and Trunk Flexion at Table - 2 x daily - 7 x weekly - 1 sets - 5 reps - 10-15 sec hold Standing Shoulder Internal Rotation Stretch with Hands Behind Back - 1 x daily - 7 x weekly - 1 sets - 2 reps - 15-30 hold Doorway Pec Stretch at 90 Degrees Abduction - 2 x daily - 7 x weekly - 1 sets - 2 reps - 20 hold Latissimus Dorsi Stretch at Wall - 1 x daily - 7 x weekly - 1 sets - 2-3 reps - 15-20 hold Supine Chest Stretch with Elbows Bent - 1 x daily - 7 x weekly - 1 sets - 2 reps - 15-30 hold Tricep Push Up on Wall - 1 x daily - 3 x weekly - 1 sets - 10 reps Standing Single Arm Shoulder Abduction with Dumbbell - Palm Down - 1 x daily - 3 x weekly - 2 sets - 10 reps Standing Shoulder Flexion to 90 Degrees with Dumbbells - 1 x daily - 3 x weekly - 2 sets - 10 reps Scaption with Dumbbells - 1 x daily - 7 x weekly - 2 sets - 10 reps Bent Over Tricep Extension with Counter Support - 1 x daily - 3 x weekly - 2 sets - 10 reps Standing Bilateral Low Shoulder Row with Anchored Resistance - 1 x daily - 3 x weekly - 2 sets - 10 reps - 2-3 sec hold Standing Shoulder Internal Rotation with Anchored Resistance - 1 x daily - 3 x weekly - 2 sets - 10 reps Seated Shoulder W External Rotation on Swiss Ball - 1 x daily - 7 x weekly - 2 sets - 10 reps

## 2020-03-09 NOTE — Therapy (Signed)
[facility] [address], [place], [address] Phone: [phone]   Fax:  [phone]  Physical Therapy Treatment and Discharge  Patient Details  Name: Sarah Nicholson MRN: [id number] Date of Birth: 1970/05/20 Referring Provider (PT): Dr Ophelia Charter; Dr Emeterio Reeve    Encounter Date: 03/09/2020   PT End of Session - 03/09/20 1633    Visit Number 22    Number of Visits 28    Date for PT Re-Evaluation 02/03/20    PT Start Time 1600    PT Stop Time 7867    PT Time Calculation (min) 34 min    Activity Tolerance Patient tolerated treatment well    Behavior During Therapy [facility] for tasks assessed/performed           Past Medical History:  Diagnosis Date   ADD (attention deficit disorder) 06/14/2012   Anxiety    Bronchitis    Complication of anesthesia 1987   states woke up during appedentomy   Depression 06/14/2012   Hepatic steatosis 11/13/2018   Hepatic steatosis with hepatomegaly seen on MRI abd on 11/2018   History of leukocytosis 06/14/2012   Reports "WBC 32" decades ago with negative hematology specialist workup.    Hyperlipidemia 06/14/2012   Seasonal allergies 06/14/2012    Past Surgical History:  Procedure Laterality Date   APPENDECTOMY     BICEPT TENODESIS Right 10/10/2019   Procedure: BICEPS TENODESIS;  Surgeon: Hiram Gash, MD;  Location: [facility];  Service: Orthopedics;  Laterality: Right;   bladder sling/mesh     INGUINAL HERNIA REPAIR     intrauterine ablation     NASAL SINUS SURGERY     TUBAL LIGATION      There were no vitals filed for this visit.   Subjective Assessment - 03/09/20 1610    Subjective Pt's FOTO improved to 39% limited. Rt shoulder with 4/5 strength. pt states she can do "some things" and cannot do other things                             [facility] Adult PT Treatment/Exercise - 03/09/20 0001      Shoulder Exercises:  Seated   Other Seated Exercises Ws red TB 2 x 10      Shoulder Exercises: Standing   Flexion Strengthening;Right;20 reps    Shoulder Flexion Weight (lbs) 2    ABduction Strengthening;Right;20 reps    Shoulder ABduction Weight (lbs) 2    Extension Strengthening;Right;20 reps    Row Strengthening;Right;20 reps    Theraband Level (Shoulder Row) Level 2 (Red)    Other Standing Exercises scaption 2 x 10 2# Rt                  PT Education - 03/09/20 1629    Education Details HEP, plan for d/c    Person(s) Educated Patient    Methods Explanation;Demonstration;Handout    Comprehension Returned demonstration;Verbalized understanding            PT Short Term Goals - 01/02/20 1807      PT SHORT TERM GOAL #1   Title Patient independent in initial HEP    Time 6    Period Weeks    Status Achieved      PT SHORT TERM GOAL #2   Title Decrease pain and muscle spasms 75-100% allowing patient to return to normal functional activities    Time 6  Period Weeks    Status Achieved      PT SHORT TERM GOAL #3   Title Improve trunk mobilty and core strength allowing patient to return to all normal functional activities    Time 6    Period Weeks    Status Partially Met             PT Long Term Goals - 03/09/20 1608      PT LONG TERM GOAL #1   Title AROM Rt shoulder to WFL's and pain free    Status Partially Met                 Plan - 03/09/20 1634    Clinical Impression Statement Pt continues with limitations in IR ROM and strength in flexion and aabduction. Pt able to demo good form with HEP for stretching and strengthening. Pt has met 2/5 LTG and partially met the remaining 3.    PT Treatment/Interventions Patient/family education;ADLs/Self Care Home Management;Aquatic Therapy;Cryotherapy;Electrical Stimulation;Iontophoresis 42m/ml Dexamethasone;Moist Heat;Ultrasound;Neuromuscular re-education;Therapeutic exercise;Therapeutic activities;Manual techniques;Dry  needling;Passive range of motion;Taping    PT Next Visit Plan Pt discharged [facility] with Plan of Care Patient           Patient will benefit from skilled therapeutic intervention in order to improve the following deficits and impairments:  Decreased range of motion,Increased muscle spasms,Impaired UE functional use,Decreased activity tolerance,Pain,Impaired flexibility,Improper body mechanics,Decreased mobility,Decreased strength,Postural dysfunction  Visit Diagnosis: Muscle weakness (generalized)  Acute pain of right shoulder  Abnormal posture  Other symptoms and signs involving the musculoskeletal system  Thoracolumbar back pain     Problem List Patient Active Problem List   Diagnosis Date Noted   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   Hepatic steatosis 11/13/2018   Tobacco use 07/18/2018   Sun-induced skin changes, keratosis 08/08/2017   Subcutaneous cyst 08/08/2017   Irritable bowel syndrome (IBS) 05/04/2017   Generalized anxiety disorder 03/16/2016   Social anxiety disorder 03/16/2016   Major depressive disorder, recurrent episode, moderate ([place] 03/16/2016   Right inguinal hernia 07/14/2015   Hallux valgus 12/09/2014   Acute headache 07/31/2014   Chest pain 07/31/2014   Hypotension 07/31/2014   Other and unspecified ovarian cyst 06/25/2013   Left tennis elbow 06/25/2013   Left breast mass 06/27/2012   Microscopic hematuria 06/27/2012   Hyperlipidemia 06/14/2012   Prediabetes 06/14/2012   History of leukocytosis 06/14/2012   Seasonal allergies 06/14/2012   Depression 06/14/2012   ADD (attention deficit disorder) 06/14/2012   PHYSICAL THERAPY DISCHARGE SUMMARY  Visits from Start of Care: 22  Current functional level related to goals / functional outcomes: Pt has improved FOTO and functional abilities and decreased pain   Remaining deficits: ROM  and strength   Education / Equipment: HEP Plan: Patient agrees to discharge.  Patient goals were partially met. Patient is being discharged due to meeting the stated rehab goals.  ?????    DIsabelle Course PT  D[facility]12/27/2021, 4:37 PM  [facility] [address]Phone: [phone]  Fax:  [phone] Name: CJenniger FigielMRN: [id number] of Birth: [birth year]

## 2020-03-10 ENCOUNTER — Encounter (HOSPITAL_BASED_OUTPATIENT_CLINIC_OR_DEPARTMENT_OTHER): Payer: Self-pay | Admitting: Emergency Medicine

## 2020-03-10 ENCOUNTER — Other Ambulatory Visit: Payer: Self-pay

## 2020-03-10 DIAGNOSIS — I10 Essential (primary) hypertension: Secondary | ICD-10-CM | POA: Insufficient documentation

## 2020-03-10 DIAGNOSIS — F17210 Nicotine dependence, cigarettes, uncomplicated: Secondary | ICD-10-CM | POA: Diagnosis not present

## 2020-03-10 LAB — BASIC METABOLIC PANEL
Anion gap: 10 (ref 5–15)
BUN: 10 mg/dL (ref 6–20)
CO2: 23 mmol/L (ref 22–32)
Calcium: 8.5 mg/dL — ABNORMAL LOW (ref 8.9–10.3)
Chloride: 104 mmol/L (ref 98–111)
Creatinine, Ser: 0.84 mg/dL (ref 0.44–1.00)
GFR, Estimated: >60 mL/min (ref >60)
Glucose, Bld: 96 mg/dL (ref 70–99)
Potassium: 3.2 mmol/L — ABNORMAL LOW (ref 3.5–5.1)
Sodium: 137 mmol/L (ref 135–145)

## 2020-03-10 LAB — TROPONIN I (HIGH SENSITIVITY)
Troponin I (High Sensitivity): 4 ng/L (ref <18)
Troponin I (High Sensitivity): 4 ng/L (ref <18)

## 2020-03-10 LAB — CBC
HCT: 44.7 % (ref 36.0–46.0)
Hemoglobin: 15.1 g/dL — ABNORMAL HIGH (ref 12.0–15.0)
MCH: 30.3 pg (ref 26.0–34.0)
MCHC: 33.8 g/dL (ref 30.0–36.0)
MCV: 89.6 fL (ref 80.0–100.0)
Platelets: 352 10*3/uL (ref 150–400)
RBC: 4.99 MIL/uL (ref 3.87–5.11)
RDW: 12.5 % (ref 11.5–15.5)
WBC: 16.6 10*3/uL — ABNORMAL HIGH (ref 4.0–10.5)
nRBC: 0 % (ref 0.0–0.2)

## 2020-03-10 LAB — PREGNANCY, URINE: Preg Test, Ur: NEGATIVE

## 2020-03-10 NOTE — ED Triage Notes (Addendum)
Pt reports BP 206/112 at home, pt reports "chest tightness" and headache; pt says "it's hard to catch my breath"; recently put on BP med hydrochlorothiazide taken at 0830; pt COVID+ 12/5 but had been vaccinated and was asymptomatic; pt ambulatory and shows no signs of stroke

## 2020-03-11 ENCOUNTER — Emergency Department (HOSPITAL_BASED_OUTPATIENT_CLINIC_OR_DEPARTMENT_OTHER): Payer: 59

## 2020-03-11 ENCOUNTER — Encounter: Payer: Self-pay | Admitting: Osteopathic Medicine

## 2020-03-11 ENCOUNTER — Ambulatory Visit (INDEPENDENT_AMBULATORY_CARE_PROVIDER_SITE_OTHER): Payer: 59 | Admitting: Osteopathic Medicine

## 2020-03-11 ENCOUNTER — Emergency Department (HOSPITAL_BASED_OUTPATIENT_CLINIC_OR_DEPARTMENT_OTHER)
Admission: EM | Admit: 2020-03-11 | Discharge: 2020-03-11 | Disposition: A | Discharge status: Home / Self Care | Payer: 59 | Attending: Emergency Medicine | Admitting: Emergency Medicine

## 2020-03-11 VITALS — BP 162/99 | HR 75 | Temp 98.3°F | Wt 211.2 lb

## 2020-03-11 DIAGNOSIS — I160 Hypertensive urgency: Secondary | ICD-10-CM | POA: Diagnosis not present

## 2020-03-11 DIAGNOSIS — R072 Precordial pain: Secondary | ICD-10-CM

## 2020-03-11 DIAGNOSIS — I10 Essential (primary) hypertension: Secondary | ICD-10-CM

## 2020-03-11 MED ORDER — ENALAPRIL MALEATE 10 MG PO TABS: ORAL_TABLET | ORAL | 0 refills | Status: DC

## 2020-03-11 MED ORDER — CLONIDINE HCL 0.1 MG PO TABS: 0.1000 mg | ORAL_TABLET | Freq: Three times a day (TID) | ORAL | 0 refills | Status: DC | PRN

## 2020-03-11 NOTE — Progress Notes (Signed)
Sarah Nicholson is a 49 y.o. female who presents to  [facility]  today, 03/11/20, seeking care for the following:  . Significant high BP at ER last night associated w/ headache, other w/u negative. Today BP a bit better. ER made no changes to Rx. Reviewed labs and imaging      ASSESSMENT & PLAN with other pertinent findings:  The encounter diagnosis was Hypertensive urgency.   No results found for this or any previous visit (from the past 24 hour(s)).     Patient Instructions  Plan:   Continue HCTZ 12.5 mg twice daily  Adding Enalapril 5 mg twice daily for one day (today PM and tomorrow AM) then 10 mg twice daily. Can reduce back to 5 mg twice daily or 10 mg once daily if BP <110 systolic (top number)  Also adding Clonidine 0.1 mg to take as needed if BP is up >160 systolic    Orders Placed This Encounter  Procedures  . COMPLETE METABOLIC PANEL WITH GFR  . CBC with Differential/Platelet  . TSH    Meds ordered this encounter  Medications  . enalapril (VASOTEC) 10 MG tablet    Sig: Take 0.5 tablets (5 mg total) by mouth 2 (two) times daily for 1 day, THEN 1 tablet (10 mg total) 2 (two) times daily.    Dispense:  60 tablet    Refill:  0  . cloNIDine (CATAPRES) 0.1 MG tablet    Sig: Take 1 tablet (0.1 mg total) by mouth 3 (three) times daily as needed (systolic blood pressure >160).    Dispense:  90 tablet    Refill:  0       Follow-up instructions: Return in about 1 week (around 03/18/2020) for RECHECK BLOOD PRESSURE w/ JOY OR DR MATTHEWS .                                         BP (!) 162/99   Pulse 75   Temp 98.3 F (36.8 C)   Wt 211 lb 3.2 oz (95.8 kg)   SpO2 96%   BMI 34.09 kg/m   Current Meds  Medication Sig  . clonazePAM (KLONOPIN) 0.5 MG tablet TAKE 1 TABLET(0.5 MG) BY MOUTH THREE TIMES DAILY AS NEEDED FOR ANXIETY  . cloNIDine (CATAPRES) 0.1 MG  tablet Take 1 tablet (0.1 mg total) by mouth 3 (three) times daily as needed (systolic blood pressure >160).  . enalapril (VASOTEC) 10 MG tablet Take 0.5 tablets (5 mg total) by mouth 2 (two) times daily for 1 day, THEN 1 tablet (10 mg total) 2 (two) times daily.  [name] EPINEPHRINE 0.3 mg/0.3 mL IJ SOAJ injection INJECT INTRAMUSCULARLY AS DIRECTED  . escitalopram (LEXAPRO) 20 MG tablet Take 2 tablets (40 mg total) by mouth daily.  . hydrochlorothiazide (HYDRODIURIL) 12.5 MG tablet Take 1 tablet (12.5 mg total) by mouth daily.  [name] tiZANidine (ZANAFLEX) 4 MG tablet Take 1 tablet (4 mg total) by mouth every 6 (six) hours as needed for muscle spasms.    Results for orders placed or performed during the hospital encounter of 03/11/20 (from the past 72 hour(s))  Basic metabolic panel     Status: Abnormal   Collection Time: 03/10/20  8:15 PM  Result Value Ref Range   Sodium 137 135 - 145 mmol/L   Potassium 3.2 (L) 3.5 -  5.1 mmol/L   Chloride 104 98 - 111 mmol/L   CO2 23 22 - 32 mmol/L   Glucose, Bld 96 70 - 99 mg/dL    Comment: Glucose reference range applies only to samples taken after fasting for at least 8 hours.   BUN 10 6 - 20 mg/dL   Creatinine, Ser 9.92 0.44 - 1.00 mg/dL   Calcium 8.5 (L) 8.9 - 10.3 mg/dL   GFR, Estimated >42 >68 mL/min    Comment: (NOTE) Calculated using the CKD-EPI Creatinine Equation (2021)    Anion gap 10 5 - 15    Comment: Performed at [facility], [address] [place], [place] [address]  CBC     Status: Abnormal   Collection Time: 03/10/20  8:15 PM  Result Value Ref Range   WBC 16.6 (H) 4.0 - 10.5 K/uL   RBC 4.99 3.87 - 5.11 MIL/uL   Hemoglobin 15.1 (H) 12.0 - 15.0 g/dL   HCT 22.2 97.9 - 89.2 %   MCV 89.6 80.0 - 100.0 fL   MCH 30.3 26.0 - 34.0 pg   MCHC 33.8 30.0 - 36.0 g/dL   RDW 11.9 41.7 - 40.8 %   Platelets 352 150 - 400 K/uL   nRBC 0.0 0.0 - 0.2 %    Comment: Performed at [facility], [address] [place], [place] [address]   Troponin I (High Sensitivity)     Status: None   Collection Time: 03/10/20  8:15 PM  Result Value Ref Range   Troponin I (High Sensitivity) 4 <18 ng/L    Comment: (NOTE) Elevated high sensitivity troponin I (hsTnI) values and significant  changes across serial measurements may suggest ACS but many other  chronic and acute conditions are known to elevate hsTnI results.  Refer to the "Links" section for chest pain algorithms and additional  guidance. Performed at [facility], [address] [place], [place] [address]   Pregnancy, urine     Status: None   Collection Time: 03/10/20  8:15 PM  Result Value Ref Range   Preg Test, Ur NEGATIVE NEGATIVE    Comment:        THE SENSITIVITY OF THIS METHODOLOGY IS >20 mIU/mL. Performed at [facility], [address] [place], [place] [address]   Troponin I (High Sensitivity)     Status: None   Collection Time: 03/10/20 10:18 PM  Result Value Ref Range   Troponin I (High Sensitivity) 4 <18 ng/L    Comment: (NOTE) Elevated high sensitivity troponin I (hsTnI) values and significant  changes across serial measurements may suggest ACS but many other  chronic and acute conditions are known to elevate hsTnI results.  Refer to the "Links" section for chest pain algorithms and additional  guidance. Performed at [facility], [address], [place], [place] [address]     DG Chest 2 View  Result Date: 03/11/2020 CLINICAL DATA:  Chest pain EXAM: CHEST - 2 VIEW COMPARISON:  None. FINDINGS: The heart size and mediastinal contours are within normal limits. Both lungs are clear. The visualized skeletal structures are unremarkable. IMPRESSION: No active cardiopulmonary disease. Electronically Signed   By: Jonna Clark M.D.   On: 03/11/2020 00:20       All questions at time of visit were answered - patient instructed to contact office with any additional concerns or updates.  ER/RTC precautions were reviewed with  the patient as applicable.  Please note: voice recognition software was used to produce this document, and typos may escape review. Please contact Dr. Sheppard Coil for any needed clarifications.   [name]

## 2020-03-11 NOTE — Discharge Instructions (Signed)
You were seen in the emergency room today with chest discomfort and elevated blood pressure.  Your heart enzymes and other lab work is normal.  Please follow closely with your primary care doctor to review your blood pressures and make any medication changes as needed.  Return to the emergency department for worsening pain, headache, weakness/numbness, or other sudden severe symptoms.

## 2020-03-11 NOTE — ED Provider Notes (Signed)
Emergency Department Provider Note   I have reviewed the triage vital signs and the nursing notes.   HISTORY  Chief Complaint Hypertension and Chest Pain   HPI Sarah Nicholson Easton Sivertson is a 49 y.o. female with PMH reviewed below presents to the ED with elevated blood pressure at home with headache, shortness of breath, and chest tightness.  Patient recovered from COVID-19 earlier in the month.  She was asymptomatic and had been vaccinated.  She noticed blood pressure initially elevated despite taking her home medications.  She denies any unilateral weakness or numbness.  Her headache is generalized and more moderate in severity.  She had a tightness in her chest which is since resolved and was feeling some shortness of breath which is also resolved.     Past Medical History:  Diagnosis Date  . ADD (attention deficit disorder) 06/14/2012  . Anxiety   . Bronchitis   . Complication of anesthesia 1987   states woke up during appedentomy  . Depression 06/14/2012  . Hepatic steatosis 11/13/2018   Hepatic steatosis with hepatomegaly seen on MRI abd on 11/2018  . History of leukocytosis 06/14/2012   Reports "WBC 32" decades ago with negative hematology specialist workup.   [name] Hyperlipidemia 06/14/2012  . Seasonal allergies 06/14/2012    Patient Active Problem List   Diagnosis Date Noted  . 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 Hepatic steatosis 11/13/2018  . Tobacco use 07/18/2018  . Sun-induced skin changes, keratosis 08/08/2017  . Subcutaneous cyst 08/08/2017  . Irritable bowel syndrome (IBS) 05/04/2017  . Generalized anxiety disorder 03/16/2016  . Social anxiety disorder 03/16/2016  . Major depressive disorder, recurrent episode, moderate (HCC) 03/16/2016  . Right inguinal hernia 07/14/2015  . Hallux valgus 12/09/2014  . Acute headache 07/31/2014  . Chest pain 07/31/2014  . Hypotension 07/31/2014  . Other and unspecified ovarian cyst 06/25/2013  . Left tennis  elbow 06/25/2013  . Left breast mass 06/27/2012  . Microscopic hematuria 06/27/2012  . Hyperlipidemia 06/14/2012  . Prediabetes 06/14/2012  . History of leukocytosis 06/14/2012  . Seasonal allergies 06/14/2012  . Depression 06/14/2012  . ADD (attention deficit disorder) 06/14/2012    Past Surgical History:  Procedure Laterality Date  . APPENDECTOMY    . BICEPT TENODESIS Right 10/10/2019   Procedure: BICEPS TENODESIS;  Surgeon: Bjorn Pippin, MD;  Location: [facility];  Service: Orthopedics;  Laterality: Right;  . bladder sling/mesh    . INGUINAL HERNIA REPAIR    . intrauterine ablation    . NASAL SINUS SURGERY    . TUBAL LIGATION      Allergies Nitrofurantoin, Other, Tetanus toxoids, Macrobid [nitrofurantoin macrocrystal], and Thimerosal  Family History  Problem Relation Age of Onset  . Depression Mother   . Diabetes Father   . Hyperlipidemia Father   . Hypertension Father   . Depression Sister   . Depression Son   . Breast cancer Maternal Grandmother     Social History Social History   Tobacco Use  . Smoking status: Current Every Day Smoker    Packs/day: 0.50    Types: Cigarettes  . Smokeless tobacco: Never Used  . Tobacco comment: 10-20  Vaping Use  . Vaping Use: Never used  Substance Use Topics  . Alcohol use: Yes    Comment: 1-2 drinks every 6 months  . Drug use: No    Review of Systems  Constitutional: No fever/chills Eyes: No visual changes. ENT: No sore throat. Cardiovascular: Positive chest pain  w/ elevated BP.  Respiratory: Positive shortness of breath. Gastrointestinal: No abdominal pain.  No nausea, no vomiting.  No diarrhea.  No constipation. Genitourinary: Negative for dysuria. Musculoskeletal: Negative for back pain. Skin: Negative for rash. Neurological: Negative for focal weakness or numbness. Positive HA.   10-point ROS otherwise negative.  ____________________________________________   PHYSICAL EXAM:  VITAL  SIGNS: ED Triage Vitals  Enc Vitals Group     BP 03/10/20 1956 (!) 195/78     Pulse Rate 03/10/20 1956 79     Resp 03/10/20 1956 18     Temp 03/10/20 1956 98.6 F (37 C)     Temp Source 03/10/20 1956 Oral     SpO2 03/10/20 1956 97 %     Weight 03/10/20 1957 200 lb (90.7 kg)     Height 03/10/20 1957 5\' 6"  (1.676 m)   Constitutional: Alert and oriented. Well appearing and in no acute distress. Eyes: Conjunctivae are normal.  Head: Atraumatic. Nose: No congestion/rhinnorhea. Mouth/Throat: Mucous membranes are moist.  Neck: No stridor.   Cardiovascular: Normal rate, regular rhythm. Good peripheral circulation. Grossly normal heart sounds.   Respiratory: Normal respiratory effort.  No retractions. Lungs CTAB. Gastrointestinal: Soft and nontender. No distention.  Musculoskeletal: No lower extremity tenderness nor edema. No gross deformities of extremities. Neurologic:  Normal speech and language. No gross focal neurologic deficits are appreciated.  Skin:  Skin is warm, dry and intact. No rash noted.  ____________________________________________   LABS (all labs ordered are listed, but only abnormal results are displayed)  Labs Reviewed  BASIC METABOLIC PANEL - Abnormal; Notable for the following components:      Result Value   Potassium 3.2 (*)    Calcium 8.5 (*)    All other components within normal limits  CBC - Abnormal; Notable for the following components:   WBC 16.6 (*)    Hemoglobin 15.1 (*)    All other components within normal limits  PREGNANCY, URINE  TROPONIN I (HIGH SENSITIVITY)  TROPONIN I (HIGH SENSITIVITY)   ____________________________________________  EKG   EKG Interpretation  Date/Time:  Tuesday March 10 2020 19:51:28 EST Ventricular Rate:  81 PR Interval:  150 QRS Duration: 90 QT Interval:  348 QTC Calculation: 404 R Axis:   59 Text Interpretation: Normal sinus rhythm Right atrial enlargement Nonspecific ST and T wave abnormality Abnormal ECG  No STEMI Confirmed by Nanda Quinton [phone]) on 03/11/2020 1:17:36 AM       ____________________________________________  RADIOLOGY  DG Chest 2 View  Result Date: 03/11/2020 CLINICAL DATA:  Chest pain EXAM: CHEST - 2 VIEW COMPARISON:  None. FINDINGS: The heart size and mediastinal contours are within normal limits. Both lungs are clear. The visualized skeletal structures are unremarkable. IMPRESSION: No active cardiopulmonary disease. Electronically Signed   By: Prudencio Pair M.D.   On: 03/11/2020 00:20    ____________________________________________   PROCEDURES  Procedure(s) performed:   Procedures  None  ____________________________________________   INITIAL IMPRESSION / ASSESSMENT AND PLAN / ED COURSE  Pertinent labs & imaging results that were available during my care of the patient were reviewed by me and considered in my medical decision making (see chart for details).   Patient presents emergency department with elevated blood pressure, headache, chest discomfort, shortness of breath.  Symptoms have mostly resolved the time of my evaluation.  She is not hypoxemic.  Doubt ACS/PE clinically.  Troponins negative x2.  Chest x-ray reviewed independently by me with no acute findings.  EKG interpreted by me as  above with no acute findings.  No sign to suspect hypertensive emergency.  Patient to follow with her PCP later today for blood pressure medication adjustment.  BP here has normalized without intervention.    ____________________________________________  FINAL CLINICAL IMPRESSION(S) / ED DIAGNOSES  Final diagnoses:  Precordial chest pain  Hypertension, unspecified type    Note:  This document was prepared using Dragon voice recognition software and may include unintentional dictation errors.  Nanda Quinton, MD, [facility] Emergency Medicine    Zaidyn Claire, [facility], MD 03/17/20 [phone]

## 2020-03-11 NOTE — Patient Instructions (Addendum)
Plan:   Continue HCTZ 12.5 mg twice daily  Adding Enalapril 5 mg twice daily for one day (today PM and tomorrow AM) then 10 mg twice daily. Can reduce back to 5 mg twice daily or 10 mg once daily if BP <110 systolic (top number)  Also adding Clonidine 0.1 mg to take as needed if BP is up >160 systolic

## 2020-03-17 LAB — CBC WITH DIFFERENTIAL/PLATELET
Absolute Monocytes: 956 cells/uL — ABNORMAL HIGH (ref 200–950)
Basophils Absolute: 52 cells/uL (ref 0–200)
Basophils Relative: 0.4 %
Eosinophils Absolute: 105 cells/uL (ref 15–500)
Eosinophils Relative: 0.8 %
HCT: 48.3 % — ABNORMAL HIGH (ref 35.0–45.0)
Hemoglobin: 16.4 g/dL — ABNORMAL HIGH (ref 11.7–15.5)
Lymphs Abs: 3694 cells/uL (ref 850–3900)
MCH: 30 pg (ref 27.0–33.0)
MCHC: 34 g/dL (ref 32.0–36.0)
MCV: 88.3 fL (ref 80.0–100.0)
MPV: 9.4 fL (ref 7.5–12.5)
Monocytes Relative: 7.3 %
Neutro Abs: 8292 cells/uL — ABNORMAL HIGH (ref 1500–7800)
Neutrophils Relative %: 63.3 %
Platelets: 432 10*3/uL — ABNORMAL HIGH (ref 140–400)
RBC: 5.47 10*6/uL — ABNORMAL HIGH (ref 3.80–5.10)
RDW: 12.5 % (ref 11.0–15.0)
Total Lymphocyte: 28.2 %
WBC: 13.1 10*3/uL — ABNORMAL HIGH (ref 3.8–10.8)

## 2020-03-17 LAB — COMPLETE METABOLIC PANEL WITH GFR
AG Ratio: 1.4 (calc) (ref 1.0–2.5)
ALT: 12 U/L (ref 6–29)
AST: 11 U/L (ref 10–35)
Albumin: 4 g/dL (ref 3.6–5.1)
Alkaline phosphatase (APISO): 78 U/L (ref 37–153)
BUN: 12 mg/dL (ref 7–25)
CO2: 27 mmol/L (ref 20–32)
Calcium: 9.2 mg/dL (ref 8.6–10.4)
Chloride: 104 mmol/L (ref 98–110)
Creat: 0.98 mg/dL (ref 0.50–1.05)
GFR, Est African American: 77 mL/min/{1.73_m2} (ref >=60)
GFR, Est Non African American: 67 mL/min/{1.73_m2} (ref >=60)
Globulin: 2.9 g/dL (calc) (ref 1.9–3.7)
Glucose, Bld: 97 mg/dL (ref 65–99)
Potassium: 4.4 mmol/L (ref 3.5–5.3)
Sodium: 137 mmol/L (ref 135–146)
Total Bilirubin: 0.4 mg/dL (ref 0.2–1.2)
Total Protein: 6.9 g/dL (ref 6.1–8.1)

## 2020-03-17 LAB — TSH: TSH: 2.93 mIU/L

## 2020-03-18 ENCOUNTER — Ambulatory Visit (INDEPENDENT_AMBULATORY_CARE_PROVIDER_SITE_OTHER): Payer: 59 | Admitting: Medical-Surgical

## 2020-03-18 ENCOUNTER — Other Ambulatory Visit: Payer: Self-pay

## 2020-03-18 ENCOUNTER — Encounter: Payer: Self-pay | Admitting: Medical-Surgical

## 2020-03-18 VITALS — BP 105/70 | HR 75 | Temp 98.5°F | Ht 66.0 in | Wt 208.4 lb

## 2020-03-18 DIAGNOSIS — I10 Essential (primary) hypertension: Secondary | ICD-10-CM | POA: Diagnosis not present

## 2020-03-18 NOTE — Progress Notes (Signed)
Subjective:    CC: Hypertension follow-up  HPI: Pleasant 50 year old female presenting for 1 week follow-up after being diagnosed with hypertensive urgency.  Notes that on Monday she did note that her heart rate became elevated and did not drop below 96.  She did have a few episodes of lightheadedness as well as a headache on Monday but that has completely resolved.  Notes that she does get short of breath at times when lying down/at rest and she has some chest tightness when she lays down flat.  She is taking enalapril 10 mg twice daily and has not had to use clonidine.  Tolerating her medication well without side effects.  Blood pressure very well controlled today.  Wonders if her recent Covid infection may have contributed to her blood pressure issues.  Has questions about her lab results.  I reviewed the past medical history, family history, social history, surgical history, and allergies today and no changes were needed.  Please see the problem list section below in epic for further details.  Past Medical History: Past Medical History:  Diagnosis Date  . ADD (attention deficit disorder) 06/14/2012  . Anxiety   . Bronchitis   . Complication of anesthesia 1987   states woke up during appedentomy  . Depression 06/14/2012  . Hepatic steatosis 11/13/2018   Hepatic steatosis with hepatomegaly seen on MRI abd on 11/2018  . History of leukocytosis 06/14/2012   Reports "WBC 32" decades ago with negative hematology specialist workup.   [name] Hyperlipidemia 06/14/2012  . Seasonal allergies 06/14/2012   Past Surgical History: Past Surgical History:  Procedure Laterality Date  . APPENDECTOMY    . BICEPT TENODESIS Right 10/10/2019   Procedure: BICEPS TENODESIS;  Surgeon: Bjorn Pippin, MD;  Location: [facility];  Service: Orthopedics;  Laterality: Right;  . bladder sling/mesh    . INGUINAL HERNIA REPAIR    . intrauterine ablation    . NASAL SINUS SURGERY    . TUBAL LIGATION     Social  History: Social History   Socioeconomic History  . Marital status: Married    Spouse name: Not on file  . Number of children: Not on file  . Years of education: Not on file  . Highest education level: Not on file  Occupational History  . Not on file  Tobacco Use  . Smoking status: Current Every Day Smoker    Packs/day: 0.50    Types: Cigarettes  . Smokeless tobacco: Never Used  . Tobacco comment: 10-20  Vaping Use  . Vaping Use: Never used  Substance and Sexual Activity  . Alcohol use: Yes    Comment: 1-2 drinks every 6 months  . Drug use: No  . Sexual activity: Not on file  Other Topics Concern  . Not on file  Social History Narrative  . Not on file   Social Determinants of Health   Financial Resource Strain: Not on file  Food Insecurity: Not on file  Transportation Needs: Not on file  Physical Activity: Not on file  Stress: Not on file  Social Connections: Not on file   Family History: Family History  Problem Relation Age of Onset  . Depression Mother   . Diabetes Father   . Hyperlipidemia Father   . Hypertension Father   . Depression Sister   . Depression Son   . Breast cancer Maternal Grandmother    Allergies: Allergies  Allergen Reactions  . Macrobid [Nitrofurantoin Macrocrystal] Hives  . Nitrofurantoin Hives and Rash  .  Other Hives    Preservative used in vaccinations  . Tetanus Toxoids Swelling    Pt states she was told the Tdap injection would have to be administered in the hospital  After discussion on 11/10 pt remembers being told it was thimersol in the tetanus 30+years ago that caused the swelling.    . Thimerosal Rash   Medications: See med rec.  Review of Systems: See HPI for pertinent positives and negatives.   Objective:    General: Well Developed, well nourished, and in no acute distress.  Neuro: Alert and oriented x3.  HEENT: Normocephalic, atraumatic.  Skin: Warm and dry. Cardiac: Regular rate and rhythm.  Respiratory: Not  using accessory muscles, speaking in full sentences.  Impression and Recommendations:    1. Hypertension, unspecified type Very well controlled on enalapril 10 mg twice daily.  Advised patient to continue to monitor blood pressure and if she goes any lower than her current reading of 105/70 today or she becomes symptomatic/orthostatic, she may need to reduce her enalapril to 5 mg twice daily.  Reviewed lab results with patient and discussed the changes noted on her CBC.  Return if symptoms worsen or fail to improve. ___________________________________________ Thayer Ohm, DNP, APRN, FNP-BC Primary Care and Sports Medicine [facility] [place]

## 2020-03-25 ENCOUNTER — Telehealth (INDEPENDENT_AMBULATORY_CARE_PROVIDER_SITE_OTHER): Payer: 59 | Admitting: Osteopathic Medicine

## 2020-03-25 ENCOUNTER — Encounter: Payer: Self-pay | Admitting: Osteopathic Medicine

## 2020-03-25 VITALS — BP 132/64 | HR 79 | Temp 96.6°F | Wt 208.0 lb

## 2020-03-25 DIAGNOSIS — J069 Acute upper respiratory infection, unspecified: Secondary | ICD-10-CM | POA: Diagnosis not present

## 2020-03-25 DIAGNOSIS — J039 Acute tonsillitis, unspecified: Secondary | ICD-10-CM

## 2020-03-25 MED ORDER — AMOXICILLIN-POT CLAVULANATE 875-125 MG PO TABS: 1.0000 | ORAL_TABLET | Freq: Two times a day (BID) | ORAL | 0 refills | Status: AC

## 2020-03-25 MED ORDER — LIDOCAINE VISCOUS HCL 2 % MT SOLN: 10.0000 mL | OROMUCOSAL | 1 refills | Status: DC | PRN

## 2020-03-25 NOTE — Progress Notes (Signed)
Telemedicine Visit via  Video & Audio (App used: MyChart)  I connected with Sarah Nicholson on 03/25/20 at 11:46 AM  by phone or  telemedicine application as noted above  I verified that I am speaking with or regarding  the correct patient using two identifiers.  Participants: Myself, Dr Emeterio Reeve DO Patient: Sarah Nicholson  Patient is at work I am in office at [facility]    I discussed the limitations of evaluation and management  by telemedicine and the availability of in person appointments.  The participant(s) above expressed understanding and  agreed to proceed with this appointment via telemedicine.       History of Present Illness: Sarah Nicholson is a 50 y.o. female who would like to discuss sore throat    Symptom onset yesterday  Cepacol tried, some relief (+)COVID last month  Vaccinated  Mild cough No fever      Observations/Objective: BP 132/64   Pulse 79   Temp (!) 96.6 F (35.9 C)   Wt 208 lb (94.3 kg)   BMI 33.57 kg/m  BP Readings from Last 3 Encounters:  03/25/20 132/64  03/18/20 105/70  03/11/20 (!) 162/99   Exam: Normal Speech.    Lab and Radiology Results No results found for this or any previous visit (from the past 72 hour(s)). No results found.     Assessment and Plan: 49 y.o. female with The primary encounter diagnosis was Tonsillitis. A diagnosis of Viral URI was also pertinent to this visit.  Patient advised to undergo testing for COVID/strep, certainly other viral causes are possible, COVID seems unlikely given vaccination/recent infection but given overlap of delta/omicron I would have to do some research on reinfection/natural immunity.  Patient was advised at any rate to isolate for 5 to 10 days from onset of symptoms, okay to return to work 5 days after onset of symptoms if feeling better and if COVID testing negative, advised surgical/N95 mask used at all times outside of the home and  do not socialize.  Patient advised if significant worsening throat pain, particularly difficulty speaking/swallowing, drooling, trismus, emergency evaluation would be warranted.  PDMP not reviewed this encounter. No orders of the defined types were placed in this encounter.  Meds ordered this encounter  Medications  . lidocaine (XYLOCAINE) 2 % solution    Sig: Use as directed 10 mLs in the mouth or throat every 3 (three) hours as needed (mouth/throat pain).    Dispense:  100 mL    Refill:  1  . amoxicillin-clavulanate (AUGMENTIN) 875-125 MG tablet    Sig: Take 1 tablet by mouth 2 (two) times daily for 10 days.    Dispense:  20 tablet    Refill:  0   There are no Patient Instructions on file for this visit.  Instructions sent via MyChart.   Follow Up Instructions: Return if symptoms worsen or fail to improve.    I discussed the assessment and treatment plan with the patient. The patient was provided an opportunity to ask questions and all were answered. The patient agreed with the plan and demonstrated an understanding of the instructions.   The patient was advised to call back or seek an in-person evaluation if any new concerns, if symptoms worsen or if the condition fails to improve as anticipated.  30 minutes of non-face-to-face time was provided during this encounter.      . . . . . . . . . . . . . Sarah Nicholson  Historical information moved to improve visibility of documentation.  Past Medical History:  Diagnosis Date  . ADD (attention deficit disorder) 06/14/2012  . Anxiety   . Bronchitis   . Complication of anesthesia 1987   states woke up during appedentomy  . Depression 06/14/2012  . Hepatic steatosis 11/13/2018   Hepatic steatosis with hepatomegaly seen on MRI abd on 11/2018  . History of leukocytosis 06/14/2012   Reports "WBC 32" decades ago with negative hematology specialist workup.   Sarah Nicholson Hyperlipidemia 06/14/2012  . Seasonal  allergies 06/14/2012   Past Surgical History:  Procedure Laterality Date  . APPENDECTOMY    . BICEPT TENODESIS Right 10/10/2019   Procedure: BICEPS TENODESIS;  Surgeon: Hiram Gash, MD;  Location: [facility];  Service: Orthopedics;  Laterality: Right;  . bladder sling/mesh    . INGUINAL HERNIA REPAIR    . intrauterine ablation    . NASAL SINUS SURGERY    . TUBAL LIGATION     Social History   Tobacco Use  . Smoking status: Current Every Day Smoker    Packs/day: 0.50    Types: Cigarettes  . Smokeless tobacco: Never Used  . Tobacco comment: 10-20  Substance Use Topics  . Alcohol use: Yes    Comment: 1-2 drinks every 6 months   family history includes Breast cancer in her maternal grandmother; Depression in her mother, sister, and son; Diabetes in her father; Hyperlipidemia in her father; Hypertension in her father.  Medications: Current Outpatient Medications  Medication Sig Dispense Refill  . amoxicillin-clavulanate (AUGMENTIN) 875-125 MG tablet Take 1 tablet by mouth 2 (two) times daily for 10 days. 20 tablet 0  . clonazePAM (KLONOPIN) 0.5 MG tablet TAKE 1 TABLET(0.5 MG) BY MOUTH THREE TIMES DAILY AS NEEDED FOR ANXIETY 90 tablet 0  . cloNIDine (CATAPRES) 0.1 MG tablet Take 1 tablet (0.1 mg total) by mouth 3 (three) times daily as needed (systolic blood pressure >341). 90 tablet 0  . enalapril (VASOTEC) 10 MG tablet Take 0.5 tablets (5 mg total) by mouth 2 (two) times daily for 1 day, THEN 1 tablet (10 mg total) 2 (two) times daily. 60 tablet 0  . EPINEPHRINE 0.3 mg/0.3 mL IJ SOAJ injection INJECT INTRAMUSCULARLY AS DIRECTED 2 Device 99  . escitalopram (LEXAPRO) 20 MG tablet Take 2 tablets (40 mg total) by mouth daily. 180 tablet 3  . hydrochlorothiazide (HYDRODIURIL) 12.5 MG tablet Take 1 tablet (12.5 mg total) by mouth daily. 30 tablet 3  . lidocaine (XYLOCAINE) 2 % solution Use as directed 10 mLs in the mouth or throat every 3 (three) hours as needed (mouth/throat  pain). 100 mL 1  . tiZANidine (ZANAFLEX) 4 MG tablet Take 1 tablet (4 mg total) by mouth every 6 (six) hours as needed for muscle spasms. 60 tablet 0  . amphetamine-dextroamphetamine (ADDERALL) 10 MG tablet Take 1 tablet (10 mg total) by mouth 2 (two) times daily. (Patient not taking: Reported on 03/18/2020) 60 tablet 0   No current facility-administered medications for this visit.   Allergies  Allergen Reactions  . Macrobid [Nitrofurantoin Macrocrystal] Hives  . Nitrofurantoin Hives and Rash  . Other Hives    Preservative used in vaccinations  . Tetanus Toxoids Swelling    Pt states she was told the Tdap injection would have to be administered in the hospital  After discussion on 11/10 pt remembers being told it was thimersol in the tetanus 30+years ago that caused the swelling.    . Thimerosal Rash  If phone visit, billing and coding can please add appropriate modifier if needed

## 2020-03-25 NOTE — Patient Instructions (Signed)
30

## 2020-04-09 ENCOUNTER — Other Ambulatory Visit: Payer: Self-pay | Admitting: Osteopathic Medicine

## 2020-05-14 ENCOUNTER — Ambulatory Visit (INDEPENDENT_AMBULATORY_CARE_PROVIDER_SITE_OTHER): Payer: 59 | Admitting: Osteopathic Medicine

## 2020-05-14 ENCOUNTER — Other Ambulatory Visit: Payer: Self-pay

## 2020-05-14 ENCOUNTER — Encounter: Payer: Self-pay | Admitting: Osteopathic Medicine

## 2020-05-14 VITALS — BP 144/77 | HR 81 | Temp 98.8°F | Resp 20 | Ht 66.0 in | Wt 214.0 lb

## 2020-05-14 DIAGNOSIS — I10 Essential (primary) hypertension: Secondary | ICD-10-CM

## 2020-05-14 NOTE — Patient Instructions (Addendum)
PLAN: HCTZ 21.5 mg twice daily Enalapril 10 mg twice dialy  Recheck via MyChart message next week If BP ok can restart Adderall then        [email or web address]">  DASH Eating Plan DASH stands for Dietary Approaches to Stop Hypertension. The DASH eating plan is a healthy eating plan that has been shown to:  Reduce high blood pressure (hypertension).  Reduce your risk for type 2 diabetes, heart disease, and stroke.  Help with weight loss. What are tips for following this plan? Reading food labels  Check food labels for the amount of salt (sodium) per serving. Choose foods with less than 5 percent of the Daily Value of sodium. Generally, foods with less than 300 milligrams (mg) of sodium per serving fit into this eating plan.  To find whole grains, look for the word "whole" as the first word in the ingredient list. Shopping  Buy products labeled as "low-sodium" or "no salt added."  Buy fresh foods. Avoid canned foods and pre-made or frozen meals. Cooking  Avoid adding salt when cooking. Use salt-free seasonings or herbs instead of table salt or sea salt. Check with your health care provider or pharmacist before using salt substitutes.  Do not fry foods. Cook foods using healthy methods such as baking, boiling, grilling, roasting, and broiling instead.  Cook with heart-healthy oils, such as olive, canola, avocado, soybean, or sunflower oil. Meal planning  Eat a balanced diet that includes: ? 4 or more servings of fruits and 4 or more servings of vegetables each day. Try to fill one-half of your plate with fruits and vegetables. ? 6-8 servings of whole grains each day. ? Less than 6 oz (170 g) of lean meat, poultry, or fish each day. A 3-oz (85-g) serving of meat is about the same size as a deck of cards. One egg equals 1 oz (28 g). ? 2-3 servings of low-fat dairy each day. One serving is 1 cup (237 mL). ? 1 serving of nuts, seeds,  or beans 5 times each week. ? 2-3 servings of heart-healthy fats. Healthy fats called omega-3 fatty acids are found in foods such as walnuts, flaxseeds, fortified milks, and eggs. These fats are also found in cold-water fish, such as sardines, salmon, and mackerel.  Limit how much you eat of: ? Canned or prepackaged foods. ? Food that is high in trans fat, such as some fried foods. ? Food that is high in saturated fat, such as fatty meat. ? Desserts and other sweets, sugary drinks, and other foods with added sugar. ? Full-fat dairy products.  Do not salt foods before eating.  Do not eat more than 4 egg yolks a week.  Try to eat at least 2 vegetarian meals a week.  Eat more home-cooked food and less restaurant, buffet, and fast food.   Lifestyle  When eating at a restaurant, ask that your food be prepared with less salt or no salt, if possible.  If you drink alcohol: ? Limit how much you use to:  0-1 drink a day for women who are not pregnant.  0-2 drinks a day for men. ? Be aware of how much alcohol is in your drink. In the [place] one drink equals one 12 oz bottle of beer (355 mL), one 5 oz glass of wine (148 mL), or one 1 oz glass of hard liquor (44 mL). General information  Avoid eating more than 2,300 mg of salt a day. If you have hypertension, you may need to reduce  your sodium intake to 1,500 mg a day.  Work with your health care provider to maintain a healthy body weight or to lose weight. Ask what an ideal weight is for you.  Get at least 30 minutes of exercise that causes your heart to beat faster (aerobic exercise) most days of the week. Activities may include walking, swimming, or biking.  Work with your health care provider or dietitian to adjust your eating plan to your individual calorie needs. What foods should I eat? Fruits All fresh, dried, or frozen fruit. Canned fruit in natural juice (without added sugar). Vegetables Fresh or frozen vegetables (raw,  steamed, roasted, or grilled). Low-sodium or reduced-sodium tomato and vegetable juice. Low-sodium or reduced-sodium tomato sauce and tomato paste. Low-sodium or reduced-sodium canned vegetables. Grains Whole-grain or whole-wheat bread. Whole-grain or whole-wheat pasta. Brown rice. [place]. Bulgur. Whole-grain and low-sodium cereals. Pita bread. Low-fat, low-sodium crackers. Whole-wheat flour tortillas. Meats and other proteins Skinless chicken or [place]. Ground chicken or [place]. Pork with fat trimmed off. Fish and seafood. Egg whites. Dried beans, peas, or lentils. Unsalted nuts, nut butters, and seeds. Unsalted canned beans. Lean cuts of beef with fat trimmed off. Low-sodium, lean precooked or cured meat, such as sausages or meat loaves. Dairy Low-fat (1%) or fat-free (skim) milk. Reduced-fat, low-fat, or fat-free cheeses. Nonfat, low-sodium ricotta or cottage cheese. Low-fat or nonfat yogurt. Low-fat, low-sodium cheese. Fats and oils Soft margarine without trans fats. Vegetable oil. Reduced-fat, low-fat, or light mayonnaise and salad dressings (reduced-sodium). Canola, safflower, olive, avocado, soybean, and sunflower oils. Avocado. Seasonings and condiments Herbs. Spices. Seasoning mixes without salt. Other foods Unsalted popcorn and pretzels. Fat-free sweets. The items listed above may not be a complete list of foods and beverages you can eat. Contact a dietitian for more information. What foods should I avoid? Fruits Canned fruit in a light or heavy syrup. Fried fruit. Fruit in cream or butter sauce. Vegetables Creamed or fried vegetables. Vegetables in a cheese sauce. Regular canned vegetables (not low-sodium or reduced-sodium). Regular canned tomato sauce and paste (not low-sodium or reduced-sodium). Regular tomato and vegetable juice (not low-sodium or reduced-sodium). Angie Fava. Olives. Grains Baked goods made with fat, such as croissants, muffins, or some breads. Dry pasta or  rice meal packs. Meats and other proteins Fatty cuts of meat. Ribs. Fried meat. Berniece Salines. Bologna, salami, and other precooked or cured meats, such as sausages or meat loaves. Fat from the back of a pig (fatback). Bratwurst. Salted nuts and seeds. Canned beans with added salt. Canned or smoked fish. Whole eggs or egg yolks. Chicken or [place] with skin. Dairy Whole or 2% milk, cream, and half-and-half. Whole or full-fat cream cheese. Whole-fat or sweetened yogurt. Full-fat cheese. Nondairy creamers. Whipped toppings. Processed cheese and cheese spreads. Fats and oils Butter. Stick margarine. Lard. Shortening. Ghee. Bacon fat. Tropical oils, such as coconut, palm kernel, or palm oil. Seasonings and condiments Onion salt, garlic salt, seasoned salt, table salt, and sea salt. Worcestershire sauce. Tartar sauce. Barbecue sauce. Teriyaki sauce. Soy sauce, including reduced-sodium. Steak sauce. Canned and packaged gravies. Fish sauce. Oyster sauce. Cocktail sauce. Store-bought horseradish. Ketchup. Mustard. Meat flavorings and tenderizers. Bouillon cubes. Hot sauces. Pre-made or packaged marinades. Pre-made or packaged taco seasonings. Relishes. Regular salad dressings. Other foods Salted popcorn and pretzels. The items listed above may not be a complete list of foods and beverages you should avoid. Contact a dietitian for more information. Where to find more information  National Heart, Lung, and Blood Institute: https://wilson-eaton.com/  American Heart Association: www.heart.org  Academy of Nutrition and Dietetics: www.eatright.[place]: www.kidney.org Summary  The DASH eating plan is a healthy eating plan that has been shown to reduce high blood pressure (hypertension). It may also reduce your risk for type 2 diabetes, heart disease, and stroke.  When on the DASH eating plan, aim to eat more fresh fruits and vegetables, whole grains, lean proteins, low-fat dairy, and heart-healthy  fats.  With the DASH eating plan, you should limit salt (sodium) intake to 2,300 mg a day. If you have hypertension, you may need to reduce your sodium intake to 1,500 mg a day.  Work with your health care provider or dietitian to adjust your eating plan to your individual calorie needs. This information is not intended to replace advice given to you by your health care provider. Make sure you discuss any questions you have with your health care provider. Document Revised: 02/01/2019 Document Reviewed: 02/01/2019 Elsevier Patient Education  2021 [place] refers to food and lifestyle choices that are based on the traditions of countries located on the [employer]. This way of eating has been shown to help prevent certain conditions and improve outcomes for people who have chronic diseases, like kidney disease and heart disease. What are tips for following this plan? Lifestyle  Cook and eat meals together with your family, when possible.  Drink enough fluid to keep your urine clear or pale yellow.  Be physically active every day. This includes: ? Aerobic exercise like running or swimming. ? Leisure activities like gardening, walking, or housework.  Get 7-8 hours of sleep each night.  If recommended by your health care provider, drink red wine in moderation. This means 1 glass a day for nonpregnant women and 2 glasses a day for men. A glass of wine equals 5 oz (150 mL). Reading food labels  Check the serving size of packaged foods. For foods such as rice and pasta, the serving size refers to the amount of cooked product, not dry.  Check the total fat in packaged foods. Avoid foods that have saturated fat or trans fats.  Check the ingredients list for added sugars, such as corn syrup.   Shopping  At the grocery store, buy most of your food from the areas near the walls of the store. This includes: ? Fresh fruits and  vegetables (produce). ? Grains, beans, nuts, and seeds. Some of these may be available in unpackaged forms or large amounts (in bulk). ? Fresh seafood. ? Poultry and eggs. ? Low-fat dairy products.  Buy whole ingredients instead of prepackaged foods.  Buy fresh fruits and vegetables in-season from local farmers markets.  Buy frozen fruits and vegetables in resealable bags.  If you do not have access to quality fresh seafood, buy precooked frozen shrimp or canned fish, such as tuna, salmon, or sardines.  Buy small amounts of raw or cooked vegetables, salads, or olives from the deli or salad bar at your store.  Stock your pantry so you always have certain foods on hand, such as olive oil, canned tuna, canned tomatoes, rice, pasta, and beans. Cooking  Cook foods with extra-virgin olive oil instead of using butter or other vegetable oils.  Have meat as a side dish, and have vegetables or grains as your main dish. This means having meat in small portions or adding small amounts of meat to foods like pasta or stew.  Use beans or  vegetables instead of meat in common dishes like chili or lasagna.  Experiment with different cooking methods. Try roasting or broiling vegetables instead of steaming or sauteing them.  Add frozen vegetables to soups, stews, pasta, or rice.  Add nuts or seeds for added healthy fat at each meal. You can add these to yogurt, salads, or vegetable dishes.  Marinate fish or vegetables using olive oil, lemon juice, garlic, and fresh herbs. Meal planning  Plan to eat 1 vegetarian meal one day each week. Try to work up to 2 vegetarian meals, if possible.  Eat seafood 2 or more times a week.  Have healthy snacks readily available, such as: ? Vegetable sticks with hummus. ? [place] yogurt. ? Fruit and nut trail mix.  Eat balanced meals throughout the week. This includes: ? Fruit: 2-3 servings a day ? Vegetables: 4-5 servings a day ? Low-fat dairy: 2 servings a  day ? Fish, poultry, or lean meat: 1 serving a day ? Beans and legumes: 2 or more servings a week ? Nuts and seeds: 1-2 servings a day ? Whole grains: 6-8 servings a day ? Extra-virgin olive oil: 3-4 servings a day  Limit red meat and sweets to only a few servings a month   What are my food choices?  Mediterranean diet ? Recommended  Grains: Whole-grain pasta. Brown rice. Bulgar wheat. Polenta. Couscous. Whole-wheat bread. [place].  Vegetables: Artichokes. Beets. Broccoli. Cabbage. Carrots. Eggplant. Green beans. Chard. Kale. Spinach. Onions. Leeks. Peas. Squash. Tomatoes. Peppers. Radishes.  Fruits: Apples. Apricots. Avocado. Berries. Bananas. Cherries. Dates. Figs. Grapes. Lemons. Melon. Oranges. Peaches. Plums. Pomegranate.  Meats and other protein foods: Beans. Almonds. Sunflower seeds. Pine nuts. Peanuts. [place]. Salmon. Scallops. Shrimp. [place]. Tilapia. Clams. Oysters. Eggs.  Dairy: Low-fat milk. Cheese. Greek yogurt.  Beverages: Water. Red wine. Herbal tea.  Fats and oils: Extra virgin olive oil. Avocado oil. Grape seed oil.  Sweets and desserts: [place] yogurt with honey. Baked apples. Poached pears. Trail mix.  Seasoning and other foods: Basil. Cilantro. Coriander. Cumin. Mint. Parsley. Sage. Rosemary. Tarragon. Garlic. Oregano. Thyme. Pepper. Balsalmic vinegar. Tahini. Hummus. Tomato sauce. Olives. Mushrooms. ? Limit these  Grains: Prepackaged pasta or rice dishes. Prepackaged cereal with added sugar.  Vegetables: Deep fried potatoes (french fries).  Fruits: Fruit canned in syrup.  Meats and other protein foods: Beef. Pork. Lamb. Poultry with skin. Hot dogs. Berniece Salines.  Dairy: Ice cream. Sour cream. Whole milk.  Beverages: Juice. Sugar-sweetened soft drinks. Beer. Liquor and spirits.  Fats and oils: Butter. Canola oil. Vegetable oil. Beef fat (tallow). Lard.  Sweets and desserts: Cookies. Cakes. Pies. Candy.  Seasoning and other foods: Mayonnaise. Premade  sauces and marinades. The items listed may not be a complete list. Talk with your dietitian about what dietary choices are right for you. Summary  The Mediterranean diet includes both food and lifestyle choices.  Eat a variety of fresh fruits and vegetables, beans, nuts, seeds, and whole grains.  Limit the amount of red meat and sweets that you eat.  Talk with your health care provider about whether it is safe for you to drink red wine in moderation. This means 1 glass a day for nonpregnant women and 2 glasses a day for men. A glass of wine equals 5 oz (150 mL). This information is not intended to replace advice given to you by your health care provider. Make sure you discuss any questions you have with your health care provider. Document Revised: 10/29/2015 Document Reviewed: 10/22/2015 Elsevier Patient Education  2020 [employer]  

## 2020-05-14 NOTE — Progress Notes (Signed)
Sarah Nicholson is a 50 y.o. female who presents to  [facility]  today, 05/14/20, seeking care for the following:  . BP follow-up, seen 03/11/20 (a little over 2 months ago) w/ elevated BP, f/u ER hypertensive urgency. Added enalapril 5 mg bid to increase to 10 mg bid, added Clonidine prn, was already taking HCTZ 12.5 bid and advised continue this. Followed up 03/18/20 (2 months ago) and was doing well on Enalapril 10 mg bid and HCTZ 12.5 mg bid w/ BP at that visit 105/70. Pt today states she has NOT been taking HCTZ bid only daily. Home BP have been up and down, 120/80 or less but occasionally 180/100s.   BP Readings from Last 3 Encounters:  05/14/20 (!) 144/77  03/25/20 132/64  03/18/20 105/70     Diet questions: has dome some reading on DASH diet        ASSESSMENT & PLAN with other pertinent findings:  The encounter diagnosis was Essential hypertension. Labile BP. 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 May consider referral to post-COVID clinic to get their input on this.     Patient Instructions   PLAN: HCTZ 21.5 mg twice daily Enalapril 10 mg twice dialy  Recheck via MyChart message next week If BP ok can restart Adderall then        [email or web address]">  DASH Eating Plan DASH stands for Dietary Approaches to Stop Hypertension. The DASH eating plan is a healthy eating plan that has been shown to:  Reduce high blood pressure (hypertension).  Reduce your risk for type 2 diabetes, heart disease, and stroke.  Help with weight loss. What are tips for following this plan? Reading food labels  Check food labels for the amount of salt (sodium) per serving. Choose foods with less than 5 percent  of the Daily Value of sodium. Generally, foods with less than 300 milligrams (mg) of sodium per serving fit into this eating plan.  To find whole grains, look for the word "whole" as the first word in the ingredient list. Shopping  Buy products labeled as "low-sodium" or "no salt added."  Buy fresh foods. Avoid canned foods and pre-made or frozen meals. Cooking  Avoid adding salt when cooking. Use salt-free seasonings or herbs instead of table salt or sea salt. Check with your health care provider or pharmacist before using salt substitutes.  Do not fry foods. Cook foods using healthy methods such as baking, boiling, grilling, roasting, and broiling instead.  Cook with heart-healthy oils, such as olive, canola, avocado, soybean, or sunflower oil. Meal planning  Eat a balanced diet that includes: ? 4 or more servings of fruits and 4 or more servings of vegetables each day. Try to fill one-half of your plate with fruits and vegetables. ? 6-8 servings of whole grains each day. ? Less than 6 oz (170 g) of lean meat, poultry, or fish each day. A 3-oz (85-g) serving of meat is about the same size as a deck of cards. One egg equals 1 oz (28 g). ? 2-3 servings of low-fat dairy each day. One serving is 1 cup (237 mL). ? 1 serving of nuts, seeds, or beans 5 times each week. ? 2-3 servings of heart-healthy fats. Healthy fats called omega-3 fatty acids are found in foods such as walnuts, flaxseeds,  fortified milks, and eggs. These fats are also found in cold-water fish, such as sardines, salmon, and mackerel.  Limit how much you eat of: ? Canned or prepackaged foods. ? Food that is high in trans fat, such as some fried foods. ? Food that is high in saturated fat, such as fatty meat. ? Desserts and other sweets, sugary drinks, and other foods with added sugar. ? Full-fat dairy products.  Do not salt foods before eating.  Do not eat more than 4 egg yolks a week.  Try to eat at least 2  vegetarian meals a week.  Eat more home-cooked food and less restaurant, buffet, and fast food.   Lifestyle  When eating at a restaurant, ask that your food be prepared with less salt or no salt, if possible.  If you drink alcohol: ? Limit how much you use to:  0-1 drink a day for women who are not pregnant.  0-2 drinks a day for men. ? Be aware of how much alcohol is in your drink. In the [place] one drink equals one 12 oz bottle of beer (355 mL), one 5 oz glass of wine (148 mL), or one 1 oz glass of hard liquor (44 mL). General information  Avoid eating more than 2,300 mg of salt a day. If you have hypertension, you may need to reduce your sodium intake to 1,500 mg a day.  Work with your health care provider to maintain a healthy body weight or to lose weight. Ask what an ideal weight is for you.  Get at least 30 minutes of exercise that causes your heart to beat faster (aerobic exercise) most days of the week. Activities may include walking, swimming, or biking.  Work with your health care provider or dietitian to adjust your eating plan to your individual calorie needs. What foods should I eat? Fruits All fresh, dried, or frozen fruit. Canned fruit in natural juice (without added sugar). Vegetables Fresh or frozen vegetables (raw, steamed, roasted, or grilled). Low-sodium or reduced-sodium tomato and vegetable juice. Low-sodium or reduced-sodium tomato sauce and tomato paste. Low-sodium or reduced-sodium canned vegetables. Grains Whole-grain or whole-wheat bread. Whole-grain or whole-wheat pasta. Brown rice. [place]. Bulgur. Whole-grain and low-sodium cereals. Pita bread. Low-fat, low-sodium crackers. Whole-wheat flour tortillas. Meats and other proteins Skinless chicken or [place]. Ground chicken or [place]. Pork with fat trimmed off. Fish and seafood. Egg whites. Dried beans, peas, or lentils. Unsalted nuts, nut butters, and seeds. Unsalted canned beans. Lean cuts of beef  with fat trimmed off. Low-sodium, lean precooked or cured meat, such as sausages or meat loaves. Dairy Low-fat (1%) or fat-free (skim) milk. Reduced-fat, low-fat, or fat-free cheeses. Nonfat, low-sodium ricotta or cottage cheese. Low-fat or nonfat yogurt. Low-fat, low-sodium cheese. Fats and oils Soft margarine without trans fats. Vegetable oil. Reduced-fat, low-fat, or light mayonnaise and salad dressings (reduced-sodium). Canola, safflower, olive, avocado, soybean, and sunflower oils. Avocado. Seasonings and condiments Herbs. Spices. Seasoning mixes without salt. Other foods Unsalted popcorn and pretzels. Fat-free sweets. The items listed above may not be a complete list of foods and beverages you can eat. Contact a dietitian for more information. What foods should I avoid? Fruits Canned fruit in a light or heavy syrup. Fried fruit. Fruit in cream or butter sauce. Vegetables Creamed or fried vegetables. Vegetables in a cheese sauce. Regular canned vegetables (not low-sodium or reduced-sodium). Regular canned tomato sauce and paste (not low-sodium or reduced-sodium). Regular tomato and vegetable juice (not low-sodium or reduced-sodium). Angie Fava. Olives. Grains  Baked goods made with fat, such as croissants, muffins, or some breads. Dry pasta or rice meal packs. Meats and other proteins Fatty cuts of meat. Ribs. Fried meat. [place] Salines. Bologna, salami, and other precooked or cured meats, such as sausages or meat loaves. Fat from the back of a pig (fatback). Bratwurst. Salted nuts and seeds. Canned beans with added salt. Canned or smoked fish. Whole eggs or egg yolks. Chicken or [place] with skin. Dairy Whole or 2% milk, cream, and half-and-half. Whole or full-fat cream cheese. Whole-fat or sweetened yogurt. Full-fat cheese. Nondairy creamers. Whipped toppings. Processed cheese and cheese spreads. Fats and oils Butter. Stick margarine. Lard. Shortening. Ghee. Bacon fat. Tropical oils, such as coconut,  palm kernel, or palm oil. Seasonings and condiments Onion salt, garlic salt, seasoned salt, table salt, and sea salt. Worcestershire sauce. Tartar sauce. Barbecue sauce. Teriyaki sauce. Soy sauce, including reduced-sodium. Steak sauce. Canned and packaged gravies. Fish sauce. Oyster sauce. Cocktail sauce. Store-bought horseradish. Ketchup. Mustard. Meat flavorings and tenderizers. Bouillon cubes. Hot sauces. Pre-made or packaged marinades. Pre-made or packaged taco seasonings. Relishes. Regular salad dressings. Other foods Salted popcorn and pretzels. The items listed above may not be a complete list of foods and beverages you should avoid. Contact a dietitian for more information. Where to find more information  National Heart, Lung, and Blood Institute: https://wilson-eaton.com/  American Heart Association: www.heart.org  Academy of Nutrition and Dietetics: www.eatright.[place]: www.kidney.org Summary  The DASH eating plan is a healthy eating plan that has been shown to reduce high blood pressure (hypertension). It may also reduce your risk for type 2 diabetes, heart disease, and stroke.  When on the DASH eating plan, aim to eat more fresh fruits and vegetables, whole grains, lean proteins, low-fat dairy, and heart-healthy fats.  With the DASH eating plan, you should limit salt (sodium) intake to 2,300 mg a day. If you have hypertension, you may need to reduce your sodium intake to 1,500 mg a day.  Work with your health care provider or dietitian to adjust your eating plan to your individual calorie needs. This information is not intended to replace advice given to you by your health care provider. Make sure you discuss any questions you have with your health care provider. Document Revised: 02/01/2019 Document Reviewed: 02/01/2019 Elsevier Patient Education  2021 [place] refers to food and lifestyle choices that  are based on the traditions of countries located on the [employer]. This way of eating has been shown to help prevent certain conditions and improve outcomes for people who have chronic diseases, like kidney disease and heart disease. What are tips for following this plan? Lifestyle  Cook and eat meals together with your family, when possible.  Drink enough fluid to keep your urine clear or pale yellow.  Be physically active every day. This includes: ? Aerobic exercise like running or swimming. ? Leisure activities like gardening, walking, or housework.  Get 7-8 hours of sleep each night.  If recommended by your health care provider, drink red wine in moderation. This means 1 glass a day for nonpregnant women and 2 glasses a day for men. A glass of wine equals 5 oz (150 mL). Reading food labels  Check the serving size of packaged foods. For foods such as rice and pasta, the serving size refers to the amount of cooked product, not dry.  Check the total fat in packaged foods. Avoid foods that  have saturated fat or trans fats.  Check the ingredients list for added sugars, such as corn syrup.   Shopping  At the grocery store, buy most of your food from the areas near the walls of the store. This includes: ? Fresh fruits and vegetables (produce). ? Grains, beans, nuts, and seeds. Some of these may be available in unpackaged forms or large amounts (in bulk). ? Fresh seafood. ? Poultry and eggs. ? Low-fat dairy products.  Buy whole ingredients instead of prepackaged foods.  Buy fresh fruits and vegetables in-season from local farmers markets.  Buy frozen fruits and vegetables in resealable bags.  If you do not have access to quality fresh seafood, buy precooked frozen shrimp or canned fish, such as tuna, salmon, or sardines.  Buy small amounts of raw or cooked vegetables, salads, or olives from the deli or salad bar at your store.  Stock your pantry so you always have certain  foods on hand, such as olive oil, canned tuna, canned tomatoes, rice, pasta, and beans. Cooking  Cook foods with extra-virgin olive oil instead of using butter or other vegetable oils.  Have meat as a side dish, and have vegetables or grains as your main dish. This means having meat in small portions or adding small amounts of meat to foods like pasta or stew.  Use beans or vegetables instead of meat in common dishes like chili or lasagna.  Experiment with different cooking methods. Try roasting or broiling vegetables instead of steaming or sauteing them.  Add frozen vegetables to soups, stews, pasta, or rice.  Add nuts or seeds for added healthy fat at each meal. You can add these to yogurt, salads, or vegetable dishes.  Marinate fish or vegetables using olive oil, lemon juice, garlic, and fresh herbs. Meal planning  Plan to eat 1 vegetarian meal one day each week. Try to work up to 2 vegetarian meals, if possible.  Eat seafood 2 or more times a week.  Have healthy snacks readily available, such as: ? Vegetable sticks with hummus. ? [place] yogurt. ? Fruit and nut trail mix.  Eat balanced meals throughout the week. This includes: ? Fruit: 2-3 servings a day ? Vegetables: 4-5 servings a day ? Low-fat dairy: 2 servings a day ? Fish, poultry, or lean meat: 1 serving a day ? Beans and legumes: 2 or more servings a week ? Nuts and seeds: 1-2 servings a day ? Whole grains: 6-8 servings a day ? Extra-virgin olive oil: 3-4 servings a day  Limit red meat and sweets to only a few servings a month   What are my food choices?  Mediterranean diet ? Recommended  Grains: Whole-grain pasta. Brown rice. Bulgar wheat. Polenta. Couscous. Whole-wheat bread. [place].  Vegetables: Artichokes. Beets. Broccoli. Cabbage. Carrots. Eggplant. Green beans. Chard. Kale. Spinach. Onions. Leeks. Peas. Squash. Tomatoes. Peppers. Radishes.  Fruits: Apples. Apricots. Avocado. Berries. Bananas.  [place]. Dates. Figs. Grapes. Lemons. Melon. Oranges. Peaches. Plums. Pomegranate.  Meats and other protein foods: Beans. Almonds. Sunflower seeds. Pine nuts. Peanuts. [place]. Salmon. Scallops. Shrimp. [place]. Tilapia. Clams. Oysters. Eggs.  Dairy: Low-fat milk. Cheese. Greek yogurt.  Beverages: Water. Red wine. Herbal tea.  Fats and oils: Extra virgin olive oil. Avocado oil. Grape seed oil.  Sweets and desserts: [place] yogurt with honey. Baked apples. Poached pears. Trail mix.  Seasoning and other foods: Basil. Cilantro. Coriander. Cumin. Mint. Parsley. Sage. Rosemary. Tarragon. Garlic. Oregano. Thyme. Pepper. Balsalmic vinegar. Tahini. Hummus. Tomato sauce. Olives. Mushrooms. ? Limit these  Grains: Prepackaged pasta or rice dishes. Prepackaged cereal with added sugar.  Vegetables: Deep fried potatoes (french fries).  Fruits: Fruit canned in syrup.  Meats and other protein foods: Beef. Pork. Lamb. Poultry with skin. Hot dogs. Berniece Salines.  Dairy: Ice cream. Sour cream. Whole milk.  Beverages: Juice. Sugar-sweetened soft drinks. Beer. Liquor and spirits.  Fats and oils: Butter. Canola oil. Vegetable oil. Beef fat (tallow). Lard.  Sweets and desserts: Cookies. Cakes. Pies. Candy.  Seasoning and other foods: Mayonnaise. Premade sauces and marinades. The items listed may not be a complete list. Talk with your dietitian about what dietary choices are right for you. Summary  The Mediterranean diet includes both food and lifestyle choices.  Eat a variety of fresh fruits and vegetables, beans, nuts, seeds, and whole grains.  Limit the amount of red meat and sweets that you eat.  Talk with your health care provider about whether it is safe for you to drink red wine in moderation. This means 1 glass a day for nonpregnant women and 2 glasses a day for men. A glass of wine equals 5 oz (150 mL). This information is not intended to replace advice given to you by your health care provider. Make sure  you discuss any questions you have with your health care provider. Document Revised: 10/29/2015 Document Reviewed: 10/22/2015 Elsevier Patient Education  [place].    No orders of the defined types were placed in this encounter.   No orders of the defined types were placed in this encounter.    See below for relevant physical exam findings  See below for recent lab and imaging results reviewed  Medications, allergies, PMH, PSH, SocH, FamH reviewed below    Follow-up instructions: Return for [place].                                        Exam:  BP (!) 144/77 (BP Location: Left Arm, Patient Position: Sitting, Cuff Size: Normal)   Pulse 81   Temp 98.8 F (37.1 C) (Oral)   Resp 20   Ht 5\' 6"  (1.676 m)   Wt 214 lb (97.1 kg)   SpO2 99%   BMI 34.54 kg/m   Constitutional: VS see above. General Appearance: alert, well-developed, well-nourished, NAD  Neck: No masses, trachea midline.   Respiratory: Normal respiratory effort. no wheeze, no rhonchi, no rales  Cardiovascular: S1/S2 normal, no murmur, no rub/gallop auscultated. RRR.   Musculoskeletal: Gait normal. Symmetric and independent movement of all extremities  Neurological: Normal balance/coordination. No tremor.  Skin: warm, dry, intact.   Psychiatric: Normal judgment/insight. Normal mood and affect. Oriented x3.   Current Meds  Medication Sig  . clonazePAM (KLONOPIN) 0.5 MG tablet TAKE 1 TABLET(0.5 MG) BY MOUTH THREE TIMES DAILY AS NEEDED FOR ANXIETY  . cloNIDine (CATAPRES) 0.1 MG tablet Take 1 tablet (0.1 mg total) by mouth 3 (three) times daily as needed (systolic blood pressure >166).  . enalapril (VASOTEC) 10 MG tablet TAKE 1/2 TABLET(5 MG) BY MOUTH TWICE DAILY FOR 1 DAY THEN TAKE 1 TABLET(10 MG) BY MOUTH TWICE DAILY  . EPINEPHRINE 0.3 mg/0.3 mL IJ SOAJ injection INJECT INTRAMUSCULARLY AS DIRECTED  . escitalopram (LEXAPRO) 20 MG tablet Take 2  tablets (40 mg total) by mouth daily.  . hydrochlorothiazide (HYDRODIURIL) 12.5 MG tablet Take 1 tablet (12.5 mg total) by mouth daily.    Allergies  Allergen Reactions  . Macrobid [Nitrofurantoin Macrocrystal] Hives  . Nitrofurantoin Hives and Rash  . Other Hives    Preservative used in vaccinations  . Tetanus Toxoids Swelling    Pt states she was told the Tdap injection would have to be administered in the hospital  After discussion on 11/10 pt remembers being told it was thimersol in the tetanus 30+years ago that caused the swelling.    . Thimerosal Rash    Patient Active Problem List   Diagnosis Date Noted  . 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 Hepatic steatosis 11/13/2018  . Tobacco use 07/18/2018  . Sun-induced skin changes, keratosis 08/08/2017  . Subcutaneous cyst 08/08/2017  . Irritable bowel syndrome (IBS) 05/04/2017  . Generalized anxiety disorder 03/16/2016  . Social anxiety disorder 03/16/2016  . Major depressive disorder, recurrent episode, moderate ([place]) 03/16/2016  . Right inguinal hernia 07/14/2015  . Hallux valgus 12/09/2014  . Acute headache 07/31/2014  . Chest pain 07/31/2014  . Hypotension 07/31/2014  . Other and unspecified ovarian cyst 06/25/2013  . Left tennis elbow 06/25/2013  . Left breast mass 06/27/2012  . Microscopic hematuria 06/27/2012  . Hyperlipidemia 06/14/2012  . Prediabetes 06/14/2012  . History of leukocytosis 06/14/2012  . Seasonal allergies 06/14/2012  . Depression 06/14/2012  . ADD (attention deficit disorder) 06/14/2012    Family History  Problem Relation Age of Onset  . Depression Mother   . Diabetes Father   . Hyperlipidemia Father   . Hypertension Father   . Depression Sister   . Depression Son   . Breast cancer Maternal Grandmother     Social History   Tobacco Use  Smoking Status Current Every Day Smoker  . Packs/day: 0.50  . Types: Cigarettes  Smokeless Tobacco Never Used  Tobacco  Comment   10-20    Past Surgical History:  Procedure Laterality Date  . APPENDECTOMY    . BICEPT TENODESIS Right 10/10/2019   Procedure: BICEPS TENODESIS;  Surgeon: Hiram Gash, MD;  Location: [place];  Service: Orthopedics;  Laterality: Right;  . bladder sling/mesh    . INGUINAL HERNIA REPAIR    . intrauterine ablation    . NASAL SINUS SURGERY    . TUBAL LIGATION      Immunization History  Administered Date(s) Administered  . Hepatitis B, adult 05/09/2019, 06/10/2019  . Influenza Split 11/21/2012  . Influenza,inj,Quad PF,6+ Mos 12/12/2016, 11/29/2017, 11/29/2018  . Influenza-Unspecified 12/02/2014, 12/08/2015, 11/12/2016, 11/29/2017  . Moderna Sars-Covid-2 Vaccination 03/12/2019, 04/09/2019, 04/10/2020  . Pneumococcal Polysaccharide-23 03/14/2010  . Td 03/14/1982  . Tdap 01/21/2013    Recent Results (from the past 2160 hour(s))  Basic metabolic panel     Status: Abnormal   Collection Time: 03/10/20  8:15 PM  Result Value Ref Range   Sodium 137 135 - 145 mmol/L   Potassium 3.2 (L) 3.5 - 5.1 mmol/L   Chloride 104 98 - 111 mmol/L   CO2 23 22 - 32 mmol/L   Glucose, Bld 96 70 - 99 mg/dL    Comment: Glucose reference range applies only to samples taken after fasting for at least 8 hours.   BUN 10 6 - 20 mg/dL   Creatinine, Ser 0.84 0.44 - 1.00 mg/dL   Calcium 8.5 (L) 8.9 - 10.3 mg/dL   GFR, Estimated >60 >60 mL/min    Comment: (NOTE) Calculated using the CKD-EPI Creatinine Equation (2021)    Anion gap 10 5 - 15    Comment: Performed at [facility]  [address], [place] [place], [place] [address]  CBC     Status: Abnormal   Collection Time: 03/10/20  8:15 PM  Result Value Ref Range   WBC 16.6 (H) 4.0 - 10.5 K/uL   RBC 4.99 3.87 - 5.11 MIL/uL   Hemoglobin 15.1 (H) 12.0 - 15.0 g/dL   HCT 44.7 36.0 - 46.0 %   MCV 89.6 80.0 - 100.0 fL   MCH 30.3 26.0 - 34.0 pg   MCHC 33.8 30.0 - 36.0 g/dL   RDW 12.5 11.5 - 15.5 %   Platelets 352 150 - 400 K/uL    nRBC 0.0 0.0 - 0.2 %    Comment: Performed at [facility], [address] [place], [place] [address]  Troponin I (High Sensitivity)     Status: None   Collection Time: 03/10/20  8:15 PM  Result Value Ref Range   Troponin I (High Sensitivity) 4 <18 ng/L    Comment: (NOTE) Elevated high sensitivity troponin I (hsTnI) values and significant  changes across serial measurements may suggest ACS but many other  chronic and acute conditions are known to elevate hsTnI results.  Refer to the "Links" section for chest pain algorithms and additional  guidance. Performed at [facility], [address] [place], [place] [address]   Pregnancy, urine     Status: None   Collection Time: 03/10/20  8:15 PM  Result Value Ref Range   Preg Test, Ur NEGATIVE NEGATIVE    Comment:        THE SENSITIVITY OF THIS METHODOLOGY IS >20 mIU/mL. Performed at [facility], [address] [place], [place] [address]   Troponin I (High Sensitivity)     Status: None   Collection Time: 03/10/20 10:18 PM  Result Value Ref Range   Troponin I (High Sensitivity) 4 <18 ng/L    Comment: (NOTE) Elevated high sensitivity troponin I (hsTnI) values and significant  changes across serial measurements may suggest ACS but many other  chronic and acute conditions are known to elevate hsTnI results.  Refer to the "Links" section for chest pain algorithms and additional  guidance. Performed at [facility], [address] [place], [place] [address]   COMPLETE METABOLIC PANEL WITH GFR     Status: None   Collection Time: 03/16/20 11:53 AM  Result Value Ref Range   Glucose, Bld 97 65 - 99 mg/dL    Comment: .            Fasting reference interval .    BUN 12 7 - 25 mg/dL   Creat 0.98 0.50 - 1.05 mg/dL    Comment: For patients >26 years of age, the reference limit for Creatinine is approximately 13% higher for people identified as African-American. .    GFR, Est Non  African American 67 > OR = 60 mL/min/1.38m2   GFR, Est African American 77 > OR = 60 mL/min/1.81m2   BUN/Creatinine Ratio NOT APPLICABLE 6 - 22 (calc)   Sodium 137 135 - 146 mmol/L   Potassium 4.4 3.5 - 5.3 mmol/L   Chloride 104 98 - 110 mmol/L   CO2 27 20 - 32 mmol/L   Calcium 9.2 8.6 - 10.4 mg/dL   Total Protein 6.9 6.1 - 8.1 g/dL   Albumin 4.0 3.6 - 5.1 g/dL   Globulin 2.9 1.9 - 3.7 g/dL (calc)   AG Ratio 1.4 1.0 - 2.5 (calc)   Total  Bilirubin 0.4 0.2 - 1.2 mg/dL   Alkaline phosphatase (APISO) 78 37 - 153 U/L   AST 11 10 - 35 U/L   ALT 12 6 - 29 U/L  CBC with Differential/Platelet     Status: Abnormal   Collection Time: 03/16/20 11:53 AM  Result Value Ref Range   WBC 13.1 (H) 3.8 - 10.8 Thousand/uL   RBC 5.47 (H) 3.80 - 5.10 Million/uL   Hemoglobin 16.4 (H) 11.7 - 15.5 g/dL   HCT 48.3 (H) 35.0 - 45.0 %   MCV 88.3 80.0 - 100.0 fL   MCH 30.0 27.0 - 33.0 pg   MCHC 34.0 32.0 - 36.0 g/dL   RDW 12.5 11.0 - 15.0 %   Platelets 432 (H) 140 - 400 Thousand/uL   MPV 9.4 7.5 - 12.5 fL   Neutro Abs 8,292 (H) 1,500 - 7,800 cells/uL   Lymphs Abs 3,694 850 - 3,900 cells/uL   Absolute Monocytes 956 (H) 200 - 950 cells/uL   Eosinophils Absolute 105 15 - 500 cells/uL   Basophils Absolute 52 0 - 200 cells/uL   Neutrophils Relative % 63.3 %   Total Lymphocyte 28.2 %   Monocytes Relative 7.3 %   Eosinophils Relative 0.8 %   Basophils Relative 0.4 %  TSH     Status: None   Collection Time: 03/16/20 11:53 AM  Result Value Ref Range   TSH 2.93 mIU/L    Comment:           Reference Range .           > or = 20 Years  0.40-4.50 .                Pregnancy Ranges           First trimester    0.26-2.66           Second trimester   0.55-2.73           Third trimester    0.43-2.91     No results found.     All questions at time of visit were answered - patient instructed to contact office with any additional concerns or updates. ER/RTC precautions were reviewed with the patient as  applicable.   Please note: manual typing as well as voice recognition software may have been used to produce this document - typos may escape review. Please contact Dr. Sheppard Coil for any needed clarifications.

## 2020-07-03 ENCOUNTER — Other Ambulatory Visit: Payer: Self-pay | Admitting: Osteopathic Medicine

## 2020-07-03 ENCOUNTER — Other Ambulatory Visit: Payer: Self-pay | Admitting: Nurse Practitioner

## 2020-07-03 DIAGNOSIS — I10 Essential (primary) hypertension: Secondary | ICD-10-CM

## 2020-09-21 ENCOUNTER — Emergency Department (INDEPENDENT_AMBULATORY_CARE_PROVIDER_SITE_OTHER)
Admission: EM | Admit: 2020-09-21 | Discharge: 2020-09-21 | Disposition: A | Discharge status: Home / Self Care | Payer: 59 | Source: Home / Self Care

## 2020-09-21 ENCOUNTER — Other Ambulatory Visit: Payer: Self-pay | Admitting: *Deleted

## 2020-09-21 ENCOUNTER — Encounter: Payer: Self-pay | Admitting: Emergency Medicine

## 2020-09-21 ENCOUNTER — Other Ambulatory Visit: Payer: Self-pay

## 2020-09-21 DIAGNOSIS — T7840XA Allergy, unspecified, initial encounter: Secondary | ICD-10-CM

## 2020-09-21 DIAGNOSIS — L509 Urticaria, unspecified: Secondary | ICD-10-CM | POA: Diagnosis not present

## 2020-09-21 MED ORDER — EPINEPHRINE 0.3 MG/0.3ML IJ SOAJ: INTRAMUSCULAR | 2 refills | Status: DC

## 2020-09-21 MED ORDER — METHYLPREDNISOLONE ACETATE 80 MG/ML IJ SUSP
60.0000 mg | Freq: Once | INTRAMUSCULAR | Status: AC
  Administered 2020-09-21: 60 mg via INTRAMUSCULAR

## 2020-09-21 NOTE — ED Provider Notes (Signed)
[name]    CSN: [id number] Arrival date & time: 09/21/20  1126      History   Chief Complaint Chief Complaint  Patient presents with   Allergic Reaction    HPI Sarah Nicholson is a 50 y.o. female.   Patient presents today with a several hour history of allergic reaction symptoms.  She reports a coworker walked by with a strong perfume and initially she had some coughing and shortness of breath.  After being exposed to the perfume again she developed a scratchy throat and shortness of breath.  She then left and took 50 mg of Benadryl which has provided significant improvement of symptoms.  Reports that scratchy throat and shortness of breath have improved and she is now feeling normal.  She is tired due to antihistamine use.  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  She does not have an EpiPen available but has contacted her primary care provider who is sent a prescription to the pharmacy.  She denies any muffled voice, shortness of breath, swelling of throat or mouth, chest pain, nausea, vomiting, lightheadedness, dizziness.   Past Medical History:  Diagnosis Date   ADD (attention deficit disorder) 06/14/2012   Anxiety    Bronchitis    Complication of anesthesia 1987   states woke up during appedentomy   Depression 06/14/2012   Hepatic steatosis 11/13/2018   Hepatic steatosis with hepatomegaly seen on MRI abd on 11/2018   History of leukocytosis 06/14/2012   Reports "WBC 32" decades ago with negative hematology specialist workup.    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   Hepatic steatosis 11/13/2018   Tobacco use 07/18/2018   Sun-induced skin changes, keratosis 08/08/2017   Subcutaneous cyst 08/08/2017   Irritable bowel syndrome (IBS)  05/04/2017   Generalized anxiety disorder 03/16/2016   Social anxiety disorder 03/16/2016   Major depressive disorder, recurrent episode, moderate ([place]) 03/16/2016   Right inguinal hernia 07/14/2015   Hallux valgus 12/09/2014   Acute headache 07/31/2014   Chest pain 07/31/2014   Hypotension 07/31/2014   Other and unspecified ovarian cyst 06/25/2013   Left tennis elbow 06/25/2013   Left breast mass 06/27/2012   Microscopic hematuria 06/27/2012   Hyperlipidemia 06/14/2012   Prediabetes 06/14/2012   History of leukocytosis 06/14/2012   Seasonal allergies 06/14/2012   Depression 06/14/2012   ADD (attention deficit disorder) 06/14/2012    Past Surgical History:  Procedure Laterality Date   APPENDECTOMY     BICEPT TENODESIS Right 10/10/2019   Procedure: BICEPS TENODESIS;  Surgeon: Hiram Gash, MD;  Location: [facility];  Service: Orthopedics;  Laterality: Right;   bladder sling/mesh     INGUINAL HERNIA REPAIR     intrauterine ablation     NASAL SINUS SURGERY     TUBAL LIGATION      OB History   No obstetric history on file.      Home Medications    Prior to Admission medications   Medication Sig Start Date End Date Taking? Authorizing Provider  clonazePAM (KLONOPIN) 0.5 MG tablet TAKE 1 TABLET(0.5 MG) BY MOUTH THREE TIMES DAILY AS NEEDED FOR ANXIETY 10/26/19  Yes Emeterio Reeve, DO  cloNIDine (CATAPRES) 0.1 MG tablet Take 1 tablet (0.1 mg total) by mouth 3 (  three) times daily as needed (systolic blood pressure >824). 03/11/20  Yes Alexander, Lanelle Bal, DO  enalapril (VASOTEC) 10 MG tablet TAKE 1/2 TABLET(5 MG) BY MOUTH TWICE DAILY FOR 1 DAY THEN TAKE 1 TABLET(10 MG) BY MOUTH TWICE DAILY 07/03/20  Yes Emeterio Reeve, DO  EPINEPHrine 0.3 mg/0.3 mL IJ SOAJ injection INJECT INTRAMUSCULARLY AS DIRECTED 09/21/20  Yes Emeterio Reeve, DO  escitalopram (LEXAPRO) 20 MG tablet Take 2 tablets (40 mg total) by mouth daily. 12/19/19  Yes Emeterio Reeve, DO   hydrochlorothiazide (HYDRODIURIL) 12.5 MG tablet TAKE 1 TABLET(12.5 MG) BY MOUTH DAILY 07/06/20  Yes Emeterio Reeve, DO  amphetamine-dextroamphetamine (ADDERALL) 10 MG tablet Take 1 tablet (10 mg total) by mouth 2 (two) times daily. Patient not taking: Reported on 03/18/2020 02/04/19 10/02/19  Emeterio Reeve, DO  lidocaine (XYLOCAINE) 2 % solution Use as directed 10 mLs in the mouth or throat every 3 (three) hours as needed (mouth/throat pain). Patient not taking: Reported on 05/14/2020 03/25/20   Emeterio Reeve, DO  tiZANidine (ZANAFLEX) 4 MG tablet Take 1 tablet (4 mg total) by mouth every 6 (six) hours as needed for muscle spasms. Patient not taking: Reported on 05/14/2020 12/18/19   Scot Jun, FNP    Family History Family History  Problem Relation Age of Onset   Depression Mother    Diabetes Father    Hyperlipidemia Father    Hypertension Father    Depression Sister    Depression Son    Breast cancer Maternal Grandmother     Social History Social History   Tobacco Use   Smoking status: Every Day    Packs/day: 0.50    Pack years: 0.00    Types: Cigarettes   Smokeless tobacco: Never   Tobacco comments:    10-20  Vaping Use   Vaping Use: Never used  Substance Use Topics   Alcohol use: Yes    Comment: 1-2 drinks every 6 months   Drug use: No     Allergies   Macrobid [nitrofurantoin macrocrystal], Nitrofurantoin, Other, Tetanus toxoids, and Thimerosal   Review of Systems Review of Systems  Constitutional:  Negative for activity change, appetite change, fatigue and fever.  HENT:  Negative for congestion, sore throat (Initially scratchy but this has improved.), trouble swallowing and voice change.   Respiratory:  Negative for cough and shortness of breath.   Cardiovascular:  Negative for chest pain.  Gastrointestinal:  Negative for abdominal pain, diarrhea, nausea and vomiting.  Neurological:  Negative for dizziness, light-headedness and headaches.     Physical Exam Triage Vital Signs ED Triage Vitals  Enc Vitals Group     BP 09/21/20 1146 (!) 138/93     Pulse Rate 09/21/20 1146 71     Resp 09/21/20 1146 (!) 24     Temp 09/21/20 1146 98.5 F (36.9 C)     Temp Source 09/21/20 1146 Oral     SpO2 09/21/20 1146 96 %     Weight 09/21/20 1148 210 lb (95.3 kg)     Height 09/21/20 1148 5\' 6"  (1.676 m)     Head Circumference --      Peak Flow --      Pain Score 09/21/20 1148 0     Pain Loc --      Pain Edu? --      Excl. in [place]? --    No data found.  Updated Vital Signs BP (!) 138/93 (BP Location: Right Arm)   Pulse 71   Temp 98.5 F (36.9  C) (Oral)   Resp 16   Ht 5\' 6"  (1.676 m)   Wt 210 lb (95.3 kg)   SpO2 96%   BMI 33.89 kg/m   Visual Acuity Right Eye Distance:   Left Eye Distance:   Bilateral Distance:    Right Eye Near:   Left Eye Near:    Bilateral Near:     Physical Exam Vitals reviewed.  Constitutional:      General: She is awake. She is not in acute distress.    Appearance: Normal appearance. She is normal weight. She is not ill-appearing.     Comments: Very pleasant female appears stated age in no acute distress sitting comfortably in exam room  HENT:     Head: Normocephalic and atraumatic.     Nose: Nose normal.     Mouth/Throat:     Pharynx: Uvula midline. No oropharyngeal exudate or posterior oropharyngeal erythema.     Comments: Clear posterior oropharynx Cardiovascular:     Rate and Rhythm: Normal rate and regular rhythm.     Heart sounds: Normal heart sounds, S1 normal and S2 normal. No murmur heard. Pulmonary:     Effort: Pulmonary effort is normal.     Breath sounds: Normal breath sounds. No wheezing, rhonchi or rales.     Comments: Clear to auscultation bilaterally Abdominal:     General: Bowel sounds are normal.     Palpations: Abdomen is soft.     Tenderness: There is no abdominal tenderness. There is no right CVA tenderness, left CVA tenderness, guarding or rebound.  Psychiatric:         Behavior: Behavior is cooperative.     UC Treatments / Results  Labs (all labs ordered are listed, but only abnormal results are displayed) Labs Reviewed - No data to display  EKG   Radiology No results found.  Procedures Procedures (including critical care time)  Medications Ordered in UC Medications  methylPREDNISolone acetate (DEPO-MEDROL) injection 60 mg (60 mg Intramuscular Given 09/21/20 1234)    Initial Impression / Assessment and Plan / UC Course  I have reviewed the triage vital signs and the nursing notes.  Pertinent labs & imaging results that were available during my care of the patient were reviewed by me and considered in my medical decision making (see chart for details).      Patient reported significant improvement of symptoms with Benadryl prior to arrival.  She was given Depo-Medrol 60 mg.  Recommended that she alternate antihistamines (Zyrtec and Pepcid) for additional symptom relief.  She reports her primary care provider has sent in an EpiPen and she will make sure she has this available.  Discussed alarm symptoms that warrant emergent evaluation.  Strict return precautions given to which patient expressed understanding.  Patient stable at the time of discharge.  Final Clinical Impressions(s) / UC Diagnoses   Final diagnoses:  Allergic reaction, initial encounter  Urticaria     Discharge Instructions      You are given an injection of steroids.  Please continue antihistamines as we discussed by taking Zyrtec in the morning and Pepcid 40 mg at night.  Make sure that you have an EpiPen available at all times.  If you develop any worsening symptoms including trouble speaking, trouble swallowing, throat/tongue swelling, shortness of breath you need to go to the emergency room.  If you feel you need to use your EpiPen you should call 911.     ED Prescriptions   None    PDMP not  reviewed this encounter.   Terrilee Croak, PA-C 09/21/20  1301

## 2020-09-21 NOTE — ED Triage Notes (Signed)
Patient states that she had an allergic reaction to someone's body spray/perfume at work today.  Patient began coughing, scratchy throat, hives on chest.  Patient took 2 Benadryl 25mg  and went outside for fresh air.  Patient is sleepy from the medication.

## 2020-09-21 NOTE — Discharge Instructions (Addendum)
You are given an injection of steroids.  Please continue antihistamines as we discussed by taking Zyrtec in the morning and Pepcid 40 mg at night.  Make sure that you have an EpiPen available at all times.  If you develop any worsening symptoms including trouble speaking, trouble swallowing, throat/tongue swelling, shortness of breath you need to go to the emergency room.  If you feel you need to use your EpiPen you should call 911.

## 2020-10-01 ENCOUNTER — Other Ambulatory Visit: Payer: Self-pay | Admitting: Osteopathic Medicine

## 2020-10-06 ENCOUNTER — Other Ambulatory Visit: Payer: Self-pay

## 2020-10-06 ENCOUNTER — Ambulatory Visit (INDEPENDENT_AMBULATORY_CARE_PROVIDER_SITE_OTHER): Payer: 59 | Admitting: Osteopathic Medicine

## 2020-10-06 VITALS — BP 129/64 | HR 73 | Temp 97.1°F | Wt 208.0 lb

## 2020-10-06 DIAGNOSIS — T148XXA Other injury of unspecified body region, initial encounter: Secondary | ICD-10-CM

## 2020-10-06 NOTE — Progress Notes (Signed)
Sarah Nicholson is a 50 y.o. female who presents to  [facility] at [facility]  today, 10/06/20, seeking care for the following:  R leg cramping x4 days, awoke her in the night with bad cramp lasting a couple minutes. Residual soreness has been troubling.  On exam RLE: no palpable cord, neg Homan's, reports tenderness to palpation medial gastrocnemius, focal. No edema, no skin discoloration or ecchymoses.      ASSESSMENT & PLAN with other pertinent findings:  The encounter diagnosis was Muscle strain.   Offered [place] to evaluate for hematoma, low suspicion for DVT given history and exam. Pt opts to treat w/ NSAID and wrapping leg w/ ACE wrap, I applied ACE wrap today to demonstrate. Pt to RTC prn!   There are no Patient Instructions on file for this visit.  No orders of the defined types were placed in this encounter.   No orders of the defined types were placed in this encounter.    See below for relevant physical exam findings  See below for recent lab and imaging results reviewed  Medications, allergies, PMH, PSH, SocH, FamH reviewed below    Follow-up instructions: No follow-ups on file.                                        Exam:  BP 129/64 (BP Location: Left Arm, Patient Position: Sitting, Cuff Size: Normal)   Pulse 73   Temp (!) 97.1 F (36.2 C) (Oral)   Wt 208 lb 0.6 oz (94.4 kg)   BMI 33.58 kg/m  Constitutional: VS see above. General Appearance: alert, well-developed, well-nourished, NAD Neck: No masses, trachea midline.  Respiratory: Normal respiratory effort. no wheeze, no rhonchi, no rales Cardiovascular: S1/S2 normal, no murmur, no rub/gallop auscultated. RRR.  Musculoskeletal:  see above Neurological: Normal balance/coordination. No tremor. Skin: warm, dry, intact.  Psychiatric: Normal judgment/insight. Normal mood and affect. Oriented x3.   Current Meds  Medication  Sig   clonazePAM (KLONOPIN) 0.5 MG tablet TAKE 1 TABLET(0.5 MG) BY MOUTH THREE TIMES DAILY AS NEEDED FOR ANXIETY   cloNIDine (CATAPRES) 0.1 MG tablet Take 1 tablet (0.1 mg total) by mouth 3 (three) times daily as needed (systolic blood pressure [id number]).   enalapril (VASOTEC) 10 MG tablet TAKE 1/2 TABLET(5 MG) BY MOUTH TWICE DAILY FOR 1 DAY THEN TAKE 1 TABLET(10 MG) BY MOUTH TWICE DAILY   EPINEPHrine 0.3 mg/0.3 mL IJ SOAJ injection INJECT INTRAMUSCULARLY AS DIRECTED   escitalopram (LEXAPRO) 20 MG tablet Take 2 tablets (40 mg total) by mouth daily.   hydrochlorothiazide (HYDRODIURIL) 12.5 MG tablet TAKE 1 TABLET(12.5 MG) BY MOUTH DAILY    Allergies  Allergen Reactions   Macrobid [Nitrofurantoin Macrocrystal] Hives   Nitrofurantoin Hives and Rash   Other Hives    Preservative used in vaccinations   Tetanus Toxoids Swelling    Pt states she was told the Tdap injection would have to be administered in the hospital  After discussion on 11/10 pt remembers being told it was thimersol in the tetanus 30+years ago that caused the swelling.     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   Hepatic steatosis 11/13/2018   Tobacco use 07/18/2018   Sun-induced skin changes, keratosis 08/08/2017   Subcutaneous cyst 08/08/2017   Irritable bowel  syndrome (IBS) 05/04/2017   Generalized anxiety disorder 03/16/2016   Social anxiety disorder 03/16/2016   Major depressive disorder, recurrent episode, moderate ([place]) 03/16/2016   Right inguinal hernia 07/14/2015   Hallux valgus 12/09/2014   Acute headache 07/31/2014   Chest pain 07/31/2014   Hypotension 07/31/2014   Other and unspecified ovarian cyst 06/25/2013   Left tennis elbow 06/25/2013   Left breast mass 06/27/2012   Microscopic hematuria 06/27/2012   Hyperlipidemia 06/14/2012   Prediabetes 06/14/2012   History of leukocytosis 06/14/2012   Seasonal  allergies 06/14/2012   Depression 06/14/2012   ADD (attention deficit disorder) 06/14/2012    Family History  Problem Relation Age of Onset   Depression Mother    Diabetes Father    Hyperlipidemia Father    Hypertension Father    Depression Sister    Depression Son    Breast cancer Maternal Grandmother     Social History   Tobacco Use  Smoking Status Every Day   Packs/day: 0.50   Types: Cigarettes  Smokeless Tobacco Never  Tobacco Comments   10-20    Past Surgical History:  Procedure Laterality Date   APPENDECTOMY     BICEPT TENODESIS Right 10/10/2019   Procedure: BICEPS TENODESIS;  Surgeon: Hiram Gash, MD;  Location: [facility];  Service: Orthopedics;  Laterality: Right;   bladder sling/mesh     INGUINAL HERNIA REPAIR     intrauterine ablation     NASAL SINUS SURGERY     TUBAL LIGATION      Immunization History  Administered Date(s) Administered   Hepatitis B, adult 05/09/2019, 06/10/2019   Influenza Split 11/21/2012   Influenza,inj,Quad PF,6+ Mos 12/12/2016, 11/29/2017, 11/29/2018   Influenza-Unspecified 12/02/2014, 12/08/2015, 11/12/2016, 11/29/2017   Moderna Sars-Covid-2 Vaccination 03/12/2019, 04/09/2019, 04/10/2020   Pneumococcal Polysaccharide-23 03/14/2010   Td 03/14/1982   Tdap 01/21/2013    No results found for this or any previous visit (from the past 2160 hour(s)).  No results found.     All questions at time of visit were answered - patient instructed to contact office with any additional concerns or updates. ER/RTC precautions were reviewed with the patient as applicable.   Please note: manual typing as well as voice recognition software may have been used to produce this document - typos may escape review. Please contact Dr. Sheppard Coil for any needed clarifications.

## 2020-12-04 ENCOUNTER — Ambulatory Visit (INDEPENDENT_AMBULATORY_CARE_PROVIDER_SITE_OTHER): Payer: 59 | Admitting: Sports Medicine

## 2020-12-04 ENCOUNTER — Ambulatory Visit (INDEPENDENT_AMBULATORY_CARE_PROVIDER_SITE_OTHER): Payer: 59

## 2020-12-04 ENCOUNTER — Other Ambulatory Visit: Payer: Self-pay

## 2020-12-04 ENCOUNTER — Telehealth: Payer: Self-pay

## 2020-12-04 DIAGNOSIS — M7062 Trochanteric bursitis, left hip: Secondary | ICD-10-CM

## 2020-12-04 DIAGNOSIS — M25552 Pain in left hip: Secondary | ICD-10-CM | POA: Diagnosis not present

## 2020-12-04 MED ORDER — MELOXICAM 15 MG PO TABS: ORAL_TABLET | ORAL | 3 refills | Status: DC

## 2020-12-04 NOTE — Progress Notes (Signed)
    Procedures performed today:    Procedure: Real-time Ultrasound Guided injection of the left greater trochanteric bursa Device: Samsung [id number]  Verbal informed consent obtained.  Time-out conducted.  Noted no overlying erythema, induration, or other signs of local infection.  Skin prepped in a sterile fashion.  Local anesthesia: Topical Ethyl chloride.  With sterile technique and under real time ultrasound guidance: Noted distal tendinous calcifications, 1 cc Kenalog 40, 2 cc lidocaine, 2 cc bupivacaine injected easily Completed without difficulty  Advised to call if fevers/chills, erythema, induration, drainage, or persistent bleeding.  Images permanently stored and available for review in PACS.  Impression: Technically successful ultrasound guided injection.  Independent interpretation of notes and tests performed by another provider:   None.  Brief History, Exam, Impression, and Recommendations:    Trochanteric bursitis, left hip Sarah Nicholson is a pleasant 50 year old female, she is had several months of pain in the left hip, localized over the lateral aspect, unable to lay on the ipsilateral side at night. She has tried NSAIDs, topical modalities, stretching and activity modification without improvement. Due to failure of conservative treatment we injected her greater trochanteric bursa as well as hip abductor tendon sheath, adding conditioning exercises, switching to meloxicam, getting some x-rays, return to see me in a month.    ___________________________________________ [name], M.D., ABFM., CAQSM. Primary Care and [place] Instructor of [place] [facility] of [facility]

## 2020-12-04 NOTE — Assessment & Plan Note (Signed)
Sarah Nicholson is a pleasant 50 year old female, she is had several months of pain in the left hip, localized over the lateral aspect, unable to lay on the ipsilateral side at night. She has tried NSAIDs, topical modalities, stretching and activity modification without improvement. Due to failure of conservative treatment we injected her greater trochanteric bursa as well as hip abductor tendon sheath, adding conditioning exercises, switching to meloxicam, getting some x-rays, return to see me in a month.

## 2020-12-04 NOTE — Telephone Encounter (Signed)
Patient would like a refill on Adderall.

## 2020-12-13 ENCOUNTER — Other Ambulatory Visit: Payer: Self-pay | Admitting: Osteopathic Medicine

## 2020-12-13 DIAGNOSIS — F32A Depression, unspecified: Secondary | ICD-10-CM

## 2020-12-13 DIAGNOSIS — F411 Generalized anxiety disorder: Secondary | ICD-10-CM

## 2020-12-14 ENCOUNTER — Other Ambulatory Visit: Payer: Self-pay | Admitting: Osteopathic Medicine

## 2020-12-14 DIAGNOSIS — F410 Panic disorder [episodic paroxysmal anxiety] without agoraphobia: Secondary | ICD-10-CM

## 2020-12-15 ENCOUNTER — Emergency Department (INDEPENDENT_AMBULATORY_CARE_PROVIDER_SITE_OTHER)
Admission: EM | Admit: 2020-12-15 | Discharge: 2020-12-15 | Disposition: A | Discharge status: Home / Self Care | Payer: 59 | Source: Home / Self Care

## 2020-12-15 ENCOUNTER — Emergency Department: Admit: 2020-12-15 | Discharge status: Absent without leave | Payer: Self-pay

## 2020-12-15 ENCOUNTER — Other Ambulatory Visit: Payer: Self-pay

## 2020-12-15 ENCOUNTER — Encounter: Payer: Self-pay | Admitting: Emergency Medicine

## 2020-12-15 DIAGNOSIS — R55 Syncope and collapse: Secondary | ICD-10-CM

## 2020-12-15 DIAGNOSIS — R42 Dizziness and giddiness: Secondary | ICD-10-CM

## 2020-12-15 NOTE — Discharge Instructions (Addendum)
Advised/instructed patient to take blood pressure first thing in the morning after voiding and to log these measurements for the next 7 to 10 days to better understand daily blood pressure trends.  Advised/encouraged patient to take 2 blood pressure medications in the morning after voiding and prior to blood pressure checks.  Advised patient we will follow-up with CMP results once received.  Advised/instructed patient if dizziness/syncopal episodes worsen and/or unresolved please follow-up with PCP or here for further evaluation.

## 2020-12-15 NOTE — ED Provider Notes (Signed)
[name]    CSN: [id number] Arrival date & time: 12/15/20  1358      History   Chief Complaint Chief Complaint  Patient presents with   Dizziness    HPI Sarah Nicholson is a 50 y.o. female.   HPI 50 year old female presents with presyncopal episodes since 12/05/2020.  PMH significant for hypotension; although she is currently on allow Enalapril 10 mg daily, Catapres 0.1 mg daily, and hydrochlorothiazide 12.5 mg daily.  Current BP during triage was 134/74, patient reports this is close to her daily average.  Patient denies anginal equivalents, shortness of breath at rest, or mild with exertion.  Patient reports smoking 1 pack of cigarettes per day and has 35-pack-year history.  Past Medical History:  Diagnosis Date   ADD (attention deficit disorder) 06/14/2012   Anxiety    Bronchitis    Complication of anesthesia 1987   states woke up during appedentomy   Depression 06/14/2012   Hepatic steatosis 11/13/2018   Hepatic steatosis with hepatomegaly seen on MRI abd on 11/2018   History of leukocytosis 06/14/2012   Reports "WBC 32" decades ago with negative hematology specialist workup.    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   Hepatic steatosis 11/13/2018   Tobacco use 07/18/2018   Sun-induced skin changes, keratosis 08/08/2017   Subcutaneous cyst 08/08/2017   Irritable bowel syndrome (IBS) 05/04/2017   Generalized anxiety disorder 03/16/2016   Social anxiety disorder 03/16/2016   Major depressive disorder, recurrent episode, moderate ([place]) 03/16/2016   Right inguinal hernia 07/14/2015   Hallux valgus 12/09/2014   Acute headache 07/31/2014   Chest pain 07/31/2014   Hypotension 07/31/2014   Other and unspecified ovarian cyst 06/25/2013   Left tennis elbow 06/25/2013   Left breast  mass 06/27/2012   Microscopic hematuria 06/27/2012   Hyperlipidemia 06/14/2012   Prediabetes 06/14/2012   History of leukocytosis 06/14/2012   Seasonal allergies 06/14/2012   Depression 06/14/2012   ADD (attention deficit disorder) 06/14/2012    Past Surgical History:  Procedure Laterality Date   APPENDECTOMY     BICEPT TENODESIS Right 10/10/2019   Procedure: BICEPS TENODESIS;  Surgeon: Hiram Gash, MD;  Location: [facility];  Service: Orthopedics;  Laterality: Right;   bladder sling/mesh     INGUINAL HERNIA REPAIR     intrauterine ablation     NASAL SINUS SURGERY     TUBAL LIGATION      OB History   No obstetric history on file.      Home Medications    Prior to Admission medications   Medication Sig Start Date End Date Taking? Authorizing Provider  amphetamine-dextroamphetamine (ADDERALL) 10 MG tablet Take 1 tablet (10 mg total) by mouth 2 (two) times daily. Patient not taking: No sig reported 02/04/19 10/02/19  Emeterio Reeve, DO  clonazePAM (KLONOPIN) 0.5 MG tablet TAKE 1 TABLET(0.5 MG) BY MOUTH THREE TIMES DAILY AS NEEDED FOR ANXIETY 12/14/20   Breeback, Jade L, PA-C  cloNIDine (CATAPRES) 0.1 MG tablet Take 1 tablet (0.1 mg total) by mouth 3 (three) times daily as needed (systolic blood pressure >008). 03/11/20   Emeterio Reeve, DO  enalapril (VASOTEC) 10 MG tablet TAKE 1/2 TABLET(5 MG) BY MOUTH TWICE DAILY FOR 1 DAY THEN TAKE 1 TABLET(10 MG) BY MOUTH TWICE DAILY 10/05/20   Emeterio Reeve, DO  EPINEPHrine 0.3 mg/0.3 mL IJ SOAJ injection INJECT INTRAMUSCULARLY AS DIRECTED 09/21/20   Emeterio Reeve, DO  escitalopram (LEXAPRO) 20 MG tablet TAKE 2 TABLETS(40 MG) BY MOUTH DAILY 12/14/20   Breeback, Jade L, PA-C  hydrochlorothiazide (HYDRODIURIL) 12.5 MG tablet TAKE 1 TABLET(12.5 MG) BY MOUTH DAILY 07/06/20   Emeterio Reeve, DO  meloxicam (MOBIC) 15 MG tablet One tab PO qAM with a meal for 2 weeks, then daily prn pain. 12/04/20   Silverio Decamp, MD    Family History Family History  Problem Relation Age of Onset   Depression Mother    Diabetes Father    Hyperlipidemia Father    Hypertension Father    Depression Sister    Depression Son    Breast cancer Maternal Grandmother     Social History Social History   Tobacco Use   Smoking status: Every Day    Packs/day: 0.50    Types: Cigarettes   Smokeless tobacco: Never   Tobacco comments:    10-20  Vaping Use   Vaping Use: Never used  Substance Use Topics   Alcohol use: Yes    Comment: 1-2 drinks every 6 months   Drug use: No     Allergies   Macrobid [nitrofurantoin macrocrystal], Nitrofurantoin, Other, Tetanus toxoids, and Thimerosal   Review of Systems Review of Systems  Neurological:  Positive for dizziness and syncope.  All other systems reviewed and are negative.   Physical Exam Triage Vital Signs ED Triage Vitals [12/15/20 1423]  Enc Vitals Group     BP 134/74     Pulse Rate 83     Resp      Temp 98.9 F (37.2 C)     Temp Source Oral     SpO2 95 %     Weight 210 lb (95.3 kg)     Height 5\' 6"  (1.676 m)     Head Circumference      Peak Flow      Pain Score 0     Pain Loc      Pain Edu?      Excl. in [place]?    No data found.  Updated Vital Signs BP 134/74 (BP Location: Right Arm)   Pulse 83   Temp 98.9 F (37.2 C) (Oral)   Ht 5\' 6"  (1.676 m)   Wt 210 lb (95.3 kg)   SpO2 95%   BMI 33.89 kg/m     Physical Exam Vitals and nursing note reviewed.  Constitutional:      General: She is not in acute distress.    Appearance: She is normal weight. She is not ill-appearing.  HENT:     Head: Normocephalic and atraumatic.     Right Ear: Tympanic membrane, ear canal and external ear normal.     Left Ear: Tympanic membrane, ear canal and external ear normal.     Mouth/Throat:     Mouth: Mucous membranes are moist.     Pharynx: Oropharynx is clear.  Eyes:     Extraocular Movements: Extraocular movements intact.      Conjunctiva/sclera: Conjunctivae normal.     Pupils: Pupils are equal, round, and reactive to light.  Neck:     Comments: No JVD, no bruit Cardiovascular:     Rate and Rhythm: Normal rate and regular rhythm.     Pulses: Normal pulses.     Heart sounds: Normal heart sounds. No murmur heard.   No friction rub. No gallop.  Pulmonary:  Effort: Pulmonary effort is normal.     Breath sounds: Normal breath sounds.     Comments: No adventitious breath sounds noted Musculoskeletal:        General: Normal range of motion.     Cervical back: Normal range of motion and neck supple.  Skin:    General: Skin is warm and dry.  Neurological:     General: No focal deficit present.     Mental Status: She is alert and oriented to person, place, and time. Mental status is at baseline.     Cranial Nerves: No cranial nerve deficit.     Motor: No weakness.     Coordination: Coordination normal.     Gait: Gait normal.  Psychiatric:        Mood and Affect: Mood normal.        Behavior: Behavior normal.        Thought Content: Thought content normal.     UC Treatments / Results  Labs (all labs ordered are listed, but only abnormal results are displayed) Labs Reviewed  COMPLETE METABOLIC PANEL WITH GFR    EKG   Radiology No results found.  Procedures Procedures (including critical care time)  Medications Ordered in UC Medications - No data to display  Initial Impression / Assessment and Plan / UC Course  I have reviewed the triage vital signs and the nursing notes.  Pertinent labs & imaging results that were available during my care of the patient were reviewed by me and considered in my medical decision making (see chart for details).     MDM: 1.  Syncope, unspecified syncope type-EKG revealed NSR, right atrial enlargement, nonspecific ST abnormality, abnormal EKG-similar to EKG performed on 03/10/2020, CMP ordered, patient has PMH significant for hypotension and is currently on 2  blood pressure medications, with option of third (clonidine) by PCP, patient reports during COVID blood pressure increased and diagnosis was changed from hypotension to hypertension although never documented in her medical history. 2. Dizziness-Advised/instructed patient to take blood pressure first thing in the morning after voiding and to log these measurements for the next 7 to 10 days to better understand daily blood pressure trends.  Patient may need cardiac echo to rule out valvular disorder if symptoms continue.  Patient discharged home, hemodynamically stable. Final Clinical Impressions(s) / UC Diagnoses   Final diagnoses:  Syncope, unspecified syncope type  Dizziness     Discharge Instructions      Advised/instructed patient to take blood pressure first thing in the morning after voiding and to log these measurements for the next 7 to 10 days to better understand daily blood pressure trends.  Advised/encouraged patient to take 2 blood pressure medications in the morning after voiding and prior to blood pressure checks.  Advised patient we will follow-up with CMP results once received.  Advised/instructed patient if dizziness/syncopal episodes worsen and/or unresolved please follow-up with PCP or here for further evaluation.     ED Prescriptions   None    PDMP not reviewed this encounter.   Eliezer Lofts, [place] 12/15/20 [phone]

## 2020-12-15 NOTE — ED Triage Notes (Signed)
Patient states that she's having episodes of feeling like she's going to pass out.  Patient gets dizzy during these episodes.  Started on 12/05/2020, intermittent until recently, the last 4 days it's been daily.  No nausea or vomiting with the episodes.

## 2020-12-16 LAB — COMPLETE METABOLIC PANEL WITH GFR
AG Ratio: 1.4 (calc) (ref 1.0–2.5)
ALT: 11 U/L (ref 6–29)
AST: 11 U/L (ref 10–35)
Albumin: 3.9 g/dL (ref 3.6–5.1)
Alkaline phosphatase (APISO): 76 U/L (ref 37–153)
BUN/Creatinine Ratio: 22 (calc) (ref 6–22)
BUN: 27 mg/dL — ABNORMAL HIGH (ref 7–25)
CO2: 24 mmol/L (ref 20–32)
Calcium: 9.4 mg/dL (ref 8.6–10.4)
Chloride: 101 mmol/L (ref 98–110)
Creat: 1.23 mg/dL — ABNORMAL HIGH (ref 0.50–1.03)
Globulin: 2.7 g/dL (calc) (ref 1.9–3.7)
Glucose, Bld: 109 mg/dL — ABNORMAL HIGH (ref 65–99)
Potassium: 4 mmol/L (ref 3.5–5.3)
Sodium: 135 mmol/L (ref 135–146)
Total Bilirubin: 0.3 mg/dL (ref 0.2–1.2)
Total Protein: 6.6 g/dL (ref 6.1–8.1)
eGFR: 53 mL/min/{1.73_m2} — ABNORMAL LOW (ref >=60)

## 2020-12-30 ENCOUNTER — Other Ambulatory Visit: Payer: Self-pay | Admitting: Osteopathic Medicine

## 2020-12-30 DIAGNOSIS — I10 Essential (primary) hypertension: Secondary | ICD-10-CM

## 2021-01-04 ENCOUNTER — Other Ambulatory Visit: Payer: Self-pay

## 2021-01-04 ENCOUNTER — Encounter: Payer: Self-pay | Admitting: Family Medicine

## 2021-01-04 ENCOUNTER — Ambulatory Visit (INDEPENDENT_AMBULATORY_CARE_PROVIDER_SITE_OTHER): Payer: 59 | Admitting: Family Medicine

## 2021-01-04 DIAGNOSIS — R350 Frequency of micturition: Secondary | ICD-10-CM

## 2021-01-04 DIAGNOSIS — M5442 Lumbago with sciatica, left side: Secondary | ICD-10-CM | POA: Diagnosis not present

## 2021-01-04 DIAGNOSIS — M5441 Lumbago with sciatica, right side: Secondary | ICD-10-CM

## 2021-01-04 DIAGNOSIS — N12 Tubulo-interstitial nephritis, not specified as acute or chronic: Secondary | ICD-10-CM | POA: Diagnosis not present

## 2021-01-04 LAB — POCT URINALYSIS DIP (CLINITEK)
Bilirubin, UA: NEGATIVE
Glucose, UA: NEGATIVE mg/dL
Ketones, POC UA: NEGATIVE mg/dL
Leukocytes, UA: NEGATIVE
Nitrite, UA: NEGATIVE
POC PROTEIN,UA: 30 — AB
Spec Grav, UA: 1.015 (ref 1.010–1.025)
Urobilinogen, UA: 0.2 E.U./dL
pH, UA: 6 (ref 5.0–8.0)

## 2021-01-04 MED ORDER — CEFDINIR 300 MG PO CAPS: 300.0000 mg | ORAL_CAPSULE | Freq: Two times a day (BID) | ORAL | 0 refills | Status: DC

## 2021-01-04 MED ORDER — CEFTRIAXONE SODIUM 1 G IJ SOLR
1.0000 g | Freq: Once | INTRAMUSCULAR | Status: AC
  Administered 2021-01-04: 1 g via INTRAMUSCULAR

## 2021-01-04 NOTE — Progress Notes (Signed)
Sarah Nicholson - 50 y.o. female MRN [id number]  Date of birth: 10/01/1970  Subjective Chief Complaint  Patient presents with   Back Pain   Urinary Frequency    HPI Sarah Nicholson is a 50 year old female here today with complaint of urinary frequency and urgency.  She is also having back pain.  Back pain is located along the left flank area.  She reports her symptoms started approximately 1-1/2 weeks ago.  She has not had fever, chills.  ROS:  A comprehensive ROS was completed and negative except as noted per HPI  Allergies  Allergen Reactions   Macrobid [Nitrofurantoin Macrocrystal] Hives   Nitrofurantoin Hives and Rash   Other Hives    Preservative used in vaccinations   Tetanus Toxoids Swelling    Pt states she was told the Tdap injection would have to be administered in the hospital  After discussion on 11/10 pt remembers being told it was thimersol in the tetanus 30+years ago that caused the swelling.     Thimerosal Rash    Past Medical History:  Diagnosis Date   ADD (attention deficit disorder) 06/14/2012   Anxiety    Bronchitis    Complication of anesthesia 1987   states woke up during appedentomy   Depression 06/14/2012   Hepatic steatosis 11/13/2018   Hepatic steatosis with hepatomegaly seen on MRI abd on 11/2018   History of leukocytosis 06/14/2012   Reports "WBC 32" decades ago with negative hematology specialist workup.    Hyperlipidemia 06/14/2012   Seasonal allergies 06/14/2012    Past Surgical History:  Procedure Laterality Date   APPENDECTOMY     BICEPT TENODESIS Right 10/10/2019   Procedure: BICEPS TENODESIS;  Surgeon: Hiram Gash, MD;  Location: [facility];  Service: Orthopedics;  Laterality: Right;   bladder sling/mesh     INGUINAL HERNIA REPAIR     intrauterine ablation     NASAL SINUS SURGERY     TUBAL LIGATION      Social History   Socioeconomic History   Marital status: Married    Spouse name: Not on file   Number of children: Not  on file   Years of education: Not on file   Highest education level: Not on file  Occupational History   Not on file  Tobacco Use   Smoking status: Every Day    Packs/day: 0.50    Types: Cigarettes   Smokeless tobacco: Never   Tobacco comments:    10-20  Vaping Use   Vaping Use: Never used  Substance and Sexual Activity   Alcohol use: Yes    Comment: 1-2 drinks every 6 months   Drug use: No   Sexual activity: Not on file  Other Topics Concern   Not on file  Social History Narrative   Not on file   Social Determinants of Health   Financial Resource Strain: Not on file  Food Insecurity: Not on file  Transportation Needs: Not on file  Physical Activity: Not on file  Stress: Not on file  Social Connections: Not on file    Family History  Problem Relation Age of Onset   Depression Mother    Diabetes Father    Hyperlipidemia Father    Hypertension Father    Depression Sister    Depression Son    Breast cancer Maternal Grandmother     Health Maintenance  Topic Date Due   COVID-19 Vaccine (4 - Booster for Moderna series) 06/05/2020   INFLUENZA VACCINE  10/12/2020   PAP SMEAR-Modifier  03/05/2021   Zoster Vaccines- Shingrix (1 of 2) 01/06/2021 (Originally 01/01/1988)   COLONOSCOPY (Pts 45-73yrs Insurance coverage will need to be confirmed)  02/27/2021 (Originally 12/31/2013)   Hepatitis C Screening  02/27/2021 (Originally 01/01/1987)   HIV Screening  02/27/2021 (Originally 01/01/1984)   MAMMOGRAM  05/14/2021 (Originally 03/29/2020)   Pneumococcal Vaccine 28-75 Years old (2 - PCV) 10/06/2021 (Originally 03/15/2011)   TETANUS/TDAP  01/22/2023   HPV VACCINES  Aged Out     ----------------------------------------------------------------------------------------------------------------------------------------------------------------------------------------------------------------- Physical Exam BP (!) 150/75 (BP Location: Left Arm, Patient Position: Sitting, Cuff Size:  Normal)   Pulse 73   Temp 98 F (36.7 C)   Ht 5\' 6"  (1.676 m)   Wt 216 lb (98 kg)   SpO2 96%   BMI 34.86 kg/m   Physical Exam Constitutional:      Appearance: Normal appearance.  HENT:     Head: Normocephalic and atraumatic.  Cardiovascular:     Rate and Rhythm: Normal rate and regular rhythm.  Pulmonary:     Effort: Pulmonary effort is normal.     Breath sounds: Normal breath sounds.  Abdominal:     Tenderness: There is left CVA tenderness. There is no right CVA tenderness.  Musculoskeletal:     Cervical back: Neck supple.  Neurological:     General: No focal deficit present.     Mental Status: She is alert.  Psychiatric:        Mood and Affect: Mood normal.        Behavior: Behavior normal.    ------------------------------------------------------------------------------------------------------------------------------------------------------------------------------------------------------------------- Assessment and Plan  Pyelonephritis She was given injection of Rocephin 1 g today.  She will be continued on cefdinir to complete a 10-day course.  Recommend increase fluid intake.  Contact clinic for worsening symptoms.  If symptoms persist we can consider imaging   Meds ordered this encounter  Medications   cefdinir (OMNICEF) 300 MG capsule    Sig: Take 1 capsule (300 mg total) by mouth 2 (two) times daily.    Dispense:  20 capsule    Refill:  0   cefTRIAXone (ROCEPHIN) injection 1 g    No follow-ups on file.    This visit occurred during the SARS-CoV-2 public health emergency.  Safety protocols were in place, including screening questions prior to the visit, additional usage of staff PPE, and extensive cleaning of exam room while observing appropriate contact time as indicated for disinfecting solutions.

## 2021-01-04 NOTE — Patient Instructions (Signed)
Pyelonephritis, Adult Pyelonephritis is an infection that occurs in the kidney. The kidneys are the organs that filter a person's blood and move waste out of the bloodstream and into the urine. Urine passes from the kidneys, through tubes called ureters, and into the bladder. There are two main types of pyelonephritis: Infections that come on quickly without any warning (acute pyelonephritis). Infections that last for a long period of time (chronic pyelonephritis). In most cases, the infection clears up with treatment and does not cause further problems. More severe infections or chronic infections can sometimes spread to the bloodstream or lead to other problems with the kidneys. What are the causes? This condition is usually caused by: Bacteria traveling from the bladder up to the kidney. This may occur after having a bladder infection (cystitis) or urinary tract infection (UTI). Bladder infections caused from bacteria traveling from the bloodstream to the kidney. What increases the risk? This condition is more likely to develop in: Pregnant women. Older people. People who have any of these conditions: Diabetes. Inflammation of the prostate gland (prostatitis), in males. Kidney stones or bladder stones. Other abnormalities of the kidney or ureter. Cancer. People who have a catheter placed in the bladder. People who are sexually active. Women who use spermicides. People who have had a prior UTI. What are the signs or symptoms? Symptoms of this condition include: Frequent urination. Strong or persistent urge to urinate. Burning or stinging when urinating. Abdominal pain. Back pain. Pain in the side or flank area. Fever or chills. Blood in the urine, or dark urine. Nausea or vomiting. How is this diagnosed? This condition may be diagnosed based on: Your medical history and a physical exam. Urine tests. Blood tests. You may also have imaging tests of the kidneys, such as an  ultrasound or CT scan. How is this treated? Treatment for this condition may depend on the severity of the infection. If the infection is mild and is found early, you may be treated with antibiotic medicines taken by mouth (orally). You will need to drink fluids to remain hydrated. If the infection is more severe, you may need to stay in the hospital and receive antibiotics given directly into a vein through an IV. You may also need to receive fluids through an IV if you are not able to remain hydrated. After your hospital stay, you may need to take oral antibiotics for a period of time. Other treatments may be required, depending on the cause of the infection. Follow these instructions at home: Medicines Take your antibiotic medicine as told by your health care provider. Do not stop taking the antibiotic even if you start to feel better. Take over-the-counter and prescription medicines only as told by your health care provider. General instructions  Drink enough fluid to keep your urine pale yellow. Avoid caffeine, tea, and carbonated beverages. They tend to irritate the bladder. Urinate often. Avoid holding in urine for long periods of time. Urinate before and after sex. After a bowel movement, women should cleanse from front to back. Use each tissue only once. Keep all follow-up visits as told by your health care provider. This is important. Contact a health care provider if: Your symptoms do not get better after 2 days of treatment. Your symptoms get worse. You have a fever. Get help right away if you: Are unable to take your antibiotics or fluids. Have shaking chills. Vomit. Have severe flank or back pain. Have extreme weakness or fainting. Summary Pyelonephritis is a urinary tract infection (  UTI) that occurs in the kidney. Treatment for this condition may depend on the severity of the infection. Take your antibiotic medicine as told by your health care provider. Do not stop  taking the antibiotic even if you start to feel better. Drink enough fluid to keep your urine pale yellow. Keep all follow-up visits as told by your health care provider. This is important. This information is not intended to replace advice given to you by your health care provider. Make sure you discuss any questions you have with your health care provider. Document Revised: 01/02/2018 Document Reviewed: 01/02/2018 Elsevier Patient Education  2022 [employer].

## 2021-01-04 NOTE — Assessment & Plan Note (Signed)
She was given injection of Rocephin 1 g today.  She will be continued on cefdinir to complete a 10-day course.  Recommend increase fluid intake.  Contact clinic for worsening symptoms.  If symptoms persist we can consider imaging

## 2021-01-06 LAB — URINE CULTURE
MICRO NUMBER:: 12543935
Result:: NO GROWTH
SPECIMEN QUALITY:: ADEQUATE

## 2021-01-15 ENCOUNTER — Other Ambulatory Visit: Payer: Self-pay

## 2021-01-15 ENCOUNTER — Ambulatory Visit (INDEPENDENT_AMBULATORY_CARE_PROVIDER_SITE_OTHER): Payer: 59

## 2021-01-15 ENCOUNTER — Ambulatory Visit (INDEPENDENT_AMBULATORY_CARE_PROVIDER_SITE_OTHER): Payer: 59 | Admitting: Sports Medicine

## 2021-01-15 VITALS — BP 136/89 | HR 77 | Temp 98.0°F

## 2021-01-15 DIAGNOSIS — M47816 Spondylosis without myelopathy or radiculopathy, lumbar region: Secondary | ICD-10-CM

## 2021-01-15 DIAGNOSIS — R109 Unspecified abdominal pain: Secondary | ICD-10-CM | POA: Diagnosis not present

## 2021-01-15 DIAGNOSIS — M7062 Trochanteric bursitis, left hip: Secondary | ICD-10-CM

## 2021-01-15 DIAGNOSIS — R1012 Left upper quadrant pain: Secondary | ICD-10-CM

## 2021-01-15 DIAGNOSIS — M545 Low back pain, unspecified: Secondary | ICD-10-CM

## 2021-01-15 DIAGNOSIS — D72825 Bandemia: Secondary | ICD-10-CM

## 2021-01-15 MED ORDER — PREDNISONE 50 MG PO TABS: ORAL_TABLET | ORAL | 0 refills | Status: DC

## 2021-01-15 MED ORDER — MAGNESIUM OXIDE 400 MG PO TABS: 800.0000 mg | ORAL_TABLET | Freq: Every day | ORAL | 3 refills | Status: DC

## 2021-01-15 NOTE — Assessment & Plan Note (Signed)
In addition Sarah Nicholson is having low back pain, left-sided over the quadratus lumborum, cramping in both legs. Adding 5 days of prednisone, lumbar spine x-rays, magnesium oxide at night. Return to see me in a couple of weeks, we will consider more advanced lumbar spine imaging if not better.

## 2021-01-15 NOTE — Progress Notes (Addendum)
    Procedures performed today:    None.  Independent interpretation of notes and tests performed by another provider:   None.  Brief History, Exam, Impression, and Recommendations:    Acute abdominal pain Pleasant 50 year old female, she has been having increasing left flank pain, as well as left upper abdominal pain. History of chronic hematuria. History of nephrolithiasis. Pain does tend to radiate to the anterior/lower abdomen. I would also like a stone protocol CT.   Lumbar spondylosis In addition Sarah Nicholson is having low back pain, left-sided over the quadratus lumborum, cramping in both legs. Adding 5 days of prednisone, lumbar spine x-rays, magnesium oxide at night. Return to see me in a couple of weeks, we will consider more advanced lumbar spine imaging if not better.  Trochanteric bursitis, left hip The good news is that her trochanteric bursitis has resolved completely after injection in September. Return as needed for this.  Leukocytosis Patient has transitioned to me for primary care, noted leukocytosis on CBC today and over the past 4 years, neutrophilia, adding pathology smear review.    ___________________________________________ [name], M.D., ABFM., CAQSM. Primary Care and [place] Instructor of [place] of [facility] of [employer]

## 2021-01-15 NOTE — Assessment & Plan Note (Signed)
The good news is that her trochanteric bursitis has resolved completely after injection in September. Return as needed for this.

## 2021-01-15 NOTE — Assessment & Plan Note (Signed)
Pleasant 50 year old female, she has been having increasing left flank pain, as well as left upper abdominal pain. History of chronic hematuria. History of nephrolithiasis. Pain does tend to radiate to the anterior/lower abdomen. I would also like a stone protocol CT.

## 2021-01-16 DIAGNOSIS — D72829 Elevated white blood cell count, unspecified: Secondary | ICD-10-CM | POA: Insufficient documentation

## 2021-01-16 LAB — CBC WITH DIFFERENTIAL/PLATELET
Absolute Monocytes: 915 cells/uL (ref 200–950)
Basophils Absolute: 86 cells/uL (ref 0–200)
Basophils Relative: 0.6 %
Eosinophils Absolute: 114 cells/uL (ref 15–500)
Eosinophils Relative: 0.8 %
HCT: 42.1 % (ref 35.0–45.0)
Hemoglobin: 14.4 g/dL (ref 11.7–15.5)
Lymphs Abs: 4590 cells/uL — ABNORMAL HIGH (ref 850–3900)
MCH: 30.4 pg (ref 27.0–33.0)
MCHC: 34.2 g/dL (ref 32.0–36.0)
MCV: 88.8 fL (ref 80.0–100.0)
MPV: 9.4 fL (ref 7.5–12.5)
Monocytes Relative: 6.4 %
Neutro Abs: 8594 cells/uL — ABNORMAL HIGH (ref 1500–7800)
Neutrophils Relative %: 60.1 %
Platelets: 376 10*3/uL (ref 140–400)
RBC: 4.74 10*6/uL (ref 3.80–5.10)
RDW: 12.5 % (ref 11.0–15.0)
Total Lymphocyte: 32.1 %
WBC: 14.3 10*3/uL — ABNORMAL HIGH (ref 3.8–10.8)

## 2021-01-16 LAB — COMPREHENSIVE METABOLIC PANEL
AG Ratio: 1.5 (calc) (ref 1.0–2.5)
ALT: 12 U/L (ref 6–29)
AST: 11 U/L (ref 10–35)
Albumin: 3.8 g/dL (ref 3.6–5.1)
Alkaline phosphatase (APISO): 66 U/L (ref 37–153)
BUN: 15 mg/dL (ref 7–25)
CO2: 25 mmol/L (ref 20–32)
Calcium: 9.1 mg/dL (ref 8.6–10.4)
Chloride: 107 mmol/L (ref 98–110)
Creat: 0.9 mg/dL (ref 0.50–1.03)
Globulin: 2.6 g/dL (calc) (ref 1.9–3.7)
Glucose, Bld: 78 mg/dL (ref 65–99)
Potassium: 4 mmol/L (ref 3.5–5.3)
Sodium: 139 mmol/L (ref 135–146)
Total Bilirubin: 0.3 mg/dL (ref 0.2–1.2)
Total Protein: 6.4 g/dL (ref 6.1–8.1)

## 2021-01-16 LAB — AMYLASE: Amylase: 35 U/L (ref 21–101)

## 2021-01-16 LAB — URINALYSIS W MICROSCOPIC + REFLEX CULTURE
Bacteria, UA: NONE SEEN /HPF
Bilirubin Urine: NEGATIVE
Glucose, UA: NEGATIVE
Hyaline Cast: NONE SEEN /LPF
Ketones, ur: NEGATIVE
Leukocyte Esterase: NEGATIVE
Nitrites, Initial: NEGATIVE
Protein, ur: NEGATIVE
Specific Gravity, Urine: 1.008 (ref 1.001–1.035)
WBC, UA: NONE SEEN /HPF (ref 0–5)
pH: 6 (ref 5.0–8.0)

## 2021-01-16 LAB — LIPASE: Lipase: 24 U/L (ref 7–60)

## 2021-01-16 LAB — NO CULTURE INDICATED

## 2021-01-16 NOTE — Assessment & Plan Note (Signed)
Patient has transitioned to me for primary care, noted leukocytosis on CBC today and over the past 4 years, neutrophilia, adding pathology smear review.

## 2021-01-16 NOTE — Addendum Note (Signed)
Addended by: Silverio Decamp on: 01/16/2021 01:50 PM   Modules accepted: Orders

## 2021-01-19 ENCOUNTER — Ambulatory Visit (INDEPENDENT_AMBULATORY_CARE_PROVIDER_SITE_OTHER): Payer: 59

## 2021-01-19 ENCOUNTER — Other Ambulatory Visit: Payer: Self-pay

## 2021-01-19 DIAGNOSIS — R1012 Left upper quadrant pain: Secondary | ICD-10-CM | POA: Diagnosis not present

## 2021-01-19 DIAGNOSIS — R319 Hematuria, unspecified: Secondary | ICD-10-CM

## 2021-01-19 DIAGNOSIS — R109 Unspecified abdominal pain: Secondary | ICD-10-CM

## 2021-01-27 ENCOUNTER — Ambulatory Visit (INDEPENDENT_AMBULATORY_CARE_PROVIDER_SITE_OTHER): Payer: 59 | Admitting: Sports Medicine

## 2021-01-27 DIAGNOSIS — D72825 Bandemia: Secondary | ICD-10-CM

## 2021-01-27 DIAGNOSIS — Z720 Tobacco use: Secondary | ICD-10-CM

## 2021-01-27 DIAGNOSIS — M47816 Spondylosis without myelopathy or radiculopathy, lumbar region: Secondary | ICD-10-CM | POA: Diagnosis not present

## 2021-01-27 DIAGNOSIS — R109 Unspecified abdominal pain: Secondary | ICD-10-CM | POA: Diagnosis not present

## 2021-01-27 MED ORDER — VARENICLINE TARTRATE 1 MG PO TABS
1.0000 mg | ORAL_TABLET | Freq: Two times a day (BID) | ORAL | 3 refills | Status: DC
Start: 2021-01-27 — End: 2021-12-21

## 2021-01-27 MED ORDER — CHANTIX STARTING MONTH PAK 0.5 MG X 11 & 1 MG X 42 PO TBPK: 1.0000 | ORAL_TABLET | Freq: Every day | ORAL | 0 refills | Status: DC

## 2021-01-27 NOTE — Assessment & Plan Note (Signed)
We will retry Chantix.

## 2021-01-27 NOTE — Progress Notes (Addendum)
    Procedures performed today:    None.  Independent interpretation of notes and tests performed by another provider:   None.  Brief History, Exam, Impression, and Recommendations:    Left lateral abdominal pain This is a pleasant 50 year old female, increasing left flank pain, left upper abdominal pain, history of chronic hematuria and nephrolithiasis, we did some labs and obtained a CT stone protocol, all of which were for those part unrevealing. She does have a history of IBS, I suspect this may be at play here, certainly splenic flexure syndrome is also a possibility, I would like gastroenterology to weigh in. The addition of Linzess low-dose may be beneficial.  Lumbar spondylosis Sarah Nicholson was also having low back pain, this is been present for greater than 6 weeks in spite of physician directed conservative treatment, pain is predominately left-sided, localized predominantly at the quadratus lumborum with cramping in both legs, prednisone not efficacious, we did add magnesium oxide which has helped her nocturnal cramping, continues to have pain radiating over the anterolateral thighs suspicious for L4 radiculitis, at this point we will proceed with MRI for epidural planning.  Tobacco use We will retry Chantix.  Leukocytosis Update 02/16/2021: Persistent leukocytosis present for the past several years, sounds like decades ago it was as high as 32,000 and she did have an evaluation from hematology that was unrevealing, I do not have this for my review, up to 20,000 after an urgent care visit, I would like to add a pathologist smear review and I would like her to touch base with hematology as well.    ___________________________________________ [name], M.D., ABFM., CAQSM. Primary Care and [place] Instructor of [place] of [facility] of Medicine

## 2021-01-27 NOTE — Assessment & Plan Note (Signed)
This is a pleasant 50 year old female, increasing left flank pain, left upper abdominal pain, history of chronic hematuria and nephrolithiasis, we did some labs and obtained a CT stone protocol, all of which were for those part unrevealing. She does have a history of IBS, I suspect this may be at play here, certainly splenic flexure syndrome is also a possibility, I would like gastroenterology to weigh in. The addition of Linzess low-dose may be beneficial.

## 2021-01-27 NOTE — Assessment & Plan Note (Signed)
Sarah Nicholson was also having low back pain, this is been present for greater than 6 weeks in spite of physician directed conservative treatment, pain is predominately left-sided, localized predominantly at the quadratus lumborum with cramping in both legs, prednisone not efficacious, we did add magnesium oxide which has helped her nocturnal cramping, continues to have pain radiating over the anterolateral thighs suspicious for L4 radiculitis, at this point we will proceed with MRI for epidural planning.

## 2021-02-11 ENCOUNTER — Telehealth (INDEPENDENT_AMBULATORY_CARE_PROVIDER_SITE_OTHER): Payer: 59 | Admitting: Family Medicine

## 2021-02-11 ENCOUNTER — Other Ambulatory Visit: Payer: Self-pay

## 2021-02-11 ENCOUNTER — Encounter: Payer: Self-pay | Admitting: Family Medicine

## 2021-02-11 VITALS — BP 176/104 | HR 72

## 2021-02-11 DIAGNOSIS — U071 COVID-19: Secondary | ICD-10-CM | POA: Diagnosis not present

## 2021-02-11 MED ORDER — GUAIFENESIN ER 600 MG PO TB12: 1200.0000 mg | ORAL_TABLET | Freq: Two times a day (BID) | ORAL | 2 refills | Status: DC

## 2021-02-11 MED ORDER — BENZONATATE 200 MG PO CAPS: 200.0000 mg | ORAL_CAPSULE | Freq: Two times a day (BID) | ORAL | 1 refills | Status: DC | PRN

## 2021-02-11 MED ORDER — PREDNISONE 20 MG PO TABS: 40.0000 mg | ORAL_TABLET | Freq: Every day | ORAL | 0 refills | Status: AC

## 2021-02-11 NOTE — Patient Instructions (Signed)
Over the counter medications that may be helpful for symptoms:  Guaifenesin 1200 mg extended release tabs twice daily, with plenty of water For cough and congestion Brand name: Mucinex   Pseudoephedrine 30 mg, one or two tabs every 4 to 6 hours For sinus congestion Brand name: Sudafed You must get this from the pharmacy counter.  Oxymetazoline nasal spray each morning, one spray in each nostril, for NO MORE THAN 3 days  For nasal and sinus congestion Brand name: Afrin Saline nasal spray or Saline Nasal Irrigation 3-5 times a day For nasal and sinus congestion Brand names: Ocean or AYR Fluticasone nasal spray, one spray in each nostril, each morning (after oxymetazoline and saline, if used) For nasal and sinus congestion Brand name: Flonase Warm salt water gargles  For sore throat Every few hours as needed Alternate ibuprofen 400-600 mg and acetaminophen 1000 mg every 4-6 hours For fever, body aches, headache Brand names: Motrin or Advil and Tylenol Dextromethorphan 12-hour cough version 30 mg every 12 hours  For cough Brand name: Delsym Stop all other cold medications for now (Nyquil, Dayquil, Tylenol Cold, Theraflu, etc) and other non-prescription cough/cold preparations. Many of these have the same ingredients listed above and could cause an overdose of medication.   Herbal treatments that have been shown to be helpful in some patients include: Vitamin C 1000mg per day Vitamin D 4000iU per day Zinc 100mg per day Quercetin 25-500mg twice a day Melatonin 5-10mg at bedtime  General Instructions Allow your body to rest Drink PLENTY of fluids Isolate yourself from everyone, even family, until test results have returned  If your COVID-19 test is positive Then you ARE INFECTED and you can pass the virus to others You must quarantine from others for a minimum of  5 days since symptoms started AND You are fever free for 24 hours WITHOUT any medication to reduce fever AND Your  symptoms are improving Wear mask for the next 5 days If not improved by day 5, quarantine for the full 10 days. Do not go to the store or other public areas Do not go around household members who are not known to be infected with COVID-19 If you MUST leave your area of quarantine (example: go to a bathroom you share with others in your home), you must Wear a mask Wash your hands thoroughly Wipe down any surfaces you touch Do not share food, drinks, towels, or other items with other persons Dispose of your own tissues, food containers, etc  Once you have recovered, please continue good preventive care measures, including:  wearing a mask when in public wash your hands frequently avoid touching your face/nose/eyes cover coughs/sneezes with the inside of your elbow stay out of crowds keep a 6 foot distance from others  If you develop severe shortness of breath, uncontrolled fevers, coughing up blood, confusion, chest pain, or signs of dehydration (such as significantly decreased urine amounts or dizziness with standing) please go to the ER.  

## 2021-02-11 NOTE — Progress Notes (Signed)
Virtual Video Visit via MyChart Note  I connected with  Sarah Nicholson on 02/11/21 at  4:20 PM EST by the video enabled telemedicine application for MyChart, and verified that I am speaking with the correct person using two identifiers.   I introduced myself as a Designer, jewellery with the practice. We discussed the limitations of evaluation and management by telemedicine and the availability of in person appointments. The patient expressed understanding and agreed to proceed.  Participating parties in this visit include: The patient, spouse, and the nurse practitioner listed.  The patient is: At home I am: In the office - [facility]  Subjective:    CC:  Chief Complaint  Patient presents with   Covid Positive    HPI: Sarah Nicholson is a 50 y.o. year old female presenting today via [facility] today for COVID+.  About 6 days ago, on Saturday, patient started feeling poorly.  She has been experiencing body aches, fatigue, malaise, fever, chills, productive cough with yellow sputum, chest soreness/tightness from coughing, headache, sneezing, loss of taste/smell.  She does report some occasional faint wheezing at nighttime.  She denies any dyspnea, sinus pressure, nausea, vomiting, diarrhea.  She tested positive for COVID on Tuesday.  She has been taking DayQuil, NyQuil, TheraFlu, ibuprofen and with minimal improvement.  States her worst symptom is the cough.  Her husband is also positive.  And she was around her father-in-law with similar symptoms prior to her feeling poorly.  She is vaccinated against COVID.   Past medical history, Surgical history, Family history not pertinant except as noted below, Social history, Allergies, and medications have been entered into the medical record, reviewed, and corrections made.   Review of Systems:  All review of systems negative except what is listed in the HPI   Objective:    General:  Speaking clearly in complete  sentences. Absent shortness of breath noted.   Alert and oriented x3.   Normal judgment.  Absent acute distress.   Impression and Recommendations:    1. COVID-19 Patient is technically out of the window for antiviral therapy given symptoms have lasted more than 5 days.  Patient is understanding and agreeable to try a prednisone burst, and supportive measures including Mucinex and Tessalon Perles for cough.  States she is getting adequate nighttime relief with DayQuil/NyQuil and does not need any stronger cough syrup.  BP was elevated at home, recommend she recheck this evening after resting for a while.  If remains elevated please let us know.  Patient is aware of signs symptoms requiring further evaluation. Continue supportive measures including rest, hydration, humidifier use, steam showers, warm compresses to sinuses, warm liquids with lemon and honey, and over-the-counter cough, cold, and analgesics as needed.   - predniSONE (DELTASONE) 20 MG tablet; Take 2 tablets (40 mg total) by mouth daily with breakfast for 5 days.  Dispense: 10 tablet; Refill: 0 - guaiFENesin (MUCINEX) 600 MG 12 hr tablet; Take 2 tablets (1,200 mg total) by mouth 2 (two) times daily.  Dispense: 30 tablet; Refill: 2 - benzonatate (TESSALON) 200 MG capsule; Take 1 capsule (200 mg total) by mouth 2 (two) times daily as needed for cough.  Dispense: 20 capsule; Refill: 1   Follow-up if symptoms worsen or fail to improve.    I discussed the assessment and treatment plan with the patient. The patient was provided an opportunity to ask questions and all were answered. The patient agreed with the plan and demonstrated an understanding  of the instructions.   The patient was advised to call back or seek an in-person evaluation if the symptoms worsen or if the condition fails to improve as anticipated.  I spent 20 minutes dedicated to the care of this patient on the date of this encounter to include pre-visit chart review of  prior notes and results, face-to-face time with the patient, and post-visit ordering of testing as indicated.   Terrilyn Saver, NP

## 2021-02-15 ENCOUNTER — Other Ambulatory Visit: Payer: Self-pay

## 2021-02-15 ENCOUNTER — Emergency Department (INDEPENDENT_AMBULATORY_CARE_PROVIDER_SITE_OTHER)
Admission: EM | Admit: 2021-02-15 | Discharge: 2021-02-15 | Disposition: A | Discharge status: Home / Self Care | Payer: 59 | Source: Home / Self Care | Attending: Family Medicine | Admitting: Family Medicine

## 2021-02-15 DIAGNOSIS — L299 Pruritus, unspecified: Secondary | ICD-10-CM

## 2021-02-15 HISTORY — DX: Essential (primary) hypertension: I10

## 2021-02-15 NOTE — ED Triage Notes (Addendum)
Pt st she is itchy all over. Pt st she has never had chicken pox, or shingles. Pt st she has no rash but she is just extremely itchy on her arms, legs  and back. " It feels like popping under my skin or like my skin is on fire. "  Pt st she tested pos for Covid on Tuesday last week. Pt st she is taking zyrtec and Pepcid. Pt was prescribed prednisone/ pearls/ and musinex for Covid on Thursday. Pt st she has taken all of thee medications before with no reaction.  No new laundry detergents, housing arrangements ect.

## 2021-02-15 NOTE — Discharge Instructions (Signed)
Make sure you use a good lotion I am doing blood work.  You can test results via MyChart Follow-up with Dr. Dianah Field For the itching, take antihistamines.  Take two Claritin or Zyrtec in the morning, and one or two 25 mg Benadryl at night

## 2021-02-15 NOTE — ED Provider Notes (Signed)
[name]    CSN: [id number] Arrival date & time: 02/15/21  1544      History   Chief Complaint Chief Complaint  Patient presents with   Pruritis    HPI Sarah Nicholson is a 50 y.o. female.   HPI Patient was diagnosed with COVID on Tuesday of last week.  Its been more than 10 days now.  She was taking Tessalon for the COVID, is also on prednisone, Mucinex DM.  She is taking already's report.  She does not think she is allergic to any of them.  She is here for severe pruritus.  She states that her skin feels like it is burning.  She states this all over her body.  No rash has been noted.  No shortness of breath.  No difficulty speaking or swallowing.  He has never had this reaction before.  Past Medical History:  Diagnosis Date   ADD (attention deficit disorder) 06/14/2012   Anxiety    Bronchitis    Complication of anesthesia 1987   states woke up during appedentomy   Depression 06/14/2012   Hepatic steatosis 11/13/2018   Hepatic steatosis with hepatomegaly seen on MRI abd on 11/2018   History of leukocytosis 06/14/2012   Reports "WBC 32" decades ago with negative hematology specialist workup.    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   Hepatic steatosis 11/13/2018   Tobacco use 07/18/2018   Sun-induced skin changes, keratosis 08/08/2017   Subcutaneous cyst 08/08/2017   Irritable bowel syndrome (IBS) 05/04/2017   Generalized anxiety disorder 03/16/2016   Social anxiety disorder 03/16/2016   Major depressive disorder, recurrent episode, 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/16/2016   Right inguinal hernia 07/14/2015   Hallux valgus 12/09/2014    Acute headache 07/31/2014   Chest pain 07/31/2014   Hypotension 07/31/2014   Other and unspecified ovarian cyst 06/25/2013   Left tennis elbow 06/25/2013   Left breast mass 06/27/2012   Microscopic hematuria 06/27/2012   Hyperlipidemia 06/14/2012   Prediabetes 06/14/2012   History of leukocytosis 06/14/2012   Seasonal allergies 06/14/2012   Depression 06/14/2012   ADD (attention deficit disorder) 06/14/2012    Past Surgical History:  Procedure Laterality Date   APPENDECTOMY     BICEPT TENODESIS Right 10/10/2019   Procedure: BICEPS TENODESIS;  Surgeon: Hiram Gash, MD;  Location: [facility];  Service: Orthopedics;  Laterality: Right;   bladder sling/mesh     INGUINAL HERNIA REPAIR     intrauterine ablation     NASAL SINUS SURGERY     TUBAL LIGATION      OB History     Gravida  5   Para  4   Term  2   Preterm  2   AB  1   Living  3      SAB      IAB      Ectopic      Multiple      Live Births               Home Medications    Prior to Admission medications   Medication Sig Start Date End Date Taking? Authorizing Provider  benzonatate (TESSALON) 200  MG capsule Take 1 capsule (200 mg total) by mouth 2 (two) times daily as needed for cough. 02/11/21   Terrilyn Saver, NP  clonazePAM (KLONOPIN) 0.5 MG tablet TAKE 1 TABLET(0.5 MG) BY MOUTH THREE TIMES DAILY AS NEEDED FOR ANXIETY 12/14/20   Breeback, Jade L, PA-C  cloNIDine (CATAPRES) 0.1 MG tablet Take 1 tablet (0.1 mg total) by mouth 3 (three) times daily as needed (systolic blood pressure >825). 03/11/20   Emeterio Reeve, DO  cyclobenzaprine (FLEXERIL) 10 MG tablet Take 10 mg by mouth 3 (three) times daily as needed for muscle spasms.    [provider]  enalapril (VASOTEC) 10 MG tablet TAKE 1/2 TABLET(5 MG) BY MOUTH TWICE DAILY FOR 1 DAY THEN TAKE 1 TABLET(10 MG) BY MOUTH TWICE DAILY 12/31/20   Silverio Decamp, MD  EPINEPHrine 0.3 mg/0.3 mL IJ SOAJ injection INJECT  INTRAMUSCULARLY AS DIRECTED 09/21/20   Emeterio Reeve, DO  escitalopram (LEXAPRO) 20 MG tablet TAKE 2 TABLETS(40 MG) BY MOUTH DAILY 12/14/20   Breeback, Jade L, PA-C  guaiFENesin (MUCINEX) 600 MG 12 hr tablet Take 2 tablets (1,200 mg total) by mouth 2 (two) times daily. 02/11/21   Terrilyn Saver, NP  hydrochlorothiazide (HYDRODIURIL) 12.5 MG tablet TAKE 1 TABLET(12.5 MG) BY MOUTH DAILY 12/31/20   Silverio Decamp, MD  predniSONE (DELTASONE) 20 MG tablet Take 2 tablets (40 mg total) by mouth daily with breakfast for 5 days. 02/11/21 02/16/21  Terrilyn Saver, NP  varenicline (CHANTIX) 1 MG tablet Take 1 tablet (1 mg total) by mouth 2 (two) times daily. 01/27/21   Silverio Decamp, MD  Varenicline Tartrate, Starter, (CHANTIX STARTING MONTH PAK) 0.5 MG X 11 & 1 MG X 42 TBPK Take 1 each by mouth daily. Take one 0.5 mg tablet by mouth once daily for 3 days, then increase to one 0.5 mg tablet twice daily for 4 days, then increase to one 1 mg tablet twice daily. 01/27/21 02/26/21  Silverio Decamp, MD    Family History Family History  Problem Relation Age of Onset   Depression Mother    Diabetes Father    Hyperlipidemia Father    Hypertension Father    Depression Sister    Breast cancer Maternal Grandmother    Depression Son     Social History Social History   Tobacco Use   Smoking status: Every Day    Packs/day: 0.50    Types: Cigarettes   Smokeless tobacco: Never   Tobacco comments:    30 years   Vaping Use   Vaping Use: Never used  Substance Use Topics   Alcohol use: Yes    Comment: 1-2 drinks every 6 months   Drug use: No     Allergies   Macrobid [nitrofurantoin macrocrystal], Macrobid [nitrofurantoin], Other, Tetanus toxoids, and Thimerosal   Review of Systems Review of Systems See HPI  Physical Exam Triage Vital Signs ED Triage Vitals  Enc Vitals Group     BP 02/15/21 1620 (!) 161/100     Pulse Rate 02/15/21 1620 70     Resp 02/15/21 1620 18      Temp 02/15/21 1620 98.9 F (37.2 C)     Temp Source 02/15/21 1620 Oral     SpO2 02/15/21 1620 95 %     Weight 02/15/21 1613 216 lb (98 kg)     Height 02/15/21 1613 5\' 6"  (1.676 m)     Head Circumference --      Peak Flow --  Pain Score 02/15/21 1613 2     Pain Loc --      Pain Edu? --      Excl. in [place]? --    No data found.  Updated Vital Signs BP (!) 161/100 (BP Location: Right Arm)   Pulse 70   Temp 98.9 F (37.2 C) (Oral)   Resp 18   Ht 5\' 6"  (1.676 m)   Wt 98 kg   SpO2 95%   BMI 34.86 kg/m      Physical Exam Constitutional:      General: She is not in acute distress.    Appearance: She is well-developed. She is obese.  HENT:     Head: Normocephalic and atraumatic.     Right Ear: Tympanic membrane and ear canal normal.     Left Ear: Tympanic membrane and ear canal normal.     Nose: Nose normal. No congestion.     Mouth/Throat:     Pharynx: No posterior oropharyngeal erythema.  Eyes:     Conjunctiva/sclera: Conjunctivae normal.     Pupils: Pupils are equal, round, and reactive to light.  Cardiovascular:     Rate and Rhythm: Normal rate and regular rhythm.  Pulmonary:     Effort: Pulmonary effort is normal. No respiratory distress.     Breath sounds: Normal breath sounds.  Abdominal:     General: There is no distension.     Palpations: Abdomen is soft.  Musculoskeletal:        General: Normal range of motion.     Cervical back: Normal range of motion.  Skin:    General: Skin is warm and dry.     Comments: No rash identified.  Skin is clear  Neurological:     Mental Status: She is alert.  Psychiatric:        Mood and Affect: Mood normal.        Behavior: Behavior normal.     UC Treatments / Results  Labs (all labs ordered are listed, but only abnormal results are displayed) Labs Reviewed  COMPLETE METABOLIC PANEL WITH GFR  CBC WITH DIFFERENTIAL/PLATELET  TSH  VITAMIN D 25 HYDROXY (VIT D DEFICIENCY, FRACTURES)  VITAMIN B12     EKG   Radiology No results found.  Procedures Procedures (including critical care time)  Medications Ordered in UC Medications - No data to display  Initial Impression / Assessment and Plan / UC Course  I have reviewed the triage vital signs and the nursing notes.  Pertinent labs & imaging results that were available during my care of the patient were reviewed by me and considered in my medical decision making (see chart for details).     Patient works at a pharmacy.  The pharmacist told her there is a number of medical problems can cause itching.  The patient was therefore concerned.  I offered to do blood work and have her follow-up with her primary care doctor.  Discussed treatment of itching with antihistamines Final Clinical Impressions(s) / UC Diagnoses   Final diagnoses:  Pruritus     Discharge Instructions      Make sure you use a good lotion I am doing blood work.  You can test results via MyChart Follow-up with Dr. Dianah Field For the itching, take antihistamines.  Take two Claritin or Zyrtec in the morning, and one or two 25 mg Benadryl at night   ED Prescriptions   None    PDMP not reviewed this encounter.   Raylene Everts,  MD 02/15/21 1744

## 2021-02-16 NOTE — Assessment & Plan Note (Signed)
Update 02/16/2021: Persistent leukocytosis present for the past several years, sounds like decades ago it was as high as 32,000 and she did have an evaluation from hematology that was unrevealing, I do not have this for my review, up to 20,000 after an urgent care visit, I would like to add a pathologist smear review and I would like her to touch base with hematology as well.

## 2021-02-16 NOTE — Addendum Note (Signed)
Addended by: Silverio Decamp on: 02/16/2021 11:48 AM   Modules accepted: Orders

## 2021-02-17 LAB — CBC WITH DIFFERENTIAL/PLATELET
Absolute Monocytes: 877 cells/uL (ref 200–950)
Basophils Absolute: 41 cells/uL (ref 0–200)
Basophils Relative: 0.2 %
Eosinophils Absolute: 20 cells/uL (ref 15–500)
Eosinophils Relative: 0.1 %
HCT: 43.4 % (ref 35.0–45.0)
Hemoglobin: 14.9 g/dL (ref 11.7–15.5)
Lymphs Abs: 4366 cells/uL — ABNORMAL HIGH (ref 850–3900)
MCH: 30.3 pg (ref 27.0–33.0)
MCHC: 34.3 g/dL (ref 32.0–36.0)
MCV: 88.4 fL (ref 80.0–100.0)
MPV: 9.8 fL (ref 7.5–12.5)
Monocytes Relative: 4.3 %
Neutro Abs: 15096 cells/uL — ABNORMAL HIGH (ref 1500–7800)
Neutrophils Relative %: 74 %
Platelets: 328 10*3/uL (ref 140–400)
RBC: 4.91 10*6/uL (ref 3.80–5.10)
RDW: 12.8 % (ref 11.0–15.0)
Total Lymphocyte: 21.4 %
WBC: 20.4 10*3/uL — ABNORMAL HIGH (ref 3.8–10.8)

## 2021-02-17 LAB — COMPLETE METABOLIC PANEL WITH GFR
AG Ratio: 1.4 (calc) (ref 1.0–2.5)
ALT: 11 U/L (ref 6–29)
AST: 12 U/L (ref 10–35)
Albumin: 4.1 g/dL (ref 3.6–5.1)
Alkaline phosphatase (APISO): 63 U/L (ref 37–153)
BUN: 19 mg/dL (ref 7–25)
CO2: 19 mmol/L — ABNORMAL LOW (ref 20–32)
Calcium: 9.1 mg/dL (ref 8.6–10.4)
Chloride: 104 mmol/L (ref 98–110)
Creat: 0.99 mg/dL (ref 0.50–1.03)
Globulin: 2.9 g/dL (calc) (ref 1.9–3.7)
Glucose, Bld: 82 mg/dL (ref 65–99)
Potassium: 3.9 mmol/L (ref 3.5–5.3)
Sodium: 138 mmol/L (ref 135–146)
Total Bilirubin: 0.4 mg/dL (ref 0.2–1.2)
Total Protein: 7 g/dL (ref 6.1–8.1)
eGFR: 69 mL/min/{1.73_m2} (ref >=60)

## 2021-02-17 LAB — TEST AUTHORIZATION

## 2021-02-17 LAB — PATHOLOGIST SMEAR REVIEW

## 2021-02-17 LAB — VITAMIN B12: Vitamin B-12: 548 pg/mL (ref 200–1100)

## 2021-02-17 LAB — TSH: TSH: 1.18 mIU/L

## 2021-02-17 LAB — VITAMIN D 25 HYDROXY (VIT D DEFICIENCY, FRACTURES): Vit D, 25-Hydroxy: 19 ng/mL — ABNORMAL LOW (ref 30–100)

## 2021-02-22 ENCOUNTER — Other Ambulatory Visit: Payer: Self-pay | Admitting: Family

## 2021-02-22 DIAGNOSIS — D72825 Bandemia: Secondary | ICD-10-CM

## 2021-02-23 ENCOUNTER — Other Ambulatory Visit: Payer: Self-pay

## 2021-02-23 ENCOUNTER — Emergency Department (HOSPITAL_BASED_OUTPATIENT_CLINIC_OR_DEPARTMENT_OTHER): Payer: 59

## 2021-02-23 ENCOUNTER — Encounter (HOSPITAL_BASED_OUTPATIENT_CLINIC_OR_DEPARTMENT_OTHER): Payer: Self-pay

## 2021-02-23 ENCOUNTER — Inpatient Hospital Stay: Payer: 59 | Attending: Sports Medicine

## 2021-02-23 ENCOUNTER — Inpatient Hospital Stay (HOSPITAL_BASED_OUTPATIENT_CLINIC_OR_DEPARTMENT_OTHER): Payer: 59 | Admitting: Family

## 2021-02-23 ENCOUNTER — Emergency Department (HOSPITAL_BASED_OUTPATIENT_CLINIC_OR_DEPARTMENT_OTHER)
Admission: EM | Admit: 2021-02-23 | Discharge: 2021-02-23 | Disposition: A | Discharge status: Home / Self Care | Payer: 59 | Attending: Emergency Medicine | Admitting: Emergency Medicine

## 2021-02-23 ENCOUNTER — Encounter: Payer: Self-pay | Admitting: Family

## 2021-02-23 VITALS — BP 135/77 | HR 75 | Temp 98.7°F | Resp 18 | Wt 213.0 lb

## 2021-02-23 DIAGNOSIS — D72829 Elevated white blood cell count, unspecified: Secondary | ICD-10-CM | POA: Insufficient documentation

## 2021-02-23 DIAGNOSIS — Z79899 Other long term (current) drug therapy: Secondary | ICD-10-CM | POA: Insufficient documentation

## 2021-02-23 DIAGNOSIS — I10 Essential (primary) hypertension: Secondary | ICD-10-CM | POA: Diagnosis not present

## 2021-02-23 DIAGNOSIS — D72825 Bandemia: Secondary | ICD-10-CM

## 2021-02-23 DIAGNOSIS — R079 Chest pain, unspecified: Secondary | ICD-10-CM | POA: Diagnosis present

## 2021-02-23 DIAGNOSIS — F17210 Nicotine dependence, cigarettes, uncomplicated: Secondary | ICD-10-CM | POA: Insufficient documentation

## 2021-02-23 DIAGNOSIS — R0602 Shortness of breath: Secondary | ICD-10-CM | POA: Diagnosis not present

## 2021-02-23 LAB — CMP (CANCER CENTER ONLY)
ALT: 11 U/L (ref 0–44)
AST: 9 U/L — ABNORMAL LOW (ref 15–41)
Albumin: 3.8 g/dL (ref 3.5–5.0)
Alkaline Phosphatase: 70 U/L (ref 38–126)
Anion gap: 6 (ref 5–15)
BUN: 16 mg/dL (ref 6–20)
CO2: 28 mmol/L (ref 22–32)
Calcium: 9.4 mg/dL (ref 8.9–10.3)
Chloride: 103 mmol/L (ref 98–111)
Creatinine: 1.09 mg/dL — ABNORMAL HIGH (ref 0.44–1.00)
GFR, Estimated: >60 mL/min (ref >60)
Glucose, Bld: 102 mg/dL — ABNORMAL HIGH (ref 70–99)
Potassium: 3.9 mmol/L (ref 3.5–5.1)
Sodium: 137 mmol/L (ref 135–145)
Total Bilirubin: 0.5 mg/dL (ref 0.3–1.2)
Total Protein: 6.4 g/dL — ABNORMAL LOW (ref 6.5–8.1)

## 2021-02-23 LAB — CBC WITH DIFFERENTIAL (CANCER CENTER ONLY)
Abs Immature Granulocytes: 0.07 10*3/uL (ref 0.00–0.07)
Basophils Absolute: 0.1 10*3/uL (ref 0.0–0.1)
Basophils Relative: 1 %
Eosinophils Absolute: 0.1 10*3/uL (ref 0.0–0.5)
Eosinophils Relative: 1 %
HCT: 42.4 % (ref 36.0–46.0)
Hemoglobin: 13.7 g/dL (ref 12.0–15.0)
Immature Granulocytes: 1 %
Lymphocytes Relative: 26 %
Lymphs Abs: 3.7 10*3/uL (ref 0.7–4.0)
MCH: 30.1 pg (ref 26.0–34.0)
MCHC: 32.3 g/dL (ref 30.0–36.0)
MCV: 93.2 fL (ref 80.0–100.0)
Monocytes Absolute: 1.1 10*3/uL — ABNORMAL HIGH (ref 0.1–1.0)
Monocytes Relative: 8 %
Neutro Abs: 9.2 10*3/uL — ABNORMAL HIGH (ref 1.7–7.7)
Neutrophils Relative %: 63 %
Platelet Count: 276 10*3/uL (ref 150–400)
RBC: 4.55 MIL/uL (ref 3.87–5.11)
RDW: 12.9 % (ref 11.5–15.5)
WBC Count: 14.2 10*3/uL — ABNORMAL HIGH (ref 4.0–10.5)
nRBC: 0 % (ref 0.0–0.2)

## 2021-02-23 LAB — TROPONIN I (HIGH SENSITIVITY)
Troponin I (High Sensitivity): 2 ng/L (ref <18)
Troponin I (High Sensitivity): 2 ng/L (ref <18)

## 2021-02-23 LAB — SAVE SMEAR(SSMR), FOR PROVIDER SLIDE REVIEW

## 2021-02-23 LAB — LACTATE DEHYDROGENASE: LDH: 137 U/L (ref 98–192)

## 2021-02-23 MED ORDER — ALUM & MAG HYDROXIDE-SIMETH 200-200-20 MG/5ML PO SUSP
30.0000 mL | Freq: Once | ORAL | Status: AC
  Administered 2021-02-23: 30 mL via ORAL
  Filled 2021-02-23: qty 30

## 2021-02-23 MED ORDER — IOHEXOL 350 MG/ML SOLN
100.0000 mL | Freq: Once | INTRAVENOUS | Status: AC | PRN
  Administered 2021-02-23: 100 mL via INTRAVENOUS

## 2021-02-23 MED ORDER — ASPIRIN 325 MG PO TABS
325.0000 mg | ORAL_TABLET | Freq: Every day | ORAL | Status: DC
  Administered 2021-02-23: 325 mg via ORAL
  Filled 2021-02-23: qty 1

## 2021-02-23 NOTE — ED Notes (Signed)
Unable to obtain Trop on PT and told to return to lobby per Triage RN

## 2021-02-23 NOTE — ED Provider Notes (Signed)
[facility] EMERGENCY DEPARTMENT Provider Note   CSN: [id number] Arrival date & time: 02/23/21  1218     History Chief Complaint  Patient presents with   Chest Pain    Sarah Nicholson is a 50 y.o. female.  HPI  Patient with history of hypertension, bronchitis, leukocytosis presents due to chest pain and shortness of breath.  Chest pain started 5 days ago, its central without radiation.  Sharp pain, worse after eating or exertion.  There is no associated nausea or vomiting although she does feel short of breath intermittently.  Nothing has alleviated the pain, the pain itself is constant the severity changes.  Feels that she is having palpitations, her heart rate has been elevated at home.  No history of MI or blood clot.  No diabetes or hyperlipidemia.  Patient does smoke cigarettes, 1 or 2 a day although she is on Chantix.  Does not need oxygen at home.  She is followed by heme-onc due to leukocytosis, no personal history of identified cancer or history of radiation or chemotherapy.  Past Medical History:  Diagnosis Date   ADD (attention deficit disorder) 06/14/2012   Anxiety    Bronchitis    Complication of anesthesia 1987   states woke up during appedentomy   Depression 06/14/2012   Hepatic steatosis 11/13/2018   Hepatic steatosis with hepatomegaly seen on MRI abd on 11/2018   History of leukocytosis 06/14/2012   Reports "WBC 32" decades ago with negative hematology specialist workup.    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   Hepatic steatosis 11/13/2018   Tobacco use 07/18/2018   Sun-induced skin changes, keratosis 08/08/2017    Subcutaneous cyst 08/08/2017   Irritable bowel syndrome (IBS) 05/04/2017   Generalized anxiety disorder 03/16/2016   Social anxiety disorder 03/16/2016   Major depressive disorder, recurrent episode, moderate ([place]) 03/16/2016   Right inguinal hernia 07/14/2015   Hallux valgus 12/09/2014   Acute headache 07/31/2014   Chest pain 07/31/2014   Hypotension 07/31/2014   Other and unspecified ovarian cyst 06/25/2013   Left tennis elbow 06/25/2013   Left breast mass 06/27/2012   Microscopic hematuria 06/27/2012   Hyperlipidemia 06/14/2012   Prediabetes 06/14/2012   Seasonal allergies 06/14/2012   Depression 06/14/2012   ADD (attention deficit disorder) 06/14/2012    Past Surgical History:  Procedure Laterality Date   APPENDECTOMY     BICEPT TENODESIS Right 10/10/2019   Procedure: BICEPS TENODESIS;  Surgeon: Hiram Gash, MD;  Location: [facility];  Service: Orthopedics;  Laterality: Right;   bladder sling/mesh     INGUINAL HERNIA REPAIR     intrauterine ablation     NASAL SINUS SURGERY     TUBAL LIGATION       OB History     Gravida  5   Para  4   Term  2   Preterm  2   AB  1   Living  3      SAB      IAB      Ectopic      Multiple      Live Births  Family History  Problem Relation Age of Onset   Depression Mother    Diabetes Father    Hyperlipidemia Father    Hypertension Father    Depression Sister    Breast cancer Maternal Grandmother    Depression Son     Social History   Tobacco Use   Smoking status: Every Day    Packs/day: 0.50    Types: Cigarettes   Smokeless tobacco: Never   Tobacco comments:    30 years , currently on Chantix  Vaping Use   Vaping Use: Never used  Substance Use Topics   Alcohol use: Yes    Comment: 1-2 drinks every 6 months   Drug use: No    Home Medications Prior to Admission medications   Medication Sig Start Date End Date Taking? Authorizing Provider  benzonatate (TESSALON)  200 MG capsule Take 1 capsule (200 mg total) by mouth 2 (two) times daily as needed for cough. Patient not taking: Reported on 02/23/2021 02/11/21   Caleen Jobs B, NP  cetirizine (ZYRTEC) 10 MG tablet Take 20 mg by mouth daily.    [provider]  clonazePAM (KLONOPIN) 0.5 MG tablet TAKE 1 TABLET(0.5 MG) BY MOUTH THREE TIMES DAILY AS NEEDED FOR ANXIETY 12/14/20   Breeback, Jade L, PA-C  cloNIDine (CATAPRES) 0.1 MG tablet Take 1 tablet (0.1 mg total) by mouth 3 (three) times daily as needed (systolic blood pressure >956). 03/11/20   Emeterio Reeve, DO  cyclobenzaprine (FLEXERIL) 10 MG tablet Take 10 mg by mouth 3 (three) times daily as needed for muscle spasms. Patient not taking: Reported on 02/23/2021    [provider]  enalapril (VASOTEC) 10 MG tablet TAKE 1/2 TABLET(5 MG) BY MOUTH TWICE DAILY FOR 1 DAY THEN TAKE 1 TABLET(10 MG) BY MOUTH TWICE DAILY 12/31/20   Silverio Decamp, MD  EPINEPHrine 0.3 mg/0.3 mL IJ SOAJ injection INJECT INTRAMUSCULARLY AS DIRECTED 09/21/20   Emeterio Reeve, DO  escitalopram (LEXAPRO) 20 MG tablet TAKE 2 TABLETS(40 MG) BY MOUTH DAILY 12/14/20   Breeback, Jade L, PA-C  famotidine (PEPCID) 10 MG tablet Take 10 mg by mouth at bedtime. Unsure dose    [provider]  guaiFENesin (MUCINEX) 600 MG 12 hr tablet Take 2 tablets (1,200 mg total) by mouth 2 (two) times daily. Patient not taking: Reported on 02/23/2021 02/11/21   Caleen Jobs B, NP  hydrochlorothiazide (HYDRODIURIL) 12.5 MG tablet TAKE 1 TABLET(12.5 MG) BY MOUTH DAILY 12/31/20   Silverio Decamp, MD  varenicline (CHANTIX) 1 MG tablet Take 1 tablet (1 mg total) by mouth 2 (two) times daily. 01/27/21   Silverio Decamp, MD  Varenicline Tartrate, Starter, (CHANTIX STARTING MONTH PAK) 0.5 MG X 11 & 1 MG X 42 TBPK Take 1 each by mouth daily. Take one 0.5 mg tablet by mouth once daily for 3 days, then increase to one 0.5 mg tablet twice daily for 4 days, then increase to  one 1 mg tablet twice daily. 01/27/21 02/26/21  Silverio Decamp, MD    Allergies    Macrobid [nitrofurantoin macrocrystal], Macrobid [nitrofurantoin], Other, Tetanus toxoids, and Thimerosal  Review of Systems   Review of Systems  Constitutional:  Negative for chills and fever.  HENT:  Negative for ear pain and sore throat.   Eyes:  Negative for visual disturbance.  Respiratory:  Positive for shortness of breath. Negative for cough.   Cardiovascular:  Positive for chest pain and palpitations. Negative for leg swelling.  Gastrointestinal:  Negative for abdominal  pain and vomiting.  Genitourinary:  Negative for dysuria and hematuria.  Musculoskeletal:  Negative for gait problem.  Skin:  Negative for color change and rash.  Neurological:  Negative for seizures and syncope.  All other systems reviewed and are negative.  Physical Exam Updated Vital Signs BP 125/65    Pulse 64    Temp 98.6 F (37 C) (Oral)    Resp 19    Ht 5\' 6"  (1.676 m)    Wt 96.6 kg    SpO2 95%    BMI 34.38 kg/m   Physical Exam Vitals and nursing note reviewed. Exam conducted with a chaperone present.  Constitutional:      Appearance: Normal appearance.  HENT:     Head: Normocephalic and atraumatic.  Eyes:     General: No scleral icterus.       Right eye: No discharge.        Left eye: No discharge.     Extraocular Movements: Extraocular movements intact.     Pupils: Pupils are equal, round, and reactive to light.  Cardiovascular:     Rate and Rhythm: Normal rate and regular rhythm.     Pulses: Normal pulses.     Heart sounds: Normal heart sounds. No murmur heard.   No friction rub. No gallop.  Pulmonary:     Effort: Pulmonary effort is normal. No respiratory distress.     Comments: Lungs clear to auscultation bilaterally, no tachypnea Abdominal:     General: Abdomen is flat. Bowel sounds are normal. There is no distension.     Palpations: Abdomen is soft.     Tenderness: There is no abdominal  tenderness.  Skin:    General: Skin is warm and dry.     Coloration: Skin is not jaundiced.  Neurological:     Mental Status: She is alert. Mental status is at baseline.     Coordination: Coordination normal.    ED Results / Procedures / Treatments   Labs (all labs ordered are listed, but only abnormal results are displayed) Labs Reviewed  TROPONIN I (HIGH SENSITIVITY)  TROPONIN I (HIGH SENSITIVITY)    EKG EKG Interpretation  Date/Time:  Tuesday February 23 2021 12:25:09 EST Ventricular Rate:  73 PR Interval:  150 QRS Duration: 100 QT Interval:  348 QTC Calculation: 383 R Axis:   68 Text Interpretation: Normal sinus rhythm Right atrial enlargement Nonspecific T wave abnormality Abnormal ECG Confirmed by Madalyn Rob [phone]) on 02/23/2021 12:38:29 PM  Radiology DG Chest 2 View  Result Date: 02/23/2021 CLINICAL DATA:  Chest pain EXAM: CHEST - 2 VIEW COMPARISON:  Radiograph 03/11/2020 FINDINGS: Unchanged cardiomediastinal silhouette. No focal airspace consolidation. No large pleural effusion. No visible pneumothorax. There is no acute osseous abnormality. IMPRESSION: No evidence of acute cardiopulmonary disease. Electronically Signed   By: Maurine Simmering M.D.   On: 02/23/2021 12:58    Procedures Procedures   Medications Ordered in ED Medications  alum & mag hydroxide-simeth (MAALOX/MYLANTA) 200-200-20 MG/5ML suspension 30 mL (has no administration in time range)  aspirin tablet 325 mg (has no administration in time range)    ED Course  I have reviewed the triage vital signs and the nursing notes.  Pertinent labs & imaging results that were available during my care of the patient were reviewed by me and considered in my medical decision making (see chart for details).    MDM Rules/Calculators/A&P  Stable vitals, patient nontoxic-appearing.  No tachycardia or tachypnea, no hypoxia.  Chart review performed.  Patient had lab work done earlier  today, CBC shows a leukocytosis of 14 which is improved from previous.  Appears to be reactionary.  No gross electrolyte derangement on the CMP.  EKG shows right atrial enlargement and nonspecific T wave changes, no ST elevation or depression.  Delta troponin negative, lower suspicion for ACS.  CTA ordered given risk factors for PE, CTA was negative.  Radiograph did not show any signs of pneumonia or cardiomegaly.   Vitals remained stable, pain improved with GI cocktail and aspirin.    At this time I do not think patient requires additional work-up for the chest pain and shortness of breath.  She remains with stable vital signs and no tachypnea or hypoxia.  Advised to follow-up with PCP in a week and to return if things change or worsen.  Final Clinical Impression(s) / ED Diagnoses Final diagnoses:  None    Rx / DC Orders ED Discharge Orders     None        Sherrill Raring, Hershal Coria 02/23/21 2311    Dorie Rank, MD 02/24/21 1540

## 2021-02-23 NOTE — Discharge Instructions (Signed)
Follow-up with your primary care doctor later this week or early next week if the symptoms continue.  Return to ED if the pain worsens or changes.  Continue your current medication and working towards quitting smoking.

## 2021-02-23 NOTE — Progress Notes (Signed)
Hematology/Oncology Consultation   Name: Sarah Nicholson      MRN: [id number]    Location: Room/bed info not found  Date: 02/23/2021 Time:1:05 PM   REFERRING PHYSICIAN: Aundria Mems, MD  REASON FOR CONSULT: Bandemia     DIAGNOSIS: Intermittent elevated WBC count.   HISTORY OF PRESENT ILLNESS:  [name] is a pleasant 50 yo caucasian female with an intermittent elevated WBC count.  WBC count today is 14.2, ANC 9.2. Hgb 13.7, MCV 93 and platelets 276.  She states that she has had Covid 4 times in the last 2 years (most recently . She has also had the flu.  She is on her third week of Chantix and is down to 1-2 cigarettes a day.  She is symptomatic a this time with chest discomfort, SOB even while sitting and dizziness. No changes in or loss of vision at this time. She states that she has had these symptoms since last Thursday. She feels that they may be cardiac related and not pulmonary.  She has taken a Klonopin and states it did not help her symptoms.  She occasionally feels like she will pass out but so far has not. No falls reported.  No fever, chills, n/v, cough, rash, palpitations or changes in bowel or bladder habits.  No swelling, tenderness, numbness or tingling in her extremities at this time.  She does have intermittent lag cramps but had to stop taking mag ox due to diarrhea.  She states that she sometimes has abdominal bloating and swelling in her ankles. Pedal pulses are 2+.  She has left upper quadrant pain. No distention noted on exam.  No adenopathy noted.  She has history of idiopathic hematuria. No other blood loss.  No abnormal bruising, no petechiae.  She has 3 children and no history of miscarriage.  No personal history of cancer. Her maternal grandmother had breast cancer.  She notes that she is over due for her annual mammogram and colonoscopy. She states that her last colonoscopy was about 13 years ago and was negative.  No history of diabetes or  thyroid disease.  Rare ETOH. No recreational drug use.  She states that she does not have an appetite but still eats and is staying well hydrated. Her weight when compared monthly over the last year gas remained stable.  She stays busy with her family as well as working as a Freight forwarder for [employer].   ROS: All other 10 point review of systems is negative.   PAST MEDICAL HISTORY:   Past Medical History:  Diagnosis Date   ADD (attention deficit disorder) 06/14/2012   Anxiety    Bronchitis    Complication of anesthesia 1987   states woke up during appedentomy   Depression 06/14/2012   Hepatic steatosis 11/13/2018   Hepatic steatosis with hepatomegaly seen on MRI abd on 11/2018   History of leukocytosis 06/14/2012   Reports "WBC 32" decades ago with negative hematology specialist workup.    Hyperlipidemia 06/14/2012   Hypertension    Seasonal allergies 06/14/2012    ALLERGIES: Allergies  Allergen Reactions   Macrobid [Nitrofurantoin Macrocrystal] Hives   Macrobid [Nitrofurantoin] Hives and Rash   Other Hives    Preservative used in vaccinations   Tetanus Toxoids Swelling    Pt states she was told the Tdap injection would have to be administered in the hospital  After discussion on 11/10 pt remembers being told it was thimersol in the tetanus 30+years ago that caused the swelling.  Thimerosal Rash      MEDICATIONS:  Current Outpatient Medications on File Prior to Visit  Medication Sig Dispense Refill   cetirizine (ZYRTEC) 10 MG tablet Take 20 mg by mouth daily.     clonazePAM (KLONOPIN) 0.5 MG tablet TAKE 1 TABLET(0.5 MG) BY MOUTH THREE TIMES DAILY AS NEEDED FOR ANXIETY 30 tablet 0   cloNIDine (CATAPRES) 0.1 MG tablet Take 1 tablet (0.1 mg total) by mouth 3 (three) times daily as needed (systolic blood pressure >983). 90 tablet 0   enalapril (VASOTEC) 10 MG tablet TAKE 1/2 TABLET(5 MG) BY MOUTH TWICE DAILY FOR 1 DAY THEN TAKE 1 TABLET(10 MG) BY MOUTH TWICE DAILY 60 tablet 1    EPINEPHrine 0.3 mg/0.3 mL IJ SOAJ injection INJECT INTRAMUSCULARLY AS DIRECTED 2 each 2   escitalopram (LEXAPRO) 20 MG tablet TAKE 2 TABLETS(40 MG) BY MOUTH DAILY 180 tablet 1   famotidine (PEPCID) 10 MG tablet Take 10 mg by mouth at bedtime. Unsure dose     hydrochlorothiazide (HYDRODIURIL) 12.5 MG tablet TAKE 1 TABLET(12.5 MG) BY MOUTH DAILY 90 tablet 1   varenicline (CHANTIX) 1 MG tablet Take 1 tablet (1 mg total) by mouth 2 (two) times daily. 60 tablet 3   Varenicline Tartrate, Starter, (CHANTIX STARTING MONTH PAK) 0.5 MG X 11 & 1 MG X 42 TBPK Take 1 each by mouth daily. Take one 0.5 mg tablet by mouth once daily for 3 days, then increase to one 0.5 mg tablet twice daily for 4 days, then increase to one 1 mg tablet twice daily. 1 each 0   benzonatate (TESSALON) 200 MG capsule Take 1 capsule (200 mg total) by mouth 2 (two) times daily as needed for cough. (Patient not taking: Reported on 02/23/2021) 20 capsule 1   cyclobenzaprine (FLEXERIL) 10 MG tablet Take 10 mg by mouth 3 (three) times daily as needed for muscle spasms. (Patient not taking: Reported on 02/23/2021)     guaiFENesin (MUCINEX) 600 MG 12 hr tablet Take 2 tablets (1,200 mg total) by mouth 2 (two) times daily. (Patient not taking: Reported on 02/23/2021) 30 tablet 2   No current facility-administered medications on file prior to visit.     PAST SURGICAL HISTORY Past Surgical History:  Procedure Laterality Date   APPENDECTOMY     BICEPT TENODESIS Right 10/10/2019   Procedure: BICEPS TENODESIS;  Surgeon: Hiram Gash, MD;  Location: [facility];  Service: Orthopedics;  Laterality: Right;   bladder sling/mesh     INGUINAL HERNIA REPAIR     intrauterine ablation     NASAL SINUS SURGERY     TUBAL LIGATION      FAMILY HISTORY: Family History  Problem Relation Age of Onset   Depression Mother    Diabetes Father    Hyperlipidemia Father    Hypertension Father    Depression Sister    Breast cancer Maternal  Grandmother    Depression Son     SOCIAL HISTORY:  reports that she has been smoking cigarettes. She has been smoking an average of .5 packs per day. She has never used smokeless tobacco. She reports current alcohol use. She reports that she does not use drugs.  PERFORMANCE STATUS: The patient's performance status is 1 - Symptomatic but completely ambulatory  PHYSICAL EXAM: Most Recent Vital Signs: Blood pressure 135/77, pulse 75, temperature 98.7 F (37.1 C), temperature source Oral, resp. rate 18, weight 213 lb (96.6 kg), SpO2 98 %. BP 135/77 (BP Location: Left Arm, Patient Position: Sitting)  Pulse 75    Temp 98.7 F (37.1 C) (Oral)    Resp 18    Wt 213 lb (96.6 kg)    SpO2 98%    BMI 34.38 kg/m   General Appearance:    Alert, cooperative, no distress, appears stated age  Head:    Normocephalic, without obvious abnormality, atraumatic  Eyes:    PERRL, conjunctiva/corneas clear, EOM's intact, fundi    benign, both eyes        Throat:   Lips, mucosa, and tongue normal; teeth and gums normal  Neck:   Supple, symmetrical, trachea midline, no adenopathy;    thyroid:  no enlargement/tenderness/nodules; no carotid   bruit or JVD  Back:     Symmetric, no curvature, ROM normal, no CVA tenderness  Lungs:     Clear to auscultation bilaterally, respirations unlabored  Chest Wall:    No tenderness or deformity   Heart:    Regular rate and rhythm, S1 and S2 normal, no murmur, rub   or gallop     Abdomen:     Soft, non-tender, bowel sounds active all four quadrants,    no masses, no organomegaly        Extremities:   Extremities normal, atraumatic, no cyanosis or edema  Pulses:   2+ and symmetric all extremities  Skin:   Skin color, texture, turgor normal, no rashes or lesions  Lymph nodes:   Cervical, supraclavicular, and axillary nodes normal  Neurologic:   CNII-XII intact, normal strength, sensation and reflexes    throughout    LABORATORY DATA:  Results for orders placed or  performed in visit on 02/23/21 (from the past 48 hour(s))  CBC with Differential (Cancer Center Only)     Status: Abnormal   Collection Time: 02/23/21 10:46 AM  Result Value Ref Range   WBC Count 14.2 (H) 4.0 - 10.5 K/uL   RBC 4.55 3.87 - 5.11 MIL/uL   Hemoglobin 13.7 12.0 - 15.0 g/dL   HCT 42.4 36.0 - 46.0 %   MCV 93.2 80.0 - 100.0 fL   MCH 30.1 26.0 - 34.0 pg   MCHC 32.3 30.0 - 36.0 g/dL   RDW 12.9 11.5 - 15.5 %   Platelet Count 276 150 - 400 K/uL   nRBC 0.0 0.0 - 0.2 %   Neutrophils Relative % 63 %   Neutro Abs 9.2 (H) 1.7 - 7.7 K/uL   Lymphocytes Relative 26 %   Lymphs Abs 3.7 0.7 - 4.0 K/uL   Monocytes Relative 8 %   Monocytes Absolute 1.1 (H) 0.1 - 1.0 K/uL   Eosinophils Relative 1 %   Eosinophils Absolute 0.1 0.0 - 0.5 K/uL   Basophils Relative 1 %   Basophils Absolute 0.1 0.0 - 0.1 K/uL   Immature Granulocytes 1 %   Abs Immature Granulocytes 0.07 0.00 - 0.07 K/uL    Comment: Performed at [facility] at [facility], [address], [place], [place] [address]  CMP ([facility] only)     Status: Abnormal   Collection Time: 02/23/21 10:46 AM  Result Value Ref Range   Sodium 137 135 - 145 mmol/L   Potassium 3.9 3.5 - 5.1 mmol/L   Chloride 103 98 - 111 mmol/L   CO2 28 22 - 32 mmol/L   Glucose, Bld 102 (H) 70 - 99 mg/dL    Comment: Glucose reference range applies only to samples taken after fasting for at least 8 hours.   BUN 16  6 - 20 mg/dL   Creatinine 1.09 (H) 0.44 - 1.00 mg/dL   Calcium 9.4 8.9 - 10.3 mg/dL   Total Protein 6.4 (L) 6.5 - 8.1 g/dL   Albumin 3.8 3.5 - 5.0 g/dL   AST 9 (L) 15 - 41 U/L   ALT 11 0 - 44 U/L   Alkaline Phosphatase 70 38 - 126 U/L   Total Bilirubin 0.5 0.3 - 1.2 mg/dL   GFR, Estimated >60 >60 mL/min    Comment: (NOTE) Calculated using the CKD-EPI Creatinine Equation (2021)    Anion gap 6 5 - 15    Comment: Performed at [facility] at [facility], [address] [place],  [place] [address]  Lactate dehydrogenase (LDH)     Status: None   Collection Time: 02/23/21 10:46 AM  Result Value Ref Range   LDH 137 98 - 192 U/L    Comment: Performed at [facility] at [facility], [address], [place], [place] [address]  [facility])     Status: None   Collection Time: 02/23/21 10:46 AM  Result Value Ref Range   Smear Review SMEAR STAINED AND AVAILABLE FOR REVIEW     Comment: Performed at [facility] Lab at [facility], [address] [place], [place] [address]      RADIOGRAPHY: DG Chest 2 View  Result Date: 02/23/2021 CLINICAL DATA:  Chest pain EXAM: CHEST - 2 VIEW COMPARISON:  Radiograph 03/11/2020 FINDINGS: Unchanged cardiomediastinal silhouette. No focal airspace consolidation. No large pleural effusion. No visible pneumothorax. There is no acute osseous abnormality. IMPRESSION: No evidence of acute cardiopulmonary disease. Electronically Signed   By: Maurine Simmering M.D.   On: 02/23/2021 12:58       PATHOLOGY: None  ASSESSMENT/PLAN: [name] is a pleasant 50 yo caucasian female with an intermittent elevated WBC count felt to be reactive.  With her above complaint of CP, SOB and dizziness we recommended she go to the ED. She was in agreement and was taken down by wheelchair.  Blood work and smear reviewed with Dr. Marin Olp. Cells appear to be well developed. No abnormality or evidence of malignancy noted.  Follow-in 6 months. If her counts remain stable at that time we will release her back to her PCP.   All questions were answered. The patient knows to call the clinic with any problems, questions or concerns. We can certainly see the patient much sooner if necessary.  The patient was discussed with aDr. Marin Olp and he is in agreement with the aforementioned.   Lottie Dawson, NP

## 2021-02-23 NOTE — ED Triage Notes (Addendum)
Pt arrives from hematology office, seen for high WBC. Pt reports she started having CP Thursday night, pt reports she was sitting when it started. Reports a resting HR of 93 yesterday, took her anxiety medication but states it did not help symptoms. CP is central with no radiation. Pt is recovering from covid, was sick around thanksgiving.

## 2021-02-23 NOTE — ED Notes (Signed)
Patient transported to CT 

## 2021-02-24 ENCOUNTER — Telehealth: Payer: Self-pay | Admitting: General Practice

## 2021-02-24 ENCOUNTER — Ambulatory Visit (INDEPENDENT_AMBULATORY_CARE_PROVIDER_SITE_OTHER): Payer: 59 | Admitting: Sports Medicine

## 2021-02-24 DIAGNOSIS — F411 Generalized anxiety disorder: Secondary | ICD-10-CM

## 2021-02-24 DIAGNOSIS — F32A Depression, unspecified: Secondary | ICD-10-CM

## 2021-02-24 DIAGNOSIS — M47816 Spondylosis without myelopathy or radiculopathy, lumbar region: Secondary | ICD-10-CM | POA: Diagnosis not present

## 2021-02-24 DIAGNOSIS — R109 Unspecified abdominal pain: Secondary | ICD-10-CM | POA: Diagnosis not present

## 2021-02-24 MED ORDER — ESCITALOPRAM OXALATE 5 MG PO TABS: ORAL_TABLET | ORAL | 0 refills | Status: DC

## 2021-02-24 MED ORDER — ESOMEPRAZOLE MAGNESIUM 40 MG PO CPDR: 40.0000 mg | DELAYED_RELEASE_CAPSULE | Freq: Two times a day (BID) | ORAL | 3 refills | Status: DC

## 2021-02-24 MED ORDER — DULOXETINE HCL 30 MG PO CPEP
30.0000 mg | ORAL_CAPSULE | Freq: Every day | ORAL | 3 refills | Status: DC
Start: 2021-02-24 — End: 2021-04-16

## 2021-02-24 NOTE — Assessment & Plan Note (Signed)
We have also tried to get her a lumbar spine MRI, she was having low back pain present for greater than 6 weeks in spite of conservative treatment for greater than 6 weeks, left-sided, quadratus lumborum with cramping in both legs, prednisone did not work, Mag-Ox created diarrhea. At this point I think she needs an epidural, still awaiting MRI. I do have suspicion for L4 radiculitis.

## 2021-02-24 NOTE — Progress Notes (Signed)
° ° °  Procedures performed today:    None.  Independent interpretation of notes and tests performed by another provider:   None.  Brief History, Exam, Impression, and Recommendations:    Depression This is a pleasant 50 year old female, she is consolation of symptoms, see below. Dominant symptoms are allodynia, depressed mood with tearfulness, poor energy. Aches and pains. I do think we are dealing with fibromyalgia here, she is on Lexapro 20, not sufficiently efficacious, I do think she needs an SNRI so we will do a Lexapro down taper over 2 weeks and we will also go ahead and start Cymbalta now. We will do a 6-week titration  Left lateral abdominal pain Since he also continues to have left upper quadrant, left flank pain. Seems to be worse when eating. Does have a history of chronic hematuria and nephrolithiasis, CT, labs all unrevealing. Though this may be IBS or splenic flexure syndrome, considering the reproduction of pain with eating I would be concerned with peptic ulcer disease. Discontinue Pepcid, switching to Nexium 40 twice daily, we have already done a referral to gastroenterology, looks like they tried to call her a couple of times, I will give her the contact information..  Lumbar spondylosis We have also tried to get her a lumbar spine MRI, she was having low back pain present for greater than 6 weeks in spite of conservative treatment for greater than 6 weeks, left-sided, quadratus lumborum with cramping in both legs, prednisone did not work, Mag-Ox created diarrhea. At this point I think she needs an epidural, still awaiting MRI. I do have suspicion for L4 radiculitis.    ___________________________________________ [name], M.D., ABFM., CAQSM. Primary Care and [place] Instructor of [place] of [facility] of Medicine

## 2021-02-24 NOTE — Assessment & Plan Note (Signed)
Since he also continues to have left upper quadrant, left flank pain. Seems to be worse when eating. Does have a history of chronic hematuria and nephrolithiasis, CT, labs all unrevealing. Though this may be IBS or splenic flexure syndrome, considering the reproduction of pain with eating I would be concerned with peptic ulcer disease. Discontinue Pepcid, switching to Nexium 40 twice daily, we have already done a referral to gastroenterology, looks like they tried to call her a couple of times, I will give her the contact information.[name]

## 2021-02-24 NOTE — Assessment & Plan Note (Signed)
This is a pleasant 50 year old female, she is consolation of symptoms, see below. Dominant symptoms are allodynia, depressed mood with tearfulness, poor energy. Aches and pains. I do think we are dealing with fibromyalgia here, she is on Lexapro 20, not sufficiently efficacious, I do think she needs an SNRI so we will do a Lexapro down taper over 2 weeks and we will also go ahead and start Cymbalta now. We will do a 6-week titration

## 2021-02-24 NOTE — Telephone Encounter (Signed)
Transition Care Management Unsuccessful Follow-up Telephone Call  Date of discharge and from where:  02/23/21 from [facility]  Attempts:  1st Attempt  Reason for unsuccessful TCM follow-up call:  Left voice message Patient needs a hospital follow up within three days. She was discharged on 02/23/21.

## 2021-02-25 ENCOUNTER — Telehealth: Payer: Self-pay

## 2021-02-25 ENCOUNTER — Telehealth: Payer: Self-pay | Admitting: *Deleted

## 2021-02-25 DIAGNOSIS — K589 Irritable bowel syndrome without diarrhea: Secondary | ICD-10-CM

## 2021-02-25 NOTE — Telephone Encounter (Signed)
Per 02/23/21 los - called and gave upcoming appointments - confirmed

## 2021-02-25 NOTE — Telephone Encounter (Signed)
Medication: esomeprazole (NEXIUM) 40 MG capsule Prior authorization submitted to [facility] on the phone on 02/25/2021 PA submission pending  PA reference #[id number]

## 2021-02-26 ENCOUNTER — Telehealth: Payer: Self-pay

## 2021-02-26 MED ORDER — OMEPRAZOLE 40 MG PO CPDR: 40.0000 mg | DELAYED_RELEASE_CAPSULE | Freq: Two times a day (BID) | ORAL | 3 refills | Status: DC

## 2021-02-26 NOTE — Telephone Encounter (Signed)
Medication: esomeprazole (NEXIUM) 40 MG capsule Prior authorization determination received The representative I spoke with yesterday ran the PA request through as omeprazole 40 mg which was cancelled because this Rx is covered under her plan.  Called and spoke with Sarah Nicholson at [employer] to file a PA with the correct me. She said the PA was denied because it is a complete exclusion from the plan. She will fax over the denial letter that will provide all of the information needed for an appeal  Appeal Case [id number]

## 2021-02-26 NOTE — Telephone Encounter (Signed)
Transition Care Management Follow-up Telephone Call Date of discharge and from where: 02/23/21 from [facility] How have you been since you were released from the hospital? Patient had OV with Dr. Darene Lamer on 02/24/21. Any questions or concerns? No

## 2021-02-26 NOTE — Telephone Encounter (Addendum)
Faythe Ghee looks like I will just be switching to omeprazole 40 twice daily.  Please let Milderd know.

## 2021-02-26 NOTE — Telephone Encounter (Signed)
Medication: omeprazole (PRILOSEC) 40 MG capsule Prior authorization submitted via CoverMyMeds on 02/26/2021 PA submission pending

## 2021-02-26 NOTE — Addendum Note (Signed)
Addended by: Silverio Decamp on: 02/26/2021 09:52 AM   Modules accepted: Orders

## 2021-02-27 ENCOUNTER — Other Ambulatory Visit: Payer: Self-pay

## 2021-02-27 ENCOUNTER — Ambulatory Visit (INDEPENDENT_AMBULATORY_CARE_PROVIDER_SITE_OTHER): Payer: 59

## 2021-02-27 DIAGNOSIS — M47816 Spondylosis without myelopathy or radiculopathy, lumbar region: Secondary | ICD-10-CM

## 2021-02-27 DIAGNOSIS — M545 Low back pain, unspecified: Secondary | ICD-10-CM | POA: Diagnosis not present

## 2021-03-04 ENCOUNTER — Ambulatory Visit (INDEPENDENT_AMBULATORY_CARE_PROVIDER_SITE_OTHER): Payer: 59

## 2021-03-04 ENCOUNTER — Other Ambulatory Visit: Payer: Self-pay

## 2021-03-04 ENCOUNTER — Ambulatory Visit (INDEPENDENT_AMBULATORY_CARE_PROVIDER_SITE_OTHER): Payer: 59 | Admitting: Sports Medicine

## 2021-03-04 DIAGNOSIS — R1900 Intra-abdominal and pelvic swelling, mass and lump, unspecified site: Secondary | ICD-10-CM

## 2021-03-04 DIAGNOSIS — K4190 Unilateral femoral hernia, without obstruction or gangrene, not specified as recurrent: Secondary | ICD-10-CM | POA: Insufficient documentation

## 2021-03-04 MED ORDER — IOHEXOL 300 MG/ML  SOLN
100.0000 mL | Freq: Once | INTRAMUSCULAR | Status: AC | PRN
  Administered 2021-03-04: 14:00:00 100 mL via INTRAVENOUS

## 2021-03-04 NOTE — Progress Notes (Signed)
° ° °  Procedures performed today:    None.  Independent interpretation of notes and tests performed by another provider:   None.  Brief History, Exam, Impression, and Recommendations:    Mass of pelvis Sarah Nicholson returns, she is a pleasant 50 year old female, she does have a history of an inguinal hernia post open repair back in 2017. She is unfortunately starting to have increasing discomfort in the right lower pelvis, worse with Valsalva. She does feel a protrusion. I can also feel a protrusion just lateral to the labia majora. Due to a palpable mass as well as history of hernia surgery we will proceed with CT of the pelvis with and without contrast, oral and IV. I would also like a consultation with general surgery for reevaluation.    ___________________________________________ [name], M.D., ABFM., CAQSM. Primary Care and [place] Instructor of [place] of [facility] of Medicine

## 2021-03-04 NOTE — Assessment & Plan Note (Signed)
Sarah Nicholson returns, she is a pleasant 50 year old female, she does have a history of an inguinal hernia post open repair back in 2017. She is unfortunately starting to have increasing discomfort in the right lower pelvis, worse with Valsalva. She does feel a protrusion. I can also feel a protrusion just lateral to the labia majora. Due to a palpable mass as well as history of hernia surgery we will proceed with CT of the pelvis with and without contrast, oral and IV. I would also like a consultation with general surgery for reevaluation.

## 2021-03-17 ENCOUNTER — Other Ambulatory Visit: Payer: Self-pay

## 2021-03-17 MED ORDER — ENALAPRIL MALEATE 10 MG PO TABS: 10.0000 mg | ORAL_TABLET | Freq: Two times a day (BID) | ORAL | 1 refills | Status: DC

## 2021-03-29 ENCOUNTER — Other Ambulatory Visit: Payer: Self-pay | Admitting: Physician Assistant

## 2021-03-29 DIAGNOSIS — F410 Panic disorder [episodic paroxysmal anxiety] without agoraphobia: Secondary | ICD-10-CM

## 2021-03-29 NOTE — Telephone Encounter (Signed)
Dr. Darene Lamer patient

## 2021-04-07 ENCOUNTER — Ambulatory Visit: Payer: 59 | Admitting: Sports Medicine

## 2021-04-16 ENCOUNTER — Ambulatory Visit (INDEPENDENT_AMBULATORY_CARE_PROVIDER_SITE_OTHER): Payer: 59 | Admitting: Sports Medicine

## 2021-04-16 ENCOUNTER — Other Ambulatory Visit: Payer: Self-pay

## 2021-04-16 DIAGNOSIS — K4190 Unilateral femoral hernia, without obstruction or gangrene, not specified as recurrent: Secondary | ICD-10-CM | POA: Diagnosis not present

## 2021-04-16 DIAGNOSIS — F32A Depression, unspecified: Secondary | ICD-10-CM | POA: Diagnosis not present

## 2021-04-16 DIAGNOSIS — M47816 Spondylosis without myelopathy or radiculopathy, lumbar region: Secondary | ICD-10-CM

## 2021-04-16 DIAGNOSIS — F411 Generalized anxiety disorder: Secondary | ICD-10-CM

## 2021-04-16 MED ORDER — ESCITALOPRAM OXALATE 20 MG PO TABS: ORAL_TABLET | ORAL | 3 refills | Status: DC

## 2021-04-16 NOTE — Progress Notes (Signed)
° ° °  Procedures performed today:    None.  Independent interpretation of notes and tests performed by another provider:   None.  Brief History, Exam, Impression, and Recommendations:    Depression Boni returns, we switched her from Lexapro 20 to Cymbalta in the hopes of improving both her depression symptoms and fibromyalgia symptoms, unfortunately her depression symptoms have worsened and she would like to go back to Lexapro which I think is fine, she continues to have significant allodynia, depressed mood, irritability, tearfulness, poor energy. Discontinue Cymbalta, start Lexapro 10 daily for 5 days then 20 mg daily.  Femoral hernia We diagnosed a protrusion just lateral to the labia majora concerning for a femoral hernia on the right side at the last visit, CT was unrevealing, she was referred to general surgery for repair, this is now doing a lot better. She is 2 weeks postop and still having some swelling and discomfort but I have encouraged her and she is happy with how things have improved.  Lumbar spondylosis Symptom wise having left-sided low back pain with radiation into the hip and buttock. MRI did show multilevel lumbar facet arthritis, there was a small L4-L5 disc protrusion as well. The plan was left-sided L3-S1 facet joint injections, she will let me know if she hurts enough to do these.  Chronic process with exacerbation and pharmacologic intervention  ___________________________________________ [name], M.D., ABFM., CAQSM. Primary Care and [place] Instructor of [place] of [facility] of Medicine

## 2021-04-16 NOTE — Assessment & Plan Note (Signed)
Sarah Nicholson returns, we switched her from Lexapro 20 to Cymbalta in the hopes of improving both her depression symptoms and fibromyalgia symptoms, unfortunately her depression symptoms have worsened and she would like to go back to Lexapro which I think is fine, she continues to have significant allodynia, depressed mood, irritability, tearfulness, poor energy. Discontinue Cymbalta, start Lexapro 10 daily for 5 days then 20 mg daily.

## 2021-04-16 NOTE — Assessment & Plan Note (Signed)
We diagnosed a protrusion just lateral to the labia majora concerning for a femoral hernia on the right side at the last visit, CT was unrevealing, she was referred to general surgery for repair, this is now doing a lot better. She is 2 weeks postop and still having some swelling and discomfort but I have encouraged her and she is happy with how things have improved.

## 2021-04-16 NOTE — Assessment & Plan Note (Signed)
Symptom wise having left-sided low back pain with radiation into the hip and buttock. MRI did show multilevel lumbar facet arthritis, there was a small L4-L5 disc protrusion as well. The plan was left-sided L3-S1 facet joint injections, she will let me know if she hurts enough to do these.

## 2021-05-05 ENCOUNTER — Ambulatory Visit (INDEPENDENT_AMBULATORY_CARE_PROVIDER_SITE_OTHER): Payer: 59

## 2021-05-05 ENCOUNTER — Ambulatory Visit (INDEPENDENT_AMBULATORY_CARE_PROVIDER_SITE_OTHER): Payer: 59 | Admitting: Sports Medicine

## 2021-05-05 ENCOUNTER — Other Ambulatory Visit: Payer: Self-pay

## 2021-05-05 VITALS — BP 151/86 | HR 71 | Wt 222.0 lb

## 2021-05-05 DIAGNOSIS — K4190 Unilateral femoral hernia, without obstruction or gangrene, not specified as recurrent: Secondary | ICD-10-CM

## 2021-05-05 DIAGNOSIS — F32A Depression, unspecified: Secondary | ICD-10-CM | POA: Diagnosis not present

## 2021-05-05 DIAGNOSIS — M7062 Trochanteric bursitis, left hip: Secondary | ICD-10-CM

## 2021-05-05 NOTE — Assessment & Plan Note (Signed)
Sarah Nicholson returns postop as well, she had a femoral hernia repair on the right approximately 5 weeks ago. She had significant swelling in the labia majora postop, she understood that this was normal, she also has some numbness right labia majora, I explained this was also normal this soon after surgery. I did examine her with a female chaperone, there is a small nodule at the top of the labia majora on the right, minimally tender, per Sarah Nicholson this has improved from prior, I think this is just a hematoma and we should watch this until 3 months postop before seeking additional intervention. She is going to request additional time out of work from her Psychologist, sport and exercise.

## 2021-05-05 NOTE — Assessment & Plan Note (Signed)
This is a pleasant 51 year old female, she has history of trochanteric bursitis on the left, which resolved after injection in September 2022. 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 Today we injected her hip joint, if insufficient of her we will proceed with MR arthrography.

## 2021-05-05 NOTE — Assessment & Plan Note (Signed)
Dramatic improvement with switching back to Lexapro 20, no changes in plan for now.

## 2021-05-05 NOTE — Progress Notes (Signed)
° ° °  Procedures performed today:    Procedure: Real-time Ultrasound Guided injection of the left hip joint Device: Samsung HS60  Verbal informed consent obtained.  Time-out conducted.  Noted no overlying erythema, induration, or other signs of local infection.  Skin prepped in a sterile fashion.  Local anesthesia: Topical Ethyl chloride.  With sterile technique and under real time ultrasound guidance: Noted trace effusion, 1 cc Kenalog 40, 2 cc lidocaine, 2 cc bupivacaine injected easily Completed without difficulty  Advised to call if fevers/chills, erythema, induration, drainage, or persistent bleeding.  Images permanently stored and available for review in PACS.  Impression: Technically successful ultrasound guided injection.  Independent interpretation of notes and tests performed by another provider:   None.  Brief History, Exam, Impression, and Recommendations:    Trochanteric bursitis, left hip This is a pleasant 51 year old female, she has history of trochanteric bursitis on the left, which resolved after injection in September 2022. 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 Today we injected her hip joint, if insufficient of her we will proceed with MR arthrography.  Depression Dramatic improvement with switching back to Lexapro 20, no changes in plan for now.  Femoral hernia Lua returns postop as well, she had a femoral hernia repair on the right approximately 5 weeks ago. She had significant swelling in the labia majora postop, she understood that this was normal, she also has some numbness right labia majora, I explained this was also normal this soon after surgery. I did examine her with a female chaperone, there is a small nodule at the top of the labia majora  on the right, minimally tender, per Amarise this has improved from prior, I think this is just a hematoma and we should watch this until 3 months postop before seeking additional intervention. She is going to request additional time out of work from her Psychologist, sport and exercise.    ___________________________________________ [name], M.D., ABFM., CAQSM. Primary Care and [place] Instructor of [place] of [facility] of Medicine

## 2021-05-17 ENCOUNTER — Ambulatory Visit (INDEPENDENT_AMBULATORY_CARE_PROVIDER_SITE_OTHER): Payer: 59 | Admitting: Sports Medicine

## 2021-05-17 ENCOUNTER — Other Ambulatory Visit: Payer: Self-pay

## 2021-05-17 DIAGNOSIS — M7062 Trochanteric bursitis, left hip: Secondary | ICD-10-CM | POA: Diagnosis not present

## 2021-05-17 DIAGNOSIS — K4190 Unilateral femoral hernia, without obstruction or gangrene, not specified as recurrent: Secondary | ICD-10-CM | POA: Diagnosis not present

## 2021-05-17 NOTE — Assessment & Plan Note (Signed)
Hematoma has improved considerably, I filled out an extensive amount of disability paperwork and FMLA paperwork today, return as needed. ?

## 2021-05-17 NOTE — Progress Notes (Signed)
? ? ?  Procedures performed today:   ? ?None. ? ?Independent interpretation of notes and tests performed by another provider:  ? ?None. ? ?Brief History, Exam, Impression, and Recommendations:   ? ?Femoral hernia ?Hematoma has improved considerably, I filled out an extensive amount of disability paperwork and FMLA paperwork today, return as needed. ? ?Trochanteric bursitis, left hip ?History of trochanteric bursitis, at the last visit she had pain referable more to the joint, this was injected with ultrasound guidance and she returns today doing well. ?Next step would be MR arthrography if this returns. ? ?I spent 30 minutes of total time managing this patient today, this includes chart review, face to face, and non-face to face time.  Specifically we spent time filling out paperwork and discussing prognosis. ? ?___________________________________________ ?[name], M.D., ABFM., CAQSM. ?Primary Care and Sports Medicine ?[place] ? ?Adjunct Instructor of Family Medicine  ?[employer] of Medicine ?

## 2021-05-17 NOTE — Assessment & Plan Note (Signed)
History of trochanteric bursitis, at the last visit she had pain referable more to the joint, this was injected with ultrasound guidance and she returns today doing well. ?Next step would be MR arthrography if this returns. ?

## 2021-06-21 ENCOUNTER — Other Ambulatory Visit: Payer: Self-pay

## 2021-06-21 DIAGNOSIS — F411 Generalized anxiety disorder: Secondary | ICD-10-CM

## 2021-06-21 DIAGNOSIS — F32A Depression, unspecified: Secondary | ICD-10-CM

## 2021-06-21 MED ORDER — ENALAPRIL MALEATE 10 MG PO TABS: 10.0000 mg | ORAL_TABLET | Freq: Two times a day (BID) | ORAL | 1 refills | Status: DC

## 2021-06-21 MED ORDER — ESCITALOPRAM OXALATE 20 MG PO TABS: ORAL_TABLET | ORAL | 3 refills | Status: DC

## 2021-06-28 ENCOUNTER — Other Ambulatory Visit: Payer: Self-pay

## 2021-06-28 DIAGNOSIS — I10 Essential (primary) hypertension: Secondary | ICD-10-CM

## 2021-06-28 MED ORDER — HYDROCHLOROTHIAZIDE 12.5 MG PO TABS: ORAL_TABLET | ORAL | 1 refills | Status: DC

## 2021-07-14 ENCOUNTER — Ambulatory Visit (INDEPENDENT_AMBULATORY_CARE_PROVIDER_SITE_OTHER): Payer: 59 | Admitting: Sports Medicine

## 2021-07-14 DIAGNOSIS — M7062 Trochanteric bursitis, left hip: Secondary | ICD-10-CM

## 2021-07-14 DIAGNOSIS — F32A Depression, unspecified: Secondary | ICD-10-CM | POA: Diagnosis not present

## 2021-07-14 DIAGNOSIS — F411 Generalized anxiety disorder: Secondary | ICD-10-CM | POA: Diagnosis not present

## 2021-07-14 MED ORDER — ESCITALOPRAM OXALATE 20 MG PO TABS: 40.0000 mg | ORAL_TABLET | Freq: Every day | ORAL | 3 refills | Status: DC

## 2021-07-14 MED ORDER — AMPHETAMINE-DEXTROAMPHETAMINE 12.5 MG PO TABS: 12.5000 mg | ORAL_TABLET | Freq: Every day | ORAL | 0 refills | Status: DC

## 2021-07-14 NOTE — Progress Notes (Signed)
? ? ?  Procedures performed today:   ? ?None. ? ?Independent interpretation of notes and tests performed by another provider:  ? ?None. ? ?Brief History, Exam, Impression, and Recommendations:   ? ?Depression ?Sarah Nicholson did a lot better with the switch back to Lexapro 20, it sounds as though she was taking it at 40 mg daily which is technically higher than the maximum licensed dose, but it seems to work well for Nicholson. ?On a literature search it looks like there was a pilot study from [facility] psychiatry in 2011 (PMID: 56387564) that investigated doses up to 50 mg, doses up to 50 mg are safe, but tolerability does become a concern at higher dose, as she is tolerating it well at 40 mg we will continue this. ? ?Trochanteric bursitis, left hip ?Sarah Nicholson also has persistent left hip pain, we did a hip joint injection back in February and she did have excellent relief, pain today is predominantly lateral and posterior, I do suspect more of a trochanteric bursitis/gluteus medius tendinitis type picture, I did explain to Nicholson that formal physical therapy was the key here, so we will do this for 4 to 6 weeks before considering an MRI. ? ?Chronic process with exacerbation and pharmacologic intervention ? ?___________________________________________ ?Sarah Nicholson. Dianah Field, M.D., ABFM., CAQSM. ?Primary Care and Sports Medicine ?[place] ? ?Adjunct Instructor of Family Medicine  ?[employer] ?

## 2021-07-14 NOTE — Assessment & Plan Note (Signed)
Sarah Nicholson did a lot better with the switch back to Lexapro 20, it sounds as though she was taking it at 40 mg daily which is technically higher than the maximum licensed dose, but it seems to work well for her. ?On a literature search it looks like there was a pilot study from [facility] psychiatry in 2011 (PMID: 04136438) that investigated doses up to 50 mg, doses up to 50 mg are safe, but tolerability does become a concern at higher dose, as she is tolerating it well at 40 mg we will continue this. ?

## 2021-07-14 NOTE — Assessment & Plan Note (Signed)
Sarah Nicholson also has persistent left hip pain, we did a hip joint injection back in February and she did have excellent relief, pain today is predominantly lateral and posterior, I do suspect more of a trochanteric bursitis/gluteus medius tendinitis type picture, I did explain to her that formal physical therapy was the key here, so we will do this for 4 to 6 weeks before considering an MRI. ?

## 2021-07-28 ENCOUNTER — Emergency Department (INDEPENDENT_AMBULATORY_CARE_PROVIDER_SITE_OTHER): Payer: 59

## 2021-07-28 ENCOUNTER — Ambulatory Visit: Payer: 59 | Attending: Sports Medicine | Admitting: Rehabilitative and Restorative Service Providers"

## 2021-07-28 ENCOUNTER — Emergency Department: Admit: 2021-07-28 | Discharge status: Absent without leave | Payer: Self-pay

## 2021-07-28 ENCOUNTER — Emergency Department (INDEPENDENT_AMBULATORY_CARE_PROVIDER_SITE_OTHER)
Admission: EM | Admit: 2021-07-28 | Discharge: 2021-07-28 | Disposition: A | Discharge status: Home / Self Care | Payer: 59 | Source: Home / Self Care | Attending: Family Medicine | Admitting: Family Medicine

## 2021-07-28 DIAGNOSIS — R319 Hematuria, unspecified: Secondary | ICD-10-CM

## 2021-07-28 DIAGNOSIS — R109 Unspecified abdominal pain: Secondary | ICD-10-CM

## 2021-07-28 DIAGNOSIS — R1031 Right lower quadrant pain: Secondary | ICD-10-CM | POA: Diagnosis not present

## 2021-07-28 DIAGNOSIS — R110 Nausea: Secondary | ICD-10-CM | POA: Diagnosis not present

## 2021-07-28 LAB — POCT URINALYSIS DIP (MANUAL ENTRY)
Bilirubin, UA: NEGATIVE
Glucose, UA: NEGATIVE mg/dL
Ketones, POC UA: NEGATIVE mg/dL
Leukocytes, UA: NEGATIVE
Nitrite, UA: NEGATIVE
Protein Ur, POC: NEGATIVE mg/dL
Spec Grav, UA: 1.01 (ref 1.010–1.025)
Urobilinogen, UA: 0.2 E.U./dL
pH, UA: 6 (ref 5.0–8.0)

## 2021-07-28 MED ORDER — OXYCODONE-ACETAMINOPHEN 5-325 MG PO TABS: 1.0000 | ORAL_TABLET | Freq: Three times a day (TID) | ORAL | 0 refills | Status: DC | PRN

## 2021-07-28 MED ORDER — ONDANSETRON 8 MG PO TBDP: 8.0000 mg | ORAL_TABLET | Freq: Three times a day (TID) | ORAL | 0 refills | Status: DC | PRN

## 2021-07-28 MED ORDER — ONDANSETRON 4 MG PO TBDP
4.0000 mg | ORAL_TABLET | Freq: Once | ORAL | Status: AC
  Administered 2021-07-28: 4 mg via ORAL

## 2021-07-28 MED ORDER — CIPROFLOXACIN HCL 500 MG PO TABS: 500.0000 mg | ORAL_TABLET | Freq: Two times a day (BID) | ORAL | 0 refills | Status: DC

## 2021-07-28 MED ORDER — CEFTRIAXONE SODIUM 1 G IJ SOLR
1000.0000 mg | Freq: Once | INTRAMUSCULAR | Status: AC
  Administered 2021-07-28: 1000 mg via INTRAMUSCULAR

## 2021-07-28 MED ORDER — KETOROLAC TROMETHAMINE 60 MG/2ML IM SOLN
60.0000 mg | Freq: Once | INTRAMUSCULAR | Status: AC
  Administered 2021-07-28: 60 mg via INTRAMUSCULAR

## 2021-07-28 NOTE — ED Provider Notes (Signed)
?[facility] ? ? ? ?CSN: [id number] ?Arrival date & time: 07/28/21  1051 ? ? ?  ? ?History   ?Chief Complaint ?Chief Complaint  ?Patient presents with  ? Back Pain  ? Nausea  ? ? ?HPI ?Sarah Nicholson is a 51 y.o. female.  ? ?HPI ? ?Patient is here for right flank pain.  She thinks she may have a kidney infection.  She has had some nausea this morning.  No vomiting.  She states this has been gradually worsening for the last couple of days.  No dysuria or frequency.  No fever or chills.  She states she does have a history of hematuria.  She states when she has urinary infection she often will not have urinary symptoms.  She states that she does have a history of kidney stones but it was many years ago. ?No bending or lifting back strain suspicion ? ?Past Medical History:  ?Diagnosis Date  ? ADD (attention deficit disorder) 06/14/2012  ? Anxiety   ? Bronchitis   ? Complication of anesthesia 1987  ? states woke up during appedentomy  ? Depression 06/14/2012  ? Hepatic steatosis 11/13/2018  ? Hepatic steatosis with hepatomegaly seen on MRI abd on 11/2018  ? History of leukocytosis 06/14/2012  ? Reports "WBC 32" decades ago with negative hematology specialist workup.   ? Hyperlipidemia 06/14/2012  ? Hypertension   ? Seasonal allergies 06/14/2012  ? ? ?Patient Active Problem List  ? Diagnosis Date Noted  ? Femoral hernia 03/04/2021  ? Leukocytosis 01/16/2021  ? Left lateral abdominal pain 01/15/2021  ? Lumbar spondylosis 01/15/2021  ? Trochanteric bursitis, left hip 12/04/2020  ? Hepatic steatosis 11/13/2018  ? Tobacco use 07/18/2018  ? Irritable bowel syndrome (IBS) 05/04/2017  ? Hallux valgus 12/09/2014  ? Other and unspecified ovarian cyst 06/25/2013  ? Left tennis elbow 06/25/2013  ? Microscopic hematuria 06/27/2012  ? Hyperlipidemia 06/14/2012  ? Prediabetes 06/14/2012  ? Seasonal allergies 06/14/2012  ? Depression 06/14/2012  ? ADD (attention deficit disorder) 06/14/2012  ? ? ?Past Surgical History:   ?Procedure Laterality Date  ? APPENDECTOMY    ? BICEPT TENODESIS Right 10/10/2019  ? Procedure: BICEPS TENODESIS;  Surgeon: Hiram Gash, MD;  Location: [place];  Service: Orthopedics;  Laterality: Right;  ? bladder sling/mesh    ? INGUINAL HERNIA REPAIR    ? intrauterine ablation    ? NASAL SINUS SURGERY    ? TUBAL LIGATION    ? ? ?OB History   ? ? Gravida  ?5  ? Para  ?4  ? Term  ?2  ? Preterm  ?2  ? AB  ?1  ? Living  ?3  ?  ? ? SAB  ?   ? IAB  ?   ? Ectopic  ?   ? Multiple  ?   ? Live Births  ?   ?   ?  ?  ? ? ? ?Home Medications   ? ?Prior to Admission medications   ?Medication Sig Start Date End Date Taking? Authorizing Provider  ?ciprofloxacin (CIPRO) 500 MG tablet Take 1 tablet (500 mg total) by mouth 2 (two) times daily. 07/28/21  Yes Raylene Everts, MD  ?ondansetron (ZOFRAN-ODT) 8 MG disintegrating tablet Take 1 tablet (8 mg total) by mouth every 8 (eight) hours as needed for nausea or vomiting. 07/28/21  Yes Raylene Everts, MD  ?oxyCODONE-acetaminophen (PERCOCET/ROXICET) 5-325 MG tablet Take 1-2 tablets by mouth every 8 (eight) hours  as needed for severe pain. 07/28/21  Yes Raylene Everts, MD  ?amphetamine-dextroamphetamine (ADDERALL) 12.5 MG tablet Take 1 tablet by mouth daily. 07/14/21   Silverio Decamp, MD  ?cetirizine (ZYRTEC) 10 MG tablet Take 20 mg by mouth daily.    [provider]  ?clonazePAM (KLONOPIN) 0.5 MG tablet TAKE 1 TABLET(0.5 MG) BY MOUTH THREE TIMES DAILY AS NEEDED FOR ANXIETY 03/29/21   Silverio Decamp, MD  ?cloNIDine (CATAPRES) 0.1 MG tablet Take 1 tablet (0.1 mg total) by mouth 3 (three) times daily as needed (systolic blood pressure >086). 03/11/20   Emeterio Reeve, DO  ?enalapril (VASOTEC) 10 MG tablet Take 1 tablet (10 mg total) by mouth 2 (two) times daily. 06/21/21   Silverio Decamp, MD  ?EPINEPHrine 0.3 mg/0.3 mL IJ SOAJ injection INJECT INTRAMUSCULARLY AS DIRECTED 09/21/20   Emeterio Reeve, DO  ?escitalopram (LEXAPRO)  20 MG tablet Take 2 tablets (40 mg total) by mouth daily. 07/14/21   Silverio Decamp, MD  ?hydrochlorothiazide (HYDRODIURIL) 12.5 MG tablet TAKE 1 TABLET(12.5 MG) BY MOUTH DAILY 06/28/21   Silverio Decamp, MD  ?omeprazole (PRILOSEC) 40 MG capsule Take 1 capsule (40 mg total) by mouth in the morning and at bedtime. 02/26/21   Silverio Decamp, MD  ?varenicline (CHANTIX) 1 MG tablet Take 1 tablet (1 mg total) by mouth 2 (two) times daily. 01/27/21   Silverio Decamp, MD  ? ? ?Family History ?Family History  ?Problem Relation Age of Onset  ? Depression Mother   ? Diabetes Father   ? Hyperlipidemia Father   ? Hypertension Father   ? Depression Sister   ? Breast cancer Maternal Grandmother   ? Depression Son   ? ? ?Social History ?Social History  ? ?Tobacco Use  ? Smoking status: Every Day  ?  Packs/day: 0.50  ?  Types: Cigarettes  ? Smokeless tobacco: Never  ? Tobacco comments:  ?  30 years , currently on Chantix  ?Vaping Use  ? Vaping Use: Never used  ?Substance Use Topics  ? Alcohol use: Yes  ?  Comment: 1-2 drinks every 6 months  ? Drug use: No  ? ? ? ?Allergies   ?Macrobid [nitrofurantoin macrocrystal], Macrobid [nitrofurantoin], Other, Tetanus toxoids, and Thimerosal ? ? ?Review of Systems ?Review of Systems ?See HPI ? ?Physical Exam ?Triage Vital Signs ?ED Triage Vitals  ?Enc Vitals Group  ?   BP 07/28/21 1106 (!) 160/98  ?   Pulse Rate 07/28/21 1106 71  ?   Resp 07/28/21 1106 18  ?   Temp 07/28/21 1106 98 ?F (36.7 ?C)  ?   Temp Source 07/28/21 1106 Oral  ?   SpO2 07/28/21 1106 95 %  ?   Weight --   ?   Height --   ?   Head Circumference --   ?   Peak Flow --   ?   Pain Score 07/28/21 1107 7  ?   Pain Loc --   ?   Pain Edu? --   ?   Excl. in [place]? --   ? ?No data found. ? ?Updated Vital Signs ?BP (!) 160/98 (BP Location: Right Arm)   Pulse 71   Temp 98 ?F (36.7 ?C) (Oral)   Resp 18   SpO2 95%  ? ?Visual Acuity ?Right Eye Distance:   ?Left Eye Distance:   ?Bilateral Distance:   ? ?Right Eye  Near:   ?Left Eye Near:    ?Bilateral Near:    ? ?  Physical Exam ?Constitutional:   ?   General: She is in acute distress.  ?   Appearance: She is well-developed.  ?   Comments: Patient is acutely uncomfortable, holding her right mid back  ?HENT:  ?   Head: Normocephalic and atraumatic.  ?Eyes:  ?   Conjunctiva/sclera: Conjunctivae normal.  ?   Pupils: Pupils are equal, round, and reactive to light.  ?Cardiovascular:  ?   Rate and Rhythm: Normal rate and regular rhythm.  ?   Heart sounds: Normal heart sounds.  ?Pulmonary:  ?   Effort: Pulmonary effort is normal. No respiratory distress.  ?   Breath sounds: Normal breath sounds.  ?Abdominal:  ?   General: There is no distension.  ?   Palpations: Abdomen is soft.  ?   Tenderness: There is abdominal tenderness. There is right CVA tenderness. There is no left CVA tenderness.  ?   Comments: Tenderness to deep palpation in the right middle abdomen.  No palpation of liver edge.  CVA has tenderness on the right  ?Musculoskeletal:     ?   General: Normal range of motion.  ?   Cervical back: Normal range of motion.  ?Skin: ?   General: Skin is warm and dry.  ?Neurological:  ?   Mental Status: She is alert.  ?Psychiatric:     ?   Mood and Affect: Mood normal.     ?   Behavior: Behavior normal.  ? ? ? ?UC Treatments / Results  ?Labs ?(all labs ordered are listed, but only abnormal results are displayed) ?Labs Reviewed  ?POCT URINALYSIS DIP (MANUAL ENTRY) - Abnormal; Notable for the following components:  ?    Result Value  ? Blood, UA moderate (*)   ? All other components within normal limits  ?URINE CULTURE  ? ? ?EKG ? ? ?Radiology ?CT Renal Stone Study ? ?Result Date: 07/28/2021 ?CLINICAL DATA:  Flank pain, kidney stone suspected right flank pain and hematuria EXAM: CT ABDOMEN AND PELVIS WITHOUT CONTRAST TECHNIQUE: Multidetector CT imaging of the abdomen and pelvis was performed following the standard protocol without IV contrast. RADIATION DOSE REDUCTION: This exam was  performed according to the departmental dose-optimization program which includes automated exposure control, adjustment of the mA and/or kV according to patient size and/or use of iterative reconstruction techn

## 2021-07-28 NOTE — ED Triage Notes (Signed)
Pt c/o back pain x 2 days. Worsening in last day. Nausea today. Denies urinary sxs. Hx of kidney stones. Pain 7/10 Ibuprofen prn.  ?

## 2021-07-28 NOTE — Discharge Instructions (Signed)
Drink lots of water ?Take oxycodone as needed for severe pain.  If the oxycodone causes itching you may take Benadryl with it.  Caution sedation ?Do not drive on oxycodone ?Take the Cipro antibiotic 2 times a day ?Use Zofran if needed for nausea ?Your urine culture report will be available in 2 to 3 days on MyChart.  You will be called if any change in antibiotics is needed ?If you get worse instead of better, you must go to the emergency room for additional testing and treatment ?

## 2021-07-29 LAB — URINE CULTURE
MICRO NUMBER:: 13408767
SPECIMEN QUALITY:: ADEQUATE

## 2021-08-01 ENCOUNTER — Other Ambulatory Visit: Payer: Self-pay | Admitting: Sports Medicine

## 2021-08-01 DIAGNOSIS — F988 Other specified behavioral and emotional disorders with onset usually occurring in childhood and adolescence: Secondary | ICD-10-CM

## 2021-08-02 ENCOUNTER — Emergency Department (INDEPENDENT_AMBULATORY_CARE_PROVIDER_SITE_OTHER)
Admission: RE | Admit: 2021-08-02 | Discharge: 2021-08-02 | Disposition: A | Discharge status: Home / Self Care | Payer: 59 | Source: Ambulatory Visit | Attending: Family Medicine | Admitting: Family Medicine

## 2021-08-02 VITALS — BP 139/87 | HR 89 | Temp 98.1°F | Resp 14 | Wt 206.0 lb

## 2021-08-02 DIAGNOSIS — B029 Zoster without complications: Secondary | ICD-10-CM

## 2021-08-02 MED ORDER — AMPHETAMINE-DEXTROAMPHETAMINE 10 MG PO TABS: 10.0000 mg | ORAL_TABLET | Freq: Every day | ORAL | 0 refills | Status: DC

## 2021-08-02 MED ORDER — GABAPENTIN 300 MG PO CAPS: 300.0000 mg | ORAL_CAPSULE | Freq: Three times a day (TID) | ORAL | 0 refills | Status: DC

## 2021-08-02 MED ORDER — VALACYCLOVIR HCL 1 G PO TABS: 1000.0000 mg | ORAL_TABLET | Freq: Three times a day (TID) | ORAL | 0 refills | Status: DC

## 2021-08-02 NOTE — Assessment & Plan Note (Signed)
Historically well controlled on Adderall 12.5 mg daily. Sounds like the pharmacy does not have this dose so we will switch to 10 per pharmacy request.

## 2021-08-02 NOTE — Discharge Instructions (Signed)
Take valacyclovir 3 times a day for a week Take 3 doses today Take gabapentin for pain.  May take up to 3 times a day.  This may cause drowsiness May use any cream desired on the rash See your doctor if not improving by the end of the week

## 2021-08-02 NOTE — ED Provider Notes (Signed)
[facility]    CSN: [id number] Arrival date & time: 08/02/21  0943      History   Chief Complaint Chief Complaint  Patient presents with   Follow-up    I was in on Wednesday with flank pain thinking it was a kidney infection. I have tingling, burning, numbness, itching, and a couple of spots that wrap around my flank as well as the deep pain. Checking to see if it's the beginning of shingles. - Entered by patient   Back Pain   Rash    HPI Sarah Nicholson is a 51 y.o. female.   HPI  I saw patient last week for flank pain.  She states that she did not develop some rash in the area of her flank pain.  She is concerned she may have shingles.  She states she has not gotten better.  There was concern she may have been having symptoms from kidney infection.  Was started on Cipro.  Her culture ended up negative.  I told her to stop the Cipro. Has never had shingles shots  Past Medical History:  Diagnosis Date   ADD (attention deficit disorder) 06/14/2012   Anxiety    Bronchitis    Complication of anesthesia 1987   states woke up during appedentomy   Depression 06/14/2012   Hepatic steatosis 11/13/2018   Hepatic steatosis with hepatomegaly seen on MRI abd on 11/2018   History of leukocytosis 06/14/2012   Reports "WBC 32" decades ago with negative hematology specialist workup.    Hyperlipidemia 06/14/2012   Hypertension    Seasonal allergies 06/14/2012    Patient Active Problem List   Diagnosis Date Noted   Femoral hernia 03/04/2021   Leukocytosis 01/16/2021   Left lateral abdominal pain 01/15/2021   Lumbar spondylosis 01/15/2021   Trochanteric bursitis, left hip 12/04/2020   Hepatic steatosis 11/13/2018   Tobacco use 07/18/2018   Irritable bowel syndrome (IBS) 05/04/2017   Hallux valgus 12/09/2014   Other and unspecified ovarian cyst 06/25/2013   Left tennis elbow 06/25/2013   Microscopic hematuria 06/27/2012   Hyperlipidemia 06/14/2012   Prediabetes  06/14/2012   Seasonal allergies 06/14/2012   Depression 06/14/2012   ADD (attention deficit disorder) 06/14/2012    Past Surgical History:  Procedure Laterality Date   APPENDECTOMY     BICEPT TENODESIS Right 10/10/2019   Procedure: BICEPS TENODESIS;  Surgeon: Hiram Gash, MD;  Location: [place];  Service: Orthopedics;  Laterality: Right;   bladder sling/mesh     INGUINAL HERNIA REPAIR     intrauterine ablation     NASAL SINUS SURGERY     TUBAL LIGATION      OB History     Gravida  5   Para  4   Term  2   Preterm  2   AB  1   Living  3      SAB      IAB      Ectopic      Multiple      Live Births               Home Medications    Prior to Admission medications   Medication Sig Start Date End Date Taking? Authorizing Provider  gabapentin (NEURONTIN) 300 MG capsule Take 1 capsule (300 mg total) by mouth 3 (three) times daily. 08/02/21  Yes Raylene Everts, MD  valACYclovir (VALTREX) 1000 MG tablet Take 1 tablet (1,000 mg total) by mouth 3 (  three) times daily. 08/02/21  Yes Raylene Everts, MD  amphetamine-dextroamphetamine (ADDERALL) 12.5 MG tablet Take 1 tablet by mouth daily. 07/14/21   Silverio Decamp, MD  cetirizine (ZYRTEC) 10 MG tablet Take 20 mg by mouth daily.    [provider]  ciprofloxacin (CIPRO) 500 MG tablet Take 1 tablet (500 mg total) by mouth 2 (two) times daily. 07/28/21   Raylene Everts, MD  clonazePAM (KLONOPIN) 0.5 MG tablet TAKE 1 TABLET(0.5 MG) BY MOUTH THREE TIMES DAILY AS NEEDED FOR ANXIETY 03/29/21   Silverio Decamp, MD  cloNIDine (CATAPRES) 0.1 MG tablet Take 1 tablet (0.1 mg total) by mouth 3 (three) times daily as needed (systolic blood pressure >950). 03/11/20   Emeterio Reeve, DO  enalapril (VASOTEC) 10 MG tablet Take 1 tablet (10 mg total) by mouth 2 (two) times daily. 06/21/21   Silverio Decamp, MD  EPINEPHrine 0.3 mg/0.3 mL IJ SOAJ injection INJECT INTRAMUSCULARLY AS  DIRECTED 09/21/20   Emeterio Reeve, DO  escitalopram (LEXAPRO) 20 MG tablet Take 2 tablets (40 mg total) by mouth daily. 07/14/21   Silverio Decamp, MD  hydrochlorothiazide (HYDRODIURIL) 12.5 MG tablet TAKE 1 TABLET(12.5 MG) BY MOUTH DAILY 06/28/21   Silverio Decamp, MD  omeprazole (PRILOSEC) 40 MG capsule Take 1 capsule (40 mg total) by mouth in the morning and at bedtime. 02/26/21   Silverio Decamp, MD  ondansetron (ZOFRAN-ODT) 8 MG disintegrating tablet Take 1 tablet (8 mg total) by mouth every 8 (eight) hours as needed for nausea or vomiting. 07/28/21   Raylene Everts, MD  oxyCODONE-acetaminophen (PERCOCET/ROXICET) 5-325 MG tablet Take 1-2 tablets by mouth every 8 (eight) hours as needed for severe pain. Patient not taking: Reported on 08/02/2021 07/28/21   Raylene Everts, MD  varenicline (CHANTIX) 1 MG tablet Take 1 tablet (1 mg total) by mouth 2 (two) times daily. 01/27/21   Silverio Decamp, MD    Family History Family History  Problem Relation Age of Onset   Depression Mother    Diabetes Father    Hyperlipidemia Father    Hypertension Father    Depression Sister    Breast cancer Maternal Grandmother    Depression Son     Social History Social History   Tobacco Use   Smoking status: Every Day    Packs/day: 0.50    Types: Cigarettes   Smokeless tobacco: Never   Tobacco comments:    30 years , currently on Chantix  Vaping Use   Vaping Use: Never used  Substance Use Topics   Alcohol use: Yes    Comment: 1-2 drinks every 6 months   Drug use: No     Allergies   Macrobid [nitrofurantoin macrocrystal], Macrobid [nitrofurantoin], Other, Tetanus toxoids, and Thimerosal   Review of Systems Review of Systems See hpi  Physical Exam Triage Vital Signs ED Triage Vitals  Enc Vitals Group     BP 08/02/21 0949 139/87     Pulse Rate 08/02/21 0949 89     Resp 08/02/21 0949 14     Temp 08/02/21 0949 98.1 F (36.7 C)     Temp Source 08/02/21  0949 Oral     SpO2 08/02/21 0949 96 %     Weight 08/02/21 0953 206 lb (93.4 kg)     Height --      Head Circumference --      Peak Flow --      Pain Score 08/02/21 0953 6  Pain Loc --      Pain Edu? --      Excl. in [place]? --    No data found.  Updated Vital Signs BP 139/87 (BP Location: Right Arm)   Pulse 89   Temp 98.1 F (36.7 C) (Oral)   Resp 14   Wt 93.4 kg   SpO2 96%   BMI 33.25 kg/m       Physical Exam Constitutional:      General: She is in acute distress.     Appearance: She is well-developed.     Comments: Appears uncomfortable  HENT:     Head: Normocephalic and atraumatic.  Eyes:     Conjunctiva/sclera: Conjunctivae normal.     Pupils: Pupils are equal, round, and reactive to light.  Cardiovascular:     Rate and Rhythm: Normal rate.  Pulmonary:     Effort: Pulmonary effort is normal. No respiratory distress.  Abdominal:     General: There is no distension.     Palpations: Abdomen is soft.     Tenderness: There is no right CVA tenderness or left CVA tenderness.  Musculoskeletal:        General: Normal range of motion.     Cervical back: Normal range of motion.  Skin:    General: Skin is warm and dry.     Findings: Rash present.     Comments: There are isolated small red macules and a stripe around her right flank, 5 bumps in total.  Does not look like the classic shingles rash, however she does have a burning pain and hyperesthesia of the skin in this area.  It does appear dermatomal  Neurological:     Mental Status: She is alert.  Psychiatric:        Mood and Affect: Mood normal.        Behavior: Behavior normal.     UC Treatments / Results  Labs (all labs ordered are listed, but only abnormal results are displayed) Labs Reviewed - No data to display  EKG   Radiology No results found.  Procedures Procedures (including critical care time)  Medications Ordered in UC Medications - No data to display  Initial Impression / Assessment  and Plan / UC Course  I have reviewed the triage vital signs and the nursing notes.  Pertinent labs & imaging results that were available during my care of the patient were reviewed by me and considered in my medical decision making (see chart for details).     Final Clinical Impressions(s) / UC Diagnoses   Final diagnoses:  Herpes zoster without complication     Discharge Instructions      Take valacyclovir 3 times a day for a week Take 3 doses today Take gabapentin for pain.  May take up to 3 times a day.  This may cause drowsiness May use any cream desired on the rash See your doctor if not improving by the end of the week   ED Prescriptions     Medication Sig Dispense Auth. Provider   valACYclovir (VALTREX) 1000 MG tablet Take 1 tablet (1,000 mg total) by mouth 3 (three) times daily. 21 tablet Raylene Everts, MD   gabapentin (NEURONTIN) 300 MG capsule Take 1 capsule (300 mg total) by mouth 3 (three) times daily. 30 capsule Raylene Everts, MD      PDMP not reviewed this encounter.   Raylene Everts, MD 08/02/21 1120

## 2021-08-02 NOTE — ED Triage Notes (Signed)
Pt presents with continued back pain that has wrapped around to her abdomen. Pt states ares is also itchy and has began to have a rash. Pt concerned for shingles.

## 2021-08-03 ENCOUNTER — Ambulatory Visit: Payer: 59 | Admitting: Rehabilitative and Restorative Service Providers"

## 2021-08-06 ENCOUNTER — Ambulatory Visit (INDEPENDENT_AMBULATORY_CARE_PROVIDER_SITE_OTHER): Payer: 59 | Admitting: Sports Medicine

## 2021-08-06 DIAGNOSIS — B029 Zoster without complications: Secondary | ICD-10-CM | POA: Diagnosis not present

## 2021-08-06 NOTE — Progress Notes (Signed)
    Procedures performed today:    None.  Independent interpretation of notes and tests performed by another provider:   None.  Brief History, Exam, Impression, and Recommendations:    Herpes zoster Classic shingles right flank, went to urgent care, currently on Valtrex and gabapentin, FMLA filled out today, no further intervention needed.    ___________________________________________ [name], M.D., ABFM., CAQSM. Primary Care and Head of the [employer] [place] [facility]

## 2021-08-06 NOTE — Assessment & Plan Note (Signed)
Classic shingles right flank, went to urgent care, currently on Valtrex and gabapentin, FMLA filled out today, no further intervention needed.

## 2021-08-16 ENCOUNTER — Encounter: Payer: Self-pay | Admitting: Sports Medicine

## 2021-08-18 ENCOUNTER — Other Ambulatory Visit: Payer: Self-pay

## 2021-08-18 MED ORDER — GABAPENTIN 300 MG PO CAPS: 300.0000 mg | ORAL_CAPSULE | Freq: Three times a day (TID) | ORAL | 0 refills | Status: DC

## 2021-08-20 ENCOUNTER — Other Ambulatory Visit: Payer: Self-pay | Admitting: Sports Medicine

## 2021-08-20 MED ORDER — GABAPENTIN 300 MG PO CAPS
300.0000 mg | ORAL_CAPSULE | Freq: Three times a day (TID) | ORAL | 3 refills | Status: DC
Start: 2021-08-20 — End: 2022-11-02

## 2021-08-24 ENCOUNTER — Inpatient Hospital Stay: Payer: 59 | Attending: Hematology & Oncology

## 2021-08-24 ENCOUNTER — Inpatient Hospital Stay (HOSPITAL_BASED_OUTPATIENT_CLINIC_OR_DEPARTMENT_OTHER): Payer: 59 | Admitting: Hematology & Oncology

## 2021-08-24 ENCOUNTER — Other Ambulatory Visit: Payer: Self-pay | Admitting: Oncology

## 2021-08-24 ENCOUNTER — Encounter: Payer: Self-pay | Admitting: Hematology & Oncology

## 2021-08-24 VITALS — BP 146/77 | HR 78 | Temp 78.0°F | Resp 18 | Ht 66.0 in | Wt 214.0 lb

## 2021-08-24 DIAGNOSIS — D72829 Elevated white blood cell count, unspecified: Secondary | ICD-10-CM

## 2021-08-24 DIAGNOSIS — D72823 Leukemoid reaction: Secondary | ICD-10-CM | POA: Insufficient documentation

## 2021-08-24 LAB — CBC WITH DIFFERENTIAL (CANCER CENTER ONLY)
Abs Immature Granulocytes: 0.04 10*3/uL (ref 0.00–0.07)
Basophils Absolute: 0.1 10*3/uL (ref 0.0–0.1)
Basophils Relative: 1 %
Eosinophils Absolute: 0.1 10*3/uL (ref 0.0–0.5)
Eosinophils Relative: 1 %
HCT: 45.1 % (ref 36.0–46.0)
Hemoglobin: 14.5 g/dL (ref 12.0–15.0)
Immature Granulocytes: 0 %
Lymphocytes Relative: 22 %
Lymphs Abs: 2.8 10*3/uL (ref 0.7–4.0)
MCH: 29.3 pg (ref 26.0–34.0)
MCHC: 32.2 g/dL (ref 30.0–36.0)
MCV: 91.1 fL (ref 80.0–100.0)
Monocytes Absolute: 0.8 10*3/uL (ref 0.1–1.0)
Monocytes Relative: 7 %
Neutro Abs: 8.5 10*3/uL — ABNORMAL HIGH (ref 1.7–7.7)
Neutrophils Relative %: 69 %
Platelet Count: 306 10*3/uL (ref 150–400)
RBC: 4.95 MIL/uL (ref 3.87–5.11)
RDW: 13.1 % (ref 11.5–15.5)
WBC Count: 12.3 10*3/uL — ABNORMAL HIGH (ref 4.0–10.5)
nRBC: 0 % (ref 0.0–0.2)

## 2021-08-24 LAB — CMP (CANCER CENTER ONLY)
ALT: 12 U/L (ref 0–44)
AST: 11 U/L — ABNORMAL LOW (ref 15–41)
Albumin: 4 g/dL (ref 3.5–5.0)
Alkaline Phosphatase: 77 U/L (ref 38–126)
Anion gap: 7 (ref 5–15)
BUN: 18 mg/dL (ref 6–20)
CO2: 27 mmol/L (ref 22–32)
Calcium: 9.2 mg/dL (ref 8.9–10.3)
Chloride: 105 mmol/L (ref 98–111)
Creatinine: 1 mg/dL (ref 0.44–1.00)
GFR, Estimated: >60 mL/min (ref >60)
Glucose, Bld: 110 mg/dL — ABNORMAL HIGH (ref 70–99)
Potassium: 4 mmol/L (ref 3.5–5.1)
Sodium: 139 mmol/L (ref 135–145)
Total Bilirubin: 0.4 mg/dL (ref 0.3–1.2)
Total Protein: 6.7 g/dL (ref 6.5–8.1)

## 2021-08-24 LAB — SAVE SMEAR(SSMR), FOR PROVIDER SLIDE REVIEW

## 2021-08-24 LAB — LACTATE DEHYDROGENASE: LDH: 141 U/L (ref 98–192)

## 2021-08-24 NOTE — Progress Notes (Signed)
Hematology and Oncology Follow Up Visit  Sarah Nicholson [id number] 06-08-1970 51 y.o. 08/24/2021   Principle Diagnosis:  Leukocytosis-leukemoid reaction  Current Therapy:   Observation     Interim History:  [name] is back for follow-up.  This is her second office visit.  She is incredibly fascinating to talk to.  She is originally from [place].  I had a wonderful time talking to her about [place].  She works at [employer].  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  She is still smoking.  She is trying to cut back.  She is feeling well.  She has had no problems with infections.  She has had no rashes.  She did have a problem with shingles.  This appeared to be on the right T7 dermatome.  She is on gabapentin for this right now.  She has had no problems with bowels or bladder.  She is in need of a mammogram.  We will have to see about ordering one for her.  She has had no swollen lymph nodes.  There is been no change in bowel or bladder habits.  She has had no weight loss or weight gain.  Overall, I would say performance status is probably ECOG is 0.  Medications:  Current Outpatient Medications:    amphetamine-dextroamphetamine (ADDERALL) 10 MG tablet, Take 1 tablet (10 mg total) by mouth daily with breakfast., Disp: 30 tablet, Rfl: 0   cetirizine (ZYRTEC) 10 MG tablet, Take 20 mg by mouth daily., Disp: , Rfl:    clonazePAM (KLONOPIN) 0.5 MG tablet, TAKE 1 TABLET(0.5 MG) BY MOUTH THREE TIMES DAILY AS NEEDED FOR ANXIETY, Disp: 30 tablet, Rfl: 3   cloNIDine (CATAPRES) 0.1 MG tablet, Take 1 tablet (0.1 mg total) by mouth 3 (three) times daily as needed (systolic blood pressure >606)., Disp: 90 tablet, Rfl: 0   enalapril (VASOTEC) 10 MG tablet, Take 1 tablet (10 mg total) by mouth 2 (two) times daily., Disp: 60 tablet, Rfl: 1   escitalopram (LEXAPRO) 20 MG tablet, Take 2 tablets (40 mg total) by mouth daily., Disp: 180 tablet, Rfl: 3   gabapentin (NEURONTIN)  300 MG capsule, Take 1 capsule (300 mg total) by mouth 3 (three) times daily., Disp: 270 capsule, Rfl: 3   hydrochlorothiazide (HYDRODIURIL) 12.5 MG tablet, TAKE 1 TABLET(12.5 MG) BY MOUTH DAILY, Disp: 90 tablet, Rfl: 1   omeprazole (PRILOSEC) 40 MG capsule, Take 1 capsule (40 mg total) by mouth in the morning and at bedtime., Disp: 60 capsule, Rfl: 3   varenicline (CHANTIX) 1 MG tablet, Take 1 tablet (1 mg total) by mouth 2 (two) times daily., Disp: 60 tablet, Rfl: 3   varenicline (CHANTIX) 1 MG tablet, Take 1 tablet by mouth 2 (two) times daily., Disp: , Rfl:    EPINEPHrine 0.3 mg/0.3 mL IJ SOAJ injection, INJECT INTRAMUSCULARLY AS DIRECTED (Patient not taking: Reported on 08/24/2021), Disp: 2 each, Rfl: 2   ondansetron (ZOFRAN-ODT) 8 MG disintegrating tablet, Take 1 tablet (8 mg total) by mouth every 8 (eight) hours as needed for nausea or vomiting. (Patient not taking: Reported on 08/24/2021), Disp: 20 tablet, Rfl: 0   valACYclovir (VALTREX) 1000 MG tablet, Take 1 tablet (1,000 mg total) by mouth 3 (three) times daily., Disp: 21 tablet, Rfl: 0  Allergies:  Allergies  Allergen Reactions   Macrobid [Nitrofurantoin Macrocrystal] Hives   Macrobid [Nitrofurantoin] Hives and Rash   Other Hives    Preservative used in vaccinations   Tetanus Toxoids  Swelling    Pt states she was told the Tdap injection would have to be administered in the hospital  After discussion on 11/10 pt remembers being told it was thimersol in the tetanus 30+years ago that caused the swelling.     Thimerosal Rash    Past Medical History, Surgical history, Social history, and Family History were reviewed and updated.  Review of Systems: Review of Systems  Constitutional: Negative.   HENT:  Negative.    Eyes: Negative.   Respiratory: Negative.    Cardiovascular: Negative.   Gastrointestinal: Negative.   Endocrine: Negative.   Genitourinary: Negative.    Musculoskeletal: Negative.   Skin: Negative.   Neurological:  Negative.   Hematological: Negative.   Psychiatric/Behavioral: Negative.      Physical Exam:  height is '5\' 6"'$  (1.676 m) and weight is 214 lb (97.1 kg). Her oral temperature is 78 F (25.6 C) (abnormal). Her blood pressure is 146/77 (abnormal) and her pulse is 78. Her respiration is 18 and oxygen saturation is 98%.   Wt Readings from Last 3 Encounters:  08/24/21 214 lb (97.1 kg)  08/02/21 206 lb (93.4 kg)  05/17/21 216 lb (98 kg)    Physical Exam Vitals reviewed.  HENT:     Head: Normocephalic and atraumatic.  Eyes:     Pupils: Pupils are equal, round, and reactive to light.  Cardiovascular:     Rate and Rhythm: Normal rate and regular rhythm.     Heart sounds: Normal heart sounds.  Pulmonary:     Effort: Pulmonary effort is normal.     Breath sounds: Normal breath sounds.  Abdominal:     General: Bowel sounds are normal.     Palpations: Abdomen is soft.  Musculoskeletal:        General: No tenderness or deformity. Normal range of motion.     Cervical back: Normal range of motion.  Lymphadenopathy:     Cervical: No cervical adenopathy.  Skin:    General: Skin is warm and dry.     Findings: No erythema or rash.  Neurological:     Mental Status: She is alert and oriented to person, place, and time.  Psychiatric:        Behavior: Behavior normal.        Thought Content: Thought content normal.        Judgment: Judgment normal.      Lab Results  Component Value Date   WBC 12.3 (H) 08/24/2021   HGB 14.5 08/24/2021   HCT 45.1 08/24/2021   MCV 91.1 08/24/2021   PLT 306 08/24/2021     Chemistry      Component Value Date/Time   NA 139 08/24/2021 0855   K 4.0 08/24/2021 0855   CL 105 08/24/2021 0855   CO2 27 08/24/2021 0855   BUN 18 08/24/2021 0855   CREATININE 1.00 08/24/2021 0855   CREATININE 0.99 02/15/2021 1702   GLU 78 02/29/2012 0000      Component Value Date/Time   CALCIUM 9.2 08/24/2021 0855   ALKPHOS 77 08/24/2021 0855   AST 11 (L) 08/24/2021 0855    ALT 12 08/24/2021 0855   BILITOT 0.4 08/24/2021 0855     I was at her blood smear.  Her white blood cells appear mature.  I did not see any immature myeloid cells.  There is no blasts.  She had no hypersegmented polys.  Red cells appeared mature.  There is no rouleaux formation.  There is no nucleated red blood  cells.  Platelets are adequate number and size.    Impression and Plan: Ms. Eliya Bubar is a very charming 51 year old white female.  She has mild leukocytosis.  I suspect this is a leukemoid reaction.  Her blood smear which confirmed this.  Her white blood cells continue to improve.  I think this might be in relation to the fact that she is not smoking as much.  I do not see nothing on her exam that would suggest an underlying myeloproliferative process.  We do not need to send off any JAK2 studies.  We will plan to get her back in 6 months.  If her white blood cell count is stable, then we can let her go from the practice.  Again it is incredibly fun talking to her.   Volanda Napoleon, MD 6/13/202310:08 AM

## 2021-08-26 ENCOUNTER — Other Ambulatory Visit: Payer: Self-pay

## 2021-08-26 MED ORDER — ENALAPRIL MALEATE 10 MG PO TABS: 10.0000 mg | ORAL_TABLET | Freq: Two times a day (BID) | ORAL | 1 refills | Status: DC

## 2021-09-01 ENCOUNTER — Ambulatory Visit: Payer: 59 | Admitting: Sports Medicine

## 2021-09-23 ENCOUNTER — Other Ambulatory Visit: Payer: Self-pay | Admitting: Neurology

## 2021-09-23 DIAGNOSIS — F988 Other specified behavioral and emotional disorders with onset usually occurring in childhood and adolescence: Secondary | ICD-10-CM

## 2021-09-23 MED ORDER — AMPHETAMINE-DEXTROAMPHETAMINE 10 MG PO TABS: 10.0000 mg | ORAL_TABLET | Freq: Every day | ORAL | 0 refills | Status: DC

## 2021-09-23 NOTE — Telephone Encounter (Signed)
Patient left vm for refill on Adderall.   Last written 08/02/2021 #30 no refills Last appt 08/06/2021

## 2021-09-30 ENCOUNTER — Emergency Department
Admission: EM | Admit: 2021-09-30 | Discharge: 2021-09-30 | Disposition: A | Discharge status: Home / Self Care | Payer: 59 | Attending: Family Medicine | Admitting: Family Medicine

## 2021-09-30 DIAGNOSIS — W57XXXA Bitten or stung by nonvenomous insect and other nonvenomous arthropods, initial encounter: Secondary | ICD-10-CM

## 2021-09-30 DIAGNOSIS — S70362A Insect bite (nonvenomous), left thigh, initial encounter: Secondary | ICD-10-CM | POA: Diagnosis not present

## 2021-09-30 MED ORDER — DOXYCYCLINE HYCLATE 100 MG PO CAPS: 100.0000 mg | ORAL_CAPSULE | Freq: Two times a day (BID) | ORAL | 0 refills | Status: AC

## 2021-09-30 NOTE — ED Triage Notes (Signed)
Pt presents to Urgent Care with c/o insect (probable spider per pt) bite to L thigh yesterday. She states area is burning.

## 2021-09-30 NOTE — Discharge Instructions (Addendum)
Advised patient to take medication as directed with food to completion.  Encouraged patient increase daily water intake while taking this medication.  Advised patient if symptoms worsen and/or unresolved please follow-up with PCP or here for further evaluation.

## 2021-09-30 NOTE — ED Provider Notes (Signed)
[name]    CSN: [id number] Arrival date & time: 09/30/21  1632      History   Chief Complaint Chief Complaint  Patient presents with   Insect Bite    HPI Sarah Nicholson is a 51 y.o. female.   HPI 51 year old female presents with spider bite of left leg that occurred 2 days ago.  PMH significant for obesity, history of leukocytosis, HTN, and HLD.  Past Medical History:  Diagnosis Date   ADD (attention deficit disorder) 06/14/2012   Anxiety    Bronchitis    Complication of anesthesia 1987   states woke up during appedentomy   Depression 06/14/2012   Hepatic steatosis 11/13/2018   Hepatic steatosis with hepatomegaly seen on MRI abd on 11/2018   History of leukocytosis 06/14/2012   Reports "WBC 32" decades ago with negative hematology specialist workup.    Hyperlipidemia 06/14/2012   Hypertension    Seasonal allergies 06/14/2012    Patient Active Problem List   Diagnosis Date Noted   Herpes zoster 08/06/2021   Femoral hernia 03/04/2021   Leukocytosis 01/16/2021   Left lateral abdominal pain 01/15/2021   Lumbar spondylosis 01/15/2021   Trochanteric bursitis, left hip 12/04/2020   Hepatic steatosis 11/13/2018   Tobacco use 07/18/2018   Irritable bowel syndrome (IBS) 05/04/2017   Hallux valgus 12/09/2014   Other and unspecified ovarian cyst 06/25/2013   Left tennis elbow 06/25/2013   Microscopic hematuria 06/27/2012   Hyperlipidemia 06/14/2012   Prediabetes 06/14/2012   Seasonal allergies 06/14/2012   Depression 06/14/2012   ADD (attention deficit disorder) 06/14/2012    Past Surgical History:  Procedure Laterality Date   APPENDECTOMY     BICEPT TENODESIS Right 10/10/2019   Procedure: BICEPS TENODESIS;  Surgeon: Hiram Gash, MD;  Location: [facility];  Service: Orthopedics;  Laterality: Right;   bladder sling/mesh     INGUINAL HERNIA REPAIR     intrauterine ablation     NASAL SINUS SURGERY     TUBAL LIGATION       OB History     Gravida  5   Para  4   Term  2   Preterm  2   AB  1   Living  3      SAB      IAB      Ectopic      Multiple      Live Births               Home Medications    Prior to Admission medications   Medication Sig Start Date End Date Taking? Authorizing Provider  doxycycline (VIBRAMYCIN) 100 MG capsule Take 1 capsule (100 mg total) by mouth 2 (two) times daily for 10 days. 09/30/21 10/10/21 Yes Eliezer Lofts, FNP  amphetamine-dextroamphetamine (ADDERALL) 10 MG tablet Take 1 tablet (10 mg total) by mouth daily with breakfast. 09/23/21   Silverio Decamp, MD  cetirizine (ZYRTEC) 10 MG tablet Take 20 mg by mouth daily.    [provider]  clonazePAM (KLONOPIN) 0.5 MG tablet TAKE 1 TABLET(0.5 MG) BY MOUTH THREE TIMES DAILY AS NEEDED FOR ANXIETY 03/29/21   Silverio Decamp, MD  cloNIDine (CATAPRES) 0.1 MG tablet Take 1 tablet (0.1 mg total) by mouth 3 (three) times daily as needed (systolic blood pressure >782). 03/11/20   Emeterio Reeve, DO  enalapril (VASOTEC) 10 MG tablet Take 1 tablet (10 mg total) by mouth 2 (two) times daily. 08/26/21   Thekkekandam,  Gwen Her, MD  escitalopram (LEXAPRO) 20 MG tablet Take 2 tablets (40 mg total) by mouth daily. 07/14/21   Silverio Decamp, MD  gabapentin (NEURONTIN) 300 MG capsule Take 1 capsule (300 mg total) by mouth 3 (three) times daily. 08/20/21   Silverio Decamp, MD  hydrochlorothiazide (HYDRODIURIL) 12.5 MG tablet TAKE 1 TABLET(12.5 MG) BY MOUTH DAILY 06/28/21   Silverio Decamp, MD  omeprazole (PRILOSEC) 40 MG capsule Take 1 capsule (40 mg total) by mouth in the morning and at bedtime. 02/26/21   Silverio Decamp, MD  valACYclovir (VALTREX) 1000 MG tablet Take 1 tablet (1,000 mg total) by mouth 3 (three) times daily. 08/02/21   Raylene Everts, MD  varenicline (CHANTIX) 1 MG tablet Take 1 tablet (1 mg total) by mouth 2 (two) times daily. 01/27/21   Silverio Decamp,  MD  varenicline (CHANTIX) 1 MG tablet Take 1 tablet by mouth 2 (two) times daily. 01/27/21   [provider]    Family History Family History  Problem Relation Age of Onset   Depression Mother    Diabetes Father    Hyperlipidemia Father    Hypertension Father    Depression Sister    Breast cancer Maternal Grandmother    Depression Son     Social History Social History   Tobacco Use   Smoking status: Every Day    Packs/day: 0.50    Types: Cigarettes   Smokeless tobacco: Never   Tobacco comments:    30 years , currently on Chantix  Vaping Use   Vaping Use: Never used  Substance Use Topics   Alcohol use: Yes    Comment: 1-2 drinks every 6 months   Drug use: No     Allergies   Macrobid [nitrofurantoin macrocrystal], Macrobid [nitrofurantoin], Other, Tetanus toxoids, and Thimerosal   Review of Systems Review of Systems  Skin:  Positive for rash.  All other systems reviewed and are negative.    Physical Exam Triage Vital Signs ED Triage Vitals  Enc Vitals Group     BP      Pulse      Resp      Temp      Temp src      SpO2      Weight      Height      Head Circumference      Peak Flow      Pain Score      Pain Loc      Pain Edu?      Excl. in [place]?    No data found.  Updated Vital Signs BP 112/77 (BP Location: Right Arm)   Pulse 73   Temp 99.1 F (37.3 C) (Oral)   Resp 20   Ht '5\' 6"'$  (1.676 m)   Wt 210 lb (95.3 kg)   LMP  (LMP Unknown)   SpO2 95%   BMI 33.89 kg/m    Physical Exam Vitals and nursing note reviewed.  Constitutional:      Appearance: Normal appearance. She is obese.  HENT:     Head: Normocephalic and atraumatic.     Mouth/Throat:     Mouth: Mucous membranes are moist.     Pharynx: Oropharynx is clear.  Eyes:     Extraocular Movements: Extraocular movements intact.     Conjunctiva/sclera: Conjunctivae normal.     Pupils: Pupils are equal, round, and reactive to light.  Cardiovascular:     Rate and Rhythm: Normal  rate  and regular rhythm.     Pulses: Normal pulses.     Heart sounds: Normal heart sounds.  Pulmonary:     Effort: Pulmonary effort is normal.     Breath sounds: Normal breath sounds. No wheezing, rhonchi or rales.  Musculoskeletal:     Cervical back: Normal range of motion and neck supple.  Skin:    General: Skin is warm and dry.     Comments: Left upper leg (mid anterior aspect) tiny(< 3 mm) papule noted-please see image inserted below  Neurological:     General: No focal deficit present.     Mental Status: She is alert and oriented to person, place, and time. Mental status is at baseline.       UC Treatments / Results  Labs (all labs ordered are listed, but only abnormal results are displayed) Labs Reviewed - No data to display  EKG   Radiology No results found.  Procedures Procedures (including critical care time)  Medications Ordered in UC Medications - No data to display  Initial Impression / Assessment and Plan / UC Course  I have reviewed the triage vital signs and the nursing notes.  Pertinent labs & imaging results that were available during my care of the patient were reviewed by me and considered in my medical decision making (see chart for details).     MDM: 1.  Bug bite with infection, initial encounter-Rx'd Doxycycline. Advised patient to take medication as directed with food to completion.  Encouraged patient increase daily water intake while taking this medication.  Advised patient if symptoms worsen and/or unresolved please follow-up with PCP or here for further evaluation.  Patient discharged home, hemodynamically stable.  Final Clinical Impressions(s) / UC Diagnoses   Final diagnoses:  Bug bite with infection, initial encounter     Discharge Instructions      Advised patient to take medication as directed with food to completion.  Encouraged patient increase daily water intake while taking this medication.  Advised patient if symptoms worsen  and/or unresolved please follow-up with PCP or here for further evaluation.     ED Prescriptions     Medication Sig Dispense Auth. Provider   doxycycline (VIBRAMYCIN) 100 MG capsule Take 1 capsule (100 mg total) by mouth 2 (two) times daily for 10 days. 20 capsule Eliezer Lofts, FNP      PDMP not reviewed this encounter.   Eliezer Lofts, [place] 09/30/21 1729

## 2021-10-29 ENCOUNTER — Emergency Department (HOSPITAL_BASED_OUTPATIENT_CLINIC_OR_DEPARTMENT_OTHER): Payer: 59

## 2021-10-29 ENCOUNTER — Encounter (HOSPITAL_BASED_OUTPATIENT_CLINIC_OR_DEPARTMENT_OTHER): Payer: Self-pay | Admitting: Emergency Medicine

## 2021-10-29 ENCOUNTER — Other Ambulatory Visit: Payer: Self-pay

## 2021-10-29 ENCOUNTER — Emergency Department (HOSPITAL_BASED_OUTPATIENT_CLINIC_OR_DEPARTMENT_OTHER)
Admission: EM | Admit: 2021-10-29 | Discharge: 2021-10-29 | Disposition: A | Discharge status: Home / Self Care | Payer: 59 | Attending: Emergency Medicine | Admitting: Emergency Medicine

## 2021-10-29 DIAGNOSIS — R079 Chest pain, unspecified: Secondary | ICD-10-CM

## 2021-10-29 DIAGNOSIS — R42 Dizziness and giddiness: Secondary | ICD-10-CM | POA: Diagnosis present

## 2021-10-29 DIAGNOSIS — R072 Precordial pain: Secondary | ICD-10-CM | POA: Insufficient documentation

## 2021-10-29 LAB — URINALYSIS, ROUTINE W REFLEX MICROSCOPIC
Bilirubin Urine: NEGATIVE
Glucose, UA: NEGATIVE mg/dL
Ketones, ur: NEGATIVE mg/dL
Leukocytes,Ua: NEGATIVE
Nitrite: NEGATIVE
Protein, ur: NEGATIVE mg/dL
Specific Gravity, Urine: <=1.005 (ref 1.005–1.030)
pH: 5.5 (ref 5.0–8.0)

## 2021-10-29 LAB — BASIC METABOLIC PANEL
Anion gap: 7 (ref 5–15)
BUN: 13 mg/dL (ref 6–20)
CO2: 25 mmol/L (ref 22–32)
Calcium: 9 mg/dL (ref 8.9–10.3)
Chloride: 108 mmol/L (ref 98–111)
Creatinine, Ser: 1.04 mg/dL — ABNORMAL HIGH (ref 0.44–1.00)
GFR, Estimated: >60 mL/min (ref >60)
Glucose, Bld: 91 mg/dL (ref 70–99)
Potassium: 3.9 mmol/L (ref 3.5–5.1)
Sodium: 140 mmol/L (ref 135–145)

## 2021-10-29 LAB — URINALYSIS, MICROSCOPIC (REFLEX)

## 2021-10-29 LAB — CBC
HCT: 44 % (ref 36.0–46.0)
Hemoglobin: 14.6 g/dL (ref 12.0–15.0)
MCH: 29.5 pg (ref 26.0–34.0)
MCHC: 33.2 g/dL (ref 30.0–36.0)
MCV: 88.9 fL (ref 80.0–100.0)
Platelets: 312 10*3/uL (ref 150–400)
RBC: 4.95 MIL/uL (ref 3.87–5.11)
RDW: 13.2 % (ref 11.5–15.5)
WBC: 9.5 10*3/uL (ref 4.0–10.5)
nRBC: 0 % (ref 0.0–0.2)

## 2021-10-29 LAB — TROPONIN I (HIGH SENSITIVITY)
Troponin I (High Sensitivity): 3 ng/L (ref <18)
Troponin I (High Sensitivity): 3 ng/L (ref <18)

## 2021-10-29 MED ORDER — MECLIZINE HCL 25 MG PO TABS
25.0000 mg | ORAL_TABLET | Freq: Once | ORAL | Status: AC
  Administered 2021-10-29: 25 mg via ORAL
  Filled 2021-10-29: qty 1

## 2021-10-29 MED ORDER — DIPHENHYDRAMINE HCL 50 MG/ML IJ SOLN
25.0000 mg | Freq: Once | INTRAMUSCULAR | Status: AC
  Administered 2021-10-29: 25 mg via INTRAVENOUS
  Filled 2021-10-29: qty 1

## 2021-10-29 MED ORDER — PROCHLORPERAZINE EDISYLATE 10 MG/2ML IJ SOLN
10.0000 mg | Freq: Once | INTRAMUSCULAR | Status: AC
  Administered 2021-10-29: 10 mg via INTRAVENOUS
  Filled 2021-10-29: qty 2

## 2021-10-29 MED ORDER — MECLIZINE HCL 25 MG PO TABS: 25.0000 mg | ORAL_TABLET | Freq: Three times a day (TID) | ORAL | 0 refills | Status: DC | PRN

## 2021-10-29 MED ORDER — SODIUM CHLORIDE 0.9 % IV BOLUS
1000.0000 mL | Freq: Once | INTRAVENOUS | Status: AC
  Administered 2021-10-29: 1000 mL via INTRAVENOUS

## 2021-10-29 NOTE — ED Triage Notes (Signed)
Patient presents to ED with c/o dizziness, near syncope and chest pain. Patient reports symptoms have been intermittent x1 week. Denies pain at this time.

## 2021-10-29 NOTE — ED Provider Notes (Signed)
[facility] EMERGENCY DEPARTMENT Provider Note   CSN: [id number] Arrival date & time: 10/29/21  1237     History  Chief Complaint  Patient presents with   Dizziness    Sarah Nicholson is a 51 y.o. female.  51 yo F with a chief complaint of dizziness.  This is occurred a couple times over the past week or so.  Each time it happened while she was at work, she felt like she was a little bit drunk and it lasted for about 3 to 4 hours and then resolved.  Nothing seems to bring these on.  Seen to respond spontaneously.  Nothing seem to make them better.  She was at work again today and had a similar episode and then when she started feeling dizzy she developed some substernal chest pain that was pinpoint.  She thought maybe she was having a panic attack and took clonazepam.  Her symptoms resolved over the period of about an hour or so.  She still feels a little bit dizzy.  She denies any exertional symptoms denies shortness of breath denied diaphoresis.  She denies any injury to the head of the neck.  Denies cough congestion or fever.  Her husband is here and tells me that she has newly been dragging her left foot.  He noticed this just on the way here to the hospital.   Dizziness      Home Medications Prior to Admission medications   Medication Sig Start Date End Date Taking? Authorizing Provider  meclizine (ANTIVERT) 25 MG tablet Take 1 tablet (25 mg total) by mouth 3 (three) times daily as needed for dizziness. 10/29/21  Yes Deno Etienne, DO  amphetamine-dextroamphetamine (ADDERALL) 10 MG tablet Take 1 tablet (10 mg total) by mouth daily with breakfast. 09/23/21   Silverio Decamp, MD  cetirizine (ZYRTEC) 10 MG tablet Take 20 mg by mouth daily.    [provider]  clonazePAM (KLONOPIN) 0.5 MG tablet TAKE 1 TABLET(0.5 MG) BY MOUTH THREE TIMES DAILY AS NEEDED FOR ANXIETY 03/29/21   Silverio Decamp, MD  cloNIDine (CATAPRES) 0.1 MG tablet Take 1 tablet (0.1  mg total) by mouth 3 (three) times daily as needed (systolic blood pressure >433). 03/11/20   Emeterio Reeve, DO  enalapril (VASOTEC) 10 MG tablet Take 1 tablet (10 mg total) by mouth 2 (two) times daily. 08/26/21   Silverio Decamp, MD  escitalopram (LEXAPRO) 20 MG tablet Take 2 tablets (40 mg total) by mouth daily. 07/14/21   Silverio Decamp, MD  gabapentin (NEURONTIN) 300 MG capsule Take 1 capsule (300 mg total) by mouth 3 (three) times daily. 08/20/21   Silverio Decamp, MD  hydrochlorothiazide (HYDRODIURIL) 12.5 MG tablet TAKE 1 TABLET(12.5 MG) BY MOUTH DAILY 06/28/21   Silverio Decamp, MD  omeprazole (PRILOSEC) 40 MG capsule Take 1 capsule (40 mg total) by mouth in the morning and at bedtime. 02/26/21   Silverio Decamp, MD  valACYclovir (VALTREX) 1000 MG tablet Take 1 tablet (1,000 mg total) by mouth 3 (three) times daily. 08/02/21   Raylene Everts, MD  varenicline (CHANTIX) 1 MG tablet Take 1 tablet (1 mg total) by mouth 2 (two) times daily. 01/27/21   Silverio Decamp, MD  varenicline (CHANTIX) 1 MG tablet Take 1 tablet by mouth 2 (two) times daily. 01/27/21   [provider]      Allergies    Macrobid [nitrofurantoin macrocrystal], Macrobid [nitrofurantoin], Other, Tetanus toxoids, and Thimerosal  Review of Systems   Review of Systems  Neurological:  Positive for dizziness.    Physical Exam Updated Vital Signs BP 122/65 (BP Location: Right Arm)   Pulse (!) 53   Temp 97.8 F (36.6 C) (Oral)   Resp 16   LMP  (LMP Unknown)   SpO2 93%  Physical Exam Vitals and nursing note reviewed.  Constitutional:      General: She is not in acute distress.    Appearance: She is well-developed. She is not diaphoretic.  HENT:     Head: Normocephalic and atraumatic.  Eyes:     Pupils: Pupils are equal, round, and reactive to light.  Cardiovascular:     Rate and Rhythm: Normal rate and regular rhythm.     Heart sounds: No murmur heard.     No friction rub. No gallop.  Pulmonary:     Effort: Pulmonary effort is normal.     Breath sounds: No wheezing or rales.  Abdominal:     General: There is no distension.     Palpations: Abdomen is soft.     Tenderness: There is no abdominal tenderness.  Musculoskeletal:        General: No tenderness.     Cervical back: Normal range of motion and neck supple.     Comments: Patient does have some very subtle foot drop on the left.  She has past-pointing with both upper extremities the worse on the left than the right.  Skin:    General: Skin is warm and dry.  Neurological:     Mental Status: She is alert and oriented to person, place, and time.  Psychiatric:        Behavior: Behavior normal.     ED Results / Procedures / Treatments   Labs (all labs ordered are listed, but only abnormal results are displayed) Labs Reviewed  BASIC METABOLIC PANEL - Abnormal; Notable for the following components:      Result Value   Creatinine, Ser 1.04 (*)    All other components within normal limits  URINALYSIS, ROUTINE W REFLEX MICROSCOPIC - Abnormal; Notable for the following components:   Hgb urine dipstick MODERATE (*)    All other components within normal limits  URINALYSIS, MICROSCOPIC (REFLEX) - Abnormal; Notable for the following components:   Bacteria, UA RARE (*)    All other components within normal limits  CBC  TROPONIN I (HIGH SENSITIVITY)  TROPONIN I (HIGH SENSITIVITY)    EKG EKG Interpretation  Date/Time:  Friday October 29 2021 12:45:36 EDT Ventricular Rate:  62 PR Interval:  148 QRS Duration: 86 QT Interval:  414 QTC Calculation: 420 R Axis:   44 Text Interpretation: Normal sinus rhythm T wave abnormality, consider anterior ischemia Abnormal ECG no wpw, prolonged qt or brugada No significant change since last tracing Confirmed by Deno Etienne [phone]) on 10/29/2021 2:58:10 PM  Radiology DG Chest 1 View  Result Date: 10/29/2021 CLINICAL DATA:  Syncope, chest pain. EXAM:  CHEST  1 VIEW COMPARISON:  February 23, 2021. FINDINGS: Stable cardiomediastinal silhouette. Both lungs are clear. The visualized skeletal structures are unremarkable. IMPRESSION: No active disease. Electronically Signed   By: Marijo Conception M.D.   On: 10/29/2021 14:57    Procedures Procedures    Medications Ordered in ED Medications  prochlorperazine (COMPAZINE) injection 10 mg (10 mg Intravenous Given 10/29/21 1539)  diphenhydrAMINE (BENADRYL) injection 25 mg (25 mg Intravenous Given 10/29/21 1542)  sodium chloride 0.9 % bolus 1,000 mL (0 mLs Intravenous Stopped  10/29/21 1637)  meclizine (ANTIVERT) tablet 25 mg (25 mg Oral Given 10/29/21 1548)    ED Course/ Medical Decision Making/ A&P                           Medical Decision Making Amount and/or Complexity of Data Reviewed Labs: ordered.  Risk Prescription drug management.   51 yo F with a cc of dizziness.  This has been going on for about 2 weeks.  The patient is now had 3 episodes of this.  Seem to occur spontaneously and then resolve after period of about 3 or 4 hours.  She has trouble describing it feels more like she is drunk than vertiginous.  Her neuro exam is somewhat concerning for new left-sided foot drop and she has some past-pointing with the left upper extremity.  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  2 troponins are negative.  No significant anemia no significant electrolyte abnormalities.  Chest x-ray independently interpreted by me without focal pneumothorax.  Risk-benefit discussion with the patient about MRI to assess for new foot drop and episodic dizziness.  Patient is currently declining.  We will have her follow-up with neurology as an outpatient.  7:36 PM:  I have discussed the diagnosis/risks/treatment options with the patient and family.  Evaluation and diagnostic testing in the emergency  department does not suggest an emergent condition requiring admission or immediate intervention beyond what has been performed at this time.  They will follow up with  PCP, neuro. We also discussed returning to the ED immediately if new or worsening sx occur. We discussed the sx which are most concerning (e.g., sudden worsening pain, fever, inability to tolerate by mouth) that necessitate immediate return. Medications administered to the patient during their visit and any new prescriptions provided to the patient are listed below.  Medications given during this visit Medications  prochlorperazine (COMPAZINE) injection 10 mg (10 mg Intravenous Given 10/29/21 1539)  diphenhydrAMINE (BENADRYL) injection 25 mg (25 mg Intravenous Given 10/29/21 1542)  sodium chloride 0.9 % bolus 1,000 mL (0 mLs Intravenous Stopped 10/29/21 1637)  meclizine (ANTIVERT) tablet 25 mg (25 mg Oral Given 10/29/21 1548)     The patient appears reasonably screen and/or stabilized for discharge and I doubt any other medical condition or other [facility] requiring further screening, evaluation, or treatment in the ED at this time prior to discharge.          Final Clinical Impression(s) / ED Diagnoses Final diagnoses:  Dizziness  Nonspecific chest pain    Rx / DC Orders ED Discharge Orders          Ordered    Ambulatory referral to Neurology       Comments: Dizziness, new foot drop   10/29/21 1728    meclizine (ANTIVERT) 25 MG tablet  3 times daily PRN        10/29/21 1729              Deno Etienne, DO 10/29/21 1936

## 2021-10-29 NOTE — Discharge Instructions (Signed)
Please follow-up with your neurologist and family doctor in the office.  Your neurologic exam was not completely normal for me.  Typically I would like to have you obtain an MRI.  Please discuss this with your family doctor or neurologist.  You had 2 troponins here that were negative.  This makes it unlikely that you are having a heart attack.  Please follow-up with your family doctor.

## 2021-11-01 ENCOUNTER — Telehealth: Payer: Self-pay | Admitting: General Practice

## 2021-11-01 NOTE — Telephone Encounter (Signed)
Transition Care Management Unsuccessful Follow-up Telephone Call  Date of discharge and from where:  10/29/21 from [facility]  Attempts:  1st Attempt  Reason for unsuccessful TCM follow-up call:  Left voice message

## 2021-11-03 ENCOUNTER — Ambulatory Visit (INDEPENDENT_AMBULATORY_CARE_PROVIDER_SITE_OTHER): Payer: 59

## 2021-11-03 ENCOUNTER — Other Ambulatory Visit: Payer: Self-pay | Admitting: Sports Medicine

## 2021-11-03 ENCOUNTER — Ambulatory Visit (INDEPENDENT_AMBULATORY_CARE_PROVIDER_SITE_OTHER): Payer: 59 | Admitting: Sports Medicine

## 2021-11-03 ENCOUNTER — Encounter: Payer: Self-pay | Admitting: Sports Medicine

## 2021-11-03 DIAGNOSIS — G8194 Hemiplegia, unspecified affecting left nondominant side: Secondary | ICD-10-CM

## 2021-11-03 DIAGNOSIS — R42 Dizziness and giddiness: Secondary | ICD-10-CM

## 2021-11-03 DIAGNOSIS — R079 Chest pain, unspecified: Secondary | ICD-10-CM

## 2021-11-03 DIAGNOSIS — E782 Mixed hyperlipidemia: Secondary | ICD-10-CM | POA: Diagnosis not present

## 2021-11-03 NOTE — Progress Notes (Addendum)
    Procedures performed today:    None.  Independent interpretation of notes and tests performed by another provider:   None.  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 This passed very quickly. She was seen in the ED, was noted to have mild foot drop on the left, the rest of the work-up was unrevealing. Unclear etiology, I do think we need a Zio patch for rhythm monitoring, I think we should also do a treadmill stress test. She did have a transthoracic echo in 2020 that was normal and in 2016 she had a nuclear stress test that was normal. In addition because of her foot drop I think we should do a brain MRI. We will also get some labs. She is concerned about menopause and would like me to run those labs as well.  Hemiparesis, left (HCC) Weakness, left-sided with persistent left foot dorsiflexion weakness combined with dizziness and headache.  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  Hyperlipidemia Quite elevated, adding rosuvastatin with a 9-monthrecheck.  I spent 30 minutes of total time managing this patient today, this includes chart review, face to face, and non-face to face time.  ____________________________________________ TGwen Her TDianah Field M.D., ABFM., CAQSM., AME. Primary Care and Sports Medicine [facility] [place] Adjunct Professor of [employer]  FRisk manager

## 2021-11-03 NOTE — Assessment & Plan Note (Addendum)
Weakness, left-sided with persistent left foot dorsiflexion weakness combined with dizziness and headache.  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

## 2021-11-03 NOTE — Telephone Encounter (Signed)
Transition Care Management Unsuccessful Follow-up Telephone Call  Date of discharge and from where:  10/29/21 from [facility]  Attempts:  2nd Attempt  Reason for unsuccessful TCM follow-up call:  Left voice message

## 2021-11-03 NOTE — Assessment & Plan Note (Signed)
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 This passed very quickly. She was seen in the ED, was noted to have mild foot drop on the left, the rest of the work-up was unrevealing. Unclear etiology, I do think we need a Zio patch for rhythm monitoring, I think we should also do a treadmill stress test. She did have a transthoracic echo in 2020 that was normal and in 2016 she had a nuclear stress test that was normal. In addition because of her foot drop I think we should do a brain MRI. We will also get some labs. She is concerned about menopause and would like me to run those labs as well.

## 2021-11-03 NOTE — Progress Notes (Unsigned)
Enrolled for [employer] to mail a ZIO XT long term holter monitor to the patients address on file.   DOD to read. 

## 2021-11-05 MED ORDER — ROSUVASTATIN CALCIUM 20 MG PO TABS: 20.0000 mg | ORAL_TABLET | Freq: Every day | ORAL | 3 refills | Status: DC

## 2021-11-05 NOTE — Addendum Note (Signed)
Addended by: Silverio Decamp on: 11/05/2021 10:37 AM   Modules accepted: Orders

## 2021-11-05 NOTE — Assessment & Plan Note (Signed)
Quite elevated, adding rosuvastatin with a 43-monthrecheck.

## 2021-11-05 NOTE — Telephone Encounter (Signed)
Transition Care Management Unsuccessful Follow-up Telephone Call  Date of discharge and from where:  10/29/21 from [facility]  Attempts:  3rd Attempt  Reason for unsuccessful TCM follow-up call:  Left voice message

## 2021-11-06 ENCOUNTER — Encounter: Payer: Self-pay | Admitting: Sports Medicine

## 2021-11-08 DIAGNOSIS — R42 Dizziness and giddiness: Secondary | ICD-10-CM | POA: Diagnosis not present

## 2021-11-08 DIAGNOSIS — R079 Chest pain, unspecified: Secondary | ICD-10-CM | POA: Diagnosis not present

## 2021-11-08 LAB — CBC
HCT: 42.4 % (ref 35.0–45.0)
Hemoglobin: 14.4 g/dL (ref 11.7–15.5)
MCH: 29.8 pg (ref 27.0–33.0)
MCHC: 34 g/dL (ref 32.0–36.0)
MCV: 87.8 fL (ref 80.0–100.0)
MPV: 9.6 fL (ref 7.5–12.5)
Platelets: 320 10*3/uL (ref 140–400)
RBC: 4.83 10*6/uL (ref 3.80–5.10)
RDW: 13.2 % (ref 11.0–15.0)
WBC: 10 10*3/uL (ref 3.8–10.8)

## 2021-11-08 LAB — BRAIN NATRIURETIC PEPTIDE: Brain Natriuretic Peptide: 6 pg/mL (ref <100)

## 2021-11-08 LAB — LIPID PANEL
Cholesterol: 223 mg/dL — ABNORMAL HIGH (ref <200)
HDL: 36 mg/dL — ABNORMAL LOW (ref >=50)
LDL Cholesterol (Calc): 136 mg/dL (calc) — ABNORMAL HIGH
Non-HDL Cholesterol (Calc): 187 mg/dL (calc) — ABNORMAL HIGH (ref <130)
Total CHOL/HDL Ratio: 6.2 (calc) — ABNORMAL HIGH (ref <5.0)
Triglycerides: 360 mg/dL — ABNORMAL HIGH (ref <150)

## 2021-11-08 LAB — LUTEINIZING HORMONE: LH: 68.2 m[IU]/mL

## 2021-11-08 LAB — TSH: TSH: 1.69 mIU/L

## 2021-11-08 LAB — COMPLETE METABOLIC PANEL WITH GFR
AG Ratio: 1.6 (calc) (ref 1.0–2.5)
ALT: 12 U/L (ref 6–29)
AST: 12 U/L (ref 10–35)
Albumin: 3.8 g/dL (ref 3.6–5.1)
Alkaline phosphatase (APISO): 74 U/L (ref 37–153)
BUN/Creatinine Ratio: 16 (calc) (ref 6–22)
BUN: 17 mg/dL (ref 7–25)
CO2: 26 mmol/L (ref 20–32)
Calcium: 9.2 mg/dL (ref 8.6–10.4)
Chloride: 107 mmol/L (ref 98–110)
Creat: 1.09 mg/dL — ABNORMAL HIGH (ref 0.50–1.03)
Globulin: 2.4 g/dL (calc) (ref 1.9–3.7)
Glucose, Bld: 120 mg/dL (ref 65–139)
Potassium: 4.1 mmol/L (ref 3.5–5.3)
Sodium: 141 mmol/L (ref 135–146)
Total Bilirubin: 0.2 mg/dL (ref 0.2–1.2)
Total Protein: 6.2 g/dL (ref 6.1–8.1)
eGFR: 61 mL/min/{1.73_m2} (ref >=60)

## 2021-11-08 LAB — VITAMIN B12: Vitamin B-12: 413 pg/mL (ref 200–1100)

## 2021-11-08 LAB — PROGESTERONE: Progesterone: <0.5 ng/mL

## 2021-11-08 LAB — ESTROGENS, TOTAL: Estrogen: 61 pg/mL

## 2021-11-08 LAB — FOLLICLE STIMULATING HORMONE: FSH: 81.7 m[IU]/mL

## 2021-11-10 NOTE — Telephone Encounter (Signed)
Patient has been scheduled for the MRI on 11/23/21 and the Stress test on 11/30/21. Patient has been notified via [place].

## 2021-11-12 ENCOUNTER — Other Ambulatory Visit: Payer: Self-pay

## 2021-11-12 MED ORDER — ENALAPRIL MALEATE 10 MG PO TABS: 10.0000 mg | ORAL_TABLET | Freq: Two times a day (BID) | ORAL | 1 refills | Status: DC

## 2021-11-17 ENCOUNTER — Ambulatory Visit: Payer: 59 | Admitting: Diagnostic Neuroimaging

## 2021-11-18 ENCOUNTER — Other Ambulatory Visit: Payer: Self-pay

## 2021-11-23 ENCOUNTER — Ambulatory Visit (INDEPENDENT_AMBULATORY_CARE_PROVIDER_SITE_OTHER): Payer: 59

## 2021-11-23 ENCOUNTER — Other Ambulatory Visit: Payer: 59

## 2021-11-23 DIAGNOSIS — G43909 Migraine, unspecified, not intractable, without status migrainosus: Secondary | ICD-10-CM | POA: Diagnosis not present

## 2021-11-23 DIAGNOSIS — R42 Dizziness and giddiness: Secondary | ICD-10-CM

## 2021-11-23 DIAGNOSIS — R079 Chest pain, unspecified: Secondary | ICD-10-CM

## 2021-11-23 DIAGNOSIS — R202 Paresthesia of skin: Secondary | ICD-10-CM | POA: Diagnosis not present

## 2021-11-23 MED ORDER — GADOBUTROL 1 MMOL/ML IV SOLN
9.5000 mL | Freq: Once | INTRAVENOUS | Status: AC | PRN
  Administered 2021-11-23: 9.5 mL via INTRAVENOUS

## 2021-11-24 ENCOUNTER — Encounter: Payer: Self-pay | Admitting: Sports Medicine

## 2021-11-24 ENCOUNTER — Ambulatory Visit (INDEPENDENT_AMBULATORY_CARE_PROVIDER_SITE_OTHER): Payer: 59 | Admitting: Sports Medicine

## 2021-11-24 VITALS — BP 111/73 | HR 68 | Ht 66.0 in | Wt 213.0 lb

## 2021-11-24 DIAGNOSIS — Z1211 Encounter for screening for malignant neoplasm of colon: Secondary | ICD-10-CM

## 2021-11-24 DIAGNOSIS — Z0000 Encounter for general adult medical examination without abnormal findings: Secondary | ICD-10-CM

## 2021-11-24 DIAGNOSIS — Z1231 Encounter for screening mammogram for malignant neoplasm of breast: Secondary | ICD-10-CM

## 2021-11-24 DIAGNOSIS — Z124 Encounter for screening for malignant neoplasm of cervix: Secondary | ICD-10-CM | POA: Diagnosis not present

## 2021-11-24 DIAGNOSIS — R079 Chest pain, unspecified: Secondary | ICD-10-CM

## 2021-11-24 NOTE — Progress Notes (Addendum)
Subjective:    CC: Annual Physical Exam  HPI:  This patient is here for their annual physical  I reviewed the past medical history, family history, social history, surgical history, and allergies today and no changes were needed.  Please see the problem list section below in epic for further details.  Past Medical History: Past Medical History:  Diagnosis Date   ADD (attention deficit disorder) 06/14/2012   Anxiety    Bronchitis    Complication of anesthesia 1987   states woke up during appedentomy   Depression 06/14/2012   Hepatic steatosis 11/13/2018   Hepatic steatosis with hepatomegaly seen on MRI abd on 11/2018   History of leukocytosis 06/14/2012   Reports "WBC 32" decades ago with negative hematology specialist workup.    Hyperlipidemia 06/14/2012   Hypertension    Seasonal allergies 06/14/2012   Past Surgical History: Past Surgical History:  Procedure Laterality Date   APPENDECTOMY     BICEPT TENODESIS Right 10/10/2019   Procedure: BICEPS TENODESIS;  Surgeon: Hiram Gash, MD;  Location: [place];  Service: Orthopedics;  Laterality: Right;   bladder sling/mesh     INGUINAL HERNIA REPAIR     intrauterine ablation     NASAL SINUS SURGERY     TUBAL LIGATION     Social History: Social History   Socioeconomic History   Marital status: Married    Spouse name: Not on file   Number of children: Not on file   Years of education: Not on file   Highest education level: Not on file  Occupational History   Not on file  Tobacco Use   Smoking status: Every Day    Packs/day: 0.50    Types: Cigarettes   Smokeless tobacco: Never   Tobacco comments:    30 years , currently on Chantix  Vaping Use   Vaping Use: Never used  Substance and Sexual Activity   Alcohol use: Yes    Comment: 1-2 drinks every 6 months   Drug use: No   Sexual activity: Not on file  Other Topics Concern   Not on file  Social History Narrative   Not on file   Social  Determinants of Health   Financial Resource Strain: Not on file  Food Insecurity: Not on file  Transportation Needs: Not on file  Physical Activity: Not on file  Stress: Not on file  Social Connections: Not on file   Family History: Family History  Problem Relation Age of Onset   Depression Mother    Diabetes Father    Hyperlipidemia Father    Hypertension Father    Depression Sister    Breast cancer Maternal Grandmother    Depression Son    Allergies: Allergies  Allergen Reactions   Macrobid [Nitrofurantoin Macrocrystal] Hives   Macrobid [Nitrofurantoin] Hives and Rash   Other Hives    Preservative used in vaccinations   Tetanus Toxoids Swelling    Pt states she was told the Tdap injection would have to be administered in the hospital  After discussion on 11/10 pt remembers being told it was thimersol in the tetanus 30+years ago that caused the swelling.     Thimerosal (Thiomersal) Rash   Medications: See med rec.  Review of Systems: No headache, visual changes, nausea, vomiting, diarrhea, constipation, dizziness, abdominal pain, skin rash, fevers, chills, night sweats, swollen lymph nodes, weight loss, chest pain, body aches, joint swelling, muscle aches, shortness of breath, mood changes, visual or auditory hallucinations.  Objective:  General: Well Developed, well nourished, and in no acute distress.  Neuro: Alert and oriented x3, extra-ocular muscles intact, sensation grossly intact. Cranial nerves II through XII are intact, motor, sensory, and coordinative functions are all intact. HEENT: Normocephalic, atraumatic, pupils equal round reactive to light, neck supple, no masses, no lymphadenopathy, thyroid nonpalpable. Oropharynx, nasopharynx, external ear canals are unremarkable. Skin: Warm and dry, no rashes noted.  Cardiac: Regular rate and rhythm, no murmurs rubs or gallops.  Respiratory: Clear to auscultation bilaterally. Not using accessory muscles, speaking in  full sentences.  Abdominal: Soft, nontender, nondistended, positive bowel sounds, no masses, no organomegaly.  Musculoskeletal: Shoulder, elbow, wrist, hip, knee, ankle stable, and with full range of motion.  Impression and Recommendations:    The patient was counselled, risk factors were discussed, anticipatory guidance given.  Annual physical exam Patient will get her flu and Shingrix at work, ordering mammogram, GYN referral for Pap smear and Cologuard.  Chest pain Update 12/01/2021: Stress test is abnormal, for this reason I would like to do an urgent referral to cardiology.  I would also like her on an aspirin daily which I will send in.   ____________________________________________ [name], M.D., ABFM., CAQSM., AME. Primary Care and Sports Medicine [facility]  Adjunct Professor of [place] [employer]  Risk manager

## 2021-11-24 NOTE — Assessment & Plan Note (Signed)
Patient will get her flu and Shingrix at work, ordering mammogram, GYN referral for Pap smear and Cologuard.

## 2021-11-26 ENCOUNTER — Telehealth (HOSPITAL_COMMUNITY): Payer: Self-pay | Admitting: *Deleted

## 2021-11-26 NOTE — Telephone Encounter (Signed)
Close encounter 

## 2021-11-30 ENCOUNTER — Ambulatory Visit (HOSPITAL_COMMUNITY)
Admission: RE | Admit: 2021-11-30 | Discharge: 2021-11-30 | Disposition: A | Discharge status: Home / Self Care | Payer: 59 | Source: Ambulatory Visit | Attending: Cardiology | Admitting: Cardiology

## 2021-11-30 DIAGNOSIS — R079 Chest pain, unspecified: Secondary | ICD-10-CM | POA: Diagnosis not present

## 2021-11-30 LAB — EXERCISE TOLERANCE TEST
Angina Index: 0
Duke Treadmill Score: -4
Estimated workload: 7.1
Exercise duration (min): 6 min
Exercise duration (sec): 4 s
MPHR: 168 {beats}/min
Peak HR: 153 {beats}/min
Percent HR: 91 %
Rest HR: 77 {beats}/min
ST Depression (mm): 2 mm

## 2021-12-01 MED ORDER — ASPIRIN 81 MG PO TBEC: 81.0000 mg | DELAYED_RELEASE_TABLET | Freq: Every day | ORAL | 3 refills | Status: DC

## 2021-12-01 NOTE — Progress Notes (Deleted)
Cardiology Office Note:    Date:  12/01/2021   ID:  Sarah Nicholson, DOB 06/29/1970, MRN [id number]  PCP:  Silverio Decamp, MD   [facility] Providers Cardiologist:  None {  Referring MD: Silverio Decamp,*    History of Present Illness:    Sarah Nicholson is a 51 y.o. female with a hx of ADD, anxiety, depression, HTN, and HLD who was referred by Dr. Dianah Field for further evaluation of chest pain.   Patient was seen on 11/03/21 by Dr. Dianah Field where she was having episodes of chest pain and dizziness. ETT 11/30/21 showed that the patient exercised 22mn 4sec achieving 7.1METs. Had baseline ST depressions but they became more significant STD with frequent PVCs concerning for ischemia. She is now referred to Cardiology for further management.  Today, ***  Past Medical History:  Diagnosis Date   ADD (attention deficit disorder) 06/14/2012   Anxiety    Bronchitis    Complication of anesthesia 1987   states woke up during appedentomy   Depression 06/14/2012   Hepatic steatosis 11/13/2018   Hepatic steatosis with hepatomegaly seen on MRI abd on 11/2018   History of leukocytosis 06/14/2012   Reports "WBC 32" decades ago with negative hematology specialist workup.    Hyperlipidemia 06/14/2012   Hypertension    Seasonal allergies 06/14/2012    Past Surgical History:  Procedure Laterality Date   APPENDECTOMY     BICEPT TENODESIS Right 10/10/2019   Procedure: BICEPS TENODESIS;  Surgeon: VHiram Gash MD;  Location[place] [facility]  Service: Orthopedics;  Laterality: Right;   bladder sling/mesh     INGUINAL HERNIA REPAIR     intrauterine ablation     NASAL SINUS SURGERY     TUBAL LIGATION      Current Medications: No outpatient medications have been marked as taking for the 12/06/21 encounter (Appointment) with PFreada Bergeron MD.     Allergies:   Macrobid [nitrofurantoin macrocrystal], Macrobid [nitrofurantoin],  Other, Tetanus toxoids, and Thimerosal (thiomersal)   Social History   Socioeconomic History   Marital status: Married    Spouse name: Not on file   Number of children: Not on file   Years of education: Not on file   Highest education level: Not on file  Occupational History   Not on file  Tobacco Use   Smoking status: Every Day    Packs/day: 0.50    Types: Cigarettes   Smokeless tobacco: Never   Tobacco comments:    30 years , currently on Chantix  Vaping Use   Vaping Use: Never used  Substance and Sexual Activity   Alcohol use: Yes    Comment: 1-2 drinks every 6 months   Drug use: No   Sexual activity: Not on file  Other Topics Concern   Not on file  Social History Narrative   Not on file   Social Determinants of Health   Financial Resource Strain: Not on file  Food Insecurity: Not on file  Transportation Needs: Not on file  Physical Activity: Not on file  Stress: Not on file  Social Connections: Not on file     Family History: The patient's ***family history includes Breast cancer in her maternal grandmother; Depression in her mother, sister, and son; Diabetes in her father; Hyperlipidemia in her father; Hypertension in her father.  ROS:   Please see the history of present illness.    *** All other systems reviewed and are negative.  EKGs/Labs/Other Studies Reviewed:    The following studies were reviewed today: ETT 23-Dec-2021:   Stress ECG interpretation confounded by resting ST-T wave abnormalities. However, there is worsening of ST segment depressions with stress, horizontal depressions, and frequent PVCs with stress. These findings suggests Stress ECG is abnormal and positive for ischemia. Consider imaging guided stress testing for further evaluation.   A Bruce protocol stress test was performed. Exercise capacity was normal. Patient exercised for 6 min and 4 sec. Maximum HR of 153 bpm. MPHR 91.0 %. Peak METS 7.1 . The patient experienced no angina during the  test. The patient requested the test to be stopped. The patient reported dyspnea and dizziness during the stress test. Patient also experienced neck pain with stress. Hypertensive blood pressure response to exercise (219/78 mmHg). Normal heart rate response noted during stress. Heart rate recovery was normal.   2.0 mm of horizontal ST depression (II, III, aVF, V4, V5 and V6) was noted. Arrhythmias during stress: frequent PVCs. Arrhythmias during recovery: frequent PVCs. ECG was interpretable and non-conclusive.   Prior study not available for comparison.  EKG:  EKG is *** ordered today.  The ekg ordered today demonstrates ***  Recent Labs: 11/03/2021: ALT 12; Brain Natriuretic Peptide 6; BUN 17; Creat 1.09; Hemoglobin 14.4; Platelets 320; Potassium 4.1; Sodium 141; TSH 1.69  Recent Lipid Panel    Component Value Date/Time   CHOL 223 (H) 11/03/2021 0000   TRIG 360 (H) 11/03/2021 0000   HDL 36 (L) 11/03/2021 0000   CHOLHDL 6.2 (H) 11/03/2021 0000   VLDL 63 (H) 11/11/2016 0951   LDLCALC 136 (H) 11/03/2021 0000     Risk Assessment/Calculations:   {Does this patient have ATRIAL FIBRILLATION?:[phone]}  No BP recorded.  {Refresh Note OR Click here to enter BP  :1}***         Physical Exam:    VS:  There were no vitals taken for this visit.    Wt Readings from Last 3 Encounters:  11/24/21 213 lb (96.6 kg)  11/03/21 214 lb (97.1 kg)  09/30/21 210 lb (95.3 kg)     GEN: *** Well nourished, well developed in no acute distress HEENT: Normal NECK: No JVD; No carotid bruits LYMPHATICS: No lymphadenopathy CARDIAC: ***RRR, no murmurs, rubs, gallops RESPIRATORY:  Clear to auscultation without rales, wheezing or rhonchi  ABDOMEN: Soft, non-tender, non-distended MUSCULOSKELETAL:  No edema; No deformity  SKIN: Warm and dry NEUROLOGIC:  Alert and oriented x 3 PSYCHIATRIC:  Normal affect   ASSESSMENT:    No diagnosis found. PLAN:    In order of problems listed above:  #Chest  Pain: #Positive Stress Test: Patient with episodes of chest pain found to have worsening ST depressions on ETT and more frequent PVCs during exercise.  -Check coronary CTA -Continue ASA '81mg'$  daily -Continue crestor '20mg'$  daily  #HTN: -Continue clonidine 0.'1mg'$  TID -Continue enalapril '10mg'$  daily -Continue HCTZ 12.'5mg'$  daily  #HLD: -Continue crestor '20mg'$  daily      {Are you ordering a CV Procedure (e.g. stress test, cath, DCCV, TEE, etc)?   Press F2        :[id number]}    Medication Adjustments/Labs and Tests Ordered: Current medicines are reviewed at length with the patient today.  Concerns regarding medicines are outlined above.  No orders of the defined types were placed in this encounter.  No orders of the defined types were placed in this encounter.   There are no Patient Instructions on file for this visit.   Signed, Greer Ee  Johney Frame, MD  12/01/2021 8:43 PM    [place]

## 2021-12-01 NOTE — Addendum Note (Signed)
Addended by: Silverio Decamp on: 12/01/2021 12:06 PM   Modules accepted: Orders

## 2021-12-01 NOTE — Assessment & Plan Note (Addendum)
Update 12/01/2021: Stress test is abnormal, for this reason I would like to do an urgent referral to cardiology.  I would also like her on an aspirin daily which I will send in.

## 2021-12-05 LAB — COLOGUARD: COLOGUARD: NEGATIVE

## 2021-12-06 ENCOUNTER — Encounter: Payer: Self-pay | Admitting: Cardiology

## 2021-12-06 ENCOUNTER — Ambulatory Visit: Payer: 59 | Attending: Cardiology | Admitting: Cardiology

## 2021-12-06 VITALS — BP 124/82 | HR 66 | Ht 66.0 in | Wt 213.4 lb

## 2021-12-06 DIAGNOSIS — R9439 Abnormal result of other cardiovascular function study: Secondary | ICD-10-CM

## 2021-12-06 DIAGNOSIS — E782 Mixed hyperlipidemia: Secondary | ICD-10-CM | POA: Diagnosis not present

## 2021-12-06 DIAGNOSIS — Z01812 Encounter for preprocedural laboratory examination: Secondary | ICD-10-CM | POA: Diagnosis not present

## 2021-12-06 DIAGNOSIS — Z720 Tobacco use: Secondary | ICD-10-CM

## 2021-12-06 DIAGNOSIS — I208 Other forms of angina pectoris: Secondary | ICD-10-CM

## 2021-12-06 DIAGNOSIS — R42 Dizziness and giddiness: Secondary | ICD-10-CM

## 2021-12-06 DIAGNOSIS — I10 Essential (primary) hypertension: Secondary | ICD-10-CM

## 2021-12-06 NOTE — Patient Instructions (Signed)
Medication Instructions:   Your physician recommends that you continue on your current medications as directed. Please refer to the Current Medication list given to you today.  *If you need a refill on your cardiac medications before your next appointment, please call your pharmacy*   Lab Work:  TODAY--BMET AND CBC W DIFF  If you have labs (blood work) drawn today and your tests are completely normal, you will receive your results only by: [facility] (if you have MyChart) OR A paper copy in the mail If you have any lab test that is abnormal or we need to change your treatment, we will call you to review the results.   Testing/Procedures:     Cardiac/Peripheral Catheterization   You are scheduled for a Cardiac Catheterization on Thursday, September 28 with Dr. Daneen Schick.  1. Please arrive at the Main Entrance A at [facility]: [place], [place] [address] on September 28 at 10:00 AM (This time is two hours before your procedure to ensure your preparation). Free valet parking service is available. You will check in at ADMITTING. The support person will be asked to wait in the waiting room.  It is OK to have someone drop you off and come back when you are ready to be discharged.        Special note: Every effort is made to have your procedure done on time. Please understand that emergencies sometimes delay scheduled procedures.   . 2. Diet: Do not eat solid foods after midnight.  You may have clear liquids until 5 AM the day of the procedure.  3. Labs: You will need to have blood drawn on Monday, September 25 at [facility]. [address]  Open: 7:30am - 5pm    Phone: [phone]. You do not need to be fasting.  4. Medication instructions in preparation for your procedure:   Contrast Allergy: No   Stop taking, HTCZ (Hydrochlorothiazide) Thursday, September 28,   On the morning of your procedure,  take Aspirin 81 mg and any morning medicines NOT listed above.  You may use sips of water.  5. Plan to go home the same day, you will only stay overnight if medically necessary. 6. You MUST have a responsible adult to drive you home. 7. An adult MUST be with you the first 24 hours after you arrive home. 8. Bring a current list of your medications, and the last time and date medication taken. 9. Bring ID and current insurance cards. 10.Please wear clothes that are easy to get on and off and wear slip-on shoes.  Thank you for allowing [place] to care for you!   -- [facility]    Follow-Up:  ONE MONTH WITH AN EXTENDER IN THE OFFICE FOR POST-CATH FOLLOW-UP    Important Information About Sugar

## 2021-12-06 NOTE — H&P (View-Only) (Signed)
Cardiology Office Note:    Date:  12/06/2021   ID:  Sarah Nicholson, DOB [birth year], MRN [id number]  PCP:  Silverio Decamp, MD   [place] Providers Cardiologist:  None {  Referring MD: Silverio Decamp,*    History of Present Illness:    Sarah Nicholson is a 50 y.o. female with a hx of ADD, anxiety, depression, HTN, and HLD who was referred by Dr. Dianah Field for further evaluation of chest pain.   Patient was seen on 11/03/21 by Dr. Dianah Field where she was having episodes of chest pain and dizziness. ETT 11/30/21 showed that the patient exercised 20mn 4sec achieving 7.1METs. Had baseline ST depressions but they became more significant STD with frequent PVCs concerning for ischemia. She is now referred to Cardiology for further management.  Today, the patient states that in the past 2 years she has been having dizziness episodes while at work. 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 While symptoms mainly occur with standing, she does note that it can happen with sitting. At the time of her dizzy episodes, her blood pressure has been as high as 204/110 and as low as 126/68 with no documented low blood pressures during her events. She has the feeling of presyncope but has not passed out. Symptoms can last anywhere from a couple of minutes to several hours. Does not improve with sitting down. States she remains hydrated and does not skip meals. Episodes do not correlate with when she takes her clonidine. During her most recent episode she also had chest pain and she called EMS. She took herself to the ED. There, ECG nonischemic. Trop negative x2. CXR without acute pathology. She was recommended for continued outpatient follow-up.  Today, the patient describes other intermittent episodes of chest discomfort/tightness. At one of the rest areas last night, she walked up stairs and became short winded with chest pain.  This chest tightness has been more frequent especially with exertion. Initially, she had felt her chest tightness was attributable to anxiety. She has tried taking clonazepam with no improvement.  Reports occasional LE edema and some SOB at night but this is not consistent.   At this time she smokes about 1/2 ppd and is trying to quit smoking.  Recently she was started on rosuvastatin. Her triglycerides were 360.  In her family, her father has dilated cardiomyopathy and atrial fibrillation.  She denies any palpitations, or PND.   Past Medical History:  Diagnosis Date   ADD (attention deficit disorder) 06/14/2012   Anxiety    Bronchitis    Complication of anesthesia 1987   states woke up during appedentomy   Depression 06/14/2012   Hepatic steatosis 11/13/2018   Hepatic steatosis with hepatomegaly seen on MRI abd on 11/2018   History of leukocytosis 06/14/2012   Reports "WBC 32" decades ago with negative hematology specialist workup.    Hyperlipidemia 06/14/2012   Hypertension    Seasonal allergies 06/14/2012    Past Surgical History:  Procedure Laterality Date   APPENDECTOMY     BICEPT TENODESIS Right 10/10/2019   Procedure: BICEPS TENODESIS;  Surgeon: VHiram Gash MD;  Location: [facility]  Service: Orthopedics;  Laterality: Right;   bladder sling/mesh     INGUINAL HERNIA REPAIR     intrauterine ablation     NASAL SINUS SURGERY     TUBAL LIGATION      Current Medications: Current Meds  Medication Sig   amphetamine-dextroamphetamine (ADDERALL) 10 MG tablet Take 1 tablet (10 mg total) by mouth daily with breakfast.   aspirin EC 81 MG tablet Take 1 tablet (81 mg total) by mouth daily.   cetirizine (ZYRTEC) 10 MG tablet Take 20 mg by mouth daily.   clonazePAM (KLONOPIN) 0.5 MG tablet TAKE 1 TABLET(0.5 MG) BY MOUTH THREE TIMES DAILY AS NEEDED FOR ANXIETY   cloNIDine (CATAPRES) 0.1 MG tablet Take 1 tablet (0.1 mg total) by mouth 3 (three) times daily as  needed (systolic blood pressure >017).   enalapril (VASOTEC) 10 MG tablet Take 1 tablet (10 mg total) by mouth 2 (two) times daily.   escitalopram (LEXAPRO) 20 MG tablet Take 2 tablets (40 mg total) by mouth daily.   gabapentin (NEURONTIN) 300 MG capsule Take 1 capsule (300 mg total) by mouth 3 (three) times daily.   hydrochlorothiazide (HYDRODIURIL) 12.5 MG tablet TAKE 1 TABLET(12.5 MG) BY MOUTH DAILY   meclizine (ANTIVERT) 25 MG tablet Take 1 tablet (25 mg total) by mouth 3 (three) times daily as needed for dizziness.   omeprazole (PRILOSEC) 40 MG capsule Take 1 capsule (40 mg total) by mouth in the morning and at bedtime.   rosuvastatin (CRESTOR) 20 MG tablet Take 1 tablet (20 mg total) by mouth daily.   varenicline (CHANTIX) 1 MG tablet Take 1 tablet (1 mg total) by mouth 2 (two) times daily.     Allergies:   Macrobid [nitrofurantoin], Other, Tetanus toxoids, and Thimerosal (thiomersal)   Social History   Socioeconomic History   Marital status: Married    Spouse name: Not on file   Number of children: Not on file   Years of education: Not on file   Highest education level: Not on file  Occupational History   Not on file  Tobacco Use   Smoking status: Every Day    Packs/day: 0.50    Types: Cigarettes   Smokeless tobacco: Never   Tobacco comments:    30 years , currently on Chantix  Vaping Use   Vaping Use: Never used  Substance and Sexual Activity   Alcohol use: Yes    Comment: 1-2 drinks every 6 months   Drug use: No   Sexual activity: Not on file  Other Topics Concern   Not on file  Social History Narrative   Not on file   Social Determinants of Health   Financial Resource Strain: Not on file  Food Insecurity: Not on file  Transportation Needs: Not on file  Physical Activity: Not on file  Stress: Not on file  Social Connections: Not on file     Family History: The patient's family history includes Breast cancer in her maternal grandmother; Depression in her  mother, sister, and son; Diabetes in her father; Hyperlipidemia in her father; Hypertension in her father.  ROS:   Review of Systems  Constitutional:  Negative for chills and fever.  HENT:  Negative for nosebleeds and tinnitus.   Eyes:  Negative for blurred vision and pain.  Respiratory:  Positive for shortness of breath. Negative for cough, hemoptysis and stridor.   Cardiovascular:  Positive for chest pain, orthopnea and leg swelling. Negative for palpitations, claudication and PND.  Gastrointestinal:  Negative for blood in stool, diarrhea, nausea and vomiting.  Genitourinary:  Negative for dysuria and hematuria.  Musculoskeletal:  Negative for falls.  Neurological:  Positive for dizziness and headaches (Ocular migraines).  Psychiatric/Behavioral:  Negative for depression, hallucinations and substance abuse. The patient does not  have insomnia.      EKGs/Labs/Other Studies Reviewed:    The following studies were reviewed today:  ETT 11/30/21:   Stress ECG interpretation confounded by resting ST-T wave abnormalities. However, there is worsening of ST segment depressions with stress, horizontal depressions, and frequent PVCs with stress. These findings suggests Stress ECG is abnormal and positive for ischemia. Consider imaging guided stress testing for further evaluation.   A Bruce protocol stress test was performed. Exercise capacity was normal. Patient exercised for 6 min and 4 sec. Maximum HR of 153 bpm. MPHR 91.0 %. Peak METS 7.1 . The patient experienced no angina during the test. The patient requested the test to be stopped. The patient reported dyspnea and dizziness during the stress test. Patient also experienced neck pain with stress. Hypertensive blood pressure response to exercise (219/78 mmHg). Normal heart rate response noted during stress. Heart rate recovery was normal.   2.0 mm of horizontal ST depression (II, III, aVF, V4, V5 and V6) was noted. Arrhythmias during stress:  frequent PVCs. Arrhythmias during recovery: frequent PVCs. ECG was interpretable and non-conclusive.   Prior study not available for comparison.  EKG:  EKG is personally reviewed. 12/06/2021:  Sinus rhythm. Nonspecific T wave abnormalities.  10/29/2021 (ED):   Normal sinus rhythm at 62 bpm. T wave abnormality, consider anterior ischemia.  Recent Labs: 11/03/2021: ALT 12; Brain Natriuretic Peptide 6; BUN 17; Creat 1.09; Hemoglobin 14.4; Platelets 320; Potassium 4.1; Sodium 141; TSH 1.69   Recent Lipid Panel    Component Value Date/Time   CHOL 223 (H) 11/03/2021 0000   TRIG 360 (H) 11/03/2021 0000   HDL 36 (L) 11/03/2021 0000   CHOLHDL 6.2 (H) 11/03/2021 0000   VLDL 63 (H) 11/11/2016 0951   LDLCALC 136 (H) 11/03/2021 0000     Risk Assessment/Calculations:                Physical Exam:    VS:  BP 124/82   Pulse 66   Ht '5\' 6"'$  (1.676 m)   Wt 213 lb 6.4 oz (96.8 kg)   SpO2 93%   BMI 34.44 kg/m     Wt Readings from Last 3 Encounters:  12/06/21 213 lb 6.4 oz (96.8 kg)  11/24/21 213 lb (96.6 kg)  11/03/21 214 lb (97.1 kg)     GEN: Well nourished, well developed in no acute distress HEENT: Normal NECK: No JVD; No carotid bruits CARDIAC: RRR, no murmurs, rubs, gallops RESPIRATORY:  Clear to auscultation without rales, wheezing or rhonchi  ABDOMEN: Soft, non-tender, non-distended MUSCULOSKELETAL:  No edema; No deformity  SKIN: Warm and dry NEUROLOGIC:  Alert and oriented x 3 PSYCHIATRIC:  Normal affect   ASSESSMENT:    1. Stable angina pectoris ([place])   2. Pre-procedure lab exam   3. Abnormal stress test   4. Mixed hyperlipidemia   5. Dizziness   6. Primary hypertension   7. Tobacco use    PLAN:    In order of problems listed above:  #Chest Pain: #Positive Stress Test: Patient with progressive episodes of exertional chest pain found to have worsening ST depressions on ETT and more frequent PVCs during exercise. Findings concerning for possible ischemia. Given  symptoms, risk factors and positive stress test, will plan for cath for further evaluation.  -Plan for cath for further evaluation -Check TTE -Continue ASA '81mg'$  daily -Continue crestor '20mg'$  daily  #Dizziness: Patient has had a 2 year history of intermittent episodes of dizziness that mainly occur with standing but can  occur with sitting as well. Symptoms can last for several minutes to several hours and nothing seems to make them better. Not associated with low blood pressure or position changes. Recent cardiac monitor reviewed by me and reveals 1 run of nonsustained VT lasting 4 beats with rare PVCs and SVE. Patient triggered events correlate with VE. Will check TTE for further evaluation and pursue ischemic work-up as above.  -Ischemic work-up as above -Check TTE -Zio personally reviewed with no significant arrhythmias, pauses or ectopy  #HTN: Blood pressure better controlled on exam today. Will continue to monitor at home -Continue enalapril '10mg'$  daily -Continue HCTZ 12.'5mg'$  daily  #HLD: TC 223, HDL 36, TG 360, LDL 136. Just started on crestor by PCP. -Continue crestor '20mg'$  daily -Repeat lipids in 6-8weeks  #Smoking: -Encouraged cessation      Shared Decision Making/Informed Consent The risks [stroke (1 in 1000), death (1 in 1000), kidney failure [usually temporary] (1 in 500), bleeding (1 in 200), allergic reaction [possibly serious] (1 in 200)], benefits (diagnostic support and management of coronary artery disease) and alternatives of a cardiac catheterization were discussed in detail with Ms. Jenne Campus and she is willing to proceed.  Follow-up:  6 months.  Medication Adjustments/Labs and Tests Ordered: Current medicines are reviewed at length with the patient today.  Concerns regarding medicines are outlined above.   Orders Placed This Encounter  Procedures   Basic metabolic panel   CBC w/Diff   EKG 12-Lead   No orders of the defined types were placed in this  encounter.  Patient Instructions  Medication Instructions:   Your physician recommends that you continue on your current medications as directed. Please refer to the Current Medication list given to you today.  *If you need a refill on your cardiac medications before your next appointment, please call your pharmacy*   Lab Work:  TODAY--BMET AND CBC W DIFF  If you have labs (blood work) drawn today and your tests are completely normal, you will receive your results only by: [facility] (if you have MyChart) OR A paper copy in the mail If you have any lab test that is abnormal or we need to change your treatment, we will call you to review the results.   Testing/Procedures:     Cardiac/Peripheral Catheterization   You are scheduled for a Cardiac Catheterization on Thursday, September 28 with Dr. Daneen Schick.  1. Please arrive at the Main Entrance A at [facility]: [place], [place] [address] on September 28 at 10:00 AM (This time is two hours before your procedure to ensure your preparation). Free valet parking service is available. You will check in at ADMITTING. The support person will be asked to wait in the waiting room.  It is OK to have someone drop you off and come back when you are ready to be discharged.        Special note: Every effort is made to have your procedure done on time. Please understand that emergencies sometimes delay scheduled procedures.   . 2. Diet: Do not eat solid foods after midnight.  You may have clear liquids until 5 AM the day of the procedure.  3. Labs: You will need to have blood drawn on Monday, September 25 at [facility] [address]  Open: 7:30am - 5pm    Phone: [phone]. You do not need to be fasting.  4. Medication instructions in preparation for your procedure:  Contrast Allergy: No   Stop taking, HTCZ (Hydrochlorothiazide) Thursday, September  28,   On the morning of your procedure, take Aspirin 81 mg and any morning medicines NOT listed above.  You may use sips of water.  5. Plan to go home the same day, you will only stay overnight if medically necessary. 6. You MUST have a responsible adult to drive you home. 7. An adult MUST be with you the first 24 hours after you arrive home. 8. Bring a current list of your medications, and the last time and date medication taken. 9. Bring ID and current insurance cards. 10.Please wear clothes that are easy to get on and off and wear slip-on shoes.  Thank you for allowing [place] to care for you!   -- [facility]    Follow-Up:  ONE MONTH WITH AN EXTENDER IN THE OFFICE FOR POST-CATH FOLLOW-UP    Important Information About Sugar         I,Mathew Stumpf,acting as a scribe for Freada Bergeron, MD.,have documented all relevant documentation on the behalf of Freada Bergeron, MD,as directed by  Freada Bergeron, MD while in the presence of Freada Bergeron, MD.  I, Freada Bergeron, MD, have reviewed all documentation for this visit. The documentation on 12/06/21 for the exam, diagnosis, procedures, and orders are all accurate and complete.   Signed, Freada Bergeron, MD  12/06/2021 12:56 PM    [place]

## 2021-12-06 NOTE — Progress Notes (Signed)
Cardiology Office Note:    Date:  12/06/2021   ID:  Sarah Nicholson, DOB Oct 26, 1970, MRN [id number]  PCP:  Silverio Decamp, MD   [place] Providers Cardiologist:  None {  Referring MD: Silverio Decamp,*    History of Present Illness:    Sarah Nicholson is a 51 y.o. female with a hx of ADD, anxiety, depression, HTN, and HLD who was referred by Dr. Dianah Field for further evaluation of chest pain.   Patient was seen on 11/03/21 by Dr. Dianah Field where she was having episodes of chest pain and dizziness. ETT 11/30/21 showed that the patient exercised 10mn 4sec achieving 7.1METs. Had baseline ST depressions but they became more significant STD with frequent PVCs concerning for ischemia. She is now referred to Cardiology for further management.  Today, the patient states that in the past 2 years she has been having dizziness episodes while at work. 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 While symptoms mainly occur with standing, she does note that it can happen with sitting. At the time of her dizzy episodes, her blood pressure has been as high as 204/110 and as low as 126/68 with no documented low blood pressures during her events. She has the feeling of presyncope but has not passed out. Symptoms can last anywhere from a couple of minutes to several hours. Does not improve with sitting down. States she remains hydrated and does not skip meals. Episodes do not correlate with when she takes her clonidine. During her most recent episode she also had chest pain and she called EMS. She took herself to the ED. There, ECG nonischemic. Trop negative x2. CXR without acute pathology. She was recommended for continued outpatient follow-up.  Today, the patient describes other intermittent episodes of chest discomfort/tightness. At one of the rest areas last night, she walked up stairs and became short winded with chest pain.  This chest tightness has been more frequent especially with exertion. Initially, she had felt her chest tightness was attributable to anxiety. She has tried taking clonazepam with no improvement.  Reports occasional LE edema and some SOB at night but this is not consistent.   At this time she smokes about 1/2 ppd and is trying to quit smoking.  Recently she was started on rosuvastatin. Her triglycerides were 360.  In her family, her father has dilated cardiomyopathy and atrial fibrillation.  She denies any palpitations, or PND.   Past Medical History:  Diagnosis Date   ADD (attention deficit disorder) 06/14/2012   Anxiety    Bronchitis    Complication of anesthesia 1987   states woke up during appedentomy   Depression 06/14/2012   Hepatic steatosis 11/13/2018   Hepatic steatosis with hepatomegaly seen on MRI abd on 11/2018   History of leukocytosis 06/14/2012   Reports "WBC 32" decades ago with negative hematology specialist workup.    Hyperlipidemia 06/14/2012   Hypertension    Seasonal allergies 06/14/2012    Past Surgical History:  Procedure Laterality Date   APPENDECTOMY     BICEPT TENODESIS Right 10/10/2019   Procedure: BICEPS TENODESIS;  Surgeon: VHiram Gash MD;  Location: [facility]  Service: Orthopedics;  Laterality: Right;   bladder sling/mesh     INGUINAL HERNIA REPAIR     intrauterine ablation     NASAL SINUS SURGERY     TUBAL LIGATION      Current Medications: Current Meds  Medication Sig   amphetamine-dextroamphetamine (ADDERALL) 10 MG tablet Take 1 tablet (10 mg total) by mouth daily with breakfast.   aspirin EC 81 MG tablet Take 1 tablet (81 mg total) by mouth daily.   cetirizine (ZYRTEC) 10 MG tablet Take 20 mg by mouth daily.   clonazePAM (KLONOPIN) 0.5 MG tablet TAKE 1 TABLET(0.5 MG) BY MOUTH THREE TIMES DAILY AS NEEDED FOR ANXIETY   cloNIDine (CATAPRES) 0.1 MG tablet Take 1 tablet (0.1 mg total) by mouth 3 (three) times daily as  needed (systolic blood pressure >932).   enalapril (VASOTEC) 10 MG tablet Take 1 tablet (10 mg total) by mouth 2 (two) times daily.   escitalopram (LEXAPRO) 20 MG tablet Take 2 tablets (40 mg total) by mouth daily.   gabapentin (NEURONTIN) 300 MG capsule Take 1 capsule (300 mg total) by mouth 3 (three) times daily.   hydrochlorothiazide (HYDRODIURIL) 12.5 MG tablet TAKE 1 TABLET(12.5 MG) BY MOUTH DAILY   meclizine (ANTIVERT) 25 MG tablet Take 1 tablet (25 mg total) by mouth 3 (three) times daily as needed for dizziness.   omeprazole (PRILOSEC) 40 MG capsule Take 1 capsule (40 mg total) by mouth in the morning and at bedtime.   rosuvastatin (CRESTOR) 20 MG tablet Take 1 tablet (20 mg total) by mouth daily.   varenicline (CHANTIX) 1 MG tablet Take 1 tablet (1 mg total) by mouth 2 (two) times daily.     Allergies:   Macrobid [nitrofurantoin], Other, Tetanus toxoids, and Thimerosal (thiomersal)   Social History   Socioeconomic History   Marital status: Married    Spouse name: Not on file   Number of children: Not on file   Years of education: Not on file   Highest education level: Not on file  Occupational History   Not on file  Tobacco Use   Smoking status: Every Day    Packs/day: 0.50    Types: Cigarettes   Smokeless tobacco: Never   Tobacco comments:    30 years , currently on Chantix  Vaping Use   Vaping Use: Never used  Substance and Sexual Activity   Alcohol use: Yes    Comment: 1-2 drinks every 6 months   Drug use: No   Sexual activity: Not on file  Other Topics Concern   Not on file  Social History Narrative   Not on file   Social Determinants of Health   Financial Resource Strain: Not on file  Food Insecurity: Not on file  Transportation Needs: Not on file  Physical Activity: Not on file  Stress: Not on file  Social Connections: Not on file     Family History: The patient's family history includes Breast cancer in her maternal grandmother; Depression in her  mother, sister, and son; Diabetes in her father; Hyperlipidemia in her father; Hypertension in her father.  ROS:   Review of Systems  Constitutional:  Negative for chills and fever.  HENT:  Negative for nosebleeds and tinnitus.   Eyes:  Negative for blurred vision and pain.  Respiratory:  Positive for shortness of breath. Negative for cough, hemoptysis and stridor.   Cardiovascular:  Positive for chest pain, orthopnea and leg swelling. Negative for palpitations, claudication and PND.  Gastrointestinal:  Negative for blood in stool, diarrhea, nausea and vomiting.  Genitourinary:  Negative for dysuria and hematuria.  Musculoskeletal:  Negative for falls.  Neurological:  Positive for dizziness and headaches (Ocular migraines).  Psychiatric/Behavioral:  Negative for depression, hallucinations and substance abuse. The patient does not  have insomnia.      EKGs/Labs/Other Studies Reviewed:    The following studies were reviewed today:  ETT 11/30/21:   Stress ECG interpretation confounded by resting ST-T wave abnormalities. However, there is worsening of ST segment depressions with stress, horizontal depressions, and frequent PVCs with stress. These findings suggests Stress ECG is abnormal and positive for ischemia. Consider imaging guided stress testing for further evaluation.   A Bruce protocol stress test was performed. Exercise capacity was normal. Patient exercised for 6 min and 4 sec. Maximum HR of 153 bpm. MPHR 91.0 %. Peak METS 7.1 . The patient experienced no angina during the test. The patient requested the test to be stopped. The patient reported dyspnea and dizziness during the stress test. Patient also experienced neck pain with stress. Hypertensive blood pressure response to exercise (219/78 mmHg). Normal heart rate response noted during stress. Heart rate recovery was normal.   2.0 mm of horizontal ST depression (II, III, aVF, V4, V5 and V6) was noted. Arrhythmias during stress:  frequent PVCs. Arrhythmias during recovery: frequent PVCs. ECG was interpretable and non-conclusive.   Prior study not available for comparison.  EKG:  EKG is personally reviewed. 12/06/2021:  Sinus rhythm. Nonspecific T wave abnormalities.  10/29/2021 (ED):   Normal sinus rhythm at 62 bpm. T wave abnormality, consider anterior ischemia.  Recent Labs: 11/03/2021: ALT 12; Brain Natriuretic Peptide 6; BUN 17; Creat 1.09; Hemoglobin 14.4; Platelets 320; Potassium 4.1; Sodium 141; TSH 1.69   Recent Lipid Panel    Component Value Date/Time   CHOL 223 (H) 11/03/2021 0000   TRIG 360 (H) 11/03/2021 0000   HDL 36 (L) 11/03/2021 0000   CHOLHDL 6.2 (H) 11/03/2021 0000   VLDL 63 (H) 11/11/2016 0951   LDLCALC 136 (H) 11/03/2021 0000     Risk Assessment/Calculations:                Physical Exam:    VS:  BP 124/82   Pulse 66   Ht '5\' 6"'$  (1.676 m)   Wt 213 lb 6.4 oz (96.8 kg)   SpO2 93%   BMI 34.44 kg/m     Wt Readings from Last 3 Encounters:  12/06/21 213 lb 6.4 oz (96.8 kg)  11/24/21 213 lb (96.6 kg)  11/03/21 214 lb (97.1 kg)     GEN: Well nourished, well developed in no acute distress HEENT: Normal NECK: No JVD; No carotid bruits CARDIAC: RRR, no murmurs, rubs, gallops RESPIRATORY:  Clear to auscultation without rales, wheezing or rhonchi  ABDOMEN: Soft, non-tender, non-distended MUSCULOSKELETAL:  No edema; No deformity  SKIN: Warm and dry NEUROLOGIC:  Alert and oriented x 3 PSYCHIATRIC:  Normal affect   ASSESSMENT:    1. Stable angina pectoris ([place])   2. Pre-procedure lab exam   3. Abnormal stress test   4. Mixed hyperlipidemia   5. Dizziness   6. Primary hypertension   7. Tobacco use    PLAN:    In order of problems listed above:  #Chest Pain: #Positive Stress Test: Patient with progressive episodes of exertional chest pain found to have worsening ST depressions on ETT and more frequent PVCs during exercise. Findings concerning for possible ischemia. Given  symptoms, risk factors and positive stress test, will plan for cath for further evaluation.  -Plan for cath for further evaluation -Check TTE -Continue ASA '81mg'$  daily -Continue crestor '20mg'$  daily  #Dizziness: Patient has had a 2 year history of intermittent episodes of dizziness that mainly occur with standing but can  occur with sitting as well. Symptoms can last for several minutes to several hours and nothing seems to make them better. Not associated with low blood pressure or position changes. Recent cardiac monitor reviewed by me and reveals 1 run of nonsustained VT lasting 4 beats with rare PVCs and SVE. Patient triggered events correlate with VE. Will check TTE for further evaluation and pursue ischemic work-up as above.  -Ischemic work-up as above -Check TTE -Zio personally reviewed with no significant arrhythmias, pauses or ectopy  #HTN: Blood pressure better controlled on exam today. Will continue to monitor at home -Continue enalapril '10mg'$  daily -Continue HCTZ 12.'5mg'$  daily  #HLD: TC 223, HDL 36, TG 360, LDL 136. Just started on crestor by PCP. -Continue crestor '20mg'$  daily -Repeat lipids in 6-8weeks  #Smoking: -Encouraged cessation      Shared Decision Making/Informed Consent The risks [stroke (1 in 1000), death (1 in 1000), kidney failure [usually temporary] (1 in 500), bleeding (1 in 200), allergic reaction [possibly serious] (1 in 200)], benefits (diagnostic support and management of coronary artery disease) and alternatives of a cardiac catheterization were discussed in detail with Sarah Nicholson and she is willing to proceed.  Follow-up:  6 months.  Medication Adjustments/Labs and Tests Ordered: Current medicines are reviewed at length with the patient today.  Concerns regarding medicines are outlined above.   Orders Placed This Encounter  Procedures   Basic metabolic panel   CBC w/Diff   EKG 12-Lead   No orders of the defined types were placed in this  encounter.  Patient Instructions  Medication Instructions:   Your physician recommends that you continue on your current medications as directed. Please refer to the Current Medication list given to you today.  *If you need a refill on your cardiac medications before your next appointment, please call your pharmacy*   Lab Work:  TODAY--BMET AND CBC W DIFF  If you have labs (blood work) drawn today and your tests are completely normal, you will receive your results only by: [place] (if you have MyChart) OR A paper copy in the mail If you have any lab test that is abnormal or we need to change your treatment, we will call you to review the results.   Testing/Procedures:     Cardiac/Peripheral Catheterization   You are scheduled for a Cardiac Catheterization on Thursday, September 28 with Dr. Daneen Schick.  1. Please arrive at the Main Entrance A at [facility]: [place], [place] [address] on September 28 at 10:00 AM (This time is two hours before your procedure to ensure your preparation). Free valet parking service is available. You will check in at ADMITTING. The support person will be asked to wait in the waiting room.  It is OK to have someone drop you off and come back when you are ready to be discharged.        Special note: Every effort is made to have your procedure done on time. Please understand that emergencies sometimes delay scheduled procedures.   . 2. Diet: Do not eat solid foods after midnight.  You may have clear liquids until 5 AM the day of the procedure.  3. Labs: You will need to have blood drawn on Monday, September 25 at [facility] at [facility]. [address]  Open: 7:30am - 5pm    Phone: [phone]. You do not need to be fasting.  4. Medication instructions in preparation for your procedure:  Contrast Allergy: No   Stop taking, HTCZ (Hydrochlorothiazide) Thursday, September  28,   On the morning of your procedure, take Aspirin 81 mg and any morning medicines NOT listed above.  You may use sips of water.  5. Plan to go home the same day, you will only stay overnight if medically necessary. 6. You MUST have a responsible adult to drive you home. 7. An adult MUST be with you the first 24 hours after you arrive home. 8. Bring a current list of your medications, and the last time and date medication taken. 9. Bring ID and current insurance cards. 10.Please wear clothes that are easy to get on and off and wear slip-on shoes.  Thank you for allowing [place] to care for you!   -- [facility]    Follow-Up:  ONE MONTH WITH AN EXTENDER IN THE OFFICE FOR POST-CATH FOLLOW-UP    Important Information About Sugar         I,Mathew Stumpf,acting as a scribe for Freada Bergeron, MD.,have documented all relevant documentation on the behalf of Freada Bergeron, MD,as directed by  Freada Bergeron, MD while in the presence of Freada Bergeron, MD.  I, Freada Bergeron, MD, have reviewed all documentation for this visit. The documentation on 12/06/21 for the exam, diagnosis, procedures, and orders are all accurate and complete.   Signed, Freada Bergeron, MD  12/06/2021 12:56 PM    [place]

## 2021-12-07 ENCOUNTER — Telehealth: Payer: Self-pay | Admitting: *Deleted

## 2021-12-07 ENCOUNTER — Telehealth: Payer: Self-pay

## 2021-12-07 LAB — BASIC METABOLIC PANEL
BUN/Creatinine Ratio: 14 (ref 9–23)
BUN: 13 mg/dL (ref 6–24)
CO2: 23 mmol/L (ref 20–29)
Calcium: 9.3 mg/dL (ref 8.7–10.2)
Chloride: 107 mmol/L — ABNORMAL HIGH (ref 96–106)
Creatinine, Ser: 0.93 mg/dL (ref 0.57–1.00)
Glucose: 110 mg/dL — ABNORMAL HIGH (ref 70–99)
Potassium: 4.3 mmol/L (ref 3.5–5.2)
Sodium: 141 mmol/L (ref 134–144)
eGFR: 74 mL/min/{1.73_m2} (ref >59)

## 2021-12-07 LAB — CBC WITH DIFFERENTIAL/PLATELET
Basophils Absolute: 0.1 10*3/uL (ref 0.0–0.2)
Basos: 1 %
EOS (ABSOLUTE): 0.1 10*3/uL (ref 0.0–0.4)
Eos: 1 %
Hematocrit: 43.9 % (ref 34.0–46.6)
Hemoglobin: 14.4 g/dL (ref 11.1–15.9)
Immature Grans (Abs): 0 10*3/uL (ref 0.0–0.1)
Immature Granulocytes: 0 %
Lymphocytes Absolute: 3 10*3/uL (ref 0.7–3.1)
Lymphs: 26 %
MCH: 28.9 pg (ref 26.6–33.0)
MCHC: 32.8 g/dL (ref 31.5–35.7)
MCV: 88 fL (ref 79–97)
Monocytes Absolute: 0.7 10*3/uL (ref 0.1–0.9)
Monocytes: 6 %
Neutrophils Absolute: 7.4 10*3/uL — ABNORMAL HIGH (ref 1.4–7.0)
Neutrophils: 66 %
Platelets: 332 10*3/uL (ref 150–450)
RBC: 4.99 x10E6/uL (ref 3.77–5.28)
RDW: 12.7 % (ref 11.7–15.4)
WBC: 11.3 10*3/uL — ABNORMAL HIGH (ref 3.4–10.8)

## 2021-12-07 NOTE — Telephone Encounter (Signed)
Left detailed message (per DPR) that patients lab is normal. Advised to call back I she has further questions.

## 2021-12-07 NOTE — Telephone Encounter (Addendum)
Cardiac Catheterization scheduled at [facility] for: Thursday December 09, 2021 12 Noon Arrival time and place: [facility] Main Entrance A at: 10 AM  Nothing to eat after midnight prior to procedure, clear liquids until 5 AM day of procedure.  Medication instructions: -Hold:  HCTZ-AM of procedure -Except hold medications usual morning medications can be taken with sips of water including aspirin 81 mg.  Confirmed patient has responsible adult to drive home post procedure and be with patient first 24 hours after arriving home.  Patient reports no new symptoms concerning for COVID-19 in the past 10 days.  Reviewed procedure instructions with patient.

## 2021-12-08 NOTE — H&P (Addendum)
Chest pain Abnormal ETT with severe SBP elevation and persistent STD Risk factors including smoking Potential CMD/ANOCA w/u

## 2021-12-09 ENCOUNTER — Encounter (HOSPITAL_COMMUNITY): Payer: Self-pay | Admitting: Interventional Cardiology

## 2021-12-09 ENCOUNTER — Encounter (HOSPITAL_COMMUNITY)
Admission: RE | Disposition: A | Discharge status: Home / Self Care | Payer: Self-pay | Source: Home / Self Care | Attending: Interventional Cardiology

## 2021-12-09 ENCOUNTER — Other Ambulatory Visit: Payer: Self-pay

## 2021-12-09 ENCOUNTER — Ambulatory Visit (HOSPITAL_COMMUNITY)
Admission: RE | Admit: 2021-12-09 | Discharge: 2021-12-09 | Disposition: A | Discharge status: Home / Self Care | Payer: 59 | Attending: Interventional Cardiology | Admitting: Interventional Cardiology

## 2021-12-09 DIAGNOSIS — R079 Chest pain, unspecified: Secondary | ICD-10-CM | POA: Diagnosis not present

## 2021-12-09 DIAGNOSIS — R9439 Abnormal result of other cardiovascular function study: Secondary | ICD-10-CM | POA: Diagnosis present

## 2021-12-09 DIAGNOSIS — R42 Dizziness and giddiness: Secondary | ICD-10-CM | POA: Diagnosis not present

## 2021-12-09 DIAGNOSIS — Z79899 Other long term (current) drug therapy: Secondary | ICD-10-CM | POA: Insufficient documentation

## 2021-12-09 DIAGNOSIS — I208 Other forms of angina pectoris: Secondary | ICD-10-CM | POA: Insufficient documentation

## 2021-12-09 DIAGNOSIS — E785 Hyperlipidemia, unspecified: Secondary | ICD-10-CM | POA: Diagnosis present

## 2021-12-09 DIAGNOSIS — F17210 Nicotine dependence, cigarettes, uncomplicated: Secondary | ICD-10-CM | POA: Diagnosis not present

## 2021-12-09 DIAGNOSIS — E782 Mixed hyperlipidemia: Secondary | ICD-10-CM | POA: Insufficient documentation

## 2021-12-09 DIAGNOSIS — I209 Angina pectoris, unspecified: Secondary | ICD-10-CM

## 2021-12-09 DIAGNOSIS — G8194 Hemiplegia, unspecified affecting left nondominant side: Secondary | ICD-10-CM | POA: Diagnosis present

## 2021-12-09 DIAGNOSIS — Z7982 Long term (current) use of aspirin: Secondary | ICD-10-CM | POA: Insufficient documentation

## 2021-12-09 DIAGNOSIS — Z720 Tobacco use: Secondary | ICD-10-CM | POA: Diagnosis present

## 2021-12-09 DIAGNOSIS — I10 Essential (primary) hypertension: Secondary | ICD-10-CM | POA: Diagnosis not present

## 2021-12-09 DIAGNOSIS — R7303 Prediabetes: Secondary | ICD-10-CM | POA: Diagnosis present

## 2021-12-09 HISTORY — PX: LEFT HEART CATH AND CORONARY ANGIOGRAPHY: CATH118249

## 2021-12-09 SURGERY — LEFT HEART CATH AND CORONARY ANGIOGRAPHY
Anesthesia: LOCAL

## 2021-12-09 MED ORDER — LIDOCAINE HCL (PF) 1 % IJ SOLN
INTRAMUSCULAR | Status: DC | PRN
  Administered 2021-12-09: 2 mL

## 2021-12-09 MED ORDER — HYDRALAZINE HCL 20 MG/ML IJ SOLN: 10.0000 mg | INTRAMUSCULAR | Status: DC | PRN

## 2021-12-09 MED ORDER — SODIUM CHLORIDE 0.9% FLUSH: 3.0000 mL | INTRAVENOUS | Status: DC | PRN

## 2021-12-09 MED ORDER — FENTANYL CITRATE (PF) 100 MCG/2ML IJ SOLN
INTRAMUSCULAR | Status: AC
  Filled 2021-12-09: qty 2

## 2021-12-09 MED ORDER — LIDOCAINE HCL (PF) 1 % IJ SOLN
INTRAMUSCULAR | Status: AC
  Filled 2021-12-09: qty 30

## 2021-12-09 MED ORDER — VERAPAMIL HCL 2.5 MG/ML IV SOLN
INTRAVENOUS | Status: AC
  Filled 2021-12-09: qty 2

## 2021-12-09 MED ORDER — HEPARIN (PORCINE) IN NACL 1000-0.9 UT/500ML-% IV SOLN
INTRAVENOUS | Status: AC
  Filled 2021-12-09: qty 500

## 2021-12-09 MED ORDER — IOHEXOL 350 MG/ML SOLN
INTRAVENOUS | Status: DC | PRN
  Administered 2021-12-09: 35 mL

## 2021-12-09 MED ORDER — ACETAMINOPHEN 325 MG PO TABS: 650.0000 mg | ORAL_TABLET | ORAL | Status: DC | PRN

## 2021-12-09 MED ORDER — HEPARIN SODIUM (PORCINE) 1000 UNIT/ML IJ SOLN
INTRAMUSCULAR | Status: AC
  Filled 2021-12-09: qty 10

## 2021-12-09 MED ORDER — SODIUM CHLORIDE 0.9% FLUSH: 3.0000 mL | Freq: Two times a day (BID) | INTRAVENOUS | Status: DC

## 2021-12-09 MED ORDER — LABETALOL HCL 5 MG/ML IV SOLN: 10.0000 mg | INTRAVENOUS | Status: DC | PRN

## 2021-12-09 MED ORDER — MIDAZOLAM HCL 2 MG/2ML IJ SOLN
INTRAMUSCULAR | Status: DC | PRN
  Administered 2021-12-09 (×2): 1 mg via INTRAVENOUS

## 2021-12-09 MED ORDER — SODIUM CHLORIDE 0.9 % IV SOLN: 250.0000 mL | INTRAVENOUS | Status: DC | PRN

## 2021-12-09 MED ORDER — ONDANSETRON HCL 4 MG/2ML IJ SOLN: 4.0000 mg | Freq: Four times a day (QID) | INTRAMUSCULAR | Status: DC | PRN

## 2021-12-09 MED ORDER — VERAPAMIL HCL 2.5 MG/ML IV SOLN
INTRAVENOUS | Status: DC | PRN
  Administered 2021-12-09: 10 mL via INTRA_ARTERIAL

## 2021-12-09 MED ORDER — SODIUM CHLORIDE 0.9 % WEIGHT BASED INFUSION: 1.0000 mL/kg/h | INTRAVENOUS | Status: DC

## 2021-12-09 MED ORDER — SODIUM CHLORIDE 0.9 % WEIGHT BASED INFUSION
3.0000 mL/kg/h | INTRAVENOUS | Status: AC
  Administered 2021-12-09: 3 mL/kg/h via INTRAVENOUS

## 2021-12-09 MED ORDER — FENTANYL CITRATE (PF) 100 MCG/2ML IJ SOLN
INTRAMUSCULAR | Status: DC | PRN
  Administered 2021-12-09: 50 ug via INTRAVENOUS

## 2021-12-09 MED ORDER — SODIUM CHLORIDE 0.9 % IV SOLN: INTRAVENOUS | Status: DC

## 2021-12-09 MED ORDER — HEPARIN (PORCINE) IN NACL 1000-0.9 UT/500ML-% IV SOLN
INTRAVENOUS | Status: DC | PRN
  Administered 2021-12-09 (×2): 500 mL

## 2021-12-09 MED ORDER — MIDAZOLAM HCL 2 MG/2ML IJ SOLN
INTRAMUSCULAR | Status: AC
  Filled 2021-12-09: qty 2

## 2021-12-09 MED ORDER — ASPIRIN 81 MG PO CHEW
81.0000 mg | CHEWABLE_TABLET | ORAL | Status: AC
  Administered 2021-12-09: 81 mg via ORAL

## 2021-12-09 MED ORDER — HEPARIN SODIUM (PORCINE) 1000 UNIT/ML IJ SOLN
INTRAMUSCULAR | Status: DC | PRN
  Administered 2021-12-09: 5000 [IU] via INTRAVENOUS

## 2021-12-09 SURGICAL SUPPLY — 11 items
BAND ZEPHYR COMPRESS 30 LONG (HEMOSTASIS) IMPLANT
CATH 5FR JL3.5 JR4 ANG PIG MP (CATHETERS) IMPLANT
GLIDESHEATH SLEND A-KIT 6F 22G (SHEATH) IMPLANT
GUIDEWIRE INQWIRE 1.5J.035X260 (WIRE) IMPLANT
INQWIRE 1.5J .035X260CM (WIRE) ×1
KIT HEART LEFT (KITS) ×1 IMPLANT
KIT SYRINGE INJ CVI SPIKEX1 (MISCELLANEOUS) IMPLANT
PACK CARDIAC CATHETERIZATION (CUSTOM PROCEDURE TRAY) ×1 IMPLANT
SET ATX SIMPLICITY (MISCELLANEOUS) IMPLANT
SHEATH PROBE COVER 6X72 (BAG) IMPLANT
TUBING CIL FLEX 10 FLL-RA (TUBING) ×1 IMPLANT

## 2021-12-09 NOTE — Interval H&P Note (Signed)
Cath Lab Visit (complete for each Cath Lab visit)  Clinical Evaluation Leading to the Procedure:   ACS: No.  Non-ACS:    Anginal Classification: CCS III  Anti-ischemic medical therapy: Minimal Therapy (1 class of medications)  Non-Invasive Test Results: Intermediate-risk stress test findings: cardiac mortality 1-3%/year  Prior CABG: No previous CABG      History and Physical Interval Note:  12/09/2021 7:17 AM  Sarah Nicholson [name]  has presented today for surgery, with the diagnosis of positive stress test.  The various methods of treatment have been discussed with the patient and family. After consideration of risks, benefits and other options for treatment, the patient has consented to  Procedure(s): LEFT HEART CATH AND CORONARY ANGIOGRAPHY (N/A) as a surgical intervention.  The patient's history has been reviewed, patient examined, no change in status, stable for surgery.  I have reviewed the patient's chart and labs.  Questions were answered to the patient's satisfaction.     Belva Crome III

## 2021-12-09 NOTE — CV Procedure (Signed)
Right dominant with normal RCA. Left coronary is widely patent.  Angiography demonstrates generalized vasoconstriction.  Nitroglycerin was not given.  No obstructive disease is noted. No calcium by fluoroscopy. Normal LV function and LVEDP.  Abnormal exercise treadmill test contributed to by extreme blood pressure elevation in the setting where there could be microvascular dysfunction as well based upon left coronary angiographic images.

## 2021-12-21 ENCOUNTER — Encounter: Payer: Self-pay | Admitting: Diagnostic Neuroimaging

## 2021-12-21 ENCOUNTER — Ambulatory Visit (INDEPENDENT_AMBULATORY_CARE_PROVIDER_SITE_OTHER): Payer: 59 | Admitting: Diagnostic Neuroimaging

## 2021-12-21 VITALS — BP 158/82 | HR 60 | Ht 66.0 in | Wt 217.0 lb

## 2021-12-21 DIAGNOSIS — G4719 Other hypersomnia: Secondary | ICD-10-CM

## 2021-12-21 DIAGNOSIS — R42 Dizziness and giddiness: Secondary | ICD-10-CM

## 2021-12-21 DIAGNOSIS — R0683 Snoring: Secondary | ICD-10-CM

## 2021-12-21 DIAGNOSIS — I10 Essential (primary) hypertension: Secondary | ICD-10-CM | POA: Diagnosis not present

## 2021-12-21 NOTE — Patient Instructions (Signed)
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

## 2021-12-21 NOTE — Progress Notes (Signed)
[facility]  PATIENT: Sarah Nicholson DOB: 1970/06/17  REFERRING CLINICIAN: Silverio Decamp,* HISTORY FROM: patient REASON FOR VISIT: new consult   HISTORICAL  CHIEF COMPLAINT:  Chief Complaint  Patient presents with   New Patient (Initial Visit)    Pt reports feeling okay. She states having her episodes of being dizzy or feeling like she Is going to pass out since August 2023. She reports having chest pain with passing out. She is experiencing foot drop in her left foot. 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 She states no pain just cant see out of left eye when they occur and sometimes both eyes. Room 7 alone    HISTORY OF PRESENT ILLNESS:   51 year old female here for evaluation of dizziness, migraine, visual phenomenon, foot drop.  From childhood up to a 51 years old patient had migraine without aura.  She describes global throbbing pounding headaches with nausea, vomiting, sensitive to light and sound.  She is treated with over-the-counter medications and that Imitrex.  By age 78 years old migraine spontaneously stopped.  Around 2020 patient started having different problem of visual disturbance.  Sometimes she could not see what she was reading or seeing.  It would look like blurry wavy lines from heat or gasoline.  This would last 30 to 40 minutes.  These could occur 2 times per year.  One time she had loss of vision in left eye.  She had evaluation with neuroimaging studies, vascular studies, ophthalmology consultations.  These were diagnosed as ocular migraine phenomenon.  Over the last few years patient also was having problems with increasing hypertension, chest pain and dizziness.  Has had several evaluations over the past year.  Patient has almost daily basis of dizziness sensation.  She had abnormal exercise stress test.  She is under care of cardiology currently.  She she was noted to have some issues with left dorsiflexion weakness over  the last 1 year.  MRI of lumbar spine was unremarkable.  Denies any ankle injuries or traumas.  Has been having more problems with excessive daytime fatigue and sleepiness, reported snoring at home.   REVIEW OF SYSTEMS: Full 14 system review of systems performed and negative with exception of: as per HPI.  ALLERGIES: Allergies  Allergen Reactions   Macrobid [Nitrofurantoin] Hives and Rash   Other Hives    Preservative used in vaccinations   Tetanus Toxoids Swelling    Pt states she was told the Tdap injection would have to be administered in the hospital  After discussion on 11/10 pt remembers being told it was thimersol in the tetanus 30+years ago that caused the swelling.     Thimerosal (Thiomersal) Rash    HOME MEDICATIONS: Outpatient Medications Prior to Visit  Medication Sig Dispense Refill   amphetamine-dextroamphetamine (ADDERALL) 10 MG tablet Take 1 tablet (10 mg total) by mouth daily with breakfast. 30 tablet 0   aspirin EC 81 MG tablet Take 1 tablet (81 mg total) by mouth daily. 90 tablet 3   cetirizine (ZYRTEC) 10 MG tablet Take 20 mg by mouth daily.     clonazePAM (KLONOPIN) 0.5 MG tablet TAKE 1 TABLET(0.5 MG) BY MOUTH THREE TIMES DAILY AS NEEDED FOR ANXIETY 30 tablet 3   cloNIDine (CATAPRES) 0.1 MG tablet Take 1 tablet (0.1 mg total) by mouth 3 (three) times daily as needed (systolic blood pressure >562). 90 tablet 0   enalapril (VASOTEC) 10 MG tablet Take 1 tablet (10 mg total) by mouth 2 (two) times  daily. 60 tablet 1   escitalopram (LEXAPRO) 20 MG tablet Take 2 tablets (40 mg total) by mouth daily. 180 tablet 3   gabapentin (NEURONTIN) 300 MG capsule Take 1 capsule (300 mg total) by mouth 3 (three) times daily. 270 capsule 3   hydrochlorothiazide (HYDRODIURIL) 12.5 MG tablet TAKE 1 TABLET(12.5 MG) BY MOUTH DAILY 90 tablet 1   meclizine (ANTIVERT) 25 MG tablet Take 1 tablet (25 mg total) by mouth 3 (three) times daily as needed for dizziness. 30 tablet 0   omeprazole  (PRILOSEC) 40 MG capsule Take 1 capsule (40 mg total) by mouth in the morning and at bedtime. 60 capsule 3   rosuvastatin (CRESTOR) 20 MG tablet Take 1 tablet (20 mg total) by mouth daily. 90 tablet 3   varenicline (CHANTIX) 1 MG tablet Take 1 tablet (1 mg total) by mouth 2 (two) times daily. 60 tablet 3   No facility-administered medications prior to visit.    PAST MEDICAL HISTORY: Past Medical History:  Diagnosis Date   ADD (attention deficit disorder) 06/14/2012   Anxiety    Bronchitis    Complication of anesthesia 1987   states woke up during appedentomy   Depression 06/14/2012   Hepatic steatosis 11/13/2018   Hepatic steatosis with hepatomegaly seen on MRI abd on 11/2018   History of leukocytosis 06/14/2012   Reports "WBC 32" decades ago with negative hematology specialist workup.    Hyperlipidemia 06/14/2012   Hypertension    Seasonal allergies 06/14/2012    PAST SURGICAL HISTORY: Past Surgical History:  Procedure Laterality Date   APPENDECTOMY     BICEPT TENODESIS Right 10/10/2019   Procedure: BICEPS TENODESIS;  Surgeon: Hiram Gash, MD;  Location: [facility];  Service: Orthopedics;  Laterality: Right;   bladder sling/mesh     INGUINAL HERNIA REPAIR     intrauterine ablation     LEFT HEART CATH AND CORONARY ANGIOGRAPHY N/A 12/09/2021   Procedure: LEFT HEART CATH AND CORONARY ANGIOGRAPHY;  Surgeon: Belva Crome, MD;  Location: [facility];  Service: Cardiovascular;  Laterality: N/A;   NASAL SINUS SURGERY     TUBAL LIGATION      FAMILY HISTORY: Family History  Problem Relation Age of Onset   Depression Mother    Diabetes Father    Hyperlipidemia Father    Hypertension Father    Depression Sister    Breast cancer Maternal Grandmother    Depression Son     SOCIAL HISTORY: Social History   Socioeconomic History   Marital status: Married    Spouse name: Not on file   Number of children: Not on file   Years of education: Not on file    Highest education level: Not on file  Occupational History   Not on file  Tobacco Use   Smoking status: Every Day    Packs/day: 0.50    Types: Cigarettes   Smokeless tobacco: Never   Tobacco comments:    30 years , currently on Chantix  Vaping Use   Vaping Use: Never used  Substance and Sexual Activity   Alcohol use: Yes    Comment: 1-2 drinks every 6 months   Drug use: No   Sexual activity: Not on file  Other Topics Concern   Not on file  Social History Narrative   Not on file   Social Determinants of Health   Financial Resource Strain: Not on file  Food Insecurity: Not on file  Transportation Needs: Not on file  Physical Activity: Not on file  Stress: Not on file  Social Connections: Not on file  Intimate Partner Violence: Not on file     PHYSICAL EXAM  GENERAL EXAM/CONSTITUTIONAL: Vitals:  Vitals:   12/21/21 0844  BP: (!) 158/82  Pulse: 60  Weight: 217 lb (98.4 kg)  Height: '5\' 6"'$  (1.676 m)   Body mass index is 35.02 kg/m. Wt Readings from Last 3 Encounters:  12/21/21 217 lb (98.4 kg)  12/09/21 213 lb (96.6 kg)  12/06/21 213 lb 6.4 oz (96.8 kg)   Patient is in no distress; well developed, nourished and groomed; neck is supple  CARDIOVASCULAR: Examination of carotid arteries is normal; no carotid bruits Regular rate and rhythm, no murmurs Examination of peripheral vascular system by observation and palpation is normal  EYES: Ophthalmoscopic exam of optic discs and posterior segments is normal; no papilledema or hemorrhages No results found.  MUSCULOSKELETAL: Gait, strength, tone, movements noted in Neurologic exam below  NEUROLOGIC: MENTAL STATUS:      No data to display         awake, alert, oriented to person, place and time recent and remote memory intact normal attention and concentration language fluent, comprehension intact, naming intact fund of knowledge appropriate  CRANIAL NERVE:  2nd - no papilledema on fundoscopic  exam 2nd, 3rd, 4th, 6th - pupils equal and reactive to light, visual fields full to confrontation, extraocular muscles intact, no nystagmus 5th - facial sensation symmetric 7th - facial strength symmetric 8th - hearing intact 9th - palate elevates symmetrically, uvula midline 11th - shoulder shrug symmetric 12th - tongue protrusion midline  MOTOR:  normal bulk and tone, full strength in the BUE, BLE; FLUCTUATING MILD WEAKNESS IN LEFT FOOT DF; INV, EVERSION, PF NORMAL  SENSORY:  normal and symmetric to light touch, temperature, vibration  COORDINATION:  finger-nose-finger, fine finger movements normal  REFLEXES:  deep tendon reflexes present and symmetric  GAIT/STATION:  narrow based gait; able to walk on toes, heels; SLIGHT DIFF WITH LEFT HEEL WALKING     DIAGNOSTIC DATA (LABS, IMAGING, TESTING) - I reviewed patient records, labs, notes, testing and imaging myself where available.  Lab Results  Component Value Date   WBC 11.3 (H) 12/06/2021   HGB 14.4 12/06/2021   HCT 43.9 12/06/2021   MCV 88 12/06/2021   PLT 332 12/06/2021      Component Value Date/Time   NA 141 12/06/2021 1138   K 4.3 12/06/2021 1138   CL 107 (H) 12/06/2021 1138   CO2 23 12/06/2021 1138   GLUCOSE 110 (H) 12/06/2021 1138   GLUCOSE 120 11/03/2021 0000   BUN 13 12/06/2021 1138   CREATININE 0.93 12/06/2021 1138   CREATININE 1.09 (H) 11/03/2021 0000   CALCIUM 9.3 12/06/2021 1138   PROT 6.2 11/03/2021 0000   ALBUMIN 4.0 08/24/2021 0855   AST 12 11/03/2021 0000   AST 11 (L) 08/24/2021 0855   ALT 12 11/03/2021 0000   ALT 12 08/24/2021 0855   ALKPHOS 77 08/24/2021 0855   BILITOT 0.2 11/03/2021 0000   BILITOT 0.4 08/24/2021 0855   GFRNONAA >60 10/29/2021 1448   GFRNONAA >60 08/24/2021 0855   GFRNONAA 67 03/16/2020 1153   GFRAA 77 03/16/2020 1153   Lab Results  Component Value Date   CHOL 223 (H) 11/03/2021   HDL 36 (L) 11/03/2021   LDLCALC 136 (H) 11/03/2021   TRIG 360 (H) 11/03/2021    CHOLHDL 6.2 (H) 11/03/2021   Lab Results  Component Value Date  HGBA1C 5.8 (H) 03/05/2018   Lab Results  Component Value Date   VITAMINB12 413 11/03/2021   Lab Results  Component Value Date   TSH 1.69 11/03/2021    08/03/18 CTA head / neck Minimal atherosclerosis without large vessel occlusion or  significant stenosis in the head and neck.   08/03/18 MRI brain  No acute intracranial abnormality with minimal evidence of chronic  small vessel disease.   02/27/21 MRI lumbar spine Mild degenerative changes, without spinal canal stenosis or neural foraminal narrowing.  11/23/21 MRI brain [I reviewed images myself and agree with interpretation. -VRP]  1. No acute intracranial abnormality. 2. Mild chronic small vessel ischemic disease. 3. 12 mm T1 hyperintense focus in the posterior aspect of the sella. This is larger than a normal neurohypophysis (posterior pituitary bright spot) and could reflect a Rathke's cleft cyst or possibly pituitary microadenoma. Endocrine function tests are recommended. Follow-up pituitary protocol MRI with and without contrast should be performed in 6-12 months. This follows ACR consensus guidelines: Management of Incidental Pituitary Findings on CT, MRI and F18-FDG PET: A White Paper of the ACR Incidental Findings Committee. J Am Coll Radiol 2018; 15: 010-93.    ASSESSMENT AND PLAN  51 y.o. year old female here with: Full   Dx:  1. Dizziness   2. Hypertension, unspecified type   3. Snoring   4. 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    Penni Bombard, MD 23/55/7322, 0:25 AM Certified in Neurology, Neurophysiology and Neuroimaging  [facility] [address], [place], [place] [address] [phone]

## 2021-12-22 ENCOUNTER — Telehealth: Payer: Self-pay | Admitting: General Practice

## 2021-12-22 NOTE — Telephone Encounter (Signed)
Transition Care Management Follow-up Telephone Call Date of discharge and from where: 12/21/21 from [facility] How have you been since you were released from the hospital? She said she is doing ok. She has a cardiologist follow up.  Any questions or concerns? No  Items Reviewed: Did the pt receive and understand the discharge instructions provided? Yes  Medications obtained and verified? No  Other? No  Any new allergies since your discharge? No  Dietary orders reviewed? Yes Do you have support at home? Yes   Home Care and Equipment/Supplies: Were home health services ordered? no  Functional Questionnaire: (I = Independent and D = Dependent) ADLs: I  Bathing/Dressing- I  Meal Prep- I  Eating- I  Maintaining continence- I  Transferring/Ambulation- I  Managing Meds- I  Follow up appointments reviewed:  PCP Hospital f/u appt confirmed? No   Specialist Hospital f/u appt confirmed? Yes  Scheduled to see the cardiologist on 01/07/22. Are transportation arrangements needed? No  If their condition worsens, is the pt aware to call PCP or go to the Emergency Dept.? Yes Was the patient provided with contact information for the PCP's office or ED? Yes Was to pt encouraged to call back with questions or concerns? Yes

## 2021-12-28 ENCOUNTER — Ambulatory Visit
Admission: RE | Admit: 2021-12-28 | Discharge: 2021-12-28 | Disposition: A | Discharge status: Home / Self Care | Payer: 59 | Source: Ambulatory Visit | Attending: Sports Medicine | Admitting: Sports Medicine

## 2021-12-28 DIAGNOSIS — Z1231 Encounter for screening mammogram for malignant neoplasm of breast: Secondary | ICD-10-CM

## 2021-12-28 NOTE — Progress Notes (Addendum)
Office Visit    Patient Name: Sarah Nicholson Date of Encounter: 12/28/2021  Primary Care Provider:  Silverio Decamp, MD Primary Cardiologist:  None Primary Electrophysiologist: None  Chief Complaint    Sarah Nicholson Sarah Nicholson is a 51 y.o. female with PMH of HTN, HLD, ADD, anxiety, obesity, tobacco abuse, depression who presents today for hospital follow-up for chest pain.  Past Medical History    Past Medical History:  Diagnosis Date   ADD (attention deficit disorder) 06/14/2012   Anxiety    Bronchitis    Complication of anesthesia 1987   states woke up during appedentomy   Depression 06/14/2012   Hepatic steatosis 11/13/2018   Hepatic steatosis with hepatomegaly seen on MRI abd on 11/2018   History of leukocytosis 06/14/2012   Reports "WBC 32" decades ago with negative hematology specialist workup.    Hyperlipidemia 06/14/2012   Hypertension    Seasonal allergies 06/14/2012   Past Surgical History:  Procedure Laterality Date   APPENDECTOMY     BICEPT TENODESIS Right 10/10/2019   Procedure: BICEPS TENODESIS;  Surgeon: Hiram Gash, MD;  Location: [facility];  Service: Orthopedics;  Laterality: Right;   bladder sling/mesh     INGUINAL HERNIA REPAIR     intrauterine ablation     LEFT HEART CATH AND CORONARY ANGIOGRAPHY N/A 12/09/2021   Procedure: LEFT HEART CATH AND CORONARY ANGIOGRAPHY;  Surgeon: Belva Crome, MD;  Location: [facility];  Service: Cardiovascular;  Laterality: N/A;   NASAL SINUS SURGERY     TUBAL LIGATION      Allergies  Allergies  Allergen Reactions   Macrobid [Nitrofurantoin] Hives and Rash   Other Hives    Preservative used in vaccinations   Tetanus Toxoids Swelling    Pt states she was told the Tdap injection would have to be administered in the hospital  After discussion on 11/10 pt remembers being told it was thimersol in the tetanus 30+years ago that caused the swelling.     Thimerosal (Thiomersal)  Rash    History of Present Illness    Sarah Nicholson Sarah Nicholson  is a 51 year old female with the above mention past medical history who presents today for posthospital follow-up for complaint of chest pain.  Sarah Nicholson was initially seen by Dr. Johney Frame for complaint of chest pain and dizziness.  She underwent a exercise treadmill test on 11/30/2021 with 7.1 METS achieved with ST depression and frequent PVCs concerning for possible ischemia.  During her test patient's blood pressure was noted as 204/110.  She endorsed presyncope but has not passed out from her symptoms.  She reported occasional lower extremity edema and shortness of breath at night.  She is working on smoking cessation.  Based on her stress test findings patient was sent for [facility] that was performed by Dr. Tamala Julian on 12/08/2021 and revealed nonobstructive CAD with plan to consider possible work-up microvascular dysfunction (ANOCA).  Patient was also recommended to have better blood pressure control.  She was seen in the ED at [facility] on 12/21/2021 for complaint of chest pain.  Patient endorsed intermittent chest pain that radiated into her left arm.  She reported pain was relieved with nitroglycerin and denied any shortness of breath.  EKG was completed with no evidence of acute ischemia but did note nonspecific T wave changes.  Chest x-ray was normal and labs were notable for mild leukocytosis.  Troponins were negative and patient was  discharged following second trending troponin.  Sarah Nicholson presents today for post ED visit with her husband.  Since last being seen in the office patient reports that her hands decreased however she still experiences occasionally tightness with dizziness.  Her blood pressure today was well controlled at 98/78 heart rate of 74 bpm.  She states however that her blood pressures are up and down and at home they are in the 706C to 376E systolically and as high as 831 diastolically.  She is compliant  with her current medications and denies any adverse reactions currently.  She endorses occasional shortness of breath with physical activity that requires her to rest frequently.  We discussed the pathophysiology of hypertension and questions were answered to patient's satisfaction.  Patient denies chest pain, palpitations, dyspnea, PND, orthopnea, nausea, vomiting, dizziness, syncope, edema, weight gain, or early satiety.  Home Medications    Current Outpatient Medications  Medication Sig Dispense Refill   amphetamine-dextroamphetamine (ADDERALL) 10 MG tablet Take 1 tablet (10 mg total) by mouth daily with breakfast. 30 tablet 0   aspirin EC 81 MG tablet Take 1 tablet (81 mg total) by mouth daily. 90 tablet 3   cetirizine (ZYRTEC) 10 MG tablet Take 20 mg by mouth daily.     clonazePAM (KLONOPIN) 0.5 MG tablet TAKE 1 TABLET(0.5 MG) BY MOUTH THREE TIMES DAILY AS NEEDED FOR ANXIETY 30 tablet 3   cloNIDine (CATAPRES) 0.1 MG tablet Take 1 tablet (0.1 mg total) by mouth 3 (three) times daily as needed (systolic blood pressure >517). 90 tablet 0   enalapril (VASOTEC) 10 MG tablet Take 1 tablet (10 mg total) by mouth 2 (two) times daily. 60 tablet 1   escitalopram (LEXAPRO) 20 MG tablet Take 2 tablets (40 mg total) by mouth daily. 180 tablet 3   gabapentin (NEURONTIN) 300 MG capsule Take 1 capsule (300 mg total) by mouth 3 (three) times daily. 270 capsule 3   hydrochlorothiazide (HYDRODIURIL) 12.5 MG tablet TAKE 1 TABLET(12.5 MG) BY MOUTH DAILY 90 tablet 1   meclizine (ANTIVERT) 25 MG tablet Take 1 tablet (25 mg total) by mouth 3 (three) times daily as needed for dizziness. 30 tablet 0   omeprazole (PRILOSEC) 40 MG capsule Take 1 capsule (40 mg total) by mouth in the morning and at bedtime. 60 capsule 3   rosuvastatin (CRESTOR) 20 MG tablet Take 1 tablet (20 mg total) by mouth daily. 90 tablet 3   No current facility-administered medications for this visit.     Review of Systems  Please see the  history of present illness.    (+) Dizzy this (+) Waxing waning chest pain  All other systems reviewed and are otherwise negative except as noted above.  Physical Exam    Wt Readings from Last 3 Encounters:  12/21/21 217 lb (98.4 kg)  12/09/21 213 lb (96.6 kg)  12/06/21 213 lb 6.4 oz (96.8 kg)   OH:YWVPX were no vitals filed for this visit.,There is no height or weight on file to calculate BMI.  Constitutional:      Appearance: Healthy appearance. Not in distress.  Neck:     Vascular: JVD normal.  Pulmonary:     Effort: Pulmonary effort is normal.     Breath sounds: No wheezing. No rales. Diminished in the bases Cardiovascular:     Normal rate. Regular rhythm. Normal S1. Normal S2.      Murmurs: There is no murmur.  Edema:    Peripheral edema absent.  Abdominal:  Palpations: Abdomen is soft non tender. There is no hepatomegaly.  Skin:    General: Skin is warm and dry.  Neurological:     General: No focal deficit present.     Mental Status: Alert and oriented to person, place and time.     Cranial Nerves: Cranial nerves are intact.  EKG/LABS/Other Studies Reviewed    ECG personally reviewed by me today -none completed today   Lab Results  Component Value Date   WBC 11.3 (H) 12/06/2021   HGB 14.4 12/06/2021   HCT 43.9 12/06/2021   MCV 88 12/06/2021   PLT 332 12/06/2021   Lab Results  Component Value Date   CREATININE 0.93 12/06/2021   BUN 13 12/06/2021   NA 141 12/06/2021   K 4.3 12/06/2021   CL 107 (H) 12/06/2021   CO2 23 12/06/2021   Lab Results  Component Value Date   ALT 12 11/03/2021   AST 12 11/03/2021   ALKPHOS 77 08/24/2021   BILITOT 0.2 11/03/2021   Lab Results  Component Value Date   CHOL 223 (H) 11/03/2021   HDL 36 (L) 11/03/2021   LDLCALC 136 (H) 11/03/2021   TRIG 360 (H) 11/03/2021   CHOLHDL 6.2 (H) 11/03/2021    Lab Results  Component Value Date   HGBA1C 5.8 (H) 03/05/2018    Assessment & Plan    1.  Nonobstructive  CAD: -s/p LHC with normal coronaries with possible angina with nonobstructive CAD -Today patient reports no current chest pain however does state that her pain is waxing and waning at times and associated with some dizziness.  She describes her pain as squeezing substernal pressure. -We will start amlodipine 5 mg daily -Nitrostat 0.4 mg as needed for chest pain -Continue ASA 81 mg and Crestor 20 mg daily -Patient was advised to seek help in the ED if pain is not relieved with as needed Nitrostat and increases in intensity  2.  Chest pain:  -Seen recently at [facility] for complaint of chest pain. -Today patient reports waxing waning chest pain that was relieved with as needed nitroglycerin prior to her recent ED visit.  3.  Essential hypertension:  -Patient's blood pressure today was well controlled at 98/78 -She endorses pressures that are up and down at home in the 174Y and 814G systolically. -We will complete renal duplex to rule out secondary causes of hypertension -Continue clonidine 0.1 mg and enalapril 10 mg twice daily -We will discontinue HCTZ and start amlodipine as noted above -Patient was advised to only take clonidine if systolic is greater than 818  4.  Hyperlipidemia: -Patient's LDL was 136 and was started on Crestor during previous office visit -She will need repeat LFTs and lipids in 6 weeks -Continue Crestor 20 mg daily   5.  Tobacco abuse: -Cessation was encouraged  6.  Shortness of breath with exertion: -Patient reports shortness of breath that occurs with exertion that also is accompanied with palpitations. -We will update patient's 2D echo to evaluate for possible valvular and structural heart changes     Disposition: Follow-up with None or APP in 1 months    Medication Adjustments/Labs and Tests Ordered: Current medicines are reviewed at length with the patient today.  Concerns regarding medicines are outlined above.    Signed, Mable Fill, Marissa Nestle, NP 12/28/2021, 4:50 PM [facility]  Note:  This document was prepared using Dragon voice recognition software and may include unintentional dictation errors.

## 2021-12-29 ENCOUNTER — Ambulatory Visit (INDEPENDENT_AMBULATORY_CARE_PROVIDER_SITE_OTHER): Payer: 59 | Admitting: Neurology

## 2021-12-29 ENCOUNTER — Encounter: Payer: Self-pay | Admitting: Neurology

## 2021-12-29 VITALS — BP 115/70 | HR 64 | Ht 66.0 in | Wt 215.2 lb

## 2021-12-29 DIAGNOSIS — R351 Nocturia: Secondary | ICD-10-CM

## 2021-12-29 DIAGNOSIS — R519 Headache, unspecified: Secondary | ICD-10-CM

## 2021-12-29 DIAGNOSIS — R0683 Snoring: Secondary | ICD-10-CM | POA: Diagnosis not present

## 2021-12-29 DIAGNOSIS — E669 Obesity, unspecified: Secondary | ICD-10-CM

## 2021-12-29 DIAGNOSIS — G4719 Other hypersomnia: Secondary | ICD-10-CM

## 2021-12-29 DIAGNOSIS — Z789 Other specified health status: Secondary | ICD-10-CM

## 2021-12-29 NOTE — Progress Notes (Signed)
Subjective:    Patient ID: Sarah Nicholson is a 51 y.o. female.  HPI    Star Age, MD, PhD [facility] [address] [place], [place] [address]   Dear Bonnita Levan,  I saw your patient, Sarah Nicholson, upon your kind request in my sleep clinic today for initial consultation of her sleep disorder, in particular, concern for underlying obstructive sleep apnea.  The patient is unaccompanied today.  As you know, [name] is a 51 year old female with an underlying medical history of ADD, migraine headaches, hypertension, hyperlipidemia, allergies, depression, anxiety, hepatic steatosis, dizziness, and obesity, who reports snoring and excessive daytime somnolence.  I reviewed your office note from 12/21/2021.  Her Epworth sleepiness score is 11 out of 24, fatigue severity score is 44 out of 63.  She has had fluctuating blood pressure values, takes clonidine as needed.  She works as a Estate manager/land agent.  Bedtime is generally between 9 and 10 PM and rise time between 5 and 7 AM.  She does not have a TV in her bedroom, she lives with her husband, she has 3 grown children, she has 3 dogs, 2 cats and 2 fish (in a pond outside) as pets, one of the dogs sleeps on her bed usually.  She has had ocular migraines.  She has a nephew with sleep apnea.  Weight has gradually increased over the past several years.  She does drink quite a bit of caffeine in the form of diet soda, close to 4 L/day.  She is working on smoking cessation, is down to 2 cigarettes/day.  She drinks alcohol very rarely.  She has nocturia about once per average night, has had rare morning headaches.  She has seen cardiology for chest pain.  She has a follow-up appointment tomorrow.  Her Past Medical History Is Significant For: Past Medical History:  Diagnosis Date   ADD (attention deficit disorder) 06/14/2012   Anxiety    Bronchitis    Complication of anesthesia 1987   states woke  up during appedentomy   Depression 06/14/2012   Hepatic steatosis 11/13/2018   Hepatic steatosis with hepatomegaly seen on MRI abd on 11/2018   History of leukocytosis 06/14/2012   Reports "WBC 32" decades ago with negative hematology specialist workup.    Hyperlipidemia 06/14/2012   Hypertension    Seasonal allergies 06/14/2012    Her Past Surgical History Is Significant For: Past Surgical History:  Procedure Laterality Date   APPENDECTOMY     BICEPT TENODESIS Right 10/10/2019   Procedure: BICEPS TENODESIS;  Surgeon: Hiram Gash, MD;  Location: [facility];  Service: Orthopedics;  Laterality: Right;   bladder sling/mesh     INGUINAL HERNIA REPAIR     intrauterine ablation     LEFT HEART CATH AND CORONARY ANGIOGRAPHY N/A 12/09/2021   Procedure: LEFT HEART CATH AND CORONARY ANGIOGRAPHY;  Surgeon: Belva Crome, MD;  Location: [facility];  Service: Cardiovascular;  Laterality: N/A;   NASAL SINUS SURGERY     TUBAL LIGATION      Her Family History Is Significant For: Family History  Problem Relation Age of Onset   Depression Mother    Diabetes Father    Hyperlipidemia Father    Hypertension Father    Depression Sister    Breast cancer Maternal Grandmother    Depression Son    Sleep apnea Neg Hx     Her Social History Is Significant For: Social  History   Socioeconomic History   Marital status: Married    Spouse name: Not on file   Number of children: Not on file   Years of education: Not on file   Highest education level: Not on file  Occupational History   Not on file  Tobacco Use   Smoking status: Every Day    Packs/day: 0.25    Types: Cigarettes   Smokeless tobacco: Never   Tobacco comments:    30 years , currently on Chantix  Vaping Use   Vaping Use: Never used  Substance and Sexual Activity   Alcohol use: Not Currently   Drug use: No   Sexual activity: Not on file  Other Topics Concern   Not on file  Social History Narrative    Not on file   Social Determinants of Health   Financial Resource Strain: Not on file  Food Insecurity: Not on file  Transportation Needs: Not on file  Physical Activity: Not on file  Stress: Not on file  Social Connections: Not on file    Her Allergies Are:  Allergies  Allergen Reactions   Macrobid [Nitrofurantoin] Hives and Rash   Other Hives    Preservative used in vaccinations   Tetanus Toxoids Swelling    Pt states she was told the Tdap injection would have to be administered in the hospital  After discussion on 11/10 pt remembers being told it was thimersol in the tetanus 30+years ago that caused the swelling.     Thimerosal (Thiomersal) Rash  :   Her Current Medications Are:  Outpatient Encounter Medications as of 12/29/2021  Medication Sig   amphetamine-dextroamphetamine (ADDERALL) 10 MG tablet Take 1 tablet (10 mg total) by mouth daily with breakfast.   aspirin EC 81 MG tablet Take 1 tablet (81 mg total) by mouth daily.   cetirizine (ZYRTEC) 10 MG tablet Take 20 mg by mouth daily.   clonazePAM (KLONOPIN) 0.5 MG tablet TAKE 1 TABLET(0.5 MG) BY MOUTH THREE TIMES DAILY AS NEEDED FOR ANXIETY   cloNIDine (CATAPRES) 0.1 MG tablet Take 1 tablet (0.1 mg total) by mouth 3 (three) times daily as needed (systolic blood pressure >242).   enalapril (VASOTEC) 10 MG tablet Take 1 tablet (10 mg total) by mouth 2 (two) times daily.   escitalopram (LEXAPRO) 20 MG tablet Take 2 tablets (40 mg total) by mouth daily.   gabapentin (NEURONTIN) 300 MG capsule Take 1 capsule (300 mg total) by mouth 3 (three) times daily.   hydrochlorothiazide (HYDRODIURIL) 12.5 MG tablet TAKE 1 TABLET(12.5 MG) BY MOUTH DAILY   meclizine (ANTIVERT) 25 MG tablet Take 1 tablet (25 mg total) by mouth 3 (three) times daily as needed for dizziness.   omeprazole (PRILOSEC) 40 MG capsule Take 1 capsule (40 mg total) by mouth in the morning and at bedtime.   rosuvastatin (CRESTOR) 20 MG tablet Take 1 tablet (20 mg  total) by mouth daily.   No facility-administered encounter medications on file as of 12/29/2021.  :   Review of Systems:  Out of a complete 14 point review of systems, all are reviewed and negative with the exception of these symptoms as listed below:  Review of Systems  Neurological:        Pt here for sleep consult Pt snores,headaches,fatigue,hypertension  Pt denies sleep study,CPAP machine     ESS: FSS:    Objective:  Neurological Exam  Physical Exam Physical Examination:   Vitals:   12/29/21 1516  BP: 115/70  Pulse: 64  General Examination: The patient is a very pleasant 51 y.o. female in no acute distress. She appears well-developed and well-nourished and well groomed.   HEENT: Normocephalic, atraumatic, pupils are equal, round and reactive to light, corrective eyeglasses in place.  Extraocular tracking is good without limitation to gaze excursion or nystagmus noted. Hearing is grossly intact. Face is symmetric with normal facial animation. Speech is clear with no dysarthria noted. There is no hypophonia. There is no lip, neck/head, jaw or voice tremor. Neck is supple with full range of passive and active motion. There are no carotid bruits on auscultation. Oropharynx exam reveals: mild mouth dryness, adequate dental hygiene and moderate airway crowding, due to small airway entry, tonsillar size of about 1+, redundant soft palate.  Wider tongue noted, Mallampati class II, mild overbite.  Tongue protrudes centrally and palate elevates symmetrically, neck circumference of 15-1/4 inches.  Chest: Clear to auscultation without wheezing, rhonchi or crackles noted.  Heart: S1+S2+0, regular and normal without murmurs, rubs or gallops noted.   Abdomen: Soft, non-tender and non-distended.  Extremities: There is no pitting edema in the distal lower extremities bilaterally.   Skin: Warm and dry without trophic changes noted.   Musculoskeletal: exam reveals no obvious joint  deformities.   Neurologically:  Mental status: The patient is awake, alert and oriented in all 4 spheres. Her immediate and remote memory, attention, language skills and fund of knowledge are appropriate. There is no evidence of aphasia, agnosia, apraxia or anomia. Speech is clear with normal prosody and enunciation. Thought process is linear. Mood is normal and affect is normal.  Cranial nerves II - XII are as described above under HEENT exam.  Motor exam: Normal bulk, strength and tone is noted. There is no obvious action or resting tremor.  Fine motor skills and coordination: grossly intact.  Cerebellar testing: No dysmetria or intention tremor. There is no truncal or gait ataxia.  Sensory exam: intact to light touch in the upper and lower extremities.  Gait, station and balance: She stands easily. No veering to one side is noted. No leaning to one side is noted. Posture is age-appropriate and stance is narrow based. Gait shows normal stride length and normal pace. No problems turning are noted.   Assessment and Plan:  In summary, Sarah Nicholson is a very pleasant 51 y.o.-year old female with an underlying medical history of ADD, migraine headaches, hypertension, hyperlipidemia, allergies, depression, anxiety, hepatic steatosis, dizziness, and obesity, whose history and physical exam are concerning for sleep disordered breathing, supporting a current working diagnosis of unspecified sleep apnea, with the main differential diagnoses of obstructive sleep apnea (OSA) versus upper airway resistance syndrome (UARS) versus central sleep apnea (CSA), or mixed sleep apnea. A laboratory attended sleep study is considered gold standard for evaluation of sleep disordered breathing and is recommended at this time and clinically justified.   I had a long chat with the patient about my findings and the diagnosis of sleep apnea, particularly OSA, its prognosis and treatment options. We talked about  medical/conservative treatments, surgical interventions and non-pharmacological approaches for symptom control. I explained, in particular, the risks and ramifications of untreated moderate to severe OSA, especially with respect to developing cardiovascular disease down the road, including congestive heart failure (CHF), difficult to treat hypertension, cardiac arrhythmias (particularly A-fib), neurovascular complications including TIA, stroke and dementia. Even type 2 diabetes has, in part, been linked to untreated OSA. Symptoms of untreated OSA may include (but may not be limited to) daytime  sleepiness, nocturia (i.e. frequent nighttime urination), memory problems, mood irritability and suboptimally controlled or worsening mood disorder such as depression and/or anxiety, lack of energy, lack of motivation, physical discomfort, as well as recurrent headaches, especially morning or nocturnal headaches. We talked about the importance of maintaining a healthy lifestyle and striving for healthy weight.  The importance of complete smoking cessation was also addressed.  In addition, we talked about the importance of striving for and maintaining good sleep hygiene. I recommended the following at this time: sleep study.  I outlined the differences between a laboratory attended sleep study which is considered more comprehensive and accurate over the option of a home sleep test (HST); the latter may lead to underestimation of sleep disordered breathing in some instances and does not help with diagnosing upper airway resistance syndrome and is not accurate enough to diagnose primary central sleep apnea typically. I explained the different sleep test procedures to the patient in detail and also outlined possible surgical and non-surgical treatment options of OSA, including the use of a pressure airway pressure (PAP) device (ie CPAP, AutoPAP/APAP or BiPAP in certain circumstances), a custom-made dental device (aka oral  appliance, which would require a referral to a specialist dentist or orthodontist typically, and is generally speaking not considered a good choice for patients with full dentures or edentulous state), upper airway surgical options, such as traditional UPPP (which is not considered a first-line treatment) or the Inspire device (hypoglossal nerve stimulator, which would involve a referral for consultation with an ENT surgeon, after careful selection, following inclusion criteria). I explained the PAP treatment option to the patient in detail, as this is generally considered first-line treatment.  The patient indicated that she would be willing to try PAP therapy, if the need arises. I explained the importance of being compliant with PAP treatment, not only for insurance purposes but primarily to improve patient's symptoms symptoms, and for the patient's long term health benefit, including to reduce Her cardiovascular risks longer-term.    We will pick up our discussion about the next steps and treatment options after testing.  We will keep her posted as to the test results by phone call and/or MyChart messaging where possible.  We will plan to follow-up in sleep clinic accordingly as well.  I answered all her questions today and the patient was in agreement.   I encouraged her to call with any interim questions, concerns, problems or updates or email [place] through [place].  Generally speaking, sleep test authorizations may take up to 2 weeks, sometimes less, sometimes longer, the patient is encouraged to get in touch with [place] if they do not hear back from the sleep lab staff directly within the next 2 weeks.  Thank you very much for allowing me to participate in the care of this nice patient. If I can be of any further assistance to you please do not hesitate to talk to me.  Sincerely,   Star Age, MD, PhD

## 2021-12-29 NOTE — Patient Instructions (Signed)

## 2021-12-30 ENCOUNTER — Encounter: Payer: Self-pay | Admitting: Nurse Practitioner

## 2021-12-30 ENCOUNTER — Ambulatory Visit: Payer: 59 | Attending: Internal Medicine | Admitting: Nurse Practitioner

## 2021-12-30 ENCOUNTER — Other Ambulatory Visit: Payer: Self-pay | Admitting: Sports Medicine

## 2021-12-30 ENCOUNTER — Telehealth: Payer: Self-pay | Admitting: Cardiology

## 2021-12-30 VITALS — BP 98/78 | HR 74 | Ht 66.0 in | Wt 213.0 lb

## 2021-12-30 DIAGNOSIS — R079 Chest pain, unspecified: Secondary | ICD-10-CM

## 2021-12-30 DIAGNOSIS — I2510 Atherosclerotic heart disease of native coronary artery without angina pectoris: Secondary | ICD-10-CM

## 2021-12-30 DIAGNOSIS — R0602 Shortness of breath: Secondary | ICD-10-CM | POA: Diagnosis not present

## 2021-12-30 DIAGNOSIS — R928 Other abnormal and inconclusive findings on diagnostic imaging of breast: Secondary | ICD-10-CM

## 2021-12-30 DIAGNOSIS — E782 Mixed hyperlipidemia: Secondary | ICD-10-CM

## 2021-12-30 DIAGNOSIS — Z720 Tobacco use: Secondary | ICD-10-CM

## 2021-12-30 DIAGNOSIS — Z0279 Encounter for issue of other medical certificate: Secondary | ICD-10-CM

## 2021-12-30 DIAGNOSIS — I159 Secondary hypertension, unspecified: Secondary | ICD-10-CM

## 2021-12-30 MED ORDER — NITROGLYCERIN 0.4 MG SL SUBL: 0.4000 mg | SUBLINGUAL_TABLET | SUBLINGUAL | 3 refills | Status: DC | PRN

## 2021-12-30 MED ORDER — AMLODIPINE BESYLATE 5 MG PO TABS: 5.0000 mg | ORAL_TABLET | Freq: Every day | ORAL | 3 refills | Status: DC

## 2021-12-30 NOTE — Telephone Encounter (Signed)
Pt saw Ambrose Pancoast NP in clinic today.    Advised front office team to provide Ambrose Pancoast NP with the pts FMLA forms being he saw her in clinic today.  [name] office is aware that if Dr Johney Frame needs to assist [name] with completing these forms, then she will be more than happy to do so.   [name] with [employer] verbalized understanding and agrees with this plan.  She will place FMLA forms in his mailbox for completion.

## 2021-12-30 NOTE — Patient Instructions (Signed)
Medication Instructions:  Your physician has recommended you make the following change in your medication:   DISCONTINUE: Hydrochlorothiazide START: Nitroglycerin 0.'4mg'$  as needed START: Amlodipine '5mg'$  daily  *If you need a refill on your cardiac medications before your next appointment, please call your pharmacy*   Lab Work: None  If you have labs (blood work) drawn today and your tests are completely normal, you will receive your results only by: [place] (if you have MyChart) OR A paper copy in the mail If you have any lab test that is abnormal or we need to change your treatment, we will call you to review the results.   Testing/Procedures: Your physician has requested that you have an echocardiogram. Echocardiography is a painless test that uses sound waves to create images of your heart. It provides your doctor with information about the size and shape of your heart and how well your heart's chambers and valves are working. This procedure takes approximately one hour. There are no restrictions for this procedure. Please do NOT wear cologne, perfume, aftershave, or lotions (deodorant is allowed). Please arrive 15 minutes prior to your appointment time.  Your physician has requested that you have a renal artery duplex. During this test, an ultrasound is used to evaluate blood flow to the kidneys. Allow one hour for this exam. Do not eat after midnight the day before and avoid carbonated beverages. Take your medications as you usually do.   Follow-Up: At [facility], you and your health needs are our priority.  As part of our continuing mission to provide you with exceptional heart care, we have created designated Provider Care Teams.  These Care Teams include your primary Cardiologist (physician) and Advanced Practice Providers (APPs -  Physician Assistants and Nurse Practitioners) who all work together to provide you with the care you need, when you need it.   Your  next appointment:   1 month(s)  The format for your next appointment:   In Person  Provider:   Gwyndolyn Kaufman, MD or Ambrose Pancoast, NP   Other Instructions Check your blood pressure for 2 weeks and send [place] the readings via MyChart or you can call us.   Important Information About [place]

## 2021-12-30 NOTE — Telephone Encounter (Signed)
[employer] Liter FMLA form placed in Dr. Jacolyn Reedy box.

## 2021-12-31 ENCOUNTER — Ambulatory Visit (HOSPITAL_COMMUNITY): Payer: 59 | Attending: Cardiology

## 2021-12-31 DIAGNOSIS — R0602 Shortness of breath: Secondary | ICD-10-CM | POA: Insufficient documentation

## 2021-12-31 LAB — ECHOCARDIOGRAM COMPLETE
AR max vel: 2.25 cm2
AV Area VTI: 2.22 cm2
AV Area mean vel: 2.05 cm2
AV Mean grad: 6.5 mmHg
AV Peak grad: 12.6 mmHg
Ao pk vel: 1.78 m/s
Area-P 1/2: 3.31 cm2
S' Lateral: 2.7 cm

## 2022-01-07 ENCOUNTER — Ambulatory Visit
Admission: RE | Admit: 2022-01-07 | Discharge: 2022-01-07 | Disposition: A | Discharge status: Home / Self Care | Payer: 59 | Source: Ambulatory Visit | Attending: Sports Medicine | Admitting: Sports Medicine

## 2022-01-07 ENCOUNTER — Ambulatory Visit: Payer: 59 | Admitting: Nurse Practitioner

## 2022-01-07 ENCOUNTER — Ambulatory Visit: Payer: 59 | Admitting: Internal Medicine

## 2022-01-07 DIAGNOSIS — R928 Other abnormal and inconclusive findings on diagnostic imaging of breast: Secondary | ICD-10-CM

## 2022-01-11 ENCOUNTER — Ambulatory Visit (HOSPITAL_COMMUNITY)
Admission: RE | Admit: 2022-01-11 | Discharge: 2022-01-11 | Disposition: A | Discharge status: Home / Self Care | Payer: 59 | Source: Ambulatory Visit | Attending: Nurse Practitioner | Admitting: Nurse Practitioner

## 2022-01-11 DIAGNOSIS — I158 Other secondary hypertension: Secondary | ICD-10-CM | POA: Diagnosis not present

## 2022-01-11 DIAGNOSIS — I159 Secondary hypertension, unspecified: Secondary | ICD-10-CM | POA: Insufficient documentation

## 2022-01-13 ENCOUNTER — Telehealth: Payer: Self-pay | Admitting: Nurse Practitioner

## 2022-01-13 DIAGNOSIS — K551 Chronic vascular disorders of intestine: Secondary | ICD-10-CM

## 2022-01-13 DIAGNOSIS — I771 Stricture of artery: Secondary | ICD-10-CM

## 2022-01-13 NOTE — Telephone Encounter (Signed)
-----   Message from Sarah Lund., NP sent at 01/13/2022  2:17 PM EDT ----- Addendum:  Please let 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  These results indicate no secondary cause of hypertension.  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  Plan: Per Dr. Johney Frame please refer to vascular for further recommendations and treatment plan.  Please let me know if you have any additional questions  Sarah Pancoast, NP

## 2022-01-13 NOTE — Telephone Encounter (Signed)
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 Referral placed at this time.

## 2022-01-18 ENCOUNTER — Encounter: Payer: Self-pay | Admitting: Neurology

## 2022-01-20 ENCOUNTER — Ambulatory Visit (INDEPENDENT_AMBULATORY_CARE_PROVIDER_SITE_OTHER): Payer: 59 | Admitting: Obstetrics & Gynecology

## 2022-01-20 ENCOUNTER — Other Ambulatory Visit (HOSPITAL_COMMUNITY)
Admission: RE | Admit: 2022-01-20 | Discharge: 2022-01-20 | Disposition: A | Discharge status: Home / Self Care | Payer: 59 | Source: Ambulatory Visit | Attending: Obstetrics & Gynecology | Admitting: Obstetrics & Gynecology

## 2022-01-20 ENCOUNTER — Encounter: Payer: Self-pay | Admitting: Obstetrics & Gynecology

## 2022-01-20 VITALS — BP 132/83 | HR 68 | Ht 66.0 in | Wt 215.0 lb

## 2022-01-20 DIAGNOSIS — Z01419 Encounter for gynecological examination (general) (routine) without abnormal findings: Secondary | ICD-10-CM | POA: Diagnosis present

## 2022-01-20 NOTE — Progress Notes (Signed)
GYNECOLOGY ANNUAL PREVENTATIVE CARE ENCOUNTER NOTE  History:     Cheyenna Pankowski is a 51 y.o. [phone] female here for a routine annual gynecologic exam.  Current complaints: some vaginal dryness with intercourse, non-debilitating hot flashes and night sweats.   Denies abnormal vaginal bleeding, discharge, pelvic pain, other problems with intercourse or other gynecologic concerns.    Gynecologic History No LMP recorded (lmp unknown). Patient is postmenopausal. Contraception: post menopausal status Last Pap: 03/05/2018. Result was normal with negative HPV Last Mammogram: 01/07/22.  Result was normal Last Cologuard: 11/29/2021.  Result was normal  Obstetric History OB History  Gravida Para Term Preterm AB Living  '5 4 2 2 1 3  '$ SAB IAB Ectopic Multiple Live Births               # Outcome Date GA Lbr Len/2nd Weight Sex Delivery Anes PTL Lv  5 AB           4 Preterm           3 Preterm           2 Term           1 Term             Past Medical History:  Diagnosis Date   ADD (attention deficit disorder) 06/14/2012   Anxiety    Bronchitis    Complication of anesthesia 1987   states woke up during appedentomy   Depression 06/14/2012   Hepatic steatosis 11/13/2018   Hepatic steatosis with hepatomegaly seen on MRI abd on 11/2018   History of leukocytosis 06/14/2012   Reports "WBC 32" decades ago with negative hematology specialist workup.    Hyperlipidemia 06/14/2012   Hypertension    Seasonal allergies 06/14/2012    Past Surgical History:  Procedure Laterality Date   APPENDECTOMY     BICEPT TENODESIS Right 10/10/2019   Procedure: BICEPS TENODESIS;  Surgeon: Hiram Gash, MD;  Location: [facility];  Service: Orthopedics;  Laterality: Right;   bladder sling/mesh     INGUINAL HERNIA REPAIR     intrauterine ablation     LEFT HEART CATH AND CORONARY ANGIOGRAPHY N/A 12/09/2021   Procedure: LEFT HEART CATH AND CORONARY ANGIOGRAPHY;  Surgeon: Belva Crome,  MD;  Location: [facility];  Service: Cardiovascular;  Laterality: N/A;   NASAL SINUS SURGERY     TUBAL LIGATION      Current Outpatient Medications on File Prior to Visit  Medication Sig Dispense Refill   amLODipine (NORVASC) 5 MG tablet Take 1 tablet (5 mg total) by mouth daily. 90 tablet 3   amphetamine-dextroamphetamine (ADDERALL) 10 MG tablet Take 1 tablet (10 mg total) by mouth daily with breakfast. (Patient taking differently: Take 10 mg by mouth daily with breakfast. Taking PRN) 30 tablet 0   aspirin EC 81 MG tablet Take 1 tablet (81 mg total) by mouth daily. 90 tablet 3   cetirizine (ZYRTEC) 10 MG tablet Take 20 mg by mouth daily.     clonazePAM (KLONOPIN) 0.5 MG tablet TAKE 1 TABLET(0.5 MG) BY MOUTH THREE TIMES DAILY AS NEEDED FOR ANXIETY 30 tablet 3   enalapril (VASOTEC) 10 MG tablet Take 1 tablet (10 mg total) by mouth 2 (two) times daily. 60 tablet 1   escitalopram (LEXAPRO) 20 MG tablet Take 2 tablets (40 mg total) by mouth daily. 180 tablet 3   gabapentin (NEURONTIN) 300 MG capsule Take 1 capsule (300 mg total) by mouth  3 (three) times daily. 270 capsule 3   meclizine (ANTIVERT) 25 MG tablet Take 1 tablet (25 mg total) by mouth 3 (three) times daily as needed for dizziness. 30 tablet 0   nitroGLYCERIN (NITROSTAT) 0.4 MG SL tablet Place 1 tablet (0.4 mg total) under the tongue every 5 (five) minutes as needed for chest pain. 25 tablet 3   omeprazole (PRILOSEC) 40 MG capsule Take 1 capsule (40 mg total) by mouth in the morning and at bedtime. 60 capsule 3   rosuvastatin (CRESTOR) 20 MG tablet Take 1 tablet (20 mg total) by mouth daily. 90 tablet 3   cloNIDine (CATAPRES) 0.1 MG tablet Take 1 tablet (0.1 mg total) by mouth 3 (three) times daily as needed (systolic blood pressure >027). (Patient not taking: Reported on 12/30/2021) 90 tablet 0   No current facility-administered medications on file prior to visit.    Allergies  Allergen Reactions   Macrobid [Nitrofurantoin]  Hives and Rash   Other Hives    Preservative used in vaccinations   Tetanus Toxoids Swelling    Pt states she was told the Tdap injection would have to be administered in the hospital  After discussion on 11/10 pt remembers being told it was thimersol in the tetanus 30+years ago that caused the swelling.     Thimerosal (Thiomersal) Rash    Social History:  reports that she has been smoking cigarettes. She has been smoking an average of .25 packs per day. She has never used smokeless tobacco. She reports that she does not currently use alcohol. She reports that she does not use drugs.  Family History  Problem Relation Age of Onset   Depression Mother    Diabetes Father    Hyperlipidemia Father    Hypertension Father    Depression Sister    Breast cancer Maternal Grandmother    Depression Son    Sleep apnea Neg Hx     The following portions of the patient's history were reviewed and updated as appropriate: allergies, current medications, past family history, past medical history, past social history, past surgical history and problem list.  Review of Systems Pertinent items noted in HPI and remainder of comprehensive ROS otherwise negative.  Physical Exam:  BP 132/83   Pulse 68   Ht '5\' 6"'$  (1.676 m)   Wt 215 lb (97.5 kg)   LMP  (LMP Unknown)   BMI 34.70 kg/m  CONSTITUTIONAL: Well-developed, well-nourished female in no acute distress.  HENT:  Normocephalic, atraumatic, External right and left ear normal.  EYES: Conjunctivae and EOM are normal. Pupils are equal, round, and reactive to light. No scleral icterus.  NECK: Normal range of motion, supple, no masses.  Normal thyroid.  SKIN: Skin is warm and dry. No rash noted. Not diaphoretic. No erythema. No pallor. MUSCULOSKELETAL: Normal range of motion. No tenderness.  No cyanosis, clubbing, or edema. NEUROLOGIC: Alert and oriented to person, place, and time. Normal reflexes, muscle tone coordination.  PSYCHIATRIC: Normal mood  and affect. Normal behavior. Normal judgment and thought content. CARDIOVASCULAR: Normal heart rate noted, regular rhythm RESPIRATORY: Clear to auscultation bilaterally. Effort and breath sounds normal, no problems with respiration noted. BREASTS: Symmetric in size. No masses, tenderness, skin changes, nipple drainage, or lymphadenopathy bilaterally. Performed in the presence of a chaperone. ABDOMEN: Soft, no distention noted.  No tenderness, rebound or guarding.  PELVIC: Normal appearing external genitalia and urethral meatus; normal appearing vaginal mucosa and cervix with mild atrophy.  No abnormal vaginal discharge noted.  Pap  smear obtained.  Normal uterine size, no other palpable masses, no uterine or adnexal tenderness.  Performed in the presence of a chaperone.   Assessment and Plan:     Well woman exam with routine gynecological exam - Cytology - PAP( [facility]) Will follow up results of pap smear and manage accordingly. Mammogram and colon cancer screening are up to date Discussed use of water-based lubricants during intercourse, hyaluronic acid vaginal moisturizers (Hylafem or Revaree) to help with vaginal dryness. If not helped with these options, will consider vaginal estrogen therapy. As for the hot flashes and night sweats, patient is already on Neurontin and Lexapro, recommended taking Lexapro at night and increasing nighttime dosage of Neurontin to 600 mg.  Routine preventative health maintenance measures emphasized. Please refer to After Visit Summary for other counseling recommendations.      Verita Schneiders, MD, [place] [facility] [employer], [place]

## 2022-01-20 NOTE — Patient Instructions (Signed)
Hylafem Revaree

## 2022-01-24 LAB — CYTOLOGY - PAP
Comment: NEGATIVE
Diagnosis: NEGATIVE
High risk HPV: NEGATIVE

## 2022-01-25 ENCOUNTER — Ambulatory Visit (INDEPENDENT_AMBULATORY_CARE_PROVIDER_SITE_OTHER): Payer: 59 | Admitting: Vascular Surgery

## 2022-01-25 ENCOUNTER — Encounter: Payer: Self-pay | Admitting: Vascular Surgery

## 2022-01-25 VITALS — BP 140/97 | HR 59 | Temp 98.0°F | Resp 16 | Ht 66.0 in | Wt 215.0 lb

## 2022-01-25 DIAGNOSIS — K551 Chronic vascular disorders of intestine: Secondary | ICD-10-CM | POA: Diagnosis not present

## 2022-01-25 DIAGNOSIS — I771 Stricture of artery: Secondary | ICD-10-CM | POA: Diagnosis not present

## 2022-01-25 NOTE — Progress Notes (Signed)
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  Patient has a history of tobacco abuse and quit smoking recently.  She noted her blood pressure skyrocketed into the 397Q systolic.  She had a renal artery duplex ordered on 01/11/2022 with findings of 1 to 59% right renal artery stenosis and no left renal artery stenosis.  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  She endorses postprandial abdominal pain for the last 2 years.  She states this happens about 5-6 times a week after she eats.  Generalized pain.  She tries to eat smaller meals.  She has actually gained some weight because of her smoking cessation and trying to avoid cigarettes.  States nobody has been able to determine the cause of her postprandial abdominal pain in the past.  Past Medical History:  Diagnosis Date   ADD (attention deficit disorder) 06/14/2012   Anxiety    Bronchitis    Complication of anesthesia 1987   states woke up during appedentomy   Depression 06/14/2012   Hepatic steatosis 11/13/2018   Hepatic steatosis with hepatomegaly seen on MRI abd on 11/2018   History of leukocytosis 06/14/2012   Reports "WBC 32" decades ago with negative hematology specialist workup.    Hyperlipidemia 06/14/2012   Hypertension    Seasonal allergies 06/14/2012    Past Surgical History:  Procedure Laterality Date   APPENDECTOMY     BICEPT TENODESIS Right 10/10/2019   Procedure: BICEPS TENODESIS;  Surgeon: Hiram Gash, MD;  Location: [facility];  Service: Orthopedics;  Laterality: Right;   bladder sling/mesh     INGUINAL HERNIA REPAIR     intrauterine ablation     LEFT HEART CATH AND CORONARY  ANGIOGRAPHY N/A 12/09/2021   Procedure: LEFT HEART CATH AND CORONARY ANGIOGRAPHY;  Surgeon: Belva Crome, MD;  Location: [facility];  Service: Cardiovascular;  Laterality: N/A;   NASAL SINUS SURGERY     TUBAL LIGATION      Family History  Problem Relation Age of Onset   Depression Mother    Diabetes Father    Hyperlipidemia Father    Hypertension Father    Depression Sister    Breast cancer Maternal Grandmother    Depression Son    Sleep apnea Neg Hx     SOCIAL HISTORY: Social History   Socioeconomic History   Marital status: Married    Spouse name: Not on file   Number of children: Not on file   Years of education: Not on file   Highest education level: Not on file  Occupational History   Not on file  Tobacco Use   Smoking status: Every Day    Packs/day: 0.25    Types: Cigarettes   Smokeless tobacco: Never   Tobacco comments:    30 years , currently on Chantix  Vaping Use   Vaping Use: Never used  Substance and Sexual Activity   Alcohol use: Not Currently   Drug use: No   Sexual activity: Not on file  Other Topics Concern   Not on file  Social History Narrative   Not on file   Social Determinants of Health   Financial Resource Strain: Not  on file  Food Insecurity: Not on file  Transportation Needs: Not on file  Physical Activity: Not on file  Stress: Not on file  Social Connections: Not on file  Intimate Partner Violence: Not on file    Allergies  Allergen Reactions   Macrobid [Nitrofurantoin] Hives and Rash   Other Hives    Preservative used in vaccinations   Tetanus Toxoids Swelling    Pt states she was told the Tdap injection would have to be administered in the hospital  After discussion on 11/10 pt remembers being told it was thimersol in the tetanus 30+years ago that caused the swelling.     Thimerosal (Thiomersal) Rash    Current Outpatient Medications  Medication Sig Dispense Refill   amLODipine (NORVASC) 5 MG tablet Take 1  tablet (5 mg total) by mouth daily. 90 tablet 3   amphetamine-dextroamphetamine (ADDERALL) 10 MG tablet Take 1 tablet (10 mg total) by mouth daily with breakfast. (Patient taking differently: Take 10 mg by mouth daily with breakfast. Taking PRN) 30 tablet 0   aspirin EC 81 MG tablet Take 1 tablet (81 mg total) by mouth daily. 90 tablet 3   cetirizine (ZYRTEC) 10 MG tablet Take 20 mg by mouth daily.     clonazePAM (KLONOPIN) 0.5 MG tablet TAKE 1 TABLET(0.5 MG) BY MOUTH THREE TIMES DAILY AS NEEDED FOR ANXIETY 30 tablet 3   enalapril (VASOTEC) 10 MG tablet Take 1 tablet (10 mg total) by mouth 2 (two) times daily. 60 tablet 1   escitalopram (LEXAPRO) 20 MG tablet Take 2 tablets (40 mg total) by mouth daily. 180 tablet 3   gabapentin (NEURONTIN) 300 MG capsule Take 1 capsule (300 mg total) by mouth 3 (three) times daily. 270 capsule 3   meclizine (ANTIVERT) 25 MG tablet Take 1 tablet (25 mg total) by mouth 3 (three) times daily as needed for dizziness. 30 tablet 0   nitroGLYCERIN (NITROSTAT) 0.4 MG SL tablet Place 1 tablet (0.4 mg total) under the tongue every 5 (five) minutes as needed for chest pain. 25 tablet 3   omeprazole (PRILOSEC) 40 MG capsule Take 1 capsule (40 mg total) by mouth in the morning and at bedtime. 60 capsule 3   rosuvastatin (CRESTOR) 20 MG tablet Take 1 tablet (20 mg total) by mouth daily. 90 tablet 3   cloNIDine (CATAPRES) 0.1 MG tablet Take 1 tablet (0.1 mg total) by mouth 3 (three) times daily as needed (systolic blood pressure >462). (Patient not taking: Reported on 12/30/2021) 90 tablet 0   No current facility-administered medications for this visit.    REVIEW OF SYSTEMS:  '[X]'$  denotes positive finding, '[ ]'$  denotes negative finding Cardiac  Comments:  Chest pain or chest pressure:    Shortness of breath upon exertion:    Short of breath when lying flat:    Irregular heart rhythm:        Vascular    Pain in calf, thigh, or hip brought on by ambulation:    Pain in feet  at night that wakes you up from your sleep:     Blood clot in your veins:    Leg swelling:         Pulmonary    Oxygen at home:    Productive cough:     Wheezing:         Neurologic    Sudden weakness in arms or legs:     Sudden numbness in arms or legs:     Sudden onset of  difficulty speaking or slurred speech:    Temporary loss of vision in one eye:     Problems with dizziness:         Gastrointestinal    Blood in stool:     Vomited blood:         Genitourinary    Burning when urinating:     Blood in urine:        Psychiatric    Major depression:         Hematologic    Bleeding problems:    Problems with blood clotting too easily:        Skin    Rashes or ulcers:        Constitutional    Fever or chills:      PHYSICAL EXAM: Vitals:   01/25/22 0901  BP: (!) 140/97  Pulse: (!) 59  Resp: 16  Temp: 98 F (36.7 C)  TempSrc: Temporal  SpO2: 96%  Weight: 215 lb (97.5 kg)  Height: '5\' 6"'$  (1.676 m)    GENERAL: The patient is a well-nourished female, in no acute distress. The vital signs are documented above. CARDIAC: There is a regular rate and rhythm.  VASCULAR:  Palpable femoral pulses bilaterally Palpable PT pulses bilaterally PULMONARY: No respiratory distress. ABDOMEN: Soft and non-tender. MUSCULOSKELETAL: There are no major deformities or cyanosis. NEUROLOGIC: No focal weakness or paresthesias are detected. SKIN: There are no ulcers or rashes noted. PSYCHIATRIC: The patient has a normal affect.  DATA:   ABDOMINAL VISCERAL  Patient Name:  SHAYNNA HUSBY [facility]  Date of Exam:   01/11/2022 Medical Rec #: [id number]            Accession #:    [id number] Date of Birth: 12-12-1970           Patient Gender: F Patient Age:   7 years Exam Location:  [facility] Procedure:      VAS US RENAL ARTERY DUPLEX Referring Phys:   --------------------------------------------------------------------------- -----  Indications: Secondary hypertension. Patient  denies any abdominal pain.  High Risk Factors: Hypertension, hyperlipidemia, past history of smoking.                    Patient reports she has quit smoking x 3 weeks.  Limitations: Air/bowel gas and obesity. Comparison Study: NA  Performing Technologist: Sharlett Iles RVT    Examination Guidelines: A complete evaluation includes B-mode imaging, spectral Doppler, color Doppler, and power Doppler as needed of all accessible portions of each vessel. Bilateral testing is considered an integral part of a complete examination. Limited examinations for reoccurring indications may be performed as noted.    Duplex Findings: +----------------------+--------+--------+------+----------------------+ Mesenteric            PSV cm/sEDV cm/sPlaque       Comments        +----------------------+--------+--------+------+----------------------+ Aorta Prox               97                 2.2 cm AP x 2.2 cm TRV +----------------------+--------+--------+------+----------------------+ Aorta Distal             73                                        +----------------------+--------+--------+------+----------------------+ Celiac Artery Origin    216                                        +----------------------+--------+--------+------+----------------------+  Celiac Artery Proximal  461     115                                +----------------------+--------+--------+------+----------------------+ SMA Origin              299      38                                +----------------------+--------+--------+------+----------------------+ SMA Proximal            264      26                                +----------------------+--------+--------+------+----------------------+ IMA                     196      26                                +----------------------+--------+--------+------+----------------------+           +------------------+--------+--------+-------+ Right Renal ArteryPSV cm/sEDV cm/sComment +------------------+--------+--------+-------+ Origin              250      67           +------------------+--------+--------+-------+ Proximal            273      59           +------------------+--------+--------+-------+ Mid                 154      30           +------------------+--------+--------+-------+ Distal               75      18           +------------------+--------+--------+-------+  +-----------------+--------+--------+-------+ Left Renal ArteryPSV cm/sEDV cm/sComment +-----------------+--------+--------+-------+ Origin             114      37           +-----------------+--------+--------+-------+ Proximal           122      36           +-----------------+--------+--------+-------+ Mid                119      35           +-----------------+--------+--------+-------+ Distal              83      28           +-----------------+--------+--------+-------+  +------------+--------+--------+----+-----------+--------+--------+----+ Right KidneyPSV cm/sEDV cm/sRI  Left KidneyPSV cm/sEDV cm/sRI   +------------+--------+--------+----+-----------+--------+--------+----+ Upper Pole  34      11      0.68Upper Pole 30      11      0.63 +------------+--------+--------+----+-----------+--------+--------+----+ Mid         34      10      0.70Mid        29      10      0.66 +------------+--------+--------+----+-----------+--------+--------+----+ Lower Pole  19      6       0.68Lower Pole 41  13      0.68 +------------+--------+--------+----+-----------+--------+--------+----+ Hilar       49      15      0.69Hilar      69      22      0.68 +------------+--------+--------+----+-----------+--------+--------+----+  +------------------+------+------------------+------+ Right Kidney             Left Kidney              +------------------+------+------------------+------+ RAR                     RAR                      +------------------+------+------------------+------+ RAR (manual)      2.81  RAR (manual)      1.26   +------------------+------+------------------+------+ Cortex            .72 cmCortex            1.0 cm +------------------+------+------------------+------+ Cortex thickness        Corex thickness          +------------------+------+------------------+------+ Kidney length (cm)11.94 Kidney length (cm)10.04  +------------------+------+------------------+------+    Summary: Largest Aortic Diameter: 2.2 cm   Renal:   Right: Normal size right kidney. Normal right Resistive Index.        Normal cortical thickness of right kidney. 1-59% stenosis of        the right renal artery. RRV flow present. Left:  Normal size of left kidney. Normal left Resistive Index.        Normal cortical thickness of the left kidney. No evidence of        left renal artery stenosis. LRV flow present. Mesenteric: 70 to 99% stenosis in the celiac artery and superior mesenteric artery.   Patent IVC.   *See table(s) above for measurements and observations.    Assessment/Plan:  51 year old female with history of hyperlipidemia and previous tobacco abuse that had a renal artery duplex on 01/11/2022 with no evidence of high-grade renal artery stenosis to explain her hypertension.  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  I discussed etiology of chronic mesenteric ischemia.  I discussed with these incidental findings on ultrasound there would be no indication for intervention without symptoms.  However, she endorses postprandial abdominal pain for the last several years.  She has read about chronic mesenteric ischemia and feels that this explains her symptoms.  I have offered her a mesenteric arteriogram in the Cath  Lab.  Discussed that it is unusual that she has gained weight given the concern for chronic mesenteric ischemia.  Discussed if we do find anything high-grade we may evaluate for stent placement.  We will get this scheduled on 02/24/2022.  Risk and benefits discussed.  Discussed this being done as an outpatient.   Marty Heck, MD [facility] Office: [phone]

## 2022-01-25 NOTE — Telephone Encounter (Signed)
HST- [employer] no auth req.  Patient is scheduled at [facility] for 02/22/22 at 8 AM.  Mailed packet to the patient.

## 2022-01-29 NOTE — Progress Notes (Deleted)
Cardiology Office Note:    Date:  01/29/2022   ID:  Sarah Nicholson, DOB [birth year], MRN [id number]  PCP:  Silverio Decamp, MD   [place] Providers Cardiologist:  None {  Referring MD: Silverio Decamp,*    History of Present Illness:    Sarah Nicholson is a 51 y.o. female with a hx of ADD, anxiety, depression, HTN, and HLD who was referred by Dr. Dianah Field for further evaluation of chest pain.   Patient was seen on 11/03/21 by Dr. Dianah Field where she was having episodes of chest pain and dizziness. ETT 11/30/21 showed that the patient exercised 65mn 4sec achieving 7.1METs. Had baseline ST depressions but they became more significant STD with frequent PVCs concerning for ischemia. She is now referred to Cardiology for further management.  Today, the patient states that in the past 2 years she has been having dizziness episodes while at work. 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 While symptoms mainly occur with standing, she does note that it can happen with sitting. At the time of her dizzy episodes, her blood pressure has been as high as 204/110 and as low as 126/68 with no documented low blood pressures during her events. She has the feeling of presyncope but has not passed out. Symptoms can last anywhere from a couple of minutes to several hours. Does not improve with sitting down. States she remains hydrated and does not skip meals. Episodes do not correlate with when she takes her clonidine. During her most recent episode she also had chest pain and she called EMS. She took herself to the ED. There, ECG nonischemic. Trop negative x2. CXR without acute pathology. She was recommended for continued outpatient follow-up.  Today, the patient describes other intermittent episodes of chest discomfort/tightness. At one of the rest areas last night, she walked up stairs and became short winded with chest pain.  This chest tightness has been more frequent especially with exertion. Initially, she had felt her chest tightness was attributable to anxiety. She has tried taking clonazepam with no improvement.  Reports occasional LE edema and some SOB at night but this is not consistent.   At this time she smokes about 1/2 ppd and is trying to quit smoking.  Recently she was started on rosuvastatin. Her triglycerides were 360.  In her family, her father has dilated cardiomyopathy and atrial fibrillation.  She denies any palpitations, or PND.   Past Medical History:  Diagnosis Date   ADD (attention deficit disorder) 06/14/2012   Anxiety    Bronchitis    Complication of anesthesia 1987   states woke up during appedentomy   Depression 06/14/2012   Hepatic steatosis 11/13/2018   Hepatic steatosis with hepatomegaly seen on MRI abd on 11/2018   History of leukocytosis 06/14/2012   Reports "WBC 32" decades ago with negative hematology specialist workup.    Hyperlipidemia 06/14/2012   Hypertension    Seasonal allergies 06/14/2012    Past Surgical History:  Procedure Laterality Date   APPENDECTOMY     BICEPT TENODESIS Right 10/10/2019   Procedure: BICEPS TENODESIS;  Surgeon: VHiram Gash MD;  Location: [facility]  Service: Orthopedics;  Laterality: Right;   bladder sling/mesh     INGUINAL HERNIA REPAIR     intrauterine ablation     LEFT HEART CATH AND CORONARY ANGIOGRAPHY N/A 12/09/2021   Procedure: LEFT HEART CATH AND CORONARY ANGIOGRAPHY;  Surgeon:  Belva Crome, MD;  Location: [facility];  Service: Cardiovascular;  Laterality: N/A;   NASAL SINUS SURGERY     TUBAL LIGATION      Current Medications: No outpatient medications have been marked as taking for the 01/31/22 encounter (Appointment) with Freada Bergeron, MD.     Allergies:   Macrobid [nitrofurantoin], Other, Tetanus toxoids, and Thimerosal (thiomersal)   Social History   Socioeconomic History    Marital status: Married    Spouse name: Not on file   Number of children: Not on file   Years of education: Not on file   Highest education level: Not on file  Occupational History   Not on file  Tobacco Use   Smoking status: Every Day    Packs/day: 0.25    Types: Cigarettes   Smokeless tobacco: Never   Tobacco comments:    30 years , currently on Chantix  Vaping Use   Vaping Use: Never used  Substance and Sexual Activity   Alcohol use: Not Currently   Drug use: No   Sexual activity: Not on file  Other Topics Concern   Not on file  Social History Narrative   Not on file   Social Determinants of Health   Financial Resource Strain: Not on file  Food Insecurity: Not on file  Transportation Needs: Not on file  Physical Activity: Not on file  Stress: Not on file  Social Connections: Not on file     Family History: The patient's family history includes Breast cancer in her maternal grandmother; Depression in her mother, sister, and son; Diabetes in her father; Hyperlipidemia in her father; Hypertension in her father. There is no history of Sleep apnea.  ROS:   Review of Systems  Constitutional:  Negative for chills and fever.  HENT:  Negative for nosebleeds and tinnitus.   Eyes:  Negative for blurred vision and pain.  Respiratory:  Positive for shortness of breath. Negative for cough, hemoptysis and stridor.   Cardiovascular:  Positive for chest pain, orthopnea and leg swelling. Negative for palpitations, claudication and PND.  Gastrointestinal:  Negative for blood in stool, diarrhea, nausea and vomiting.  Genitourinary:  Negative for dysuria and hematuria.  Musculoskeletal:  Negative for falls.  Neurological:  Positive for dizziness and headaches (Ocular migraines).  Psychiatric/Behavioral:  Negative for depression, hallucinations and substance abuse. The patient does not have insomnia.      EKGs/Labs/Other Studies Reviewed:    The following studies were reviewed  today:  ETT 11/30/21:   Stress ECG interpretation confounded by resting ST-T wave abnormalities. However, there is worsening of ST segment depressions with stress, horizontal depressions, and frequent PVCs with stress. These findings suggests Stress ECG is abnormal and positive for ischemia. Consider imaging guided stress testing for further evaluation.   A Bruce protocol stress test was performed. Exercise capacity was normal. Patient exercised for 6 min and 4 sec. Maximum HR of 153 bpm. MPHR 91.0 %. Peak METS 7.1 . The patient experienced no angina during the test. The patient requested the test to be stopped. The patient reported dyspnea and dizziness during the stress test. Patient also experienced neck pain with stress. Hypertensive blood pressure response to exercise (219/78 mmHg). Normal heart rate response noted during stress. Heart rate recovery was normal.   2.0 mm of horizontal ST depression (II, III, aVF, V4, V5 and V6) was noted. Arrhythmias during stress: frequent PVCs. Arrhythmias during recovery: frequent PVCs. ECG was interpretable and non-conclusive.   Prior  study not available for comparison.  EKG:  EKG is personally reviewed. 12/06/2021:  Sinus rhythm. Nonspecific T wave abnormalities.  10/29/2021 (ED):   Normal sinus rhythm at 62 bpm. T wave abnormality, consider anterior ischemia.  Recent Labs: 11/03/2021: ALT 12; Brain Natriuretic Peptide 6; TSH 1.69 12/06/2021: BUN 13; Creatinine, Ser 0.93; Hemoglobin 14.4; Platelets 332; Potassium 4.3; Sodium 141   Recent Lipid Panel    Component Value Date/Time   CHOL 223 (H) 11/03/2021 0000   TRIG 360 (H) 11/03/2021 0000   HDL 36 (L) 11/03/2021 0000   CHOLHDL 6.2 (H) 11/03/2021 0000   VLDL 63 (H) 11/11/2016 0951   LDLCALC 136 (H) 11/03/2021 0000     Risk Assessment/Calculations:      No BP recorded.  {Refresh Note OR Click here to enter BP  :1}***         Physical Exam:    VS:  LMP  (LMP Unknown)     Wt Readings from Last  3 Encounters:  01/25/22 215 lb (97.5 kg)  01/20/22 215 lb (97.5 kg)  12/30/21 213 lb (96.6 kg)     GEN: Well nourished, well developed in no acute distress HEENT: Normal NECK: No JVD; No carotid bruits CARDIAC: RRR, no murmurs, rubs, gallops RESPIRATORY:  Clear to auscultation without rales, wheezing or rhonchi  ABDOMEN: Soft, non-tender, non-distended MUSCULOSKELETAL:  No edema; No deformity  SKIN: Warm and dry NEUROLOGIC:  Alert and oriented x 3 PSYCHIATRIC:  Normal affect   ASSESSMENT:    No diagnosis found.  PLAN:    In order of problems listed above:  #Chest Pain: #Positive Stress Test: Patient with progressive episodes of exertional chest pain found to have worsening ST depressions on ETT and more frequent PVCs during exercise. Findings concerning for possible ischemia. Given symptoms, risk factors and positive stress test, will plan for cath for further evaluation.  -Plan for cath for further evaluation -Check TTE -Continue ASA '81mg'$  daily -Continue crestor '20mg'$  daily  #Dizziness: Patient has had a 2 year history of intermittent episodes of dizziness that mainly occur with standing but can occur with sitting as well. Symptoms can last for several minutes to several hours and nothing seems to make them better. Not associated with low blood pressure or position changes. Recent cardiac monitor reviewed by me and reveals 1 run of nonsustained VT lasting 4 beats with rare PVCs and SVE. Patient triggered events correlate with VE. Will check TTE for further evaluation and pursue ischemic work-up as above.  -Ischemic work-up as above -Check TTE -Zio personally reviewed with no significant arrhythmias, pauses or ectopy  #HTN: Blood pressure better controlled on exam today. Will continue to monitor at home -Continue enalapril '10mg'$  daily -Continue HCTZ 12.'5mg'$  daily  #HLD: TC 223, HDL 36, TG 360, LDL 136. Just started on crestor by PCP. -Continue crestor '20mg'$  daily -Repeat  lipids in 6-8weeks  #Smoking: -Encouraged cessation      Shared Decision Making/Informed Consent The risks [stroke (1 in 1000), death (1 in 1000), kidney failure [usually temporary] (1 in 500), bleeding (1 in 200), allergic reaction [possibly serious] (1 in 200)], benefits (diagnostic support and management of coronary artery disease) and alternatives of a cardiac catheterization were discussed in detail with Sarah Nicholson and she is willing to proceed.  Follow-up:  6 months.  Medication Adjustments/Labs and Tests Ordered: Current medicines are reviewed at length with the patient today.  Concerns regarding medicines are outlined above.   No orders of the defined types were  placed in this encounter.  No orders of the defined types were placed in this encounter.  There are no Patient Instructions on file for this visit.    I,Mathew Stumpf,acting as a Education administrator for Freada Bergeron, MD.,have documented all relevant documentation on the behalf of Freada Bergeron, MD,as directed by  Freada Bergeron, MD while in the presence of Freada Bergeron, MD.  I, Freada Bergeron, MD, have reviewed all documentation for this visit. The documentation on 01/29/22 for the exam, diagnosis, procedures, and orders are all accurate and complete.   Signed, Freada Bergeron, MD  01/29/2022 9:03 PM    Sarah Nicholson

## 2022-01-31 ENCOUNTER — Encounter: Payer: Self-pay | Admitting: Cardiology

## 2022-01-31 ENCOUNTER — Ambulatory Visit: Payer: 59 | Attending: Cardiology | Admitting: Cardiology

## 2022-01-31 VITALS — BP 153/90 | HR 71 | Ht 66.0 in | Wt 219.0 lb

## 2022-01-31 DIAGNOSIS — I2089 Other forms of angina pectoris: Secondary | ICD-10-CM | POA: Diagnosis not present

## 2022-01-31 DIAGNOSIS — E7800 Pure hypercholesterolemia, unspecified: Secondary | ICD-10-CM

## 2022-01-31 DIAGNOSIS — R079 Chest pain, unspecified: Secondary | ICD-10-CM

## 2022-01-31 DIAGNOSIS — I10 Essential (primary) hypertension: Secondary | ICD-10-CM

## 2022-01-31 DIAGNOSIS — E782 Mixed hyperlipidemia: Secondary | ICD-10-CM

## 2022-01-31 DIAGNOSIS — I2510 Atherosclerotic heart disease of native coronary artery without angina pectoris: Secondary | ICD-10-CM

## 2022-01-31 DIAGNOSIS — Z720 Tobacco use: Secondary | ICD-10-CM

## 2022-01-31 DIAGNOSIS — Z79899 Other long term (current) drug therapy: Secondary | ICD-10-CM

## 2022-01-31 MED ORDER — CLONIDINE HCL 0.1 MG PO TABS: 0.1000 mg | ORAL_TABLET | Freq: Three times a day (TID) | ORAL | 1 refills | Status: DC | PRN

## 2022-01-31 MED ORDER — ISOSORBIDE MONONITRATE ER 30 MG PO TB24: 30.0000 mg | ORAL_TABLET | Freq: Every day | ORAL | 2 refills | Status: DC

## 2022-01-31 MED ORDER — NICOTINE 21 MG/24HR TD PT24: 21.0000 mg | MEDICATED_PATCH | Freq: Every day | TRANSDERMAL | 3 refills | Status: DC

## 2022-01-31 NOTE — Patient Instructions (Signed)
Medication Instructions:   STOP TAKING CLONIDINE NOW  START TAKING ISOSORBIDE MONONITRATE (IMDUR) 30 MG BY MOUTH DAILY  WE SENT IN THE 21 MG NICOTINE PATCH TO YOUR PHARMACY--PLACE ONE PATCH ONTO THE SKIN DAILY  *If you need a refill on your cardiac medications before your next appointment, please call your pharmacy*   Lab Work:  TOMORROW 02/01/22 IN THE OFFICE--CHECK LIPIDS--PLEASE COME FASTING TO THIS LAB APPOINTMENT  If you have labs (blood work) drawn today and your tests are completely normal, you will receive your results only by: [place] (if you have MyChart) OR A paper copy in the mail If you have any lab test that is abnormal or we need to change your treatment, we will call you to review the results.    Follow-Up:  3 MONTHS WITH AN EXTENDER IN THE OFFICE PER DR. PEMBERTON--PLEASE SCHEDULE WITH AN EXTENDER      Important Information About Sugar

## 2022-01-31 NOTE — Progress Notes (Signed)
Cardiology Office Note:    Date:  01/31/2022   ID:  Sarah Nicholson, DOB 06/18/1970, MRN [id number]  PCP:  Silverio Decamp, MD   [facility] Providers Cardiologist:  None {  Referring MD: Silverio Decamp,*    History of Present Illness:    Sarah Nicholson is a 51 y.o. female with a hx of ADD, anxiety, depression, HTN, and HLD who presents to clinic for follow-up.  Patient was seen on 11/03/21 by Dr. Dianah Field where she was having episodes of chest pain and dizziness. ETT 11/30/21 showed that the patient exercised 85mn 4sec achieving 7.1METs. Had baseline ST depressions but they became more significant STD with frequent PVCs concerning for ischemia. She was subsequently referred to Cardiology for further management.  She was initially seen in clinic on 11/2021 for her chest pain and positive stress test. She ultimately underwent LHC on 12/08/21 which revealed nonobstructive CAD with concern for possible microvascular angina.   She then presented to ED at [facility] on 12/21/2021 for complaint of chest pain.  Patient endorsed intermittent chest pain that radiated into her left arm.  She reported pain was relieved with nitroglycerin and denied any shortness of breath.  EKG was completed with no evidence of acute ischemia but did note nonspecific T wave changes. Chest x-ray was normal and labs were notable for mild leukocytosis. Troponins were negative and patient was discharged following second trending troponin.   Was last seen by [facility] 12/30/21 where she continued to have intermittent chest pressure. BP was better controlled.  Today, the patient states that she continues to experience chest discomfort, about once a week. She has used Nitro 4 times since receiving it. Last Thursday, she used it twice; her symptoms improved after the second nitro. Her chest pain typically occurs in the afternoon, though she had one episode in the  evening while resting and watching television. Sometimes she does experience associated SOB.  In the past 2 years her blood pressure has become more difficult to control. She states her BP has been "all over the place" even with her new medications. She presents a photo of her BP results on her phone.  Her BP has been as low as 100/60 and as high as 160/110. Her BP is elevated today (153/90). She states that her BP readings are not necessarily correlated with the timing of her antihypertensives. Typically she takes Aspirin, rosuvastatin, and enalapril at night. In the morning she takes amlodipine and enalapril. She denies dizziness or lightheadedness.  She experienced some edema last week, which has resolved today. She says she feels the edema in her RLE more than she sees it.  At this time she has successfully quit smoking. She is using 21 mg nicotine patches.  She hasn't taken adderall since September because she read they can cause heart issues. She is taking them as needed.  She denies any palpitations. No lightheadedness, syncope, orthopnea, or PND.   Past Medical History:  Diagnosis Date   ADD (attention deficit disorder) 06/14/2012   Anxiety    Bronchitis    Complication of anesthesia 1987   states woke up during appedentomy   Depression 06/14/2012   Hepatic steatosis 11/13/2018   Hepatic steatosis with hepatomegaly seen on MRI abd on 11/2018   History of leukocytosis 06/14/2012   Reports "WBC 32" decades ago with negative hematology specialist workup.    Hyperlipidemia 06/14/2012   Hypertension    Seasonal allergies 06/14/2012  Past Surgical History:  Procedure Laterality Date   APPENDECTOMY     BICEPT TENODESIS Right 10/10/2019   Procedure: BICEPS TENODESIS;  Surgeon: Hiram Gash, MD;  Location: [facility];  Service: Orthopedics;  Laterality: Right;   bladder sling/mesh     INGUINAL HERNIA REPAIR     intrauterine ablation     LEFT HEART CATH AND CORONARY  ANGIOGRAPHY N/A 12/09/2021   Procedure: LEFT HEART CATH AND CORONARY ANGIOGRAPHY;  Surgeon: Belva Crome, MD;  Location: [facility];  Service: Cardiovascular;  Laterality: N/A;   NASAL SINUS SURGERY     TUBAL LIGATION      Current Medications: Current Meds  Medication Sig   amLODipine (NORVASC) 5 MG tablet Take 1 tablet (5 mg total) by mouth daily.   amphetamine-dextroamphetamine (ADDERALL) 10 MG tablet Take 1 tablet (10 mg total) by mouth daily with breakfast. (Patient taking differently: Take 10 mg by mouth daily with breakfast. Taking PRN)   aspirin EC 81 MG tablet Take 1 tablet (81 mg total) by mouth daily.   cetirizine (ZYRTEC) 10 MG tablet Take 20 mg by mouth daily.   clonazePAM (KLONOPIN) 0.5 MG tablet TAKE 1 TABLET(0.5 MG) BY MOUTH THREE TIMES DAILY AS NEEDED FOR ANXIETY   enalapril (VASOTEC) 10 MG tablet Take 1 tablet (10 mg total) by mouth 2 (two) times daily.   escitalopram (LEXAPRO) 20 MG tablet Take 2 tablets (40 mg total) by mouth daily.   gabapentin (NEURONTIN) 300 MG capsule Take 1 capsule (300 mg total) by mouth 3 (three) times daily.   isosorbide mononitrate (IMDUR) 30 MG 24 hr tablet Take 1 tablet (30 mg total) by mouth daily.   meclizine (ANTIVERT) 25 MG tablet Take 1 tablet (25 mg total) by mouth 3 (three) times daily as needed for dizziness.   nicotine (NICODERM CQ - DOSED IN MG/24 HOURS) 21 mg/24hr patch Place 1 patch (21 mg total) onto the skin daily.   nitroGLYCERIN (NITROSTAT) 0.4 MG SL tablet Place 1 tablet (0.4 mg total) under the tongue every 5 (five) minutes as needed for chest pain.   omeprazole (PRILOSEC) 40 MG capsule Take 1 capsule (40 mg total) by mouth in the morning and at bedtime.   rosuvastatin (CRESTOR) 20 MG tablet Take 1 tablet (20 mg total) by mouth daily.   [DISCONTINUED] cloNIDine (CATAPRES) 0.1 MG tablet Take 1 tablet (0.1 mg total) by mouth 3 (three) times daily as needed (systolic blood pressure >086).     Allergies:   Macrobid  [nitrofurantoin], Other, Tetanus toxoids, and Thimerosal (thiomersal)   Social History   Socioeconomic History   Marital status: Married    Spouse name: Not on file   Number of children: Not on file   Years of education: Not on file   Highest education level: Not on file  Occupational History   Not on file  Tobacco Use   Smoking status: Every Day    Packs/day: 0.25    Types: Cigarettes   Smokeless tobacco: Never   Tobacco comments:    30 years , currently on Chantix  Vaping Use   Vaping Use: Never used  Substance and Sexual Activity   Alcohol use: Not Currently   Drug use: No   Sexual activity: Not on file  Other Topics Concern   Not on file  Social History Narrative   Not on file   Social Determinants of Health   Financial Resource Strain: Not on file  Food Insecurity: Not on  file  Transportation Needs: Not on file  Physical Activity: Not on file  Stress: Not on file  Social Connections: Not on file     Family History: The patient's family history includes Breast cancer in her maternal grandmother; Depression in her mother, sister, and son; Diabetes in her father; Hyperlipidemia in her father; Hypertension in her father. There is no history of Sleep apnea.  ROS:   Review of Systems  Constitutional:  Negative for chills and fever.  HENT:  Negative for nosebleeds and tinnitus.   Eyes:  Negative for blurred vision and pain.  Respiratory:  Positive for cough and shortness of breath. Negative for hemoptysis and stridor.   Cardiovascular:  Positive for chest pain and leg swelling. Negative for palpitations, claudication and PND.  Gastrointestinal:  Negative for blood in stool, diarrhea, nausea and vomiting.  Genitourinary:  Negative for dysuria and hematuria.  Musculoskeletal:  Negative for falls.  Neurological:  Negative for dizziness and focal weakness.  Psychiatric/Behavioral:  Negative for depression, hallucinations and substance abuse. The patient does not have  insomnia.      EKGs/Labs/Other Studies Reviewed:    The following studies were reviewed today:  ECHO 12/31/2021: IMPRESSIONS     1. Left ventricular ejection fraction, by estimation, is 60 to 65%. The  left ventricle has normal function. The left ventricle has no regional  wall motion abnormalities. Left ventricular diastolic parameters were  normal. The average left ventricular  global longitudinal strain is -20.0 %. The global longitudinal strain is  normal.   2. Right ventricular systolic function is normal. The right ventricular  size is normal.   3. The mitral valve is normal in structure. Trivial mitral valve  regurgitation. No evidence of mitral stenosis.   4. The aortic valve has an indeterminant number of cusps. Aortic valve  regurgitation is trivial. Aortic valve sclerosis is present, with no  evidence of aortic valve stenosis.   5. The inferior vena cava is normal in size with greater than 50%  respiratory variability, suggesting right atrial pressure of 3 mmHg.   Comparison(s): No prior Echocardiogram.    TTE 12/31/21:  IMPRESSIONS     1. Left ventricular ejection fraction, by estimation, is 60 to 65%. The  left ventricle has normal function. The left ventricle has no regional  wall motion abnormalities. Left ventricular diastolic parameters were  normal. The average left ventricular  global longitudinal strain is -20.0 %. The global longitudinal strain is  normal.   2. Right ventricular systolic function is normal. The right ventricular  size is normal.   3. The mitral valve is normal in structure. Trivial mitral valve  regurgitation. No evidence of mitral stenosis.   4. The aortic valve has an indeterminant number of cusps. Aortic valve  regurgitation is trivial. Aortic valve sclerosis is present, with no  evidence of aortic valve stenosis.   5. The inferior vena cava is normal in size with greater than 50%  respiratory variability, suggesting right  atrial pressure of 3 mmHg.   Comparison(s): No prior Echocardiogram.   Renal ultrasound 12/2021: ABDOMINAL VISCERAL  Patient Name:  Sarah Nicholson [facility]  Date of Exam:   01/11/2022 Medical Rec #: [id number]            Accession #:    [id number] Date of Birth: [birth year]           Patient Gender: F Patient Age:   66 years Exam Location:  [facility] Procedure:      VAS US  RENAL ARTERY DUPLEX Referring Phys:   --------------------------------------------------------------------------- -----  Indications: Secondary hypertension. Patient denies any abdominal pain.  High Risk Factors: Hypertension, hyperlipidemia, past history of smoking.                    Patient reports she has quit smoking x 3 weeks.  Limitations: Air/bowel gas and obesity. Comparison Study: NA  Performing Technologist: Sharlett Iles RVT    Examination Guidelines: A complete evaluation includes B-mode imaging, spectral Doppler, color Doppler, and power Doppler as needed of all accessible portions of each vessel. Bilateral testing is considered an integral part of a complete examination. Limited examinations for reoccurring indications may be performed as noted.    Duplex Findings: +----------------------+--------+--------+------+----------------------+ Mesenteric            PSV cm/sEDV cm/sPlaque       Comments        +----------------------+--------+--------+------+----------------------+ Aorta Prox               97                 2.2 cm AP x 2.2 cm TRV +----------------------+--------+--------+------+----------------------+ Aorta Distal             73                                        +----------------------+--------+--------+------+----------------------+ Celiac Artery Origin    216                                        +----------------------+--------+--------+------+----------------------+ Celiac Artery Proximal  461     115                                 +----------------------+--------+--------+------+----------------------+ SMA Origin              299      38                                +----------------------+--------+--------+------+----------------------+ SMA Proximal            264      26                                +----------------------+--------+--------+------+----------------------+ IMA                     196      26                                +----------------------+--------+--------+------+----------------------+          +------------------+--------+--------+-------+ Right Renal ArteryPSV cm/sEDV cm/sComment +------------------+--------+--------+-------+ Origin              250      67           +------------------+--------+--------+-------+ Proximal            273      59           +------------------+--------+--------+-------+ Mid  154      30           +------------------+--------+--------+-------+ Distal               75      18           +------------------+--------+--------+-------+  +-----------------+--------+--------+-------+ Left Renal ArteryPSV cm/sEDV cm/sComment +-----------------+--------+--------+-------+ Origin             114      37           +-----------------+--------+--------+-------+ Proximal           122      36           +-----------------+--------+--------+-------+ Mid                119      35           +-----------------+--------+--------+-------+ Distal              83      28           +-----------------+--------+--------+-------+  +------------+--------+--------+----+-----------+--------+--------+----+ Right KidneyPSV cm/sEDV cm/sRI  Left KidneyPSV cm/sEDV cm/sRI   +------------+--------+--------+----+-----------+--------+--------+----+ Upper Pole  34      11      0.68Upper Pole 30      11       0.63 +------------+--------+--------+----+-----------+--------+--------+----+ Mid         34      10      0.70Mid        29      10      0.66 +------------+--------+--------+----+-----------+--------+--------+----+ Lower Pole  19      6       0.68Lower Pole 41      13      0.68 +------------+--------+--------+----+-----------+--------+--------+----+ Hilar       49      15      0.69Hilar      69      22      0.68 +------------+--------+--------+----+-----------+--------+--------+----+  +------------------+------+------------------+------+ Right Kidney            Left Kidney              +------------------+------+------------------+------+ RAR                     RAR                      +------------------+------+------------------+------+ RAR (manual)      2.81  RAR (manual)      1.26   +------------------+------+------------------+------+ Cortex            .72 cmCortex            1.0 cm +------------------+------+------------------+------+ Cortex thickness        Corex thickness          +------------------+------+------------------+------+ Kidney length (cm)11.94 Kidney length (cm)10.04  +------------------+------+------------------+------+    Summary: Largest Aortic Diameter: 2.2 cm   Renal:   Right: Normal size right kidney. Normal right Resistive Index.        Normal cortical thickness of right kidney. 1-59% stenosis of        the right renal artery. RRV flow present. Left:  Normal size of left kidney. Normal left Resistive Index.        Normal cortical thickness of the left kidney. No evidence of        left renal artery stenosis. LRV flow present. 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   Cath 11/2021: CONCLUSIONS: Right dominant coronary anatomy with normal RCA. Left coronary system is widely patent with minimal luminal irregularities in LAD and a generalized  appearance of diffuse decreased vascular diameter. Normal LV function and LVEDP.  EF 60%.   RECOMMENDATIONS:   No obstructive disease.  Abnormal exercise treadmill test likely related to systolic pressure overload during exercise. If she continues to have recurring chest pain that requires ischemic evaluation, evaluation for or microvascular dysfunction (ANOCA) should be considered.  This evaluation could be done invasively in the Cath Lab or with PET/CT with calculation of CFR. Recommend better blood pressure control and incorporation of beta-blocker if possible.  ETT 11/30/21:   Stress ECG interpretation confounded by resting ST-T wave abnormalities. However, there is worsening of ST segment depressions with stress, horizontal depressions, and frequent PVCs with stress. These findings suggests Stress ECG is abnormal and positive for ischemia. Consider imaging guided stress testing for further evaluation.   A Bruce protocol stress test was performed. Exercise capacity was normal. Patient exercised for 6 min and 4 sec. Maximum HR of 153 bpm. MPHR 91.0 %. Peak METS 7.1 . The patient experienced no angina during the test. The patient requested the test to be stopped. The patient reported dyspnea and dizziness during the stress test. Patient also experienced neck pain with stress. Hypertensive blood pressure response to exercise (219/78 mmHg). Normal heart rate response noted during stress. Heart rate recovery was normal.   2.0 mm of horizontal ST depression (II, III, aVF, V4, V5 and V6) was noted. Arrhythmias during stress: frequent PVCs. Arrhythmias during recovery: frequent PVCs. ECG was interpretable and non-conclusive.   Prior study not available for comparison.  Long term Monitor 11/29/2021:  NSR   Premature ventricular contrctions (burden <1%) with bigeminy and trigeminy   Isolated 4 beat VT run   PAC's rare   Triggered events do not correlate with arrhythmia.     Patch Wear Time:  13 days  and 21 hours (2023-08-28T18:59:09-398 to 2023-09-11T16:59:00-0400)   Patient had a min HR of 51 bpm, max HR of 167 bpm, and avg HR of 76 bpm. Predominant underlying rhythm was Sinus Rhythm. 1 run of Ventricular Tachycardia occurred lasting 4 beats with a max rate of 167 bpm (avg 143 bpm). Isolated SVEs were rare (<1.0%),  SVE Couplets were rare (<1.0%), and no SVE Triplets were present. Isolated VEs were rare (<1.0%, 12270), VE Couplets were rare (<1.0%, 30), and VE Triplets were rare (<1.0%, 1). Ventricular Bigeminy and Trigeminy were present.     EKG:  EKG is personally reviewed. 01/31/2022: NSR. Rate 71 bpm.  12/06/2021:  Sinus rhythm. Nonspecific T wave abnormalities.  10/29/2021 (ED):   Normal sinus rhythm at 62 bpm. T wave abnormality, consider anterior ischemia.  Recent Labs: 11/03/2021: ALT 12; Brain Natriuretic Peptide 6; TSH 1.69 12/06/2021: BUN 13; Creatinine, Ser 0.93; Hemoglobin 14.4; Platelets 332; Potassium 4.3; Sodium 141   Recent Lipid Panel    Component Value Date/Time   CHOL 223 (H) 11/03/2021 0000   TRIG 360 (H) 11/03/2021 0000   HDL 36 (L) 11/03/2021 0000   CHOLHDL 6.2 (H) 11/03/2021 0000   VLDL 63 (H) 11/11/2016 0951   LDLCALC 136 (H) 11/03/2021 0000     Risk Assessment/Calculations:           Physical Exam:    VS:  BP (!) 153/90   Pulse 71   Ht '5\' 6"'$  (1.676 m)   Wt 219 lb (99.3  kg)   LMP  (LMP Unknown)   SpO2 96%   BMI 35.35 kg/m     Wt Readings from Last 3 Encounters:  01/31/22 219 lb (99.3 kg)  01/25/22 215 lb (97.5 kg)  01/20/22 215 lb (97.5 kg)     GEN: Well nourished, well developed in no acute distress HEENT: Normal NECK: No JVD; No carotid bruits CARDIAC: RRR, 1/6 soft systolic murmur, No rubs, no gallops RESPIRATORY:  Clear to auscultation without rales, wheezing or rhonchi  ABDOMEN: Soft, non-tender, non-distended MUSCULOSKELETAL:  No edema; No deformity  SKIN: Warm and dry NEUROLOGIC:  Alert and oriented x 3 PSYCHIATRIC:  Normal  affect   ASSESSMENT:    1. Microvascular angina   2. Chest pain of uncertain etiology   3. Mixed hyperlipidemia   4. Chest pain, unspecified type   5. Nonobstructive atherosclerosis of coronary artery   6. Medication management   7. Tobacco use   8. Primary hypertension   9. Pure hypercholesterolemia    PLAN:    In order of problems listed above:  #Nonobstructive CAD with Concern for Microvascular Angina: #Chest Pain: Patient with positive ETT on 11/2021 prompting cath on 12/09/21 which revealed mild, nonobstructive disease. Has ongoing intermittent exertional chest pain as well as some episodes of chest pain at rest with concern for microvascular angina vs vasospasm. Will optimize antianginals as below. -Continue amlodipine '5mg'$  daily -Start imdur '30mg'$  daily -BP control as below -Continue ASA '81mg'$  daily, crestor '20mg'$  daily -Continue nitro prn -Quit smoking!  #Dizziness: Patient has had a 2 year history of intermittent episodes of dizziness that mainly occur with standing but can occur with sitting as well. Symptoms can last for several minutes to several hours and nothing seems to make them better. Not associated with low blood pressure or position changes. Cardiac monitor 11/2021 reviewed by me and reveals 1 run of nonsustained VT lasting 4 beats with rare PVCs and SVE. TTE with normal BiV function and no significant valve disease. Cath without obstructive CAD.  -Reassuring cardiac work-up  #HTN: Fluctuates but overalll improved from prior. -Continue enalapril '10mg'$  BID -Continue amlodipine '5mg'$  daily -Start imdur '30mg'$  daily as above -Continue monitoring BP with home cuff and adjust as needed  #HLD: Goal LDL<70. -Continue crestor '20mg'$  daily -Repeat lipids for monitoring  #Smoking: -Quit and is now using nicotine patch     Follow-up: 3 months.  Medication Adjustments/Labs and Tests Ordered: Current medicines are reviewed at length with the patient today.  Concerns  regarding medicines are outlined above.   Orders Placed This Encounter  Procedures   Lipid Profile   EKG 12-Lead   Meds ordered this encounter  Medications   DISCONTD: cloNIDine (CATAPRES) 0.1 MG tablet    Sig: Take 1 tablet (0.1 mg total) by mouth 3 (three) times daily as needed (systolic blood pressure >147).    Dispense:  90 tablet    Refill:  1   nicotine (NICODERM CQ - DOSED IN MG/24 HOURS) 21 mg/24hr patch    Sig: Place 1 patch (21 mg total) onto the skin daily.    Dispense:  28 patch    Refill:  3   isosorbide mononitrate (IMDUR) 30 MG 24 hr tablet    Sig: Take 1 tablet (30 mg total) by mouth daily.    Dispense:  90 tablet    Refill:  2   Patient Instructions  Medication Instructions:   STOP TAKING CLONIDINE NOW  START TAKING ISOSORBIDE MONONITRATE (IMDUR) 30 MG BY MOUTH  DAILY  WE SENT IN THE 21 MG NICOTINE PATCH TO YOUR PHARMACY--PLACE ONE PATCH ONTO THE SKIN DAILY  *If you need a refill on your cardiac medications before your next appointment, please call your pharmacy*   Lab Work:  TOMORROW 02/01/22 IN THE OFFICE--CHECK LIPIDS--PLEASE COME FASTING TO THIS LAB APPOINTMENT  If you have labs (blood work) drawn today and your tests are completely normal, you will receive your results only by: [facility] (if you have MyChart) OR A paper copy in the mail If you have any lab test that is abnormal or we need to change your treatment, we will call you to review the results.    Follow-Up:  3 MONTHS WITH AN EXTENDER IN THE OFFICE PER DR. Isaiah Blakes SCHEDULE WITH AN EXTENDER      Important Information About Sugar        I,Mitra Faeizi,acting as a scribe for Freada Bergeron, MD.,have documented all relevant documentation on the behalf of Freada Bergeron, MD,as directed by  Freada Bergeron, MD while in the presence of Freada Bergeron, MD.   I, Freada Bergeron, MD, have reviewed all documentation for this visit. The documentation  on 01/31/22 for the exam, diagnosis, procedures, and orders are all accurate and complete.   Signed, Freada Bergeron, MD  01/31/2022 5:18 PM    [place]

## 2022-02-01 ENCOUNTER — Other Ambulatory Visit: Payer: Self-pay

## 2022-02-01 ENCOUNTER — Ambulatory Visit: Payer: 59 | Attending: Cardiology

## 2022-02-01 DIAGNOSIS — I2510 Atherosclerotic heart disease of native coronary artery without angina pectoris: Secondary | ICD-10-CM

## 2022-02-01 DIAGNOSIS — Z79899 Other long term (current) drug therapy: Secondary | ICD-10-CM

## 2022-02-01 DIAGNOSIS — R079 Chest pain, unspecified: Secondary | ICD-10-CM

## 2022-02-01 DIAGNOSIS — E782 Mixed hyperlipidemia: Secondary | ICD-10-CM

## 2022-02-01 DIAGNOSIS — K551 Chronic vascular disorders of intestine: Secondary | ICD-10-CM

## 2022-02-01 LAB — LIPID PANEL
Chol/HDL Ratio: 3.9 ratio (ref 0.0–4.4)
Cholesterol, Total: 153 mg/dL (ref 100–199)
HDL: 39 mg/dL — ABNORMAL LOW (ref >39)
LDL Chol Calc (NIH): 81 mg/dL (ref 0–99)
Triglycerides: 195 mg/dL — ABNORMAL HIGH (ref 0–149)
VLDL Cholesterol Cal: 33 mg/dL (ref 5–40)

## 2022-02-02 ENCOUNTER — Telehealth: Payer: Self-pay

## 2022-02-02 DIAGNOSIS — E782 Mixed hyperlipidemia: Secondary | ICD-10-CM

## 2022-02-02 MED ORDER — EZETIMIBE 10 MG PO TABS: 10.0000 mg | ORAL_TABLET | Freq: Every day | ORAL | 3 refills | Status: DC

## 2022-02-02 NOTE — Telephone Encounter (Signed)
-----   Message from Freada Bergeron, MD sent at 02/01/2022  8:07 PM EST ----- Her cholesterol is elevated with LDL 81 (goal <70). Can we start zetia '10mg'$  daily and repeat lipids in 8 weeks.

## 2022-02-02 NOTE — Telephone Encounter (Signed)
The patient has been notified of the result and verbalized understanding.  All questions (if any) were answered. Bernestine Amass, RN 02/02/2022 11:56 AM  Zetia has been sent in. Labs have been ordered. Patient will call back to schedule.

## 2022-02-04 ENCOUNTER — Encounter (HOSPITAL_COMMUNITY): Payer: Self-pay | Admitting: Vascular Surgery

## 2022-02-04 ENCOUNTER — Other Ambulatory Visit: Payer: Self-pay

## 2022-02-04 ENCOUNTER — Ambulatory Visit (HOSPITAL_COMMUNITY)
Admission: RE | Admit: 2022-02-04 | Discharge: 2022-02-04 | Disposition: A | Discharge status: Home / Self Care | Payer: 59 | Attending: Vascular Surgery | Admitting: Vascular Surgery

## 2022-02-04 ENCOUNTER — Encounter (HOSPITAL_COMMUNITY)
Admission: RE | Disposition: A | Discharge status: Home / Self Care | Payer: Self-pay | Source: Home / Self Care | Attending: Vascular Surgery

## 2022-02-04 DIAGNOSIS — K551 Chronic vascular disorders of intestine: Secondary | ICD-10-CM

## 2022-02-04 DIAGNOSIS — R109 Unspecified abdominal pain: Secondary | ICD-10-CM | POA: Insufficient documentation

## 2022-02-04 DIAGNOSIS — Z87891 Personal history of nicotine dependence: Secondary | ICD-10-CM | POA: Insufficient documentation

## 2022-02-04 DIAGNOSIS — I10 Essential (primary) hypertension: Secondary | ICD-10-CM | POA: Diagnosis not present

## 2022-02-04 DIAGNOSIS — E785 Hyperlipidemia, unspecified: Secondary | ICD-10-CM | POA: Diagnosis not present

## 2022-02-04 DIAGNOSIS — I701 Atherosclerosis of renal artery: Secondary | ICD-10-CM | POA: Diagnosis present

## 2022-02-04 HISTORY — PX: VISCERAL ANGIOGRAPHY: CATH118276

## 2022-02-04 LAB — POCT I-STAT, CHEM 8
BUN: 15 mg/dL (ref 6–20)
Calcium, Ion: 1.13 mmol/L — ABNORMAL LOW (ref 1.15–1.40)
Chloride: 109 mmol/L (ref 98–111)
Creatinine, Ser: 0.7 mg/dL (ref 0.44–1.00)
Glucose, Bld: 115 mg/dL — ABNORMAL HIGH (ref 70–99)
HCT: 41 % (ref 36.0–46.0)
Hemoglobin: 13.9 g/dL (ref 12.0–15.0)
Potassium: 4 mmol/L (ref 3.5–5.1)
Sodium: 143 mmol/L (ref 135–145)
TCO2: 21 mmol/L — ABNORMAL LOW (ref 22–32)

## 2022-02-04 SURGERY — VISCERAL ANGIOGRAPHY
Anesthesia: LOCAL

## 2022-02-04 MED ORDER — SODIUM CHLORIDE 0.9 % IV SOLN: 250.0000 mL | INTRAVENOUS | Status: DC | PRN

## 2022-02-04 MED ORDER — SODIUM CHLORIDE 0.9% FLUSH: 3.0000 mL | INTRAVENOUS | Status: DC | PRN

## 2022-02-04 MED ORDER — LIDOCAINE HCL (PF) 1 % IJ SOLN
INTRAMUSCULAR | Status: DC | PRN
  Administered 2022-02-04: 15 mL

## 2022-02-04 MED ORDER — SODIUM CHLORIDE 0.9% FLUSH: 3.0000 mL | Freq: Two times a day (BID) | INTRAVENOUS | Status: DC

## 2022-02-04 MED ORDER — MIDAZOLAM HCL 2 MG/2ML IJ SOLN
INTRAMUSCULAR | Status: AC
  Filled 2022-02-04: qty 2

## 2022-02-04 MED ORDER — HYDRALAZINE HCL 20 MG/ML IJ SOLN: 5.0000 mg | INTRAMUSCULAR | Status: DC | PRN

## 2022-02-04 MED ORDER — ACETAMINOPHEN 325 MG PO TABS: 650.0000 mg | ORAL_TABLET | ORAL | Status: DC | PRN

## 2022-02-04 MED ORDER — LIDOCAINE HCL (PF) 1 % IJ SOLN
INTRAMUSCULAR | Status: AC
  Filled 2022-02-04: qty 30

## 2022-02-04 MED ORDER — LABETALOL HCL 5 MG/ML IV SOLN: 10.0000 mg | INTRAVENOUS | Status: DC | PRN

## 2022-02-04 MED ORDER — ONDANSETRON HCL 4 MG/2ML IJ SOLN: 4.0000 mg | Freq: Four times a day (QID) | INTRAMUSCULAR | Status: DC | PRN

## 2022-02-04 MED ORDER — HEPARIN (PORCINE) IN NACL 1000-0.9 UT/500ML-% IV SOLN
INTRAVENOUS | Status: DC | PRN
  Administered 2022-02-04 (×2): 500 mL

## 2022-02-04 MED ORDER — SODIUM CHLORIDE 0.9 % IV SOLN: INTRAVENOUS | Status: DC

## 2022-02-04 MED ORDER — IODIXANOL 320 MG/ML IV SOLN
INTRAVENOUS | Status: DC | PRN
  Administered 2022-02-04: 100 mL via INTRA_ARTERIAL

## 2022-02-04 MED ORDER — HEPARIN (PORCINE) IN NACL 1000-0.9 UT/500ML-% IV SOLN
INTRAVENOUS | Status: AC
  Filled 2022-02-04: qty 1000

## 2022-02-04 MED ORDER — MIDAZOLAM HCL 2 MG/2ML IJ SOLN
INTRAMUSCULAR | Status: DC | PRN
  Administered 2022-02-04 (×2): 1 mg via INTRAVENOUS

## 2022-02-04 MED ORDER — FENTANYL CITRATE (PF) 100 MCG/2ML IJ SOLN
INTRAMUSCULAR | Status: DC | PRN
  Administered 2022-02-04 (×2): 25 ug via INTRAVENOUS

## 2022-02-04 MED ORDER — FENTANYL CITRATE (PF) 100 MCG/2ML IJ SOLN
INTRAMUSCULAR | Status: AC
  Filled 2022-02-04: qty 2

## 2022-02-04 SURGICAL SUPPLY — 10 items
CATH OMNI FLUSH 5F 65CM (CATHETERS) IMPLANT
DEVICE CLOSURE MYNXGRIP 5F (Vascular Products) IMPLANT
KIT MICROPUNCTURE NIT STIFF (SHEATH) IMPLANT
KIT PV (KITS) ×1 IMPLANT
SHEATH PINNACLE 5F 10CM (SHEATH) IMPLANT
SHEATH PROBE COVER 6X72 (BAG) IMPLANT
SYR MEDRAD MARK V 150ML (SYRINGE) IMPLANT
TRANSDUCER W/STOPCOCK (MISCELLANEOUS) ×1 IMPLANT
TRAY PV CATH (CUSTOM PROCEDURE TRAY) ×1 IMPLANT
WIRE BENTSON .035X145CM (WIRE) IMPLANT

## 2022-02-04 NOTE — Op Note (Signed)
    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  Ultrasound-guided access right common femoral artery 2.  Mesenteric arteriogram with catheter selection of aorta 3.  Mynx closure of the right common femoral artery 4.  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  She endorsed postprandial abdominal pain.  She presents today for mesenteric arteriogram with possible intervention after risks benefits discussed.  Findings:   Ultrasound-guided access right common femoral artery.  Initial mesenteric arteriogram showed the SMA was widely patent with no stenosis.  The celiac had a slightly oblique orientation off the abdominal aorta and we had to look at this in multiple views and ultimately did not see any significant stenosis and a downward projecting artery that was also widely patent.  No intervention performed.   Procedure:  The patient was identified in the holding area and taken to room 8.  The patient was then placed supine on the table and prepped and draped in the usual sterile fashion.  A time out was called.  Patient received Versed and Fentanyl for moderate sedation.  Vital signs were monitored including heart rate, respiratory rate, oxygenation and blood pressure.  I was present for all of sedation.  Ultrasound was used to evaluate the right common femoral artery.  It was patent .  A digital ultrasound image was acquired.  A micropuncture needle was used to access the right common femoral artery under ultrasound guidance.  An 018 wire was advanced  without resistance and a micropuncture sheath was placed.  The 018 wire was removed and a benson wire was placed.  The micropuncture sheath was exchanged for a 5 french sheath.  An omniflush catheter was advanced over the wire to the level of L-1.  Initially placed the C arm in the lateral position and a mesenteric arteriogram was obtained.  The SMA was widely patent.  The celiac had a slightly oblique orientation coming off the abdominal aorta so we had to look at this with several additional views finally with the C arm in the left anterior oblique position between 40 and 55 degrees and we did not feel there was any stenosis in the celiac artery to warrant intervention.  Wires and catheters were removed.  A 5 French Mynx was deployed in the right common femoral artery.  Taken to holding in stable condition.  Plan: Will have my office refer the patient to GI for further workup of her postprandial abdominal pain given no evidence of mesenteric stenosis to explain her symptoms.  Marty Heck, MD [facility] Office: [phone]

## 2022-02-04 NOTE — H&P (Signed)
History and Physical Interval Note:  02/04/2022 8:53 AM  [name]  has presented today for surgery, with the diagnosis of mesentric ischemia.  The various methods of treatment have been discussed with the patient and family. After consideration of risks, benefits and other options for treatment, the patient has consented to  Procedure(s): VISCERAL ANGIOGRAPHY/Mesentric (N/A) as a surgical intervention.  The patient's history has been reviewed, patient examined, no change in status, stable for surgery.  I have reviewed the patient's chart and labs.  Questions were answered to the patient's satisfaction.     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  Patient has a history of tobacco abuse and quit smoking recently.  She noted her blood pressure skyrocketed into the 408X systolic.  She had a renal artery duplex ordered on 01/11/2022 with findings of 1 to 59% right renal artery stenosis and no left renal artery stenosis.  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  She endorses postprandial abdominal pain for the last 2 years.  She states this happens about 5-6 times a week after she eats.  Generalized pain.  She tries to eat smaller meals.  She has actually gained some weight because of her smoking cessation and trying to avoid cigarettes.  States nobody has been able to determine the cause of her postprandial abdominal pain in the past.       Past Medical History:  Diagnosis Date   ADD (attention deficit disorder) 06/14/2012   Anxiety     Bronchitis     Complication of anesthesia 1987    states woke up  during appedentomy   Depression 06/14/2012   Hepatic steatosis 11/13/2018    Hepatic steatosis with hepatomegaly seen on MRI abd on 11/2018   History of leukocytosis 06/14/2012    Reports "WBC 32" decades ago with negative hematology specialist workup.    Hyperlipidemia 06/14/2012   Hypertension     Seasonal allergies 06/14/2012           Past Surgical History:  Procedure Laterality Date   APPENDECTOMY       BICEPT TENODESIS Right 10/10/2019    Procedure: BICEPS TENODESIS;  Surgeon: Hiram Gash, MD;  Location: [facility];  Service: Orthopedics;  Laterality: Right;   bladder sling/mesh       INGUINAL HERNIA REPAIR       intrauterine ablation       LEFT HEART CATH AND CORONARY ANGIOGRAPHY N/A 12/09/2021    Procedure: LEFT HEART CATH AND CORONARY ANGIOGRAPHY;  Surgeon: Belva Crome, MD;  Location: [facility];  Service: Cardiovascular;  Laterality: N/A;   NASAL SINUS SURGERY       TUBAL LIGATION               Family History  Problem Relation Age of Onset   Depression Mother     Diabetes Father     Hyperlipidemia Father     Hypertension Father  Depression Sister     Breast cancer Maternal Grandmother     Depression Son     Sleep apnea Neg Hx        SOCIAL HISTORY: Social History         Socioeconomic History   Marital status: Married      Spouse name: Not on file   Number of children: Not on file   Years of education: Not on file   Highest education level: Not on file  Occupational History   Not on file  Tobacco Use   Smoking status: Every Day      Packs/day: 0.25      Types: Cigarettes   Smokeless tobacco: Never   Tobacco comments:      30 years , currently on Chantix  Vaping Use   Vaping Use: Never used  Substance and Sexual Activity   Alcohol use: Not Currently   Drug use: No   Sexual activity: Not on file  Other Topics Concern   Not on file  Social History Narrative   Not on file    Social Determinants of Health     Financial Resource Strain: Not on file  Food Insecurity: Not on file  Transportation Needs: Not on file  Physical Activity: Not on file  Stress: Not on file  Social Connections: Not on file  Intimate Partner Violence: Not on file           Allergies  Allergen Reactions   Macrobid [Nitrofurantoin] Hives and Rash   Other Hives      Preservative used in vaccinations   Tetanus Toxoids Swelling      Pt states she was told the Tdap injection would have to be administered in the hospital   After discussion on 11/10 pt remembers being told it was thimersol in the tetanus 30+years ago that caused the swelling.      Thimerosal (Thiomersal) Rash            Current Outpatient Medications  Medication Sig Dispense Refill   amLODipine (NORVASC) 5 MG tablet Take 1 tablet (5 mg total) by mouth daily. 90 tablet 3   amphetamine-dextroamphetamine (ADDERALL) 10 MG tablet Take 1 tablet (10 mg total) by mouth daily with breakfast. (Patient taking differently: Take 10 mg by mouth daily with breakfast. Taking PRN) 30 tablet 0   aspirin EC 81 MG tablet Take 1 tablet (81 mg total) by mouth daily. 90 tablet 3   cetirizine (ZYRTEC) 10 MG tablet Take 20 mg by mouth daily.       clonazePAM (KLONOPIN) 0.5 MG tablet TAKE 1 TABLET(0.5 MG) BY MOUTH THREE TIMES DAILY AS NEEDED FOR ANXIETY 30 tablet 3   enalapril (VASOTEC) 10 MG tablet Take 1 tablet (10 mg total) by mouth 2 (two) times daily. 60 tablet 1   escitalopram (LEXAPRO) 20 MG tablet Take 2 tablets (40 mg total) by mouth daily. 180 tablet 3   gabapentin (NEURONTIN) 300 MG capsule Take 1 capsule (300 mg total) by mouth 3 (three) times daily. 270 capsule 3   meclizine (ANTIVERT) 25 MG tablet Take 1 tablet (25 mg total) by mouth 3 (three) times daily as needed for dizziness. 30 tablet 0   nitroGLYCERIN (NITROSTAT) 0.4 MG SL tablet Place 1 tablet (0.4 mg total) under the tongue every 5 (five) minutes as needed for chest pain. 25 tablet 3   omeprazole (PRILOSEC)  40 MG capsule Take 1 capsule (40 mg total) by mouth in the morning and at bedtime. [place]  capsule 3   rosuvastatin (CRESTOR) 20 MG tablet Take 1 tablet (20 mg total) by mouth daily. 90 tablet 3   cloNIDine (CATAPRES) 0.1 MG tablet Take 1 tablet (0.1 mg total) by mouth 3 (three) times daily as needed (systolic blood pressure >675). (Patient not taking: Reported on 12/30/2021) 90 tablet 0    No current facility-administered medications for this visit.      REVIEW OF SYSTEMS:  '[X]'$  denotes positive finding, '[ ]'$  denotes negative finding Cardiac   Comments:  Chest pain or chest pressure:      Shortness of breath upon exertion:      Short of breath when lying flat:      Irregular heart rhythm:             Vascular      Pain in calf, thigh, or hip brought on by ambulation:      Pain in feet at night that wakes you up from your sleep:       Blood clot in your veins:      Leg swelling:              Pulmonary      Oxygen at home:      Productive cough:       Wheezing:              Neurologic      Sudden weakness in arms or legs:       Sudden numbness in arms or legs:       Sudden onset of difficulty speaking or slurred speech:      Temporary loss of vision in one eye:       Problems with dizziness:              Gastrointestinal      Blood in stool:       Vomited blood:              Genitourinary      Burning when urinating:       Blood in urine:             Psychiatric      Major depression:              Hematologic      Bleeding problems:      Problems with blood clotting too easily:             Skin      Rashes or ulcers:             Constitutional      Fever or chills:          PHYSICAL EXAM:    Vitals:    01/25/22 0901  BP: (!) 140/97  Pulse: (!) 59  Resp: 16  Temp: 98 F (36.7 C)  TempSrc: Temporal  SpO2: 96%  Weight: 215 lb (97.5 kg)  Height: '5\' 6"'$  (1.676 m)      GENERAL: The patient is a well-nourished female, in no acute distress. The vital signs are  documented above. CARDIAC: There is a regular rate and rhythm.  VASCULAR:  Palpable femoral pulses bilaterally Palpable PT pulses bilaterally PULMONARY: No respiratory distress. ABDOMEN: Soft and non-tender. MUSCULOSKELETAL: There are no major deformities or cyanosis. NEUROLOGIC: No focal weakness or paresthesias are detected. SKIN: There are no ulcers or rashes noted. PSYCHIATRIC: The patient has a normal affect.   DATA:    ABDOMINAL VISCERAL  Patient Name:  ORIA KLIMAS [facility]  Date of Exam:   01/11/2022 Medical Rec #: [id number]            Accession #:    [id number] Date of Birth: 17-Nov-1970           Patient Gender: F Patient Age:   1 years Exam Location:  [facility] Procedure:      VAS US RENAL ARTERY DUPLEX Referring Phys:   --------------------------------------------------------------------------- -----  Indications: Secondary hypertension. Patient denies any abdominal pain.  High Risk Factors: Hypertension, hyperlipidemia, past history of smoking.                    Patient reports she has quit smoking x 3 weeks.  Limitations: Air/bowel gas and obesity. Comparison Study: NA  Performing Technologist: Sharlett Iles RVT    Examination Guidelines: A complete evaluation includes B-mode imaging, spectral Doppler, color Doppler, and power Doppler as needed of all accessible portions of each vessel. Bilateral testing is considered an integral part of a complete examination. Limited examinations for reoccurring indications may be performed as noted.    Duplex Findings: +----------------------+--------+--------+------+----------------------+ Mesenteric            PSV cm/sEDV cm/sPlaque       Comments        +----------------------+--------+--------+------+----------------------+ Aorta Prox               97                 2.2 cm AP x 2.2 cm TRV +----------------------+--------+--------+------+----------------------+ Aorta Distal             73                                         +----------------------+--------+--------+------+----------------------+ Celiac Artery Origin    216                                        +----------------------+--------+--------+------+----------------------+ Celiac Artery Proximal  461     115                                +----------------------+--------+--------+------+----------------------+ SMA Origin              299      38                                +----------------------+--------+--------+------+----------------------+ SMA Proximal            264      26                                +----------------------+--------+--------+------+----------------------+ IMA                     196      26                                +----------------------+--------+--------+------+----------------------+          +------------------+--------+--------+-------+ Right Renal ArteryPSV cm/sEDV cm/sComment +------------------+--------+--------+-------+ Origin              250  67           +------------------+--------+--------+-------+ Proximal            273      59           +------------------+--------+--------+-------+ Mid                 154      30           +------------------+--------+--------+-------+ Distal               75      18           +------------------+--------+--------+-------+  +-----------------+--------+--------+-------+ Left Renal ArteryPSV cm/sEDV cm/sComment +-----------------+--------+--------+-------+ Origin             114      37           +-----------------+--------+--------+-------+ Proximal           122      36           +-----------------+--------+--------+-------+ Mid                119      35           +-----------------+--------+--------+-------+ Distal              83      28            +-----------------+--------+--------+-------+  +------------+--------+--------+----+-----------+--------+--------+----+ Right KidneyPSV cm/sEDV cm/sRI  Left KidneyPSV cm/sEDV cm/sRI   +------------+--------+--------+----+-----------+--------+--------+----+ Upper Pole  34      11      0.68Upper Pole 30      11      0.63 +------------+--------+--------+----+-----------+--------+--------+----+ Mid         34      10      0.70Mid        29      10      0.66 +------------+--------+--------+----+-----------+--------+--------+----+ Lower Pole  19      6       0.68Lower Pole 41      13      0.68 +------------+--------+--------+----+-----------+--------+--------+----+ Hilar       49      15      0.69Hilar      69      22      0.68 +------------+--------+--------+----+-----------+--------+--------+----+  +------------------+------+------------------+------+ Right Kidney            Left Kidney              +------------------+------+------------------+------+ RAR                     RAR                      +------------------+------+------------------+------+ RAR (manual)      2.81  RAR (manual)      1.26   +------------------+------+------------------+------+ Cortex            .72 cmCortex            1.0 cm +------------------+------+------------------+------+ Cortex thickness        Corex thickness          +------------------+------+------------------+------+ Kidney length (cm)11.94 Kidney length (cm)10.04  +------------------+------+------------------+------+    Summary: Largest Aortic Diameter: 2.2 cm   Renal:   Right: Normal size right kidney. Normal right Resistive Index.        Normal cortical thickness of right kidney. 1-59% stenosis of  the right renal artery. RRV flow present. Left:  Normal size of left kidney. Normal left Resistive Index.        Normal cortical thickness of the left kidney. No  evidence of        left renal artery stenosis. LRV flow present. Mesenteric: 70 to 99% stenosis in the celiac artery and superior mesenteric artery.   Patent IVC.   *See table(s) above for measurements and observations.      Assessment/Plan:   51 year old female with history of hyperlipidemia and previous tobacco abuse that had a renal artery duplex on 01/11/2022 with no evidence of high-grade renal artery stenosis to explain her hypertension.  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  I discussed etiology of chronic mesenteric ischemia.  I discussed with these incidental findings on ultrasound there would be no indication for intervention without symptoms.  However, she endorses postprandial abdominal pain for the last several years.  She has read about chronic mesenteric ischemia and feels that this explains her symptoms.  I have offered her a mesenteric arteriogram in the Cath Lab.  Discussed that it is unusual that she has gained weight given the concern for chronic mesenteric ischemia.  Discussed if we do find anything high-grade we may evaluate for stent placement.  We will get this scheduled on 02/24/2022.  Risk and benefits discussed.  Discussed this being done as an outpatient.     Marty Heck, MD [facility] Office: [phone]

## 2022-02-09 ENCOUNTER — Encounter: Payer: Self-pay | Admitting: Cardiology

## 2022-02-10 ENCOUNTER — Encounter: Payer: Self-pay | Admitting: Physician Assistant

## 2022-02-10 ENCOUNTER — Ambulatory Visit
Admission: EM | Admit: 2022-02-10 | Discharge: 2022-02-10 | Disposition: A | Discharge status: Home / Self Care | Payer: 59 | Attending: Family Medicine | Admitting: Family Medicine

## 2022-02-10 ENCOUNTER — Ambulatory Visit (INDEPENDENT_AMBULATORY_CARE_PROVIDER_SITE_OTHER): Payer: 59

## 2022-02-10 ENCOUNTER — Other Ambulatory Visit: Payer: Self-pay

## 2022-02-10 ENCOUNTER — Encounter: Payer: Self-pay | Admitting: Emergency Medicine

## 2022-02-10 DIAGNOSIS — R1909 Other intra-abdominal and pelvic swelling, mass and lump: Secondary | ICD-10-CM | POA: Diagnosis not present

## 2022-02-10 DIAGNOSIS — R590 Localized enlarged lymph nodes: Secondary | ICD-10-CM | POA: Diagnosis not present

## 2022-02-10 DIAGNOSIS — R1084 Generalized abdominal pain: Secondary | ICD-10-CM | POA: Diagnosis not present

## 2022-02-10 NOTE — ED Triage Notes (Signed)
Knot in Rt groin x 3 days Had angiogram 7 days ago entrance is near sight Patient did not call doctor

## 2022-02-10 NOTE — ED Provider Notes (Signed)
Sarah Nicholson CARE    CSN: [id number] Arrival date & time: 02/10/22  1305      History   Chief Complaint Chief Complaint  Patient presents with   Lump    HPI Sarah Nicholson is a 51 y.o. female.   HPI  Sarah Nicholson had an angiogram done 7 days ago.)  And right groin.  Patient states she has a large knot in this area.  It is tender.  She states it is not getting better.  At times it seems to be larger or smaller.  Past Medical History:  Diagnosis Date   ADD (attention deficit disorder) 06/14/2012   Anxiety    Bronchitis    Complication of anesthesia 1987   states woke up during appedentomy   Depression 06/14/2012   Hepatic steatosis 11/13/2018   Hepatic steatosis with hepatomegaly seen on MRI abd on 11/2018   History of leukocytosis 06/14/2012   Reports "WBC 32" decades ago with negative hematology specialist workup.    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   Abnormal stress test 12/09/2021   Annual physical exam 11/24/2021   Hemiparesis, left ([place]) 11/03/2021   Herpes zoster 08/06/2021   Femoral hernia 03/04/2021   Leukocytosis 01/16/2021   Left lateral abdominal pain 01/15/2021   Lumbar spondylosis 01/15/2021   Trochanteric bursitis, left hip 12/04/2020   Hepatic steatosis 11/13/2018   Tobacco use 07/18/2018   Irritable bowel syndrome (IBS) 05/04/2017   Hallux valgus 12/09/2014   Chest pain of uncertain etiology 59/93/5701   Other and unspecified ovarian cyst 06/25/2013   Left tennis elbow 06/25/2013   Microscopic hematuria 06/27/2012   Hyperlipidemia 06/14/2012   Prediabetes 06/14/2012   Seasonal allergies 06/14/2012   Depression 06/14/2012   ADD (attention deficit disorder) 06/14/2012    Past Surgical History:  Procedure Laterality Date   APPENDECTOMY      BICEPT TENODESIS Right 10/10/2019   Procedure: BICEPS TENODESIS;  Surgeon: Hiram Gash, MD;  Location: [facility];  Service: Orthopedics;  Laterality: Right;   bladder sling/mesh     INGUINAL HERNIA REPAIR     intrauterine ablation     LEFT HEART CATH AND CORONARY ANGIOGRAPHY N/A 12/09/2021   Procedure: LEFT HEART CATH AND CORONARY ANGIOGRAPHY;  Surgeon: Belva Crome, MD;  Location: [facility];  Service: Cardiovascular;  Laterality: N/A;   NASAL SINUS SURGERY     TUBAL LIGATION     VISCERAL ANGIOGRAPHY N/A 02/04/2022   Procedure: VISCERAL ANGIOGRAPHY/Mesentric;  Surgeon: Marty Heck, MD;  Location: [facility];  Service: Cardiovascular;  Laterality: N/A;    OB History     Gravida  5   Para  4   Term  2   Preterm  2   AB  1   Living  3      SAB      IAB      Ectopic      Multiple      Live Births               Home Medications    Prior to Admission medications   Medication Sig Start Date End Date Taking? Authorizing Provider  ezetimibe (ZETIA) 10 MG tablet Take 1 tablet (10 mg total) by mouth daily. 02/02/22   Gwyndolyn Kaufman  E, MD  amLODipine (NORVASC) 5 MG tablet Take 1 tablet (5 mg total) by mouth daily. 12/30/21   Marylu Lund., NP  amphetamine-dextroamphetamine (ADDERALL) 10 MG tablet Take 1 tablet (10 mg total) by mouth daily with breakfast. Patient taking differently: Take 10 mg by mouth daily as needed (Focus). 09/23/21   Silverio Decamp, MD  aspirin EC 81 MG tablet Take 1 tablet (81 mg total) by mouth daily. 12/01/21   Silverio Decamp, MD  cetirizine (ZYRTEC) 10 MG tablet Take 20 mg by mouth daily as needed for allergies ([place]).    [provider]  clonazePAM (KLONOPIN) 0.5 MG tablet TAKE 1 TABLET(0.5 MG) BY MOUTH THREE TIMES DAILY AS NEEDED FOR ANXIETY 03/29/21   Silverio Decamp, MD  enalapril (VASOTEC) 10 MG tablet Take 1 tablet (10 mg total) by mouth 2 (two) times daily.  11/12/21   Silverio Decamp, MD  escitalopram (LEXAPRO) 20 MG tablet Take 2 tablets (40 mg total) by mouth daily. 07/14/21   Silverio Decamp, MD  gabapentin (NEURONTIN) 300 MG capsule Take 1 capsule (300 mg total) by mouth 3 (three) times daily. 08/20/21   Silverio Decamp, MD  isosorbide mononitrate (IMDUR) 30 MG 24 hr tablet Take 1 tablet (30 mg total) by mouth daily. 01/31/22   Freada Bergeron, MD  meclizine (ANTIVERT) 25 MG tablet Take 1 tablet (25 mg total) by mouth 3 (three) times daily as needed for dizziness. Patient not taking: Reported on 02/02/2022 10/29/21   Deno Etienne, DO  nicotine (NICODERM CQ - DOSED IN MG/24 HOURS) 21 mg/24hr patch Place 1 patch (21 mg total) onto the skin daily. 01/31/22   Freada Bergeron, MD  nitroGLYCERIN (NITROSTAT) 0.4 MG SL tablet Place 1 tablet (0.4 mg total) under the tongue every 5 (five) minutes as needed for chest pain. 12/30/21   Marylu Lund., NP  omeprazole (PRILOSEC) 40 MG capsule Take 1 capsule (40 mg total) by mouth in the morning and at bedtime. 02/26/21   Silverio Decamp, MD  rosuvastatin (CRESTOR) 20 MG tablet Take 1 tablet (20 mg total) by mouth daily. 11/05/21   Silverio Decamp, MD    Family History Family History  Problem Relation Age of Onset   Depression Mother    Diabetes Father    Hyperlipidemia Father    Hypertension Father    Depression Sister    Breast cancer Maternal Grandmother    Depression Son    Sleep apnea Neg Hx     Social History Social History   Tobacco Use   Smoking status: Every Day    Packs/day: 0.25    Types: Cigarettes   Smokeless tobacco: Never   Tobacco comments:    30 years , currently on Chantix  Vaping Use   Vaping Use: Never used  Substance Use Topics   Alcohol use: Not Currently   Drug use: No     Allergies   Macrobid [nitrofurantoin], Other, Tetanus toxoids, and Thimerosal (thiomersal)   Review of Systems Review of Systems See HPI  Physical  Exam Triage Vital Signs ED Triage Vitals  Enc Vitals Group     BP 02/10/22 1345 116/73     Pulse Rate 02/10/22 1345 68     Resp 02/10/22 1345 18     Temp 02/10/22 1345 98 F (36.7 C)     Temp Source 02/10/22 1345 Oral     SpO2 02/10/22 1345 96 %     Weight 02/10/22  1346 217 lb (98.4 kg)     Height 02/10/22 1346 '5\' 6"'$  (1.676 m)     Head Circumference --      Peak Flow --      Pain Score 02/10/22 1345 4     Pain Loc --      Pain Edu? --      Excl. in [place]? --    No data found.  Updated Vital Signs BP 116/73 (BP Location: Left Arm)   Pulse 68   Temp 98 F (36.7 C) (Oral)   Resp 18   Ht '5\' 6"'$  (1.676 m)   Wt 98.4 kg   LMP  (LMP Unknown)   SpO2 96%   BMI 35.02 kg/m        Physical Exam Constitutional:      General: She is not in acute distress.    Appearance: She is well-developed.  HENT:     Head: Normocephalic and atraumatic.  Eyes:     Conjunctiva/sclera: Conjunctivae normal.     Pupils: Pupils are equal, round, and reactive to light.  Cardiovascular:     Rate and Rhythm: Normal rate.  Pulmonary:     Effort: Pulmonary effort is normal. No respiratory distress.  Abdominal:     General: There is no distension.     Palpations: Abdomen is soft.     Comments: The right groin underneath the insertion site of the catheter there is a indurated nodule that measures 2 x 3 cm.  No fluctuance  Musculoskeletal:        General: Normal range of motion.     Cervical back: Normal range of motion.  Skin:    General: Skin is warm and dry.  Neurological:     Mental Status: She is alert.      UC Treatments / Results  Labs (all labs ordered are listed, but only abnormal results are displayed) Labs Reviewed - No data to display  EKG   Radiology US PELVIS LIMITED (TRANSABDOMINAL ONLY)  Result Date: 02/10/2022 CLINICAL DATA:  51 year old female with palpable abnormality the right groin, concern for pseudoaneurysm after recent angiogram. EXAM: Limited right LOWER  EXTREMITY ARTERIAL DUPLEX SCAN TECHNIQUE: Gray-scale sonography as well as color Doppler and duplex ultrasound was performed to evaluate the lower extremity arteries including the right common femoral and proximal profunda and superficial femoral arteries. COMPARISON:  None Available. FINDINGS: Grayscale evaluation of the right groin demonstrates multifocal lymph nodes, none measuring greater than 1 cm short axis. No fluid collections. No evidence of underlying hematoma. The right common femoral and proximal superficial and profundal femoral arteries are patent. IMPRESSION: No evidence of right groin pseudoaneurysm or hematoma. There are a few mildly prominent, non-pathologically enlarged right inguinal lymph nodes visualized. Ruthann Cancer, MD [facility] Radiology Electronically Signed   By: Ruthann Cancer M.D.   On: 02/10/2022 15:17    Procedures Procedures (including critical care time)  Medications Ordered in UC Medications - No data to display  Initial Impression / Assessment and Plan / UC Course  I have reviewed the triage vital signs and the nursing notes.  Pertinent labs & imaging results that were available during my care of the patient were reviewed by me and considered in my medical decision making (see chart for details).     Discussed results with patient. Final Clinical Impressions(s) / UC Diagnoses   Final diagnoses:  Lymphadenopathy, inguinal     Discharge Instructions      May try  warm compresses to area Over the counter pain medicine if needed     ED Prescriptions   None    PDMP not reviewed this encounter.   Raylene Everts, MD 02/10/22 [phone]

## 2022-02-10 NOTE — Discharge Instructions (Signed)
May try warm compresses to area Over the counter pain medicine if needed

## 2022-02-15 ENCOUNTER — Encounter: Payer: Self-pay | Admitting: Sports Medicine

## 2022-02-15 ENCOUNTER — Ambulatory Visit (INDEPENDENT_AMBULATORY_CARE_PROVIDER_SITE_OTHER): Payer: 59 | Admitting: Sports Medicine

## 2022-02-15 VITALS — BP 97/67 | HR 91 | Wt 217.0 lb

## 2022-02-15 DIAGNOSIS — E119 Type 2 diabetes mellitus without complications: Secondary | ICD-10-CM

## 2022-02-15 LAB — POCT GLYCOSYLATED HEMOGLOBIN (HGB A1C): Hemoglobin A1C: 6.8 % — AB (ref 4.0–5.6)

## 2022-02-15 MED ORDER — METFORMIN HCL ER 750 MG PO TB24: 750.0000 mg | ORAL_TABLET | Freq: Every day | ORAL | 11 refills | Status: DC

## 2022-02-15 MED ORDER — RYBELSUS 3 MG PO TABS: 3.0000 mg | ORAL_TABLET | Freq: Every day | ORAL | 0 refills | Status: DC

## 2022-02-15 MED ORDER — RYBELSUS 7 MG PO TABS: 7.0000 mg | ORAL_TABLET | Freq: Every day | ORAL | 11 refills | Status: DC

## 2022-02-15 NOTE — Assessment & Plan Note (Signed)
High blood sugars on her CHEM panels, A1c today was 6.8%. We will start metformin, Rybelsus, I will give her Rybelsus samples at 3 mg for a month and go ahead and call in Rybelsus 7 mg, she understands after the set milligrams we will go up to 14, this will help her lose a great deal of weight. She is already on a statin and aspirin. Already taking an ACE. I will set her up with optometry downstairs. Follow-up in 3 months for repeat hemoglobin A1c. At the follow-up visit we will catch her up on other screenings.

## 2022-02-15 NOTE — Progress Notes (Signed)
    Procedures performed today:    None.  Independent interpretation of notes and tests performed by another provider:   None.  Brief History, Exam, Impression, and Recommendations:    Controlled type 2 diabetes mellitus without complication, without long-term current use of insulin (HCC) High blood sugars on her CHEM panels, A1c today was 6.8%. We will start metformin, Rybelsus, I will give her Rybelsus samples at 3 mg for a month and go ahead and call in Rybelsus 7 mg, she understands after the set milligrams we will go up to 14, this will help her lose a great deal of weight. She is already on a statin and aspirin. Already taking an ACE. I will set her up with optometry downstairs. Follow-up in 3 months for repeat hemoglobin A1c. At the follow-up visit we will catch her up on other screenings.  I spent 30 minutes of total time managing this patient today, this includes chart review, face to face, and non-face to face time.  ____________________________________________ [name], M.D., ABFM., CAQSM., AME. Primary Care and Sports Medicine [facility]  Adjunct Professor of [employer] [employer] [employer] [facility] [employer]  Risk manager

## 2022-02-17 ENCOUNTER — Telehealth: Payer: Self-pay

## 2022-02-17 NOTE — Telephone Encounter (Addendum)
Initiated Prior authorization BIP:[id number] '7MG'$  tablets Via: Covermymeds Case/Key:[id number] Status: approved  as of 02/17/22 Reason:approved through 02/18/2023. Notified Pt via: Mychart

## 2022-02-21 ENCOUNTER — Encounter: Payer: Self-pay | Admitting: Cardiology

## 2022-02-22 ENCOUNTER — Ambulatory Visit: Payer: 59 | Admitting: Neurology

## 2022-02-22 ENCOUNTER — Other Ambulatory Visit: Payer: Self-pay | Admitting: *Deleted

## 2022-02-22 DIAGNOSIS — R519 Headache, unspecified: Secondary | ICD-10-CM

## 2022-02-22 DIAGNOSIS — G4733 Obstructive sleep apnea (adult) (pediatric): Secondary | ICD-10-CM

## 2022-02-22 DIAGNOSIS — D72825 Bandemia: Secondary | ICD-10-CM

## 2022-02-22 DIAGNOSIS — D72823 Leukemoid reaction: Secondary | ICD-10-CM

## 2022-02-22 DIAGNOSIS — R351 Nocturia: Secondary | ICD-10-CM

## 2022-02-22 DIAGNOSIS — E669 Obesity, unspecified: Secondary | ICD-10-CM

## 2022-02-22 DIAGNOSIS — D72829 Elevated white blood cell count, unspecified: Secondary | ICD-10-CM

## 2022-02-22 DIAGNOSIS — Z789 Other specified health status: Secondary | ICD-10-CM

## 2022-02-22 DIAGNOSIS — G4719 Other hypersomnia: Secondary | ICD-10-CM

## 2022-02-22 DIAGNOSIS — R0683 Snoring: Secondary | ICD-10-CM

## 2022-02-23 ENCOUNTER — Inpatient Hospital Stay (HOSPITAL_BASED_OUTPATIENT_CLINIC_OR_DEPARTMENT_OTHER): Payer: 59 | Admitting: Hematology & Oncology

## 2022-02-23 ENCOUNTER — Inpatient Hospital Stay: Payer: 59 | Attending: Hematology & Oncology

## 2022-02-23 ENCOUNTER — Encounter: Payer: Self-pay | Admitting: Cardiology

## 2022-02-23 ENCOUNTER — Encounter: Payer: Self-pay | Admitting: Hematology & Oncology

## 2022-02-23 ENCOUNTER — Other Ambulatory Visit: Payer: Self-pay

## 2022-02-23 VITALS — BP 86/52 | HR 70 | Temp 98.6°F | Resp 16 | Ht 66.0 in | Wt 213.0 lb

## 2022-02-23 DIAGNOSIS — Z87891 Personal history of nicotine dependence: Secondary | ICD-10-CM | POA: Diagnosis not present

## 2022-02-23 DIAGNOSIS — D72823 Leukemoid reaction: Secondary | ICD-10-CM

## 2022-02-23 DIAGNOSIS — D72825 Bandemia: Secondary | ICD-10-CM

## 2022-02-23 DIAGNOSIS — D72829 Elevated white blood cell count, unspecified: Secondary | ICD-10-CM

## 2022-02-23 LAB — CBC WITH DIFFERENTIAL (CANCER CENTER ONLY)
Abs Immature Granulocytes: 0.04 10*3/uL (ref 0.00–0.07)
Basophils Absolute: 0.1 10*3/uL (ref 0.0–0.1)
Basophils Relative: 1 %
Eosinophils Absolute: 0.2 10*3/uL (ref 0.0–0.5)
Eosinophils Relative: 1 %
HCT: 43.9 % (ref 36.0–46.0)
Hemoglobin: 14.3 g/dL (ref 12.0–15.0)
Immature Granulocytes: 0 %
Lymphocytes Relative: 30 %
Lymphs Abs: 3.3 10*3/uL (ref 0.7–4.0)
MCH: 29.7 pg (ref 26.0–34.0)
MCHC: 32.6 g/dL (ref 30.0–36.0)
MCV: 91.3 fL (ref 80.0–100.0)
Monocytes Absolute: 0.8 10*3/uL (ref 0.1–1.0)
Monocytes Relative: 7 %
Neutro Abs: 6.8 10*3/uL (ref 1.7–7.7)
Neutrophils Relative %: 61 %
Platelet Count: 316 10*3/uL (ref 150–400)
RBC: 4.81 MIL/uL (ref 3.87–5.11)
RDW: 13 % (ref 11.5–15.5)
WBC Count: 11.2 10*3/uL — ABNORMAL HIGH (ref 4.0–10.5)
nRBC: 0 % (ref 0.0–0.2)

## 2022-02-23 LAB — CMP (CANCER CENTER ONLY)
ALT: 30 U/L (ref 0–44)
AST: 17 U/L (ref 15–41)
Albumin: 4.4 g/dL (ref 3.5–5.0)
Alkaline Phosphatase: 73 U/L (ref 38–126)
Anion gap: 8 (ref 5–15)
BUN: 16 mg/dL (ref 6–20)
CO2: 27 mmol/L (ref 22–32)
Calcium: 9.2 mg/dL (ref 8.9–10.3)
Chloride: 107 mmol/L (ref 98–111)
Creatinine: 1.12 mg/dL — ABNORMAL HIGH (ref 0.44–1.00)
GFR, Estimated: 59 mL/min — ABNORMAL LOW (ref >60)
Glucose, Bld: 111 mg/dL — ABNORMAL HIGH (ref 70–99)
Potassium: 4 mmol/L (ref 3.5–5.1)
Sodium: 142 mmol/L (ref 135–145)
Total Bilirubin: 0.4 mg/dL (ref 0.3–1.2)
Total Protein: 7.2 g/dL (ref 6.5–8.1)

## 2022-02-23 LAB — LACTATE DEHYDROGENASE: LDH: 148 U/L (ref 98–192)

## 2022-02-23 LAB — SAVE SMEAR(SSMR), FOR PROVIDER SLIDE REVIEW

## 2022-02-23 NOTE — Progress Notes (Signed)
Hematology and Oncology Follow Up Visit  Sarah Nicholson [id number] Jul 27, 1970 51 y.o. 02/23/2022   Principle Diagnosis:  Leukocytosis-leukemoid reaction  Current Therapy:   Observation     Interim History:  Sarah Nicholson is back for follow-up.  She is dressed all in Christmas attire.  She looks very festive.  She has a different Christmas outfit started the day after Thanksgiving.  She stopped smoking now.  She stopped in October.  She is wearing the nicotine patch.  She is doing okay.  She has had no complaints.  She is still Dance movement psychotherapist at [employer].  She is quite busy.  She has had no problem with infections.  She has had no rashes.  She has had no change in bowel or bladder habits.  She has had no cough or shortness of breath.  She says she had a mammogram recently.  This was in late October.  Everything looked fine.  She has had no issues with bleeding.  There is no headache.  Overall, I would say that her performance status is ECOG 0.    Medications:  Current Outpatient Medications:    amLODipine (NORVASC) 5 MG tablet, Take 1 tablet (5 mg total) by mouth daily., Disp: 90 tablet, Rfl: 3   amphetamine-dextroamphetamine (ADDERALL) 10 MG tablet, Take 1 tablet (10 mg total) by mouth daily with breakfast. (Patient taking differently: Take 10 mg by mouth daily as needed (Focus).), Disp: 30 tablet, Rfl: 0   aspirin EC 81 MG tablet, Take 1 tablet (81 mg total) by mouth daily., Disp: 90 tablet, Rfl: 3   cetirizine (ZYRTEC) 10 MG tablet, Take 20 mg by mouth daily as needed for allergies ([place])., Disp: , Rfl:    clonazePAM (KLONOPIN) 0.5 MG tablet, TAKE 1 TABLET(0.5 MG) BY MOUTH THREE TIMES DAILY AS NEEDED FOR ANXIETY, Disp: 30 tablet, Rfl: 3   enalapril (VASOTEC) 10 MG tablet, Take 1 tablet (10 mg total) by mouth 2 (two) times daily., Disp: 60 tablet, Rfl: 1   escitalopram (LEXAPRO) 20 MG tablet, Take 2 tablets (40 mg total) by mouth daily., Disp: 180 tablet, Rfl: 3    ezetimibe (ZETIA) 10 MG tablet, Take 1 tablet (10 mg total) by mouth daily., Disp: 90 tablet, Rfl: 3   gabapentin (NEURONTIN) 300 MG capsule, Take 1 capsule (300 mg total) by mouth 3 (three) times daily., Disp: 270 capsule, Rfl: 3   isosorbide mononitrate (IMDUR) 30 MG 24 hr tablet, Take 1 tablet (30 mg total) by mouth daily., Disp: 90 tablet, Rfl: 2   meclizine (ANTIVERT) 25 MG tablet, Take 1 tablet (25 mg total) by mouth 3 (three) times daily as needed for dizziness. (Patient not taking: Reported on 02/02/2022), Disp: 30 tablet, Rfl: 0   metFORMIN (GLUCOPHAGE-XR) 750 MG 24 hr tablet, Take 1 tablet (750 mg total) by mouth daily with breakfast., Disp: 30 tablet, Rfl: 11   nicotine (NICODERM CQ - DOSED IN MG/24 HOURS) 21 mg/24hr patch, Place 1 patch (21 mg total) onto the skin daily., Disp: 28 patch, Rfl: 3   nitroGLYCERIN (NITROSTAT) 0.4 MG SL tablet, Place 1 tablet (0.4 mg total) under the tongue every 5 (five) minutes as needed for chest pain., Disp: 25 tablet, Rfl: 3   omeprazole (PRILOSEC) 40 MG capsule, Take 1 capsule (40 mg total) by mouth in the morning and at bedtime., Disp: 60 capsule, Rfl: 3   rosuvastatin (CRESTOR) 20 MG tablet, Take 1 tablet (20 mg total) by mouth daily., Disp: 90 tablet, Rfl:  3   Semaglutide (RYBELSUS) 3 MG TABS, Take 3 mg by mouth daily., Disp: 30 tablet, Rfl: 0   Semaglutide (RYBELSUS) 7 MG TABS, Take 7 mg by mouth daily., Disp: 30 tablet, Rfl: 11  Allergies:  Allergies  Allergen Reactions   Macrobid [Nitrofurantoin] Hives and Rash   Other Hives    Preservative used in vaccinations   Tetanus Toxoids Swelling    Pt states she was told the Tdap injection would have to be administered in the hospital  After discussion on 11/10 pt remembers being told it was thimersol in the tetanus 30+years ago that caused the swelling.     Thimerosal (Thiomersal) Rash    Past Medical History, Surgical history, Social history, and Family History were reviewed and  updated.  Review of Systems: Review of Systems  Constitutional: Negative.   HENT:  Negative.    Eyes: Negative.   Respiratory: Negative.    Cardiovascular: Negative.   Gastrointestinal: Negative.   Endocrine: Negative.   Genitourinary: Negative.    Musculoskeletal: Negative.   Skin: Negative.   Neurological: Negative.   Hematological: Negative.   Psychiatric/Behavioral: Negative.      Physical Exam:  height is '5\' 6"'$  (1.676 m) and weight is 213 lb (96.6 kg). Her oral temperature is 98.6 F (37 C). Her blood pressure is 86/52 (abnormal) and her pulse is 70. Her respiration is 16 and oxygen saturation is 98%.   Wt Readings from Last 3 Encounters:  02/23/22 213 lb (96.6 kg)  02/15/22 217 lb (98.4 kg)  02/10/22 217 lb (98.4 kg)    Physical Exam Vitals reviewed.  HENT:     Head: Normocephalic and atraumatic.  Eyes:     Pupils: Pupils are equal, round, and reactive to light.  Cardiovascular:     Rate and Rhythm: Normal rate and regular rhythm.     Heart sounds: Normal heart sounds.  Pulmonary:     Effort: Pulmonary effort is normal.     Breath sounds: Normal breath sounds.  Abdominal:     General: Bowel sounds are normal.     Palpations: Abdomen is soft.  Musculoskeletal:        General: No tenderness or deformity. Normal range of motion.     Cervical back: Normal range of motion.  Lymphadenopathy:     Cervical: No cervical adenopathy.  Skin:    General: Skin is warm and dry.     Findings: No erythema or rash.  Neurological:     Mental Status: She is alert and oriented to person, place, and time.  Psychiatric:        Behavior: Behavior normal.        Thought Content: Thought content normal.        Judgment: Judgment normal.     Lab Results  Component Value Date   WBC 11.2 (H) 02/23/2022   HGB 14.3 02/23/2022   HCT 43.9 02/23/2022   MCV 91.3 02/23/2022   PLT 316 02/23/2022     Chemistry      Component Value Date/Time   NA 142 02/23/2022 0918   NA 141  12/06/2021 1138   K 4.0 02/23/2022 0918   CL 107 02/23/2022 0918   CO2 27 02/23/2022 0918   BUN 16 02/23/2022 0918   BUN 13 12/06/2021 1138   CREATININE 1.12 (H) 02/23/2022 0918   CREATININE 1.09 (H) 11/03/2021 0000   GLU 78 02/29/2012 0000      Component Value Date/Time   CALCIUM 9.2 02/23/2022 0918  ALKPHOS 73 02/23/2022 0918   AST 17 02/23/2022 0918   ALT 30 02/23/2022 0918   BILITOT 0.4 02/23/2022 0918     I looked at her blood smear.  Her white blood cells appear mature.  I did not see any immature myeloid cells.  There are no blasts.  She had no hypersegmented polys.  Red cells appear mature.  There is no rouleaux formation.  There are no nucleated red blood cells.  Platelets are adequate number and size.    Impression and Plan: Sarah Nicholson is a very charming 51 year old white female.  She has mild leukocytosis.  I suspect this is a leukemoid reaction.  Her blood smear which confirmed this.  At this point, we will plan for another follow-up in 6 months.  I would like to think that her ferritin was good and 6 months with no changes, we can probably get her back yearly.  Again I suspect this is a leukemoid reaction.  She is not smoking any longer so this I think will help her white blood cells.  It is always a lot of fun talking to her.  She is truly festive and in the Christmas spirit.     Volanda Napoleon, MD 12/13/202310:05 AM

## 2022-02-24 NOTE — Progress Notes (Addendum)
 Office Visit    Patient Name: Sarah Nicholson Date of Encounter: 02/24/2022  Primary Care Provider:  Gean Keels, MD Primary Cardiologist:  None Primary Electrophysiologist: None  Chief Complaint    Sarah Nicholson is a 51 y.o. female with PMH of HTN, HLD, ADD, anxiety, obesity, tobacco abuse, depression who presents today    Past Medical History    Past Medical History:  Diagnosis Date   ADD (attention deficit disorder) 06/14/2012   Anxiety    Bronchitis    Complication of anesthesia 1987   states woke up during appedentomy   Depression 06/14/2012   Hepatic steatosis 11/13/2018   Hepatic steatosis with hepatomegaly seen on MRI abd on 11/2018   History of leukocytosis 06/14/2012   Reports "WBC 32" decades ago with negative hematology specialist workup.    Hyperlipidemia 06/14/2012   Hypertension    Seasonal allergies 06/14/2012   Past Surgical History:  Procedure Laterality Date   APPENDECTOMY     BICEPT TENODESIS Right 10/10/2019   Procedure: BICEPS TENODESIS;  Surgeon: Micheline Ahr, MD;  Location: [facility];  Service: Orthopedics;  Laterality: Right;   bladder sling/mesh     INGUINAL HERNIA REPAIR     intrauterine ablation     LEFT HEART CATH AND CORONARY ANGIOGRAPHY N/A 12/09/2021   Procedure: LEFT HEART CATH AND CORONARY ANGIOGRAPHY;  Surgeon: Arty Binning, MD;  Location: [facility];  Service: Cardiovascular;  Laterality: N/A;   NASAL SINUS SURGERY     TUBAL LIGATION     VISCERAL ANGIOGRAPHY N/A 02/04/2022   Procedure: VISCERAL ANGIOGRAPHY/Mesentric;  Surgeon: Young Hensen, MD;  Location: [facility];  Service: Cardiovascular;  Laterality: N/A;    Allergies  Allergies  Allergen Reactions   Macrobid [Nitrofurantoin] Hives and Rash   Other Hives    Preservative used in vaccinations   Tetanus Toxoids Swelling    Pt states she was told the Tdap injection would have to be administered in the  hospital  After discussion on 11/10 pt remembers being told it was thimersol in the tetanus 30+years ago that caused the swelling.     Thimerosal (Thiomersal) Rash    History of Present Illness    Sarah Nicholson  is a 51 year old female with the above mention past medical history who presents today for posthospital follow-up for complaint of chest pain.  [name] was initially seen by Dr. Ardell Beauvais for complaint of chest pain and dizziness.  She underwent a exercise treadmill test on 11/30/2021 with 7.1 METS achieved with ST depression and frequent PVCs concerning for possible ischemia.  During her test patient's blood pressure was noted as 204/110.  She endorsed presyncope but has not passed out from her symptoms.  She reported occasional lower extremity edema and shortness of breath at night.  She is working on smoking cessation.  Based on her stress test findings patient was sent for [facility] that was performed by Dr. Felipe Horton on 12/08/2021 and revealed nonobstructive CAD with plan to consider possible work-up microvascular dysfunction (ANOCA).  Patient was also recommended to have better blood pressure control. She was seen in the ED at [facility] on 12/21/2021 for complaint of chest pain.  Patient endorsed intermittent chest pain that radiated into her left arm.  She reported pain was relieved with nitroglycerin  and denied any shortness of breath.  EKG was completed with no evidence of acute ischemia but did  note nonspecific T wave changes.  Chest x-ray was normal and labs were notable for mild leukocytosis.  Troponins were negative and patient was discharged following second trending troponin.  She was seen by Dr. Ardell Beauvais 01/31/2022 for follow-up.  She continues to experience chest discomfort and use for nitro prior to visit.  She reports her chest discomfort typically occurs in the afternoon but she reported 1 episode that occurred in the evening.  She noted associated shortness of  breath at times.  She experienced RLE edema and issues and 21 mg nicotine  patches for smoking cessation.  She was started on Imdur  30 mg.   [name] presents today for follow-up of elevated heart rate and labile blood pressures.  Since last being seen in the office patient reports she has been doing much better but has complaints of elevated heart rate that has increased to 213 bpm but has not been sustained.  Her blood pressures have been labile in the mid 80s and as high as the 150s over 80s.  She has been compliant with her current medication regimen and denies any adverse reactions.  She reports that her Imdur  has controlled her chest pain since last week.  She notes that her elevated heart rate is occurring mainly at work and does not occur frequently at home.  Patient denies chest pain, palpitations, dyspnea, PND, orthopnea, nausea, vomiting, dizziness, syncope, edema, weight gain, or early satiety.   Home Medications    Current Outpatient Medications  Medication Sig Dispense Refill   amLODipine  (NORVASC ) 5 MG tablet Take 1 tablet (5 mg total) by mouth daily. 90 tablet 3   amphetamine -dextroamphetamine  (ADDERALL) 10 MG tablet Take 1 tablet (10 mg total) by mouth daily with breakfast. (Patient taking differently: Take 10 mg by mouth daily as needed (Focus).) 30 tablet 0   aspirin  EC 81 MG tablet Take 1 tablet (81 mg total) by mouth daily. 90 tablet 3   cetirizine  (ZYRTEC ) 10 MG tablet Take 20 mg by mouth daily as needed for allergies (Spring).     clonazePAM  (KLONOPIN ) 0.5 MG tablet TAKE 1 TABLET(0.5 MG) BY MOUTH THREE TIMES DAILY AS NEEDED FOR ANXIETY 30 tablet 3   enalapril  (VASOTEC ) 10 MG tablet Take 1 tablet (10 mg total) by mouth 2 (two) times daily. 60 tablet 1   escitalopram  (LEXAPRO ) 20 MG tablet Take 2 tablets (40 mg total) by mouth daily. 180 tablet 3   ezetimibe  (ZETIA ) 10 MG tablet Take 1 tablet (10 mg total) by mouth daily. 90 tablet 3   gabapentin  (NEURONTIN ) 300 MG capsule  Take 1 capsule (300 mg total) by mouth 3 (three) times daily. 270 capsule 3   isosorbide  mononitrate (IMDUR ) 30 MG 24 hr tablet Take 1 tablet (30 mg total) by mouth daily. 90 tablet 2   meclizine  (ANTIVERT ) 25 MG tablet Take 1 tablet (25 mg total) by mouth 3 (three) times daily as needed for dizziness. (Patient not taking: Reported on 02/02/2022) 30 tablet 0   metFORMIN  (GLUCOPHAGE -XR) 750 MG 24 hr tablet Take 1 tablet (750 mg total) by mouth daily with breakfast. 30 tablet 11   nicotine  (NICODERM CQ  - DOSED IN MG/24 HOURS) 21 mg/24hr patch Place 1 patch (21 mg total) onto the skin daily. 28 patch 3   nitroGLYCERIN  (NITROSTAT ) 0.4 MG SL tablet Place 1 tablet (0.4 mg total) under the tongue every 5 (five) minutes as needed for chest pain. 25 tablet 3   omeprazole  (PRILOSEC) 40 MG capsule Take 1 capsule (40 mg  total) by mouth in the morning and at bedtime. 60 capsule 3   rosuvastatin  (CRESTOR ) 20 MG tablet Take 1 tablet (20 mg total) by mouth daily. 90 tablet 3   Semaglutide  (RYBELSUS ) 3 MG TABS Take 3 mg by mouth daily. 30 tablet 0   Semaglutide  (RYBELSUS ) 7 MG TABS Take 7 mg by mouth daily. 30 tablet 11   No current facility-administered medications for this visit.     Review of Systems  Please see the history of present illness.    (+) Tachycardia (+) Anxiety  All other systems reviewed and are otherwise negative except as noted above.  Physical Exam    Wt Readings from Last 3 Encounters:  02/23/22 213 lb (96.6 kg)  02/15/22 217 lb (98.4 kg)  02/10/22 217 lb (98.4 kg)   RU:EAVWU were no vitals filed for this visit.,There is no height or weight on file to calculate BMI.  Constitutional:      Appearance: Healthy appearance. Not in distress.  Neck:     Vascular: JVD normal.  Pulmonary:     Effort: Pulmonary effort is normal.     Breath sounds: No wheezing. No rales. Diminished in the bases Cardiovascular:     Normal rate. Regular rhythm. Normal S1. Normal S2.      Murmurs: There  is no murmur.  Edema:    Peripheral edema absent.  Abdominal:     Palpations: Abdomen is soft non tender. There is no hepatomegaly.  Skin:    General: Skin is warm and dry.  Neurological:     General: No focal deficit present.     Mental Status: Alert and oriented to person, place and time.     Cranial Nerves: Cranial nerves are intact.  EKG/LABS/Other Studies Reviewed    ECG personally reviewed by me today -sinus rhythm with nonspecific T wave changes and rate of 69 bpm with no acute changes consistent with previous EKG.   Lab Results  Component Value Date   WBC 11.2 (H) 02/23/2022   HGB 14.3 02/23/2022   HCT 43.9 02/23/2022   MCV 91.3 02/23/2022   PLT 316 02/23/2022   Lab Results  Component Value Date   CREATININE 1.12 (H) 02/23/2022   BUN 16 02/23/2022   NA 142 02/23/2022   K 4.0 02/23/2022   CL 107 02/23/2022   CO2 27 02/23/2022   Lab Results  Component Value Date   ALT 30 02/23/2022   AST 17 02/23/2022   ALKPHOS 73 02/23/2022   BILITOT 0.4 02/23/2022   Lab Results  Component Value Date   CHOL 153 02/01/2022   HDL 39 (L) 02/01/2022   LDLCALC 81 02/01/2022   TRIG 195 (H) 02/01/2022   CHOLHDL 3.9 02/01/2022    Lab Results  Component Value Date   HGBA1C 6.8 (A) 02/15/2022    Assessment & Plan    1.  Nonobstructive CAD: -s/p LHC with normal coronaries with possible angina with nonobstructive CAD -Today patient reports no current chest pain since beginning Imdur  30 mg.  She does note labile blood pressures and we will decrease Imdur  to 15 mg with 15 mg as needed.   2.  Chest pain:  -She reports control and resolution of chest pain since beginning Imdur .  She was advised to take additional dose of Imdur  if chest pain was to recur prior to using Nitrostat .   3.  Essential hypertension:  -Patient's blood pressure today was well controlled at 122/60 -She endorses pressures that are up and down  at home in the 170s and 180s systolically. -Continue enalapril   10 mg twice daily and patient will take amlodipine  in the evenings with Imdur  reduced to 15 mg daily    4.  Hyperlipidemia: -Patient's LDL was 136 and was started on Crestor  during previous office visit -She will need repeat LFTs and lipids in 6 weeks -Continue Crestor  20 mg daily   5.  Tobacco abuse: -Cessation was encouraged   6.  Shortness of breath with exertion: -Patient reports shortness of breath that occurs with exertion that also is accompanied with palpitations. -She reports heart rates and bouts of tachycardia in the 200s. -We will order Zio patch 14 days to evaluate tachycardia and rule out further arrhythmia.  Disposition: Follow-up with None as scheduled    Medication Adjustments/Labs and Tests Ordered: Current medicines are reviewed at length with the patient today.  Concerns regarding medicines are outlined above.   Signed, Francene Ing, Retha Cast, NP 02/24/2022, 8:51 PM [facility]  Note:  This document was prepared using Dragon voice recognition software and may include unintentional dictation errors.

## 2022-02-24 NOTE — Progress Notes (Unsigned)
   [facility]  HOME SLEEP TEST (Watch PAT) REPORT  STUDY DATE: 02/22/22  DOB: 1970-03-29  MRN: [id number]  ORDERING CLINICIAN: Huston Foley, MD, PhD   REFERRING CLINICIAN: Dr. Marjory Lies  CLINICAL INFORMATION/HISTORY: 51 year old female with an underlying medical history of ADD, migraine headaches, hypertension, hyperlipidemia, allergies, depression, anxiety, hepatic steatosis, dizziness, and obesity, who reports snoring and excessive daytime somnolence.   Epworth sleepiness score: 11/24.  BMI: 34.7 kg/m  FINDINGS:   Sleep Summary:   Total Recording Time (hours, min): 9 hours, 22 min  Total Sleep Time (hours, min):  6 hours, 56 min  Percent REM (%):    15.4%   Respiratory Indices:   Calculated pAHI (per hour):  20.7/hour         REM pAHI:    26.5/hour       NREM pAHI: 19.6/hour  Central pAHI: 2.9/hour  Oxygen Saturation Statistics:    Oxygen Saturation (%) Mean: 93%   Minimum oxygen saturation (%):                 85%   O2 Saturation Range (%): 85 - 98%    O2 Saturation (minutes) <=88%: 0.6 min  Pulse Rate Statistics:   Pulse Mean (bpm):    61/min    Pulse Range (51 - 81/min)   IMPRESSION: OSA (obstructive sleep apnea)   RECOMMENDATION:  ***   INTERPRETING PHYSICIAN:   Huston Foley, MD, PhD Medical Director, [facility]) Diplomat, ABPN (Neurology and Sleep)   [facility] [address], [address] [place], [place] [address] [phone]

## 2022-02-25 ENCOUNTER — Encounter: Payer: Self-pay | Admitting: Nurse Practitioner

## 2022-02-25 ENCOUNTER — Ambulatory Visit (INDEPENDENT_AMBULATORY_CARE_PROVIDER_SITE_OTHER): Payer: 59

## 2022-02-25 ENCOUNTER — Ambulatory Visit: Payer: 59 | Attending: Nurse Practitioner | Admitting: Nurse Practitioner

## 2022-02-25 VITALS — BP 122/60 | HR 69 | Ht 66.0 in | Wt 218.6 lb

## 2022-02-25 DIAGNOSIS — R000 Tachycardia, unspecified: Secondary | ICD-10-CM

## 2022-02-25 DIAGNOSIS — E782 Mixed hyperlipidemia: Secondary | ICD-10-CM | POA: Diagnosis not present

## 2022-02-25 DIAGNOSIS — R079 Chest pain, unspecified: Secondary | ICD-10-CM

## 2022-02-25 DIAGNOSIS — Z79899 Other long term (current) drug therapy: Secondary | ICD-10-CM | POA: Diagnosis not present

## 2022-02-25 DIAGNOSIS — I2510 Atherosclerotic heart disease of native coronary artery without angina pectoris: Secondary | ICD-10-CM

## 2022-02-25 DIAGNOSIS — Z720 Tobacco use: Secondary | ICD-10-CM

## 2022-02-25 MED ORDER — ISOSORBIDE MONONITRATE ER 30 MG PO TB24: 15.0000 mg | ORAL_TABLET | Freq: Every day | ORAL | 2 refills | Status: DC

## 2022-02-25 MED ORDER — AMLODIPINE BESYLATE 5 MG PO TABS: 5.0000 mg | ORAL_TABLET | Freq: Every evening | ORAL | 3 refills | Status: DC

## 2022-02-25 NOTE — Patient Instructions (Addendum)
Medication Instructions:  CHANGE: Take Norvasc (Amlodipine) at night DECREASE Imdur '15mg'$   once a day; you can take an additional '15mg'$  tablet as needed for chest pain *If you need a refill on your cardiac medications before your next appointment, please call your pharmacy*  Lab Work: None Ordered  Testing/Procedures: Bryn Gulling- Long Term Monitor Instructions  Your physician has requested you wear a ZIO patch monitor for 14 days.  This is a single patch monitor. [employer] supplies one patch monitor per enrollment. Additional stickers are not available. Please do not apply patch if you will be having a Nuclear Stress Test,  Echocardiogram, Cardiac CT, MRI, or Chest Xray during the period you would be wearing the  monitor. The patch cannot be worn during these tests. You cannot remove and re-apply the  ZIO XT patch monitor.  Your ZIO patch monitor will be mailed 3 day USPS to your address on file. It may take 3-5 days  to receive your monitor after you have been enrolled.  Once you have received your monitor, please review the enclosed instructions. Your monitor  has already been registered assigning a specific monitor serial # to you.  Billing and Patient Assistance Program Information  We have supplied [employer] with any of your insurance information on file for billing purposes. [employer] offers a sliding scale Patient Assistance Program for patients that do not have  insurance, or whose insurance does not completely cover the cost of the ZIO monitor.  You must apply for the Patient Assistance Program to qualify for this discounted rate.  To apply, please call [employer] at [phone], select option 4, select option 2, ask to apply for  Patient Assistance Program. Theodore Demark will ask your household income, and how many people  are in your household. They will quote your out-of-pocket cost based on that information.  [employer] will also be able to set up a 63-month interest-free payment plan if  needed.  Applying the monitor   Shave hair from upper left chest.  Hold abrader disc by orange tab. Rub abrader in 40 strokes over the upper left chest as  indicated in your monitor instructions.  Clean area with 4 enclosed alcohol pads. Let dry.  Apply patch as indicated in monitor instructions. Patch will be placed under collarbone on left  side of chest with arrow pointing upward.  Rub patch adhesive wings for 2 minutes. Remove white label marked "1". Remove the white  label marked "2". Rub patch adhesive wings for 2 additional minutes.  While looking in a mirror, press and release button in center of patch. A small green light will  flash 3-4 times. This will be your only indicator that the monitor has been turned on.  Do not shower for the first 24 hours. You may shower after the first 24 hours.  Press the button if you feel a symptom. You will hear a small click. Record Date, Time and  Symptom in the Patient Logbook.  When you are ready to remove the patch, follow instructions on the last 2 pages of Patient  Logbook. Stick patch monitor onto the last page of Patient Logbook.  Place Patient Logbook in the blue and white box. Use locking tab on box and tape box closed  securely. The blue and white box has prepaid postage on it. Please place it in the mailbox as  soon as possible. Your physician should have your test results approximately 7 days after the  monitor has been [facility]  Call [place] at [phone] if you have questions regarding  your ZIO XT patch monitor. Call them immediately if you see an orange light blinking on your  monitor.  If your monitor falls off in less than 4 days, contact our Monitor department at [phone].  If your monitor becomes loose or falls off after 4 days call [employer] at [phone] for  suggestions on securing your monitor    Follow-Up: At [facility], you and your health needs are  our priority.  As part of our continuing mission to provide you with exceptional heart care, we have created designated Provider Care Teams.  These Care Teams include your primary Cardiologist (physician) and Advanced Practice Providers (APPs -  Physician Assistants and Nurse Practitioners) who all work together to provide you with the care you need, when you need it.  We recommend signing up for the patient portal called "MyChart".  Sign up information is provided on this After Visit Summary.  MyChart is used to connect with patients for Virtual Visits (Telemedicine).  Patients are able to view lab/test results, encounter notes, upcoming appointments, etc.  Non-urgent messages can be sent to your provider as well.   To learn more about what you can do with MyChart, go to [email or web address].    Your next appointment:   FOLLOW UP AS SCHEDULED   The format for your next appointment:   In Person  Provider:   Richardson Dopp, PA-C       Other Instructions   Important Information About Sugar

## 2022-02-25 NOTE — Progress Notes (Unsigned)
Enrolled patient for a 14 day Zio XT monitor to be mailed to patients home   Dr Johney Frame to read

## 2022-02-28 NOTE — Addendum Note (Signed)
Addended by: Star Age on: 02/28/2022 04:33 PM   Modules accepted: Orders

## 2022-02-28 NOTE — Procedures (Signed)
   [facility]  HOME SLEEP TEST (Watch PAT) REPORT  STUDY DATE: 02/22/22  DOB: 1970/03/30  MRN: [id number]  ORDERING CLINICIAN: Star Age, MD, PhD   REFERRING CLINICIAN: Dr. Leta Baptist  CLINICAL INFORMATION/HISTORY: 51 year old female with an underlying medical history of ADD, migraine headaches, hypertension, hyperlipidemia, allergies, depression, anxiety, hepatic steatosis, dizziness, and obesity, who reports snoring and excessive daytime somnolence.   Epworth sleepiness score: 11/24.  BMI: 34.7 kg/m  FINDINGS:   Sleep Summary:   Total Recording Time (hours, min): 9 hours, 22 min  Total Sleep Time (hours, min):  6 hours, 56 min  Percent REM (%):    15.4%   Respiratory Indices:   Calculated pAHI (per hour):  20.7/hour         REM pAHI:    26.5/hour       NREM pAHI: 19.6/hour  Central pAHI: 2.9/hour  Oxygen Saturation Statistics:    Oxygen Saturation (%) Mean: 93%   Minimum oxygen saturation (%):                 85%   O2 Saturation Range (%): 85 - 98%    O2 Saturation (minutes) <=88%: 0.6 min  Pulse Rate Statistics:   Pulse Mean (bpm):    61/min    Pulse Range (51 - 81/min)   IMPRESSION: OSA (obstructive sleep apnea)   RECOMMENDATION:  This home sleep test demonstrates moderate obstructive sleep apnea with a total AHI of 20.7/hour and O2 nadir of 85%.  Mild to moderate snoring was detected. Treatment with a positive airway pressure (PAP) device is recommended. The patient will be advised to proceed with an autoPAP titration/trial at home for now. A full night titration study may be considered to optimize treatment settings, monitor proper oxygen saturations and aid with improvement of tolerance and adherence, if needed down the road. Alternative treatment options may include a dental device through dentistry or orthodontics in selected patients or Inspire (hypoglossal nerve stimulator) in carefully selected patients (meeting inclusion  criteria).  Concomitant weight loss is recommended (where clinically appropriate). Please note that untreated obstructive sleep apnea may carry additional perioperative morbidity. Patients with significant obstructive sleep apnea should receive perioperative PAP therapy and the surgeons and particularly the anesthesiologist should be informed of the diagnosis and the severity of the sleep disordered breathing. The patient should be cautioned not to drive, work at heights, or operate dangerous or heavy equipment when tired or sleepy. Review and reiteration of good sleep hygiene measures should be pursued with any patient. Other causes of the patient's symptoms, including circadian rhythm disturbances, an underlying mood disorder, medication effect and/or an underlying medical problem cannot be ruled out based on this test. Clinical correlation is recommended.  The patient and her referring provider will be notified of the test results. The patient will be seen in follow up in sleep clinic at [facility].  I certify that I have reviewed the raw data recording prior to the issuance of this report in accordance with the standards of the American Academy of Sleep Medicine (AASM).  INTERPRETING PHYSICIAN:   Star Age, MD, PhD Medical Director, [facility] [place] [facility]) [place], ABPN (Neurology and Sleep)   [facility] [address], [place], [place] [address] [phone]

## 2022-03-01 ENCOUNTER — Telehealth: Payer: Self-pay | Admitting: *Deleted

## 2022-03-01 NOTE — Telephone Encounter (Signed)
Called pt & LVM with office number asking for call back.  

## 2022-03-01 NOTE — Telephone Encounter (Signed)
-----   Message from Star Age, MD sent at 02/28/2022  4:33 PM EST ----- Patient referred by Dr. Leta Baptist, seen by me on 12/29/2021, patient had a HST on 02/22/2022.    Please call and notify the patient that the recent home sleep test showed obstructive sleep apnea in the moderate range. I recommend treatment in the form of autoPAP, which means, that we don't have to bring her in for a sleep study with CPAP, but will let her start using a so called autoPAP machine at home, which is a CPAP-like machine with self-adjusting pressures. We will send the order to a local DME company (of her choice, or as per insurance requirement). The DME representative will fit her with a mask, educate her on how to use the machine, how to put the mask on, etc. I have placed an order in the chart. Please send the order, talk to patient, send report to referring MD. We will need a FU in sleep clinic for 10 weeks post-PAP set up, please arrange that with me or one of our NPs. Also reinforce the need for compliance with treatment. Thanks,   Star Age, MD, PhD [facility])

## 2022-03-02 DIAGNOSIS — R079 Chest pain, unspecified: Secondary | ICD-10-CM

## 2022-03-02 DIAGNOSIS — Z79899 Other long term (current) drug therapy: Secondary | ICD-10-CM | POA: Diagnosis not present

## 2022-03-02 DIAGNOSIS — R000 Tachycardia, unspecified: Secondary | ICD-10-CM | POA: Diagnosis not present

## 2022-03-02 DIAGNOSIS — E782 Mixed hyperlipidemia: Secondary | ICD-10-CM

## 2022-03-02 DIAGNOSIS — I2510 Atherosclerotic heart disease of native coronary artery without angina pectoris: Secondary | ICD-10-CM

## 2022-03-02 LAB — HM DIABETES EYE EXAM

## 2022-03-02 NOTE — Telephone Encounter (Signed)
Spoke with patient and discussed her sleep study results. Patient is amenable to proceeding with autopap therapy. We discussed difference between autopap and cpap. Pt is aware of the insurance compliance requirements which includes using the machine at least 4 hours at night and also being seen in our office for an initial follow-up appointment between 30 and 90 days after set-up. Patient did not have a preference of DME company. Pt will watch for a call from [employer] within approx 1 week. She will also watch for a call from our office within the next few days to schedule the initial follow-up.  Order sent to [employer]. Result also sent to referring provider. Pt needs an initial autopap appt scheduled.

## 2022-03-03 NOTE — Telephone Encounter (Signed)
Called pt. Scheduled initial autopap appt with Dr. Rexene Alberts on 4/18 @ 1:15pm. Informed pt to bring machine and power cord to this appointment.

## 2022-03-03 NOTE — Telephone Encounter (Signed)
[employer] confirmed receipt of order.  

## 2022-03-03 NOTE — Telephone Encounter (Signed)
ok 

## 2022-03-09 ENCOUNTER — Encounter: Payer: Self-pay | Admitting: Sports Medicine

## 2022-03-18 ENCOUNTER — Other Ambulatory Visit (INDEPENDENT_AMBULATORY_CARE_PROVIDER_SITE_OTHER): Payer: 59

## 2022-03-18 ENCOUNTER — Encounter: Payer: Self-pay | Admitting: Physician Assistant

## 2022-03-18 ENCOUNTER — Ambulatory Visit (INDEPENDENT_AMBULATORY_CARE_PROVIDER_SITE_OTHER): Payer: 59 | Admitting: Physician Assistant

## 2022-03-18 VITALS — BP 110/78 | HR 65 | Ht 66.0 in | Wt 211.0 lb

## 2022-03-18 DIAGNOSIS — R110 Nausea: Secondary | ICD-10-CM

## 2022-03-18 DIAGNOSIS — Z1211 Encounter for screening for malignant neoplasm of colon: Secondary | ICD-10-CM

## 2022-03-18 DIAGNOSIS — R1012 Left upper quadrant pain: Secondary | ICD-10-CM

## 2022-03-18 LAB — CBC WITH DIFFERENTIAL/PLATELET
Basophils Absolute: 0.1 10*3/uL (ref 0.0–0.1)
Basophils Relative: 0.7 % (ref 0.0–3.0)
Eosinophils Absolute: 0.1 10*3/uL (ref 0.0–0.7)
Eosinophils Relative: 1.2 % (ref 0.0–5.0)
HCT: 42.3 % (ref 36.0–46.0)
Hemoglobin: 14.2 g/dL (ref 12.0–15.0)
Lymphocytes Relative: 27.7 % (ref 12.0–46.0)
Lymphs Abs: 2.7 10*3/uL (ref 0.7–4.0)
MCHC: 33.5 g/dL (ref 30.0–36.0)
MCV: 88.4 fl (ref 78.0–100.0)
Monocytes Absolute: 0.7 10*3/uL (ref 0.1–1.0)
Monocytes Relative: 7.5 % (ref 3.0–12.0)
Neutro Abs: 6.2 10*3/uL (ref 1.4–7.7)
Neutrophils Relative %: 62.9 % (ref 43.0–77.0)
Platelets: 305 10*3/uL (ref 150.0–400.0)
RBC: 4.79 Mil/uL (ref 3.87–5.11)
RDW: 13.8 % (ref 11.5–15.5)
WBC: 9.8 10*3/uL (ref 4.0–10.5)

## 2022-03-18 LAB — C-REACTIVE PROTEIN: CRP: <1 mg/dL (ref 0.5–20.0)

## 2022-03-18 LAB — SEDIMENTATION RATE: Sed Rate: 20 mm/hr (ref 0–30)

## 2022-03-18 MED ORDER — HYOSCYAMINE SULFATE SL 0.125 MG SL SUBL: SUBLINGUAL_TABLET | SUBLINGUAL | 4 refills | Status: AC

## 2022-03-18 MED ORDER — NA SULFATE-K SULFATE-MG SULF 17.5-3.13-1.6 GM/177ML PO SOLN: 1.0000 | ORAL | 0 refills | Status: DC

## 2022-03-18 NOTE — Progress Notes (Signed)
Subjective:    Patient ID: Sarah Nicholson, female    DOB: June 13, 1970, 52 y.o.   MRN: [id number]  HPI Sarah Nicholson is a pleasant 52 year old female, new to GI today referred by Dr. Apolonio Schneiders surgery for evaluation of recurrent left upper quadrant postprandial pain. She relates having undergone remote GI evaluation with EGD and colonoscopy about 20 years ago done while living in [place] and says that workup was negative, no polyps. She had also been seen by [facility] in 2018, Dr. Vicente Males with similar left upper quadrant pain, she did not have any endoscopic evaluation or imaging.  Consideration was given to SIBO and/or aerophagia.  She was given a trial of IBgard and a low FODMAP diet. She says that she has had her current symptoms for many years and that the symptoms have recurred about once per week over the past 15 to 20 years usually within 1 hour of eating.  She says the pain is sometimes severe and doubles her over and may last for couple of hours.  She will get nauseated with this pain but does not have any vomiting, usually the episodes are associated with diarrhea which may last for 1 to 2 days.  She will have associated abdominal bloating.  She did try a low FODMAP diet and says the only things that she can definitely say exacerbate her symptoms are apples, chicken liver, garlic, mushrooms and pepperoni. In between these episodes her bowel movements are normal, she does not note any melena or hematochezia. 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  She underwent mesenteric arteriogram per Dr. Carlis Abbott on 02/04/2022 showing the SMA to be widely patent with no stenosis and the celiac had a slightly oblique orientation off the abdominal aorta looked at in multiple views and ultimately did not see any significant stenosis any downward projecting artery that was also widely patent, no intervention  performed.  Labs 02/23/2022 c-Met normal with the exception of creatinine 1.12 WBC 11.2/hemoglobin 14.3/hematocrit 43.9/MCV 91/platelets 316  Patient says she has had a chronically elevated WBC and has been evaluated by Dr. Marin Olp in the past and observation recommended at this point.    Review of Systems Pertinent positive and negative review of systems were noted in the above HPI section.  All other review of systems was otherwise negative.   Outpatient Encounter Medications as of 03/18/2022  Medication Sig   amLODipine (NORVASC) 5 MG tablet Take 1 tablet (5 mg total) by mouth at bedtime.   amphetamine-dextroamphetamine (ADDERALL) 10 MG tablet Take 10 mg by mouth as needed (for focus).   aspirin EC 81 MG tablet Take 1 tablet (81 mg total) by mouth daily.   cetirizine (ZYRTEC) 10 MG tablet Take 20 mg by mouth daily as needed for allergies ([place]).   clonazePAM (KLONOPIN) 0.5 MG tablet TAKE 1 TABLET(0.5 MG) BY MOUTH THREE TIMES DAILY AS NEEDED FOR ANXIETY   enalapril (VASOTEC) 10 MG tablet Take 1 tablet (10 mg total) by mouth 2 (two) times daily.   escitalopram (LEXAPRO) 20 MG tablet Take 2 tablets (40 mg total) by mouth daily.   ezetimibe (ZETIA) 10 MG tablet Take 1 tablet (10 mg total) by mouth daily.   gabapentin (NEURONTIN) 300 MG capsule Take 1 capsule (300 mg total) by mouth 3 (three) times daily.   Hyoscyamine Sulfate SL (LEVSIN/SL) 0.125 MG SUBL Dissolve one tablet every 4-6 hours as needed  for abdominal pain/spasms   isosorbide mononitrate (IMDUR) 30 MG 24 hr tablet Take 0.5 tablets (15 mg total) by mouth daily. CAN TAKE AN ADDITIONAL HALF TABLET AS NEEDED FOR CHEST PAIN   metFORMIN (GLUCOPHAGE-XR) 750 MG 24 hr tablet Take 1 tablet (750 mg total) by mouth daily with breakfast.   Na Sulfate-K Sulfate-Mg Sulf 17.5-3.13-1.6 GM/177ML SOLN Take 1 kit by mouth as directed.   nicotine (NICODERM CQ - DOSED IN MG/24 HOURS) 21 mg/24hr patch Place 1 patch (21 mg total) onto the skin daily.    nitroGLYCERIN (NITROSTAT) 0.4 MG SL tablet Place 1 tablet (0.4 mg total) under the tongue every 5 (five) minutes as needed for chest pain.   omeprazole (PRILOSEC) 40 MG capsule Take 1 capsule (40 mg total) by mouth in the morning and at bedtime.   rosuvastatin (CRESTOR) 20 MG tablet Take 1 tablet (20 mg total) by mouth daily.   Semaglutide (RYBELSUS) 3 MG TABS Take 3 mg by mouth daily.   Semaglutide (RYBELSUS) 7 MG TABS Take 7 mg by mouth daily.   No facility-administered encounter medications on file as of 03/18/2022.   Allergies  Allergen Reactions   Macrobid [Nitrofurantoin] Hives and Rash   Other Hives    Preservative used in vaccinations   Tetanus Toxoids Swelling    Pt states she was told the Tdap injection would have to be administered in the hospital  After discussion on 11/10 pt remembers being told it was thimersol in the tetanus 30+years ago that caused the swelling.     Thimerosal (Thiomersal) Rash   Patient Active Problem List   Diagnosis Date Noted   Controlled type 2 diabetes mellitus without complication, without long-term current use of insulin ([place]) 02/15/2022   Celiac artery stenosis ([place]) 01/25/2022   Superior mesenteric artery stenosis ([place]) 01/25/2022   Abnormal stress test 12/09/2021   Annual physical exam 11/24/2021   Hemiparesis, left ([place]) 11/03/2021   Herpes zoster 08/06/2021   Femoral hernia 03/04/2021   Leukocytosis 01/16/2021   Left lateral abdominal pain 01/15/2021   Lumbar spondylosis 01/15/2021   Trochanteric bursitis, left hip 12/04/2020   Hepatic steatosis 11/13/2018   Tobacco use 07/18/2018   Irritable bowel syndrome (IBS) 05/04/2017   Hallux valgus 12/09/2014   Chest pain of uncertain etiology 44/03/270   Other and unspecified ovarian cyst 06/25/2013   Left tennis elbow 06/25/2013   Microscopic hematuria 06/27/2012   Hyperlipidemia 06/14/2012   Prediabetes 06/14/2012   Seasonal allergies 06/14/2012   Depression 06/14/2012   ADD  (attention deficit disorder) 06/14/2012   Social History   Socioeconomic History   Marital status: Married    Spouse name: Not on file   Number of children: 3   Years of education: Not on file   Highest education level: Not on file  Occupational History   Occupation: [employer]  Tobacco Use   Smoking status: Every Day    Packs/day: 0.25    Types: Cigarettes   Smokeless tobacco: Never   Tobacco comments:    30 years , currently on Chantix  Vaping Use   Vaping Use: Never used  Substance and Sexual Activity   Alcohol use: Not Currently   Drug use: No   Sexual activity: Not on file  Other Topics Concern   Not on file  Social History Narrative   Not on file   Social Determinants of Health   Financial Resource Strain: Not on file  Food Insecurity: Not on file  Transportation Needs: Not on file  Physical Activity: Not on file  Stress: Not on file  Social Connections: Not on file  Intimate Partner Violence: Not on file    Ms. Bryan Hendrix's family history includes Breast cancer in her maternal grandmother; Crohn's disease in her maternal grandmother; Depression in her mother, sister, and son; Diabetes in her father and paternal grandfather; High blood pressure in her paternal grandfather; Hyperlipidemia in her father; Hypertension in her father.      Objective:    Vitals:   03/18/22 0841  BP: 110/78  Pulse: 65  SpO2: 95%    Physical Exam Well-developed well-nourished older white female in no acute distress.  Height, Weight, 211 BMI 34.0  HEENT; nontraumatic normocephalic, EOMI, PE R LA, sclera anicteric. Oropharynx; examined today Neck; supple, no JVD Cardiovascular; regular rate and rhythm with S1-S2, no murmur rub or gallop Pulmonary; Clear bilaterally Abdomen; soft, obese, nontender, nondistended, no palpable mass or hepatosplenomegaly, bowel sounds are active Rectal; not done today Skin; benign exam, no jaundice rash or appreciable lesions Extremities; no  clubbing cyanosis or edema skin warm and dry Neuro/Psych; alert and oriented x4, grossly nonfocal mood and affect appropriate        Assessment & Plan:   #55 52 year old white female with longstanding episodes of left upper quadrant pain occurring about once per week, always within 1 hour postprandial, pain may last several hours sometimes radiating into the scapula and around the abdomen, frequently associated with diarrhea nausea without vomiting.  Associated bloating  In between these episodes bowel movements are normal. Patient does have food intolerances with exacerbation of these episodes with ingestion of garlic mushrooms chicken liver apples and pepperoni.  Very recent mesenteric arteriogram as outlined above done because of concern for SMA syndrome and negative with patent SMA and celiac  Etiology of these episodes is not entirely clear though with exacerbation with certain foods, consider food allergy related symptoms, IBS related symptoms with spasm potentially of the splenic flexure, rule out chronic gastropathy Doubt IBD but cannot entirely rule out  #2 adult onset diabetes mellitus #3.  Hepatic steatosis #4.  Sleep apnea #5.  History of left hemiparesis #6 colon cancer screening-overdue very remote colonoscopy approximately 20 years ago get it  Plan; patient had CT/renal protocol May 2023 unremarkable , Will check sed rate, CRP, TTG and IgA, alpha-gal panel Start trial of Levsin sublingual as needed at onset of episodes/symptoms can be taken every 4 to 6 hours during these episodes Patient will be scheduled for colonoscopy and EGD with Dr. Rush Landmark Both procedures were discussed in detail with the patient including indications risk and benefits and she is agreeable to proceed.   Davon Folta S Purl Claytor PA-C 03/18/2022   Cc: Marty Heck, MD

## 2022-03-18 NOTE — Patient Instructions (Signed)
Your provider has requested that you go to the basement level for lab work before leaving today. Press "B" on the elevator. The lab is located at the first door on the left as you exit the elevator.  Due to recent changes in healthcare laws, you may see the results of your imaging and laboratory studies on MyChart before your provider has had a chance to review them.  We understand that in some cases there may be results that are confusing or concerning to you. Not all laboratory results come back in the same time frame and the provider may be waiting for multiple results in order to interpret others.  Please give [place] 48 hours in order for your provider to thoroughly review all the results before contacting the office for clarification of your results.   We have sent the following medications to your pharmacy for you to pick up at your convenience: [place] have been scheduled for an endoscopy and colonoscopy. Please follow the written instructions given to you at your visit today. Please pick up your prep supplies at the pharmacy within the next 1-3 days. If you use inhalers (even only as needed), please bring them with you on the day of your procedure.   I appreciate the opportunity to care for you. Amy Esterwood PA-C

## 2022-03-19 NOTE — Progress Notes (Signed)
Attending Physician's Attestation   I have reviewed the chart.   I agree with the Advanced Practitioner's note, impression, and recommendations with any updates as below. Chronicity of symptoms and periods between episodes make a mucosal abnormality seem less likely.  Agree with workup as outlined otherwise.  Upper endoscopy for diagnostic purposes and colonoscopy for screening purposes to be performed.   Justice Britain, MD [facility] Advanced Endoscopy Office # [phone]

## 2022-03-22 LAB — TISSUE TRANSGLUTAMINASE, IGA: (tTG) Ab, IgA: <1 U/mL

## 2022-03-22 LAB — IGA: Immunoglobulin A: 167 mg/dL (ref 47–310)

## 2022-03-22 LAB — ALPHA-GAL PANEL
Allergen, Mutton, f88: <0.1 kU/L
Allergen, Pork, f26: <0.1 kU/L
Beef: <0.1 kU/L
CLASS: 0
CLASS: 0
Class: 0
GALACTOSE-ALPHA-1,3-GALACTOSE IGE*: <0.1 kU/L (ref <0.10)

## 2022-03-22 LAB — INTERPRETATION:

## 2022-04-28 NOTE — Progress Notes (Signed)
Cardiology Office Note:    Date:  04/29/2022   ID:  Sarah Nicholson, DOB 1971-01-08, MRN [id number]  PCP:  Silverio Decamp, MD  [facility] Cardiologist:  None    Referring MD: Silverio Decamp,*   Patient Profile: Non-obstructive CAD  INOCA ETT 11/30/2021 exercise 6 minutes 4 seconds; METS 7.1; 2 mm horizontal ST depression 2, 3, aVF, V4-V6 LHC 12/09/2021: LAD irregularities, EF 60  TTE 12/31/2021: EF 60-65, no RWMA, GLS -20, normal RVSF, trivial MR, trivial AI, AV sclerosis, RAP 3 Palpitations Monitor 03/25/2022: PACs, PVCs <1%, no sustained arrhythmias  Hypertension Renal artery [place] 01/11/2022: R RA 1-59; celiac artery and SMA 70-99 Concern for SMA and celiac artery stenosis on [place] 12/2021 Arteriogram (Dr Carlis Abbott) 01/2022 - patent SMA, no significant stenosis in celiac artery Hyperlipidemia  ADD Anxiety/depression  Tobacco use Aortic atherosclerosis  History of Present Illness:   Sarah Nicholson is a 52 y.o. female with the above problem list.  She was last seen in clinic by Ambrose Pancoast, NP 02/25/22.  She noted labile blood pressures at home and her isosorbide dose was decreased to 15 mg.  She returns for follow-up of INOCA (Ischemia with Non-Obstructive Coronary Arteries).  She is here alone.  Since last seen, she has done well without significant issues with her blood pressure.  Her blood pressure has remained fairly stable.  She is taking isosorbide 15 mg twice daily.  She has only had 1 episode of chest discomfort that required nitroglycerin.  Overall, her angina is well-controlled.  She has not had shortness of breath or syncope.   Subjective    Reviewed and updated this encounter:  Tobacco  Allergies  Meds  Problems  Med Hx  Surg Hx  Fam Hx     ROS  Objective   Labs/Other Test Reviewed:   Recent Labs: 11/03/2021: Brain Natriuretic Peptide 6; TSH 1.69 02/23/2022: ALT 30; BUN 16; Creatinine 1.12; Potassium 4.0; Sodium  142 03/18/2022: Hemoglobin 14.2; Platelets 305.0   Recent Lipid Panel Recent Labs    02/01/22 0822  CHOL 153  TRIG 195*  HDL 39*  LDLCALC 81     Risk Assessment/Calculations/Metrics:             Physical Exam:   VS:  BP 126/80   Pulse 74   Ht 5' 6"$  (1.676 m)   Wt 211 lb (95.7 kg)   LMP  (LMP Unknown)   SpO2 96%   BMI 34.06 kg/m    Wt Readings from Last 3 Encounters:  04/29/22 211 lb (95.7 kg)  03/18/22 211 lb (95.7 kg)  02/25/22 218 lb 9.6 oz (99.2 kg)    Constitutional:      Appearance: Healthy appearance. Not in distress.  Neck:     Vascular: No carotid bruit.  Pulmonary:     Breath sounds: Normal breath sounds. No wheezing. No rales.  Cardiovascular:     Normal rate. Regular rhythm. Normal S1. Normal S2.      Murmurs: There is no murmur.  Edema:    Peripheral edema absent.     Assessment & Plan    ASSESSMENT & PLAN:   Angina pectoris [facility]) She has a history of microvascular angina.  She had a positive stress test in September 2023.  Cardiac catheterization demonstrated irregularities but no significant CAD.  She has had some issues with labile blood pressures.  She has adjusted the timing of her medications and her blood pressures are now stable.  Her angina is well-controlled on current therapy which includes amlodipine 5 mg, isosorbide 15 mg twice daily.  She is fasting today.  Obtain follow-up fasting lipids and LFTs.  Continue Zetia 10 mg daily, Crestor 20 mg daily.  Goal LDL <70.  Follow-up with Dr. Johney Frame in 6 months or sooner as needed.          Dispo:  Return in about 6 months (around 10/28/2022) for Routine Follow Up, w/ Dr. Johney Frame.   Signed, Richardson Dopp, PA-C  04/29/2022 8:35 AM    [facility] [address], [place], [place]  [address] Phone: [phone]; Fax: [phone]

## 2022-04-29 ENCOUNTER — Encounter: Payer: Self-pay | Admitting: Physician Assistant

## 2022-04-29 ENCOUNTER — Ambulatory Visit: Payer: 59 | Attending: Physician Assistant | Admitting: Physician Assistant

## 2022-04-29 ENCOUNTER — Other Ambulatory Visit: Payer: Self-pay | Admitting: Cardiology

## 2022-04-29 VITALS — BP 126/80 | HR 74 | Ht 66.0 in | Wt 211.0 lb

## 2022-04-29 DIAGNOSIS — I209 Angina pectoris, unspecified: Secondary | ICD-10-CM

## 2022-04-29 NOTE — Patient Instructions (Signed)
Medication Instructions:  Your physician recommends that you continue on your current medications as directed. Please refer to the Current Medication list given to you today.  *If you need a refill on your cardiac medications before your next appointment, please call your pharmacy*   Lab Work: None ordered today but will get Lipids, previously ordered   If you have labs (blood work) drawn today and your tests are completely normal, you will receive your results only by: [place] (if you have MyChart) OR A paper copy in the mail If you have any lab test that is abnormal or we need to change your treatment, we will call you to review the results.   Testing/Procedures: None ordered   Follow-Up: At [facility], you and your health needs are our priority.  As part of our continuing mission to provide you with exceptional heart care, we have created designated Provider Care Teams.  These Care Teams include your primary Cardiologist (physician) and Advanced Practice Providers (APPs -  Physician Assistants and Nurse Practitioners) who all work together to provide you with the care you need, when you need it.  We recommend signing up for the patient portal called "MyChart".  Sign up information is provided on this After Visit Summary.  MyChart is used to connect with patients for Virtual Visits (Telemedicine).  Patients are able to view lab/test results, encounter notes, upcoming appointments, etc.  Non-urgent messages can be sent to your provider as well.   To learn more about what you can do with MyChart, go to [email or web address].    Your next appointment:   6 month(s)  Provider:   Gwyndolyn Kaufman, MD    Other Instructions

## 2022-04-29 NOTE — Assessment & Plan Note (Signed)
She has a history of microvascular angina.  She had a positive stress test in September 2023.  Cardiac catheterization demonstrated irregularities but no significant CAD.  She has had some issues with labile blood pressures.  She has adjusted the timing of her medications and her blood pressures are now stable.  Her angina is well-controlled on current therapy which includes amlodipine 5 mg, isosorbide 15 mg twice daily.  She is fasting today.  Obtain follow-up fasting lipids and LFTs.  Continue Zetia 10 mg daily, Crestor 20 mg daily.  Goal LDL <70.  Follow-up with Dr. Johney Frame in 6 months or sooner as needed.

## 2022-04-30 LAB — LIPID PANEL
Chol/HDL Ratio: 2.9 ratio (ref 0.0–4.4)
Cholesterol, Total: 107 mg/dL (ref 100–199)
HDL: 37 mg/dL — ABNORMAL LOW (ref >39)
LDL Chol Calc (NIH): 39 mg/dL (ref 0–99)
Triglycerides: 193 mg/dL — ABNORMAL HIGH (ref 0–149)
VLDL Cholesterol Cal: 31 mg/dL (ref 5–40)

## 2022-05-02 ENCOUNTER — Telehealth: Payer: Self-pay | Admitting: *Deleted

## 2022-05-02 DIAGNOSIS — Z79899 Other long term (current) drug therapy: Secondary | ICD-10-CM

## 2022-05-02 DIAGNOSIS — E782 Mixed hyperlipidemia: Secondary | ICD-10-CM

## 2022-05-02 NOTE — Telephone Encounter (Signed)
The patient has been notified of the result and verbalized understanding.  All questions (if any) were answered.  Pt aware to work on her diet and continue with healthy lifestyle modification and exercise.  She is aware that we will repeat lipids in 3 months and if that time her TG's are still elevated, then we will start her vascepa thereafter.   Scheduled the pt repeat lipids in 3 months on 08/01/22.  She is aware to come fasting to this lab appt.  Pt verbalized understanding and agrees with this plan.

## 2022-05-02 NOTE — Telephone Encounter (Signed)
-----   Message from Freada Bergeron, MD sent at 05/02/2022  8:09 AM EST ----- Her cholesterol looks amazing. TG are elevated. Will continued with diet and exercise and repeat lipids in 48month. If TG still above goal, can start vascepa.

## 2022-05-12 ENCOUNTER — Encounter: Payer: Self-pay | Admitting: Gastroenterology

## 2022-05-17 ENCOUNTER — Ambulatory Visit (INDEPENDENT_AMBULATORY_CARE_PROVIDER_SITE_OTHER): Payer: 59

## 2022-05-17 ENCOUNTER — Ambulatory Visit (INDEPENDENT_AMBULATORY_CARE_PROVIDER_SITE_OTHER): Payer: 59 | Admitting: Sports Medicine

## 2022-05-17 ENCOUNTER — Encounter: Payer: Self-pay | Admitting: Sports Medicine

## 2022-05-17 VITALS — BP 121/74 | HR 76 | Wt 207.0 lb

## 2022-05-17 DIAGNOSIS — L989 Disorder of the skin and subcutaneous tissue, unspecified: Secondary | ICD-10-CM | POA: Diagnosis not present

## 2022-05-17 DIAGNOSIS — M7062 Trochanteric bursitis, left hip: Secondary | ICD-10-CM

## 2022-05-17 DIAGNOSIS — E119 Type 2 diabetes mellitus without complications: Secondary | ICD-10-CM

## 2022-05-17 LAB — POCT GLYCOSYLATED HEMOGLOBIN (HGB A1C): Hemoglobin A1C: 6 % — AB (ref 4.0–5.6)

## 2022-05-17 MED ORDER — SEMAGLUTIDE 14 MG PO TABS: 1.0000 | ORAL_TABLET | Freq: Every day | ORAL | 11 refills | Status: DC

## 2022-05-17 MED ORDER — TRIAMCINOLONE ACETONIDE 40 MG/ML IJ SUSP
40.0000 mg | Freq: Once | INTRAMUSCULAR | Status: AC
  Administered 2022-05-17: 40 mg via INTRAMUSCULAR

## 2022-05-17 NOTE — Assessment & Plan Note (Signed)
Worsening hip pain, last injected February 2023, she has been doing some home conditioning without significant improvement, pain is referred laterally but reproduced with internal rotation of the hip suggestive of a hip joint pain generator. We injected her hip joint today, return to see me in 3 months.

## 2022-05-17 NOTE — Assessment & Plan Note (Signed)
Diabetes mellitus type 2 last A1c was 6.8%, we increased Rybelsus to 7 mg, she lost 10 pounds, A1c down to 6%. Up-to-date on screenings with the exception of microalbumin creatinine ratio. Increasing Rybelsus to 14 mg, will probably get a microalbumin creatinine in the follow-up visit.

## 2022-05-17 NOTE — Progress Notes (Signed)
    Procedures performed today:    Procedure: Real-time Ultrasound Guided injection of the left hip joint Device: Samsung [id number]  Verbal informed consent obtained.  Time-out conducted.  Noted no overlying erythema, induration, or other signs of local infection.  Skin prepped in a sterile fashion.  Local anesthesia: Topical Ethyl chloride.  With sterile technique and under real time ultrasound guidance: Minimally arthritic changes, 1 cc Kenalog 40, 2 cc lidocaine, 2 cc bupivacaine injected easily Completed without difficulty  Advised to call if fevers/chills, erythema, induration, drainage, or persistent bleeding.  Images permanently stored and available for review in PACS.  Impression: Technically successful ultrasound guided injection.  Independent interpretation of notes and tests performed by another provider:   None.  Brief History, Exam, Impression, and Recommendations:    Controlled type 2 diabetes mellitus without complication, without long-term current use of insulin (HCC) Diabetes mellitus type 2 last A1c was 6.8%, we increased Rybelsus to 7 mg, she lost 10 pounds, A1c down to 6%. Up-to-date on screenings with the exception of microalbumin creatinine ratio. Increasing Rybelsus to 14 mg, will probably get a microalbumin creatinine in the follow-up visit.  Trochanteric bursitis and hip joint arthritis, left Worsening hip pain, last injected February 2023, she has been doing some home conditioning without significant improvement, pain is referred laterally but reproduced with internal rotation of the hip suggestive of a hip joint pain generator. We injected her hip joint today, return to see me in 3 months.  Skin lesion left lower lip Question squamous cell carcinoma left lower lip, referral to dermatology.    ____________________________________________ [name], M.D., ABFM., CAQSM., AME. Primary Care and Sports Medicine [facility]  [facility]  Adjunct Professor of [employer]  Risk manager

## 2022-05-17 NOTE — Assessment & Plan Note (Signed)
Question squamous cell carcinoma left lower lip, referral to dermatology.

## 2022-05-20 ENCOUNTER — Encounter: Payer: 59 | Admitting: Gastroenterology

## 2022-05-20 ENCOUNTER — Telehealth: Payer: Self-pay | Admitting: *Deleted

## 2022-05-20 DIAGNOSIS — R110 Nausea: Secondary | ICD-10-CM

## 2022-05-20 DIAGNOSIS — Z1211 Encounter for screening for malignant neoplasm of colon: Secondary | ICD-10-CM

## 2022-05-20 NOTE — Telephone Encounter (Signed)
Patient care partner sick so [facility] cancelled  today and rescheduled for 06/27/22 with  Dr Carlean Purl- Pt needed an AM appt and you didn't have available AM appts until May.

## 2022-05-20 NOTE — Telephone Encounter (Signed)
Got it. Sorry to hear this. OK for CG to perform procedures as outlined. GM

## 2022-05-27 ENCOUNTER — Encounter: Payer: 59 | Admitting: Gastroenterology

## 2022-05-30 ENCOUNTER — Encounter: Payer: Self-pay | Admitting: Internal Medicine

## 2022-06-27 ENCOUNTER — Encounter: Payer: Self-pay | Admitting: Internal Medicine

## 2022-06-27 ENCOUNTER — Ambulatory Visit (AMBULATORY_SURGERY_CENTER): Payer: 59 | Admitting: Internal Medicine

## 2022-06-27 VITALS — BP 107/58 | HR 58 | Temp 98.0°F | Resp 13 | Ht 66.0 in | Wt 211.0 lb

## 2022-06-27 DIAGNOSIS — Z1211 Encounter for screening for malignant neoplasm of colon: Secondary | ICD-10-CM

## 2022-06-27 DIAGNOSIS — R1012 Left upper quadrant pain: Secondary | ICD-10-CM

## 2022-06-27 DIAGNOSIS — D128 Benign neoplasm of rectum: Secondary | ICD-10-CM | POA: Diagnosis not present

## 2022-06-27 MED ORDER — SODIUM CHLORIDE 0.9 % IV SOLN: 500.0000 mL | Freq: Once | INTRAVENOUS | Status: DC

## 2022-06-27 NOTE — Progress Notes (Signed)
Report to PACU, RN, vss, BBS= Clear.  

## 2022-06-27 NOTE — Progress Notes (Signed)
Vitals-CW  Pt's states no medical or surgical changes since previsit or office visit. 

## 2022-06-27 NOTE — Op Note (Signed)
[facility] Patient Name: Sarah Nicholson Procedure Date: 06/27/2022 8:28 AM MRN: [id number] Endoscopist: Iva Boop , MD, [id number] Age: 52 Referring MD:  Date of Birth: 1970-10-16 Gender: Female Account #: [id number] Procedure:                Upper GI endoscopy Indications:              Abdominal pain in the left upper quadrant - chronic                            and recurrent - angiogram for ? celiac a. stenosis                            negative and no abnormality on cross sectional                            imaging Medicines:                Monitored Anesthesia Care Procedure:                Pre-Anesthesia Assessment:                           - Prior to the procedure, a History and Physical                            was performed, and patient medications and                            allergies were reviewed. The patient's tolerance of                            previous anesthesia was also reviewed. The risks                            and benefits of the procedure and the sedation                            options and risks were discussed with the patient.                            All questions were answered, and informed consent                            was obtained. Prior Anticoagulants: The patient has                            taken no anticoagulant or antiplatelet agents. ASA                            Grade Assessment: II - A patient with mild systemic                            disease. After reviewing the risks and benefits,  the patient was deemed in satisfactory condition to                            undergo the procedure.                           After obtaining informed consent, the endoscope was                            passed under direct vision. Throughout the                            procedure, the patient's blood pressure, pulse, and                            oxygen saturations were monitored  continuously. The                            Olympus scope [id number] was introduced through the                            mouth, and advanced to the second part of duodenum.                            The upper GI endoscopy was accomplished without                            difficulty. The patient tolerated the procedure                            well. Scope In: Scope Out: Findings:                 A small amount of food (residue) was found in the                            entire examined stomach.                           The exam was otherwise without abnormality.                           The cardia and gastric fundus were normal on                            retroflexion. Complications:            No immediate complications. Estimated Blood Loss:     Estimated blood loss: none. Impression:               - A small amount of food (residue) in the stomach.                            Cleared adequately with lavage and suctioning. I  attribute this to chronic oral semaglutide.                           - The examination was otherwise normal.                           - No specimens collected. No cause of chronic                            episodic LUQ pain seen. Recommendation:           - Patient has a contact number available for                            emergencies. The signs and symptoms of potential                            delayed complications were discussed with the                            patient. Return to normal activities tomorrow.                            Written discharge instructions were provided to the                            patient.                           - See the other procedure note for documentation of                            additional recommendations but am suggesting she                            take 2 0.125 mg hyoscayamine prn as 1 tablet has                            helped some Iva Boop, MD 06/27/2022  9:12:58 AM This report has been signed electronically.

## 2022-06-27 NOTE — Patient Instructions (Addendum)
The esophagus, stomach and upper intestine looked normal.  Colonoscopy revealed a tiny rectal polyp that I removed. I do not think it is related to symptoms and the colonoscopy was otherwise normal.  I think you are having some sort of spasms in the stomach or intestines. 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  I recommend you try taking two of the hyoscyamine when you get an attack.  I will let you know pathology results and when to have another routine colonoscopy by mail and/or My Chart.  I appreciate the opportunity to care for you. Iva Boop, MD, FACG   YOU HAD AN ENDOSCOPIC PROCEDURE TODAY AT THE [facility]:   Refer to the procedure report that was given to you for any specific questions about what was found during the examination.  If the procedure report does not answer your questions, please call your gastroenterologist to clarify.  If you requested that your care partner not be given the details of your procedure findings, then the procedure report has been included in a sealed envelope for you to review at your convenience later.  YOU SHOULD EXPECT: Some feelings of bloating in the abdomen. Passage of more gas than usual.  Walking can help get rid of the air that was put into your GI tract during the procedure and reduce the bloating. If you had a lower endoscopy (such as a colonoscopy or flexible sigmoidoscopy) you may notice spotting of blood in your stool or on the toilet paper. If you underwent a bowel prep for your procedure, you may not have a normal bowel movement for a few days.  Please Note:  You might notice some irritation and congestion in your nose or some drainage.  This is from the oxygen used during your procedure.  There is no need for concern and it should clear up in a day or so.  SYMPTOMS TO REPORT IMMEDIATELY:  Following lower endoscopy (colonoscopy or flexible sigmoidoscopy):  Excessive amounts of blood in the  stool  Significant tenderness or worsening of abdominal pains  Swelling of the abdomen that is new, acute  Fever of 100F or higher  Following upper endoscopy (EGD)  Vomiting of blood or coffee ground material  New chest pain or pain under the shoulder blades  Painful or persistently difficult swallowing  New shortness of breath  Fever of 100F or higher  Black, tarry-looking stools  For urgent or emergent issues, a gastroenterologist can be reached at any hour by calling [phone]. Do not use MyChart messaging for urgent concerns.    DIET:  We do recommend a small meal at first, but then you may proceed to your regular diet.  Drink plenty of fluids but you should avoid alcoholic beverages for 24 hours.  ACTIVITY:  You should plan to take it easy for the rest of today and you should NOT DRIVE or use heavy machinery until tomorrow (because of the sedation medicines used during the test).    FOLLOW UP: Our staff will call the number listed on your records the next business day following your procedure.  We will call around 7:15- 8:00 am to check on you and address any questions or concerns that you may have regarding the information given to you following your procedure. If we do not reach you, we will leave a message.     If any biopsies were taken you will be contacted by phone or by letter within the next 1-3 weeks.  Please call us at [phone] if you have not heard about the biopsies in 3 weeks.    SIGNATURES/CONFIDENTIALITY: You and/or your care partner have signed paperwork which will be entered into your electronic medical record.  These signatures attest to the fact that that the information above on your After Visit Summary has been reviewed and is understood.  Full responsibility of the confidentiality of this discharge information lies with you and/or your care-partner.

## 2022-06-27 NOTE — Op Note (Signed)
[facility] Patient Name: Sarah Nicholson Procedure Date: 06/27/2022 8:27 AM MRN: [id number] Endoscopist: Iva Boop , MD, [id number] Age: 52 Referring MD:  Date of Birth: Dec 02, 1970 Gender: Female Account #: [id number] Procedure:                Colonoscopy Indications:              Screening for colorectal malignant neoplasm Medicines:                Monitored Anesthesia Care Procedure:                Pre-Anesthesia Assessment:                           - Prior to the procedure, a History and Physical                            was performed, and patient medications and                            allergies were reviewed. The patient's tolerance of                            previous anesthesia was also reviewed. The risks                            and benefits of the procedure and the sedation                            options and risks were discussed with the patient.                            All questions were answered, and informed consent                            was obtained. Prior Anticoagulants: The patient has                            taken no anticoagulant or antiplatelet agents. ASA                            Grade Assessment: II - A patient with mild systemic                            disease. After reviewing the risks and benefits,                            the patient was deemed in satisfactory condition to                            undergo the procedure.                           After obtaining informed consent, the colonoscope  was passed under direct vision. Throughout the                            procedure, the patient's blood pressure, pulse, and                            oxygen saturations were monitored continuously. The                            CF [id number] #[id number] was introduced through the anus                            and advanced to the the terminal ileum, with                             identification of the appendiceal orifice and IC                            valve. The colonoscopy was performed without                            difficulty. The patient tolerated the procedure                            well. The quality of the bowel preparation was                            good. The ileocecal valve, appendiceal orifice, and                            rectum were photographed. The bowel preparation                            used was Miralax via split dose instruction. Scope In: 8:48:38 AM Scope Out: 9:01:59 AM Scope Withdrawal Time: 0 hours 9 minutes 31 seconds  Total Procedure Duration: 0 hours 13 minutes 21 seconds  Findings:                 The perianal and digital rectal examinations were                            normal.                           The terminal ileum appeared normal.                           A diminutive polyp was found in the rectum. The                            polyp was sessile. The polyp was removed with a                            cold snare. Resection and retrieval were complete.  Verification of patient identification for the                            specimen was done. Estimated blood loss was minimal.                           The exam was otherwise without abnormality on                            direct and retroflexion views. Complications:            No immediate complications. Estimated Blood Loss:     Estimated blood loss was minimal. Impression:               - The examined portion of the ileum was normal.                           - One diminutive polyp in the rectum, removed with                            a cold snare. Resected and retrieved.                           - The examination was otherwise normal on direct                            and retroflexion views. Recommendation:           - Patient has a contact number available for                            emergencies. The signs and  symptoms of potential                            delayed complications were discussed with the                            patient. Return to normal activities tomorrow.                            Written discharge instructions were provided to the                            patient.                           - Resume previous diet.                           - Continue present medications.                           - See the other procedure note for documentation of                            additional recommendations. Iva Boop, MD 06/27/2022 9:15:01  AM This report has been signed electronically.

## 2022-06-27 NOTE — Progress Notes (Signed)
Gastroenterology History and Physical   Primary Care Physician:  Monica Becton, MD   Reason for Procedure:   LUQ pain, nausea + CRCA screening  Plan:    EGD, colonoscopy     HPI: Sarah Nicholson is a 52 y.o. female w/ recurrent episodic LUQ pain and nausea. W/U for celiac stenosis negative (angiogram) see also 1   Past Medical History:  Diagnosis Date   ADD (attention deficit disorder) 06/14/2012   Anxiety    Bronchitis    Complication of anesthesia 1987   states woke up during appedentomy   Depression 06/14/2012   Hepatic steatosis 11/13/2018   Hepatic steatosis with hepatomegaly seen on MRI abd on 11/2018   History of leukocytosis 06/14/2012   Reports "WBC 32" decades ago with negative hematology specialist workup.    Hyperlipidemia 06/14/2012   Hypertension    Seasonal allergies 06/14/2012    Past Surgical History:  Procedure Laterality Date   APPENDECTOMY     BICEPT TENODESIS Right 10/10/2019   Procedure: BICEPS TENODESIS;  Surgeon: Bjorn Pippin, MD;  Location: [facility];  Service: Orthopedics;  Laterality: Right;   bladder sling/mesh     INGUINAL HERNIA REPAIR     intrauterine ablation     LEFT HEART CATH AND CORONARY ANGIOGRAPHY N/A 12/09/2021   Procedure: LEFT HEART CATH AND CORONARY ANGIOGRAPHY;  Surgeon: Lyn Records, MD;  Location: [facility];  Service: Cardiovascular;  Laterality: N/A;   NASAL SINUS SURGERY     TUBAL LIGATION     VISCERAL ANGIOGRAPHY N/A 02/04/2022   Procedure: VISCERAL ANGIOGRAPHY/Mesentric;  Surgeon: Cephus Shelling, MD;  Location: [facility];  Service: Cardiovascular;  Laterality: N/A;    Prior to Admission medications   Medication Sig Start Date End Date Taking? Authorizing Provider  amLODipine (NORVASC) 5 MG tablet Take 1 tablet (5 mg total) by mouth at bedtime. 02/25/22  Yes Gaston Islam., NP  aspirin EC 81 MG tablet Take 1 tablet (81 mg total) by mouth daily. 12/01/21   Yes Monica Becton, MD  cetirizine (ZYRTEC) 10 MG tablet Take 20 mg by mouth daily as needed for allergies ([place]).   Yes [provider]  enalapril (VASOTEC) 10 MG tablet Take 1 tablet (10 mg total) by mouth 2 (two) times daily. 11/12/21  Yes Monica Becton, MD  escitalopram (LEXAPRO) 20 MG tablet Take 2 tablets (40 mg total) by mouth daily. 07/14/21  Yes Monica Becton, MD  ezetimibe (ZETIA) 10 MG tablet Take 1 tablet (10 mg total) by mouth daily. 02/02/22  Yes Meriam Sprague, MD  gabapentin (NEURONTIN) 300 MG capsule Take 1 capsule (300 mg total) by mouth 3 (three) times daily. 08/20/21  Yes Monica Becton, MD  isosorbide mononitrate (IMDUR) 30 MG 24 hr tablet Take 0.5 tablets (15 mg total) by mouth daily. CAN TAKE AN ADDITIONAL HALF TABLET AS NEEDED FOR CHEST PAIN Patient taking differently: Take 30 mg by mouth daily. CAN TAKE AN ADDITIONAL HALF TABLET AS NEEDED FOR CHEST PAIN 02/25/22  Yes Gaston Islam., NP  metFORMIN (GLUCOPHAGE-XR) 750 MG 24 hr tablet Take 1 tablet (750 mg total) by mouth daily with breakfast. 02/15/22  Yes Monica Becton, MD  rosuvastatin (CRESTOR) 20 MG tablet Take 1 tablet (20 mg total) by mouth daily. 11/05/21  Yes Monica Becton, MD  Semaglutide 14 MG TABS Take 1 tablet (14 mg total) by mouth daily. 05/17/22  Yes  Monica Becton, MD  clonazePAM (KLONOPIN) 0.5 MG tablet TAKE 1 TABLET(0.5 MG) BY MOUTH THREE TIMES DAILY AS NEEDED FOR ANXIETY 03/29/21   Monica Becton, MD  Hyoscyamine Sulfate SL (LEVSIN/SL) 0.125 MG SUBL Dissolve one tablet every 4-6 hours as needed for abdominal pain/spasms 03/18/22   Esterwood, Amy S, PA-C  nicotine (NICODERM CQ - DOSED IN MG/24 HOURS) 21 mg/24hr patch Place 1 patch (21 mg total) onto the skin daily. 01/31/22   Meriam Sprague, MD  nitroGLYCERIN (NITROSTAT) 0.4 MG SL tablet Place 1 tablet (0.4 mg total) under the tongue every 5 (five) minutes as needed for chest  pain. 12/30/21   Gaston Islam., NP    Current Outpatient Medications  Medication Sig Dispense Refill   amLODipine (NORVASC) 5 MG tablet Take 1 tablet (5 mg total) by mouth at bedtime. 90 tablet 3   aspirin EC 81 MG tablet Take 1 tablet (81 mg total) by mouth daily. 90 tablet 3   cetirizine (ZYRTEC) 10 MG tablet Take 20 mg by mouth daily as needed for allergies ([place]).     enalapril (VASOTEC) 10 MG tablet Take 1 tablet (10 mg total) by mouth 2 (two) times daily. 60 tablet 1   escitalopram (LEXAPRO) 20 MG tablet Take 2 tablets (40 mg total) by mouth daily. 180 tablet 3   ezetimibe (ZETIA) 10 MG tablet Take 1 tablet (10 mg total) by mouth daily. 90 tablet 3   gabapentin (NEURONTIN) 300 MG capsule Take 1 capsule (300 mg total) by mouth 3 (three) times daily. 270 capsule 3   isosorbide mononitrate (IMDUR) 30 MG 24 hr tablet Take 0.5 tablets (15 mg total) by mouth daily. CAN TAKE AN ADDITIONAL HALF TABLET AS NEEDED FOR CHEST PAIN (Patient taking differently: Take 30 mg by mouth daily. CAN TAKE AN ADDITIONAL HALF TABLET AS NEEDED FOR CHEST PAIN) 90 tablet 2   metFORMIN (GLUCOPHAGE-XR) 750 MG 24 hr tablet Take 1 tablet (750 mg total) by mouth daily with breakfast. 30 tablet 11   rosuvastatin (CRESTOR) 20 MG tablet Take 1 tablet (20 mg total) by mouth daily. 90 tablet 3   Semaglutide 14 MG TABS Take 1 tablet (14 mg total) by mouth daily. 30 tablet 11   clonazePAM (KLONOPIN) 0.5 MG tablet TAKE 1 TABLET(0.5 MG) BY MOUTH THREE TIMES DAILY AS NEEDED FOR ANXIETY 30 tablet 3   Hyoscyamine Sulfate SL (LEVSIN/SL) 0.125 MG SUBL Dissolve one tablet every 4-6 hours as needed for abdominal pain/spasms 30 tablet 4   nicotine (NICODERM CQ - DOSED IN MG/24 HOURS) 21 mg/24hr patch Place 1 patch (21 mg total) onto the skin daily. 28 patch 3   nitroGLYCERIN (NITROSTAT) 0.4 MG SL tablet Place 1 tablet (0.4 mg total) under the tongue every 5 (five) minutes as needed for chest pain. 25 tablet 3   Current  Facility-Administered Medications  Medication Dose Route Frequency Provider Last Rate Last Admin   0.9 %  sodium chloride infusion  500 mL Intravenous Once Iva Boop, MD        Allergies as of 06/27/2022 - Review Complete 06/27/2022  Allergen Reaction Noted   Macrobid [nitrofurantoin] Hives and Rash 03/11/2013   Other Hives 03/11/2013   Tetanus toxoids Swelling 06/14/2012   Thimerosal (thiomersal) Rash 08/03/2018    Family History  Problem Relation Age of Onset   Depression Mother    Diabetes Father    Hyperlipidemia Father    Hypertension Father    Depression Sister  Breast cancer Maternal Grandmother    Crohn's disease Maternal Grandmother    High blood pressure Paternal Grandfather    Diabetes Paternal Grandfather    Depression Son    Sleep apnea Neg Hx    Liver disease Neg Hx    Esophageal cancer Neg Hx    Stomach cancer Neg Hx     Social History   Socioeconomic History   Marital status: Married    Spouse name: Not on file   Number of children: 3   Years of education: Not on file   Highest education level: Not on file  Occupational History   Occupation: [employer]  Tobacco Use   Smoking status: Every Day    Packs/day: .25    Types: Cigarettes   Smokeless tobacco: Never   Tobacco comments:    30 years , currently on Chantix  Vaping Use   Vaping Use: Never used  Substance and Sexual Activity   Alcohol use: Not Currently   Drug use: No   Sexual activity: Not on file  Other Topics Concern   Not on file  Social History Narrative   Not on file   Social Determinants of Health   Financial Resource Strain: Not on file  Food Insecurity: Not on file  Transportation Needs: Not on file  Physical Activity: Not on file  Stress: Not on file  Social Connections: Not on file  Intimate Partner Violence: Not on file    Review of Systems:  All other review of systems negative except as mentioned in the HPI.  Physical Exam: Vital signs BP 111/66    Pulse 60   Temp 98 F (36.7 C)   Resp 17   Ht  (1.676 m)   Wt 211 lb (95.7 kg)   LMP  (LMP Unknown)   SpO2 95%   BMI 34.06 kg/m   General:   Alert,  Well-developed, well-nourished, pleasant and cooperative in NAD Lungs:  Clear throughout to auscultation.   Heart:  Regular rate and rhythm; no murmurs, clicks, rubs,  or gallops. Abdomen:  Soft, nontender and nondistended. Normal bowel sounds.   Neuro/Psych:  Alert and cooperative. Normal mood and affect. A and O x 3    Sena Slate, MD, [facility] Gastroenterology [phone] (pager) 06/27/2022 8:32 AM@

## 2022-06-28 ENCOUNTER — Telehealth: Payer: Self-pay

## 2022-06-28 NOTE — Telephone Encounter (Signed)
  Follow up Call-     06/27/2022    7:54 AM 06/27/2022    7:45 AM  Call back number  Post procedure Call Back phone  # [phone]   Permission to leave phone message  Yes     Patient questions:  Do you have a fever, pain , or abdominal swelling? No. Pain Score  0 *  Have you tolerated food without any problems? Yes.    Have you been able to return to your normal activities? Yes.    Do you have any questions about your discharge instructions: Diet   No. Medications  No. Follow up visit  No.  Do you have questions or concerns about your Care? No.  Actions: * If pain score is 4 or above: No action needed, pain <4.

## 2022-06-30 ENCOUNTER — Ambulatory Visit: Payer: 59 | Admitting: Neurology

## 2022-07-01 NOTE — Telephone Encounter (Signed)
Pt had Colon on 4/15 she developed cramps/ache in the upper abd that radiated to the lower left side that come and goes and can double her over since yesterday.  She had a BM yesterday and this morning that was normal for her. She is not passing gas however and feels bloated. She has no nausea or vomiting.  She has not tried anything for it.  She has been advised to walk, try hot tea, and heating pad to the abd.  She will also try gas x.  Walking has made it feel better and seemed to help the discomfort move to the lower abd from the upper abd.  She was told to call if these things do not help or if they worsen. She will go to the ED if the pain is severe or she develops further symptoms.  Dr Lucretia Kern   Dr Leonides Schanz Dr Leone Payor is off and you are DOD can you review for any further recommendations?  Thank you

## 2022-07-01 NOTE — Telephone Encounter (Signed)
Inbound call from patient, states she is having severe stomach cramps since after her procedure. She would like to speak to a nurse to further advise.

## 2022-07-09 ENCOUNTER — Encounter: Payer: Self-pay | Admitting: Internal Medicine

## 2022-07-09 DIAGNOSIS — Z86010 Personal history of colonic polyps: Secondary | ICD-10-CM | POA: Insufficient documentation

## 2022-07-19 ENCOUNTER — Ambulatory Visit (INDEPENDENT_AMBULATORY_CARE_PROVIDER_SITE_OTHER): Payer: 59 | Admitting: Neurology

## 2022-07-19 ENCOUNTER — Encounter: Payer: Self-pay | Admitting: Neurology

## 2022-07-19 VITALS — BP 124/78 | HR 63 | Ht 66.0 in | Wt 201.0 lb

## 2022-07-19 DIAGNOSIS — G4733 Obstructive sleep apnea (adult) (pediatric): Secondary | ICD-10-CM

## 2022-07-19 NOTE — Patient Instructions (Signed)
It was nice to see you again today. I am glad to hear, things are going well with your autoPAP therapy. You have adjusted well to treatment with your new machine, and you are compliant with it. You have also fulfilled the insurance-mandated compliance percentage, which is reassuring, so you can get ongoing supplies through your insurance. Please talk to your DME provider about getting replacement supplies on a regular basis. Please be sure to change your filter every month, your mask about every 3 months, hose about every 6 months, humidifier chamber about yearly. Some restrictions are imposed by your insurance carrier with regard to how frequently you can get certain supplies.  Your DME company can provide further details if necessary.   Please continue using your autoPAP regularly. While your insurance requires that you use PAP at least 4 hours each night on 70% of the nights, I recommend, that you not skip any nights and use it throughout the night if you can. Getting used to PAP and staying with the treatment long term does take time and patience and discipline. Untreated obstructive sleep apnea when it is moderate to severe can have an adverse impact on cardiovascular health and raise her risk for heart disease, arrhythmias, hypertension, congestive heart failure, stroke and diabetes. Untreated obstructive sleep apnea causes sleep disruption, nonrestorative sleep, and sleep deprivation. This can have an impact on your day to day functioning and cause daytime sleepiness and impairment of cognitive function, memory loss, mood disturbance, and problems focussing. Using PAP regularly can improve these symptoms.  We can see you in 1 year, you can see one of our nurse practitioners as you are stable.   

## 2022-07-19 NOTE — Progress Notes (Signed)
Subjective:    Patient ID: Sarah Nicholson is a 52 y.o. female.  HPI    Interim history:    [name] is a 52 year old female with an underlying medical history of ADD, migraine headaches, hypertension, hyperlipidemia, allergies, depression, anxiety, hepatic steatosis, dizziness, and obesity, who presents for follow-up consultation of her sleep apnea, after interim testing and starting home autoPAP therapy. The patient is unaccompanied today. I first met her at the request of Dr. Marjory Lies on 12/29/2021, at which time she reported snoring and excessive daytime somnolence.  She was advised to proceed with a sleep study.  She had a home sleep test through our office on 02/22/2022 which indicated moderate obstructive sleep apnea with a total AHI of 20.7/hour and O2 nadir of 85%.  Mild to moderate snoring was detected.  She was advised to proceed with home AutoPap therapy.  Her set up date was 03/22/2022.  She has a ResMed air sense 10 AutoSet machine.  Her DME company is [employer].  Today, 07/19/2022: I reviewed her AutoPap compliance data from 06/18/2022 through 07/17/2022, which is a total of 30 days, during which time she used her machine every night with percent use days greater than 4 hours at 100%, indicating superb compliance with an average usage of 8 hours and 5 minutes, residual AHI at goal at 3.7/h, 95th percentile of pressure at 10.1 cm with a range of 5 to 12 cm with EPR of 3.  Leak acceptable with the 95th percentile at 8.8 L/min.  She reports doing well, he has adjusted well to her AutoPap, she reports improvement in her sleep consolidation and sleep quality, wakes up better rested.  Epworth sleepiness score is 5 out of 24, previously was 11.  She is using a nasal mask with good success, she changed from a small to a small wide for better tolerance.  She is working on weight loss and has lost over 20 pounds since we first met.  She is very pleased with her outcome thus far and very  motivated to continue with treatment.  The patient's allergies, current medications, family history, past medical history, past social history, past surgical history and problem list were reviewed and updated as appropriate.   Previously:   12/29/21: (She) reports snoring and excessive daytime somnolence.  I reviewed your office note from 12/21/2021.  Her Epworth sleepiness score is 11 out of 24, fatigue severity score is 44 out of 63.  She has had fluctuating blood pressure values, takes clonidine as needed.  She works as a Systems developer.  Bedtime is generally between 9 and 10 PM and rise time between 5 and 7 AM.  She does not have a TV in her bedroom, she lives with her husband, she has 3 grown children, she has 3 dogs, 2 cats and 2 fish (in a pond outside) as pets, one of the dogs sleeps on her bed usually.  She has had ocular migraines.  She has a nephew with sleep apnea.  Weight has gradually increased over the past several years.  She does drink quite a bit of caffeine in the form of diet soda, close to 4 L/day.  She is working on smoking cessation, is down to 2 cigarettes/day.  She drinks alcohol very rarely.  She has nocturia about once per average night, has had rare morning headaches.  She has seen cardiology for chest pain.  She has a follow-up appointment tomorrow.    Her Past Medical History Is Significant  For: Past Medical History:  Diagnosis Date   ADD (attention deficit disorder) 06/14/2012   Anxiety    Bronchitis    Complication of anesthesia 1987   states woke up during appedentomy   Depression 06/14/2012   Hepatic steatosis 11/13/2018   Hepatic steatosis with hepatomegaly seen on MRI abd on 11/2018   History of leukocytosis 06/14/2012   Reports "WBC 32" decades ago with negative hematology specialist workup.    Hyperlipidemia 06/14/2012   Hypertension    Seasonal allergies 06/14/2012    Her Past Surgical History Is Significant For: Past Surgical History:  Procedure  Laterality Date   APPENDECTOMY     BICEPT TENODESIS Right 10/10/2019   Procedure: BICEPS TENODESIS;  Surgeon: Bjorn Pippin, MD;  Location: [facility];  Service: Orthopedics;  Laterality: Right;   bladder sling/mesh     INGUINAL HERNIA REPAIR     intrauterine ablation     LEFT HEART CATH AND CORONARY ANGIOGRAPHY N/A 12/09/2021   Procedure: LEFT HEART CATH AND CORONARY ANGIOGRAPHY;  Surgeon: Lyn Records, MD;  Location: [facility];  Service: Cardiovascular;  Laterality: N/A;   NASAL SINUS SURGERY     TUBAL LIGATION     VISCERAL ANGIOGRAPHY N/A 02/04/2022   Procedure: VISCERAL ANGIOGRAPHY/Mesentric;  Surgeon: Cephus Shelling, MD;  Location: [facility];  Service: Cardiovascular;  Laterality: N/A;    Her Family History Is Significant For: Family History  Problem Relation Age of Onset   Depression Mother    Diabetes Father    Hyperlipidemia Father    Hypertension Father    Depression Sister    Breast cancer Maternal Grandmother    Crohn's disease Maternal Grandmother    High blood pressure Paternal Grandfather    Diabetes Paternal Grandfather    Depression Son    Sleep apnea Neg Hx    Liver disease Neg Hx    Esophageal cancer Neg Hx    Stomach cancer Neg Hx     Her Social History Is Significant For: Social History   Socioeconomic History   Marital status: Married    Spouse name: Not on file   Number of children: 3   Years of education: Not on file   Highest education level: Not on file  Occupational History   Occupation: [employer]  Tobacco Use   Smoking status: Every Day    Packs/day: 1    Types: Cigarettes   Smokeless tobacco: Never   Tobacco comments:    30 years , currently on Chantix  Vaping Use   Vaping Use: Never used  Substance and Sexual Activity   Alcohol use: Not Currently   Drug use: No   Sexual activity: Not on file  Other Topics Concern   Not on file  Social History Narrative   Not on file   Social Determinants  of Health   Financial Resource Strain: Not on file  Food Insecurity: Not on file  Transportation Needs: Not on file  Physical Activity: Not on file  Stress: Not on file  Social Connections: Not on file    Her Allergies Are:  Allergies  Allergen Reactions   Macrobid [Nitrofurantoin] Hives and Rash   Other Hives    Preservative used in vaccinations   Tetanus Toxoids Swelling    Pt states she was told the Tdap injection would have to be administered in the hospital  After discussion on 11/10 pt remembers being told it was thimersol in the tetanus 30+years ago that  caused the swelling.     Thimerosal (Thiomersal) Rash  :   Her Current Medications Are:  Outpatient Encounter Medications as of 07/19/2022  Medication Sig   amLODipine (NORVASC) 5 MG tablet Take 1 tablet (5 mg total) by mouth at bedtime.   aspirin EC 81 MG tablet Take 1 tablet (81 mg total) by mouth daily.   cetirizine (ZYRTEC) 10 MG tablet Take 20 mg by mouth daily as needed for allergies (Spring).   clonazePAM (KLONOPIN) 0.5 MG tablet TAKE 1 TABLET(0.5 MG) BY MOUTH THREE TIMES DAILY AS NEEDED FOR ANXIETY   enalapril (VASOTEC) 10 MG tablet Take 1 tablet (10 mg total) by mouth 2 (two) times daily.   escitalopram (LEXAPRO) 20 MG tablet Take 2 tablets (40 mg total) by mouth daily.   ezetimibe (ZETIA) 10 MG tablet Take 1 tablet (10 mg total) by mouth daily.   gabapentin (NEURONTIN) 300 MG capsule Take 1 capsule (300 mg total) by mouth 3 (three) times daily.   Hyoscyamine Sulfate SL (LEVSIN/SL) 0.125 MG SUBL Dissolve one tablet every 4-6 hours as needed for abdominal pain/spasms   isosorbide mononitrate (IMDUR) 30 MG 24 hr tablet Take 0.5 tablets (15 mg total) by mouth daily. CAN TAKE AN ADDITIONAL HALF TABLET AS NEEDED FOR CHEST PAIN (Patient taking differently: Take 30 mg by mouth daily. CAN TAKE AN ADDITIONAL HALF TABLET AS NEEDED FOR CHEST PAIN)   metFORMIN (GLUCOPHAGE-XR) 750 MG 24 hr tablet Take 1 tablet (750 mg total) by  mouth daily with breakfast.   nicotine (NICODERM CQ - DOSED IN MG/24 HOURS) 21 mg/24hr patch Place 1 patch (21 mg total) onto the skin daily.   nitroGLYCERIN (NITROSTAT) 0.4 MG SL tablet Place 1 tablet (0.4 mg total) under the tongue every 5 (five) minutes as needed for chest pain.   rosuvastatin (CRESTOR) 20 MG tablet Take 1 tablet (20 mg total) by mouth daily.   Semaglutide 14 MG TABS Take 1 tablet (14 mg total) by mouth daily.   No facility-administered encounter medications on file as of 07/19/2022.  :  Review of Systems:  Out of a complete 14 point review of systems, all are reviewed and negative with the exception of these symptoms as listed below:  Review of Systems  Neurological:        Pt here for CPAP f/u Pt states no questions or concerns for to days visit   ESS:5     Objective:  Neurological Exam  Physical Exam Physical Examination:   Vitals:   07/19/22 1226  BP: 124/78  Pulse: 63    General Examination: The patient is a very pleasant 52 y.o. female in no acute distress. She appears well-developed and well-nourished and well groomed.   HEENT: Normocephalic, atraumatic, pupils are equal, round and reactive to light, corrective eyeglasses in place.  Extraocular tracking is well-preserved, no obvious nystagmus.  Hearing is grossly intact.  Face is symmetric with normal facial animation, speech is clear without dysarthria, hypophonia or voice tremor, no significant change in airway exam, mild mouth dryness noted.  Tongue protrudes centrally and palate elevates symmetrically.  No carotid bruits.    Chest: Clear to auscultation without wheezing, rhonchi or crackles noted.   Heart: S1+S2+0, regular and normal without murmurs, rubs or gallops noted.    Abdomen: Soft, non-tender and non-distended.   Extremities: There is no pitting edema in the distal lower extremities bilaterally.    Skin: Warm and dry without trophic changes noted.    Musculoskeletal: exam reveals no  obvious joint deformities.    Neurologically:  Mental status: The patient is awake, alert and oriented in all 4 spheres. Her immediate and remote memory, attention, language skills and fund of knowledge are appropriate. There is no evidence of aphasia, agnosia, apraxia or anomia. Speech is clear with normal prosody and enunciation. Thought process is linear. Mood is normal and affect is normal.  Cranial nerves II - XII are as described above under HEENT exam.  Motor exam: Normal bulk, moving all 4 extremities without difficulty, no obvious action or resting tremor.  Fine motor skills and coordination: grossly intact.  Cerebellar testing: No dysmetria or intention tremor. There is no truncal or gait ataxia.  Sensory exam: intact to light touch in the upper and lower extremities.  Gait, station and balance: She stands easily. No veering to one side is noted. No leaning to one side is noted. Posture is age-appropriate and stance is narrow based. Gait shows normal stride length and normal pace. No problems turning are noted.    Assessment and Plan:  In summary, Antanasia Dukes Cionna Heffner is a very pleasant 52 year old female with an underlying medical history of ADD, migraine headaches, hypertension, hyperlipidemia, allergies, depression, anxiety, hepatic steatosis, dizziness, and obesity, who presents for follow-up consultation of her sleep apnea, after interim testing and starting home autoPAP therapy. She had a home sleep test through our office on 02/22/2022 which indicated moderate obstructive sleep apnea with a total AHI of 20.7/hour and O2 nadir of 85%.  Mild to moderate snoring was detected.  She started home AutoPap therapy on 03/22/2022.  She has a ResMed air sense 10 AutoSet machine.  Her DME company is [employer].  She is fully compliant with treatment and indicates good benefit and tolerance.  Her apnea scores look good, leak is on the lower side, pressure averages around 10 cm.  She is working on weight  loss and has indeed lost weight, over 20 pounds in the last 6 months.  She is highly commended for her treatment adherence and advised to continue with her AutoPap therapy at the current settings with full compliance.  She is advised to continue to work on weight loss.  She is advised to follow-up routinely in sleep clinic see one of our nurse practitioners in 1 year, we can offer a video visit as well if she would prefer.  She is advised to follow-up with Dr. Marjory Lies as needed or as planned.  I answered all her questions today and she was in agreement. I spent 30 minutes in total face-to-face time and in reviewing records during pre-charting, more than 50% of which was spent in counseling and coordination of care, reviewing test results, reviewing medications and treatment regimen and/or in discussing or reviewing the diagnosis of OSA, the prognosis and treatment options. Pertinent laboratory and imaging test results that were available during this visit with the patient were reviewed by me and considered in my medical decision making (see chart for details).

## 2022-08-01 ENCOUNTER — Ambulatory Visit: Payer: 59 | Attending: Cardiology

## 2022-08-01 DIAGNOSIS — Z79899 Other long term (current) drug therapy: Secondary | ICD-10-CM

## 2022-08-01 DIAGNOSIS — E782 Mixed hyperlipidemia: Secondary | ICD-10-CM

## 2022-08-02 LAB — LIPID PANEL
Chol/HDL Ratio: 3 ratio (ref 0.0–4.4)
Cholesterol, Total: 121 mg/dL (ref 100–199)
HDL: 41 mg/dL (ref >39)
LDL Chol Calc (NIH): 60 mg/dL (ref 0–99)
Triglycerides: 111 mg/dL (ref 0–149)
VLDL Cholesterol Cal: 20 mg/dL (ref 5–40)

## 2022-08-15 ENCOUNTER — Telehealth: Payer: Self-pay | Admitting: Sports Medicine

## 2022-08-15 NOTE — Telephone Encounter (Signed)
"  Launa Grill sent to Monica Becton, MD Dear Dr. Benjamin Stain,  Thank you for referring Sarah Nicholson. Marye Round to Dermatology.    The reason for this communication is to share we have received your patient's referral and have closed the referral because we have attempted to contact patient in three separate occasions and have not had success. We are more than happy to reopen referral if patient calls back.   If you have questions about this referral or we can be of further assistance, please contact our office at [phone] and ask to speak with the scheduling team.   Kind regards,   [facility] - New Patient Intake Team"

## 2022-08-18 ENCOUNTER — Encounter: Payer: Self-pay | Admitting: Sports Medicine

## 2022-08-18 ENCOUNTER — Ambulatory Visit (INDEPENDENT_AMBULATORY_CARE_PROVIDER_SITE_OTHER): Payer: 59 | Admitting: Sports Medicine

## 2022-08-18 VITALS — BP 76/46 | HR 77 | Ht 66.0 in | Wt 193.0 lb

## 2022-08-18 DIAGNOSIS — M7062 Trochanteric bursitis, left hip: Secondary | ICD-10-CM

## 2022-08-18 DIAGNOSIS — I10 Essential (primary) hypertension: Secondary | ICD-10-CM

## 2022-08-18 DIAGNOSIS — L989 Disorder of the skin and subcutaneous tissue, unspecified: Secondary | ICD-10-CM

## 2022-08-18 DIAGNOSIS — E119 Type 2 diabetes mellitus without complications: Secondary | ICD-10-CM | POA: Diagnosis not present

## 2022-08-18 LAB — POCT GLYCOSYLATED HEMOGLOBIN (HGB A1C): Hemoglobin A1C: 6 % — AB (ref 4.0–5.6)

## 2022-08-18 LAB — POCT UA - MICROALBUMIN
Creatinine, POC: 50 mg/dL
Microalbumin Ur, POC: 80 mg/L

## 2022-08-18 NOTE — Assessment & Plan Note (Signed)
Resolved after injection at the last visit. 

## 2022-08-18 NOTE — Patient Instructions (Addendum)
Please contact dermatology at [phone]

## 2022-08-18 NOTE — Assessment & Plan Note (Signed)
Blood pressure is a bit low today, this is likely related to her dramatic weight loss on Rybelsus, discontinuing amlodipine for now. Continue the ACE inhibitor for renal protection.

## 2022-08-18 NOTE — Assessment & Plan Note (Signed)
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

## 2022-08-18 NOTE — Progress Notes (Signed)
    Procedures performed today:    None.  Independent interpretation of notes and tests performed by another provider:   None.  Brief History, Exam, Impression, and Recommendations:    Benign essential hypertension Blood pressure is a bit low today, this is likely related to her dramatic weight loss on Rybelsus, discontinuing amlodipine for now. Continue the ACE inhibitor for renal protection.  Trochanteric bursitis and hip joint arthritis, left Resolved after injection at the last visit.  Controlled type 2 diabetes mellitus without complication, without long-term current use of insulin (HCC) Under good control. Continue medications at current dosages. We will get a microalbumin creatinine ratio today.  Skin lesion left lower lip Question squamous cell carcinoma left lower lip, she never followed up with dermatology, I have given her the number to contact them.    ____________________________________________ Ihor Austin. Benjamin Stain, M.D., ABFM., CAQSM., AME. Primary Care and Sports Medicine  [facility]  Adjunct Professor of Family Medicine  [employer] [employer]  Restaurant manager, fast food

## 2022-08-18 NOTE — Assessment & Plan Note (Signed)
Under good control. Continue medications at current dosages. We will get a microalbumin creatinine ratio today.

## 2022-08-18 NOTE — Addendum Note (Signed)
Addended by: Carren Rang A on: 08/18/2022 08:51 AM   Modules accepted: Orders

## 2022-08-25 ENCOUNTER — Encounter: Payer: Self-pay | Admitting: Medical Oncology

## 2022-08-25 ENCOUNTER — Other Ambulatory Visit: Payer: Self-pay

## 2022-08-25 ENCOUNTER — Inpatient Hospital Stay (HOSPITAL_BASED_OUTPATIENT_CLINIC_OR_DEPARTMENT_OTHER): Payer: 59 | Admitting: Medical Oncology

## 2022-08-25 ENCOUNTER — Inpatient Hospital Stay: Payer: 59 | Attending: Hematology & Oncology

## 2022-08-25 VITALS — BP 135/58 | HR 57 | Temp 97.9°F | Resp 18 | Ht 66.0 in | Wt 193.4 lb

## 2022-08-25 DIAGNOSIS — D72829 Elevated white blood cell count, unspecified: Secondary | ICD-10-CM | POA: Diagnosis not present

## 2022-08-25 DIAGNOSIS — F17210 Nicotine dependence, cigarettes, uncomplicated: Secondary | ICD-10-CM | POA: Insufficient documentation

## 2022-08-25 DIAGNOSIS — D72823 Leukemoid reaction: Secondary | ICD-10-CM | POA: Diagnosis not present

## 2022-08-25 LAB — CMP (CANCER CENTER ONLY)
ALT: 13 U/L (ref 0–44)
AST: 13 U/L — ABNORMAL LOW (ref 15–41)
Albumin: 4.2 g/dL (ref 3.5–5.0)
Alkaline Phosphatase: 63 U/L (ref 38–126)
Anion gap: 7 (ref 5–15)
BUN: 13 mg/dL (ref 6–20)
CO2: 26 mmol/L (ref 22–32)
Calcium: 9.5 mg/dL (ref 8.9–10.3)
Chloride: 108 mmol/L (ref 98–111)
Creatinine: 1.07 mg/dL — ABNORMAL HIGH (ref 0.44–1.00)
GFR, Estimated: >60 mL/min (ref >60)
Glucose, Bld: 100 mg/dL — ABNORMAL HIGH (ref 70–99)
Potassium: 4.3 mmol/L (ref 3.5–5.1)
Sodium: 141 mmol/L (ref 135–145)
Total Bilirubin: 0.4 mg/dL (ref 0.3–1.2)
Total Protein: 6.6 g/dL (ref 6.5–8.1)

## 2022-08-25 LAB — SAVE SMEAR(SSMR), FOR PROVIDER SLIDE REVIEW

## 2022-08-25 LAB — CBC WITH DIFFERENTIAL (CANCER CENTER ONLY)
Abs Immature Granulocytes: 0.03 10*3/uL (ref 0.00–0.07)
Basophils Absolute: 0.1 10*3/uL (ref 0.0–0.1)
Basophils Relative: 1 %
Eosinophils Absolute: 0.1 10*3/uL (ref 0.0–0.5)
Eosinophils Relative: 1 %
HCT: 41.9 % (ref 36.0–46.0)
Hemoglobin: 13.8 g/dL (ref 12.0–15.0)
Immature Granulocytes: 0 %
Lymphocytes Relative: 30 %
Lymphs Abs: 3 10*3/uL (ref 0.7–4.0)
MCH: 29.7 pg (ref 26.0–34.0)
MCHC: 32.9 g/dL (ref 30.0–36.0)
MCV: 90.1 fL (ref 80.0–100.0)
Monocytes Absolute: 0.7 10*3/uL (ref 0.1–1.0)
Monocytes Relative: 7 %
Neutro Abs: 6.1 10*3/uL (ref 1.7–7.7)
Neutrophils Relative %: 61 %
Platelet Count: 267 10*3/uL (ref 150–400)
RBC: 4.65 MIL/uL (ref 3.87–5.11)
RDW: 12.6 % (ref 11.5–15.5)
WBC Count: 10 10*3/uL (ref 4.0–10.5)
nRBC: 0 % (ref 0.0–0.2)

## 2022-08-25 LAB — LACTATE DEHYDROGENASE: LDH: 139 U/L (ref 98–192)

## 2022-08-25 NOTE — Progress Notes (Signed)
Hematology and Oncology Follow Up Visit  Sarah Nicholson [id number] 06/28/1970 52 y.o. 08/25/2022   Principle Diagnosis:  Leukocytosis-leukemoid reaction  Current Therapy:   Observation     Interim History:  [name] is back for follow-up.    She reports that overall she has been doing well. At her last visit 6 months ago she had quit smoking. Today she reports that she is smoking about half a pack of cigarettes per day. She has plans to quit again very soon.   She has had no problem with infections.  She has had no rashes.  She has had no change in bowel or bladder habits.  She has had no cough or shortness of breath. No unintentional weight loss (on a weight loss plan).   She says she had a mammogram recently.  This was in late October.  Everything looked fine.  She has had no issues with bleeding.  There is no headache.  Overall, I would say that her performance status is ECOG 0.    Medications:  Current Outpatient Medications:    aspirin EC 81 MG tablet, Take 1 tablet (81 mg total) by mouth daily., Disp: 90 tablet, Rfl: 3   cetirizine (ZYRTEC) 10 MG tablet, Take 20 mg by mouth daily as needed for allergies (Spring)., Disp: , Rfl:    clonazePAM (KLONOPIN) 0.5 MG tablet, TAKE 1 TABLET(0.5 MG) BY MOUTH THREE TIMES DAILY AS NEEDED FOR ANXIETY, Disp: 30 tablet, Rfl: 3   enalapril (VASOTEC) 10 MG tablet, Take 1 tablet (10 mg total) by mouth 2 (two) times daily., Disp: 60 tablet, Rfl: 1   escitalopram (LEXAPRO) 20 MG tablet, Take 2 tablets (40 mg total) by mouth daily., Disp: 180 tablet, Rfl: 3   ezetimibe (ZETIA) 10 MG tablet, Take 1 tablet (10 mg total) by mouth daily., Disp: 90 tablet, Rfl: 3   gabapentin (NEURONTIN) 300 MG capsule, Take 1 capsule (300 mg total) by mouth 3 (three) times daily., Disp: 270 capsule, Rfl: 3   Hyoscyamine Sulfate SL (LEVSIN/SL) 0.125 MG SUBL, Dissolve one tablet every 4-6 hours as needed for abdominal pain/spasms, Disp: 30 tablet, Rfl:  4   isosorbide mononitrate (IMDUR) 30 MG 24 hr tablet, Take 0.5 tablets (15 mg total) by mouth daily. CAN TAKE AN ADDITIONAL HALF TABLET AS NEEDED FOR CHEST PAIN (Patient taking differently: Take 30 mg by mouth daily. CAN TAKE AN ADDITIONAL HALF TABLET AS NEEDED FOR CHEST PAIN), Disp: 90 tablet, Rfl: 2   metFORMIN (GLUCOPHAGE-XR) 750 MG 24 hr tablet, Take 1 tablet (750 mg total) by mouth daily with breakfast., Disp: 30 tablet, Rfl: 11   nicotine (NICODERM CQ - DOSED IN MG/24 HOURS) 21 mg/24hr patch, Place 1 patch (21 mg total) onto the skin daily., Disp: 28 patch, Rfl: 3   rosuvastatin (CRESTOR) 20 MG tablet, Take 1 tablet (20 mg total) by mouth daily., Disp: 90 tablet, Rfl: 3   Semaglutide 14 MG TABS, Take 1 tablet (14 mg total) by mouth daily., Disp: 30 tablet, Rfl: 11   nitroGLYCERIN (NITROSTAT) 0.4 MG SL tablet, Place 1 tablet (0.4 mg total) under the tongue every 5 (five) minutes as needed for chest pain. (Patient not taking: Reported on 08/25/2022), Disp: 25 tablet, Rfl: 3  Allergies:  Allergies  Allergen Reactions   Macrobid [Nitrofurantoin] Hives and Rash   Other Hives    Preservative used in vaccinations   Tetanus Toxoids Swelling    Pt states she was told the Tdap injection would have  to be administered in the hospital  After discussion on 11/10 pt remembers being told it was thimersol in the tetanus 30+years ago that caused the swelling.     Thimerosal (Thiomersal) Rash   Thimerosal Rash    Past Medical History, Surgical history, Social history, and Family History were reviewed and updated.  Review of Systems: Review of Systems  Constitutional: Negative.   HENT:  Negative.    Eyes: Negative.   Respiratory: Negative.    Cardiovascular: Negative.   Gastrointestinal: Negative.   Endocrine: Negative.   Genitourinary: Negative.    Musculoskeletal: Negative.   Skin: Negative.   Neurological: Negative.   Hematological: Negative.   Psychiatric/Behavioral: Negative.       Physical Exam:  height is 5\' 6"  (1.676 m) and weight is 193 lb 6.4 oz (87.7 kg). Her oral temperature is 97.9 F (36.6 C). Her blood pressure is 135/58 (abnormal) and her pulse is 57 (abnormal). Her respiration is 18 and oxygen saturation is 100%.   Wt Readings from Last 3 Encounters:  08/25/22 193 lb 6.4 oz (87.7 kg)  08/18/22 193 lb (87.5 kg)  07/19/22 201 lb (91.2 kg)    Physical Exam Vitals reviewed.  HENT:     Head: Normocephalic and atraumatic.  Eyes:     Pupils: Pupils are equal, round, and reactive to light.  Cardiovascular:     Rate and Rhythm: Normal rate and regular rhythm.     Heart sounds: Normal heart sounds.  Pulmonary:     Effort: Pulmonary effort is normal.     Breath sounds: Normal breath sounds.  Abdominal:     General: Bowel sounds are normal.     Palpations: Abdomen is soft.  Musculoskeletal:        General: No tenderness or deformity. Normal range of motion.     Cervical back: Normal range of motion.  Lymphadenopathy:     Cervical: No cervical adenopathy.  Skin:    General: Skin is warm and dry.     Findings: No erythema or rash.  Neurological:     Mental Status: She is alert and oriented to person, place, and time.  Psychiatric:        Behavior: Behavior normal.        Thought Content: Thought content normal.        Judgment: Judgment normal.      Lab Results  Component Value Date   WBC 10.0 08/25/2022   HGB 13.8 08/25/2022   HCT 41.9 08/25/2022   MCV 90.1 08/25/2022   PLT 267 08/25/2022     Chemistry      Component Value Date/Time   NA 141 08/25/2022 0830   NA 141 12/06/2021 1138   K 4.3 08/25/2022 0830   CL 108 08/25/2022 0830   CO2 26 08/25/2022 0830   BUN 13 08/25/2022 0830   BUN 13 12/06/2021 1138   CREATININE 1.07 (H) 08/25/2022 0830   CREATININE 1.09 (H) 11/03/2021 0000   GLU 78 02/29/2012 0000      Component Value Date/Time   CALCIUM 9.5 08/25/2022 0830   ALKPHOS 63 08/25/2022 0830   AST 13 (L) 08/25/2022 0830    ALT 13 08/25/2022 0830   BILITOT 0.4 08/25/2022 0830     Impression and Plan: [name] is a very charming 52 year old white female.  She has mild leukocytosis secondary to leukemoid reaction confirmed on blood smear.   Glad to hear that she is doing well. CBC looks good today which we discussed.  I have encouraged her to keep working on smoking cessation. We reviewed her CMP and she will continue to stay hydrated and have regular follow up with her PCP.   Disposition:  RTC 1 year APP, labs ( CBC w/, CMP, [facility])-[place]   Rushie Chestnut, [place] 6/13/20249:35 AM

## 2022-08-31 ENCOUNTER — Telehealth: Payer: Self-pay | Admitting: Emergency Medicine

## 2022-08-31 ENCOUNTER — Ambulatory Visit
Admission: RE | Admit: 2022-08-31 | Discharge: 2022-08-31 | Disposition: A | Discharge status: Home / Self Care | Payer: 59 | Source: Ambulatory Visit | Attending: Family Medicine | Admitting: Family Medicine

## 2022-08-31 VITALS — BP 129/84 | HR 69 | Temp 97.8°F | Resp 17

## 2022-08-31 DIAGNOSIS — T148XXA Other injury of unspecified body region, initial encounter: Secondary | ICD-10-CM | POA: Diagnosis not present

## 2022-08-31 DIAGNOSIS — M545 Low back pain, unspecified: Secondary | ICD-10-CM | POA: Diagnosis not present

## 2022-08-31 MED ORDER — KETOROLAC TROMETHAMINE 60 MG/2ML IM SOLN
60.0000 mg | Freq: Once | INTRAMUSCULAR | Status: AC
  Administered 2022-08-31: 60 mg via INTRAMUSCULAR

## 2022-08-31 MED ORDER — PREDNISONE 10 MG (21) PO TBPK: ORAL_TABLET | Freq: Every day | ORAL | 0 refills | Status: DC

## 2022-08-31 MED ORDER — DEXAMETHASONE SODIUM PHOSPHATE 10 MG/ML IJ SOLN
10.0000 mg | Freq: Once | INTRAMUSCULAR | Status: AC
  Administered 2022-08-31: 10 mg via INTRAMUSCULAR

## 2022-08-31 NOTE — Discharge Instructions (Addendum)
Advised patient to take medication as directed with food to completion.  Encouraged patient to avoid activities involving affected side of the left buttocks for the next 7 to 10 days.  Encouraged increase daily water intake to 64 ounces per day while taking this medication.  Advised if symptoms worsen and/or unresolved please follow-up with PCP or here for further evaluation.

## 2022-08-31 NOTE — Telephone Encounter (Signed)
Pt called by this RN to review reasons for the visit- pt stated she thinks it may be a muscle strain & not a fracture

## 2022-08-31 NOTE — ED Triage Notes (Signed)
Pt c/o lower back/buttock pain since yesterday. Denies injury. Says she was walking in the grocery store when she felt a sharp, shooting pain suddenly. Hurts to sit. Ice prn. Pain 7/10

## 2022-08-31 NOTE — ED Provider Notes (Signed)
[facility]    CSN: [id number] Arrival date & time: 08/31/22  1014      History   Chief Complaint Chief Complaint  Patient presents with   Back Pain    buttocks    HPI Annecia Becton is a 52 y.o. Nicholson.   HPI Sarah 52 year old Nicholson presents with lower back/buttock pain since yesterday.  Denies injury.  Says she was walking in the grocery store when she felt a sharp shooting pain suddenly.  Patient reports it is hurting her to sit.  Patient reports 7/10 of pain in this region. PMH significant for obesity, history of leukocytosis, and HTN.  Past Medical History:  Diagnosis Date   ADD (attention deficit disorder) 06/14/2012   Anxiety    Bronchitis    Complication of anesthesia 1987   states woke up during appedentomy   Depression 06/14/2012   Hepatic steatosis 11/13/2018   Hepatic steatosis with hepatomegaly seen on MRI abd on 11/2018   History of leukocytosis 06/14/2012   Reports "WBC 32" decades ago with negative hematology specialist workup.    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   Abnormal stress test 12/09/2021   Annual physical exam 11/24/2021   Hemiparesis, left (HCC) 11/03/2021   Herpes zoster 08/06/2021   Femoral hernia 03/04/2021   Leukocytosis 01/16/2021   Left lateral abdominal pain 01/15/2021   Lumbar spondylosis 01/15/2021   Trochanteric bursitis and hip joint arthritis, left 12/04/2020   Hepatic steatosis 11/13/2018   Tobacco use 07/18/2018   Irritable bowel syndrome (IBS) 05/04/2017   Hallux  valgus 12/09/2014   Angina pectoris (HCC) 07/31/2014   Other and unspecified ovarian cyst 06/25/2013   Left tennis elbow 06/25/2013   Microscopic hematuria 06/27/2012   Hyperlipidemia 06/14/2012   Seasonal allergies 06/14/2012   Depression 06/14/2012   ADD (attention deficit disorder) 06/14/2012    Past Surgical History:  Procedure Laterality Date   APPENDECTOMY     BICEPT TENODESIS Right 10/10/2019   Procedure: BICEPS TENODESIS;  Surgeon: Bjorn Pippin, MD;  Location: [facility];  Service: Orthopedics;  Laterality: Right;   bladder sling/mesh     INGUINAL HERNIA REPAIR     intrauterine ablation     LEFT HEART CATH AND CORONARY ANGIOGRAPHY N/A 12/09/2021   Procedure: LEFT HEART CATH AND CORONARY ANGIOGRAPHY;  Surgeon: Lyn Records, MD;  Location: [facility];  Service: Cardiovascular;  Laterality: N/A;   NASAL SINUS SURGERY     TUBAL LIGATION     VISCERAL ANGIOGRAPHY N/A 02/04/2022   Procedure: VISCERAL ANGIOGRAPHY/Mesentric;  Surgeon: Cephus Shelling, MD;  Location: [facility];  Service: Cardiovascular;  Laterality: N/A;    OB History     Gravida  5   Para  4   Term  2   Preterm  2   AB  1   Living  3      SAB      IAB      Ectopic      Multiple      Live Births  Home Medications    Prior to Admission medications   Medication Sig Start Date End Date Taking? Authorizing Provider  predniSONE (STERAPRED UNI-PAK 21 TAB) 10 MG (21) TBPK tablet Take by mouth daily. Take 6 tabs by mouth daily  for 2 days, then 5 tabs for 2 days, then 4 tabs for 2 days, then 3 tabs for 2 days, 2 tabs for 2 days, then 1 tab by mouth daily for 2 days 08/31/22  Yes Trevor Iha, FNP  aspirin EC 81 MG tablet Take 1 tablet (81 mg total) by mouth daily. 12/01/21   Monica Becton, MD  cetirizine (ZYRTEC) 10 MG tablet Take 20 mg by mouth daily as needed for allergies ([place]).    [provider]  clonazePAM (KLONOPIN)  0.5 MG tablet TAKE 1 TABLET(0.5 MG) BY MOUTH THREE TIMES DAILY AS NEEDED FOR ANXIETY 03/29/21   Monica Becton, MD  enalapril (VASOTEC) 10 MG tablet Take 1 tablet (10 mg total) by mouth 2 (two) times daily. 11/12/21   Monica Becton, MD  escitalopram (LEXAPRO) 20 MG tablet Take 2 tablets (40 mg total) by mouth daily. 07/14/21   Monica Becton, MD  ezetimibe (ZETIA) 10 MG tablet Take 1 tablet (10 mg total) by mouth daily. 02/02/22   Meriam Sprague, MD  gabapentin (NEURONTIN) 300 MG capsule Take 1 capsule (300 mg total) by mouth 3 (three) times daily. 08/20/21   Monica Becton, MD  Hyoscyamine Sulfate SL (LEVSIN/SL) 0.125 MG SUBL Dissolve one tablet every 4-6 hours as needed for abdominal pain/spasms 03/18/22   Esterwood, Amy S, PA-C  isosorbide mononitrate (IMDUR) 30 MG 24 hr tablet Take 0.5 tablets (15 mg total) by mouth daily. CAN TAKE AN ADDITIONAL HALF TABLET AS NEEDED FOR CHEST PAIN Patient taking differently: Take 30 mg by mouth daily. CAN TAKE AN ADDITIONAL HALF TABLET AS NEEDED FOR CHEST PAIN 02/25/22   Gaston Islam., NP  metFORMIN (GLUCOPHAGE-XR) 750 MG 24 hr tablet Take 1 tablet (750 mg total) by mouth daily with breakfast. 02/15/22   Monica Becton, MD  nicotine (NICODERM CQ - DOSED IN MG/24 HOURS) 21 mg/24hr patch Place 1 patch (21 mg total) onto the skin daily. 01/31/22   Meriam Sprague, MD  nitroGLYCERIN (NITROSTAT) 0.4 MG SL tablet Place 1 tablet (0.4 mg total) under the tongue every 5 (five) minutes as needed for chest pain. Patient not taking: Reported on 08/25/2022 12/30/21   Gaston Islam., NP  rosuvastatin (CRESTOR) 20 MG tablet Take 1 tablet (20 mg total) by mouth daily. 11/05/21   Monica Becton, MD  Semaglutide 14 MG TABS Take 1 tablet (14 mg total) by mouth daily. 05/17/22   Monica Becton, MD    Family History Family History  Problem Relation Age of Onset   Depression Mother    Diabetes Father     Hyperlipidemia Father    Hypertension Father    Depression Sister    Breast cancer Maternal Grandmother    Crohn's disease Maternal Grandmother    High blood pressure Paternal Grandfather    Diabetes Paternal Grandfather    Depression Son    Sleep apnea Neg Hx    Liver disease Neg Hx    Esophageal cancer Neg Hx    Stomach cancer Neg Hx     Social History Social History   Tobacco Use   Smoking status: Every Day    Packs/day: 1    Types: Cigarettes   Smokeless tobacco:  Never  Vaping Use   Vaping Use: Never used  Substance Use Topics   Alcohol use: Not Currently   Drug use: No     Allergies   Macrobid [nitrofurantoin], Other, Tetanus toxoids, Thimerosal (thiomersal), and Thimerosal   Review of Systems Review of Systems   Physical Exam Triage Vital Signs ED Triage Vitals  Enc Vitals Group     BP 08/31/22 1019 129/84     Pulse Rate 08/31/22 1019 69     Resp 08/31/22 1019 17     Temp 08/31/22 1019 97.8 F (36.6 C)     Temp Source 08/31/22 1019 Oral     SpO2 08/31/22 1019 97 %     Weight --      Height --      Head Circumference --      Peak Flow --      Pain Score 08/31/22 1021 7     Pain Loc --      Pain Edu? --      Excl. in GC? --    No data found.  Updated Vital Signs BP 129/84 (BP Location: Right Arm)   Pulse 69   Temp 97.8 F (36.6 C) (Oral)   Resp 17   LMP  (LMP Unknown)   SpO2 97%   Visual Acuity Right Eye Distance:   Left Eye Distance:   Bilateral Distance:    Right Eye Near:   Left Eye Near:    Bilateral Near:     Physical Exam Vitals and nursing note reviewed.  Constitutional:      Appearance: Normal appearance. She is obese.  HENT:     Head: Normocephalic and atraumatic.     Mouth/Throat:     Mouth: Mucous membranes are moist.     Pharynx: Oropharynx is clear.  Eyes:     Extraocular Movements: Extraocular movements intact.     Conjunctiva/sclera: Conjunctivae normal.     Pupils: Pupils are equal, round, and reactive to  light.  Cardiovascular:     Rate and Rhythm: Normal rate and regular rhythm.     Pulses: Normal pulses.     Heart sounds: Normal heart sounds.  Pulmonary:     Effort: Pulmonary effort is normal.     Breath sounds: Normal breath sounds. No wheezing, rhonchi or rales.  Musculoskeletal:        General: Normal range of motion.     Cervical back: Normal range of motion and neck supple.     Comments: Lumbar sacral spine: Patient reports painful at inner left-sided gluteal cleft through left buttocks/left hip no deformity noted  Skin:    General: Skin is warm and dry.  Neurological:     General: No focal deficit present.     Mental Status: She is alert and oriented to person, place, and time. Mental status is at baseline.  Psychiatric:        Mood and Affect: Mood normal.        Behavior: Behavior normal.        Thought Content: Thought content normal.      UC Treatments / Results  Labs (all labs ordered are listed, but only abnormal results are displayed) Labs Reviewed - No data to display  EKG   Radiology No results found.  Procedures Procedures (including critical care time)  Medications Ordered in UC Medications  ketorolac (TORADOL) injection 60 mg (60 mg Intramuscular Given 08/31/22 1120)  dexamethasone (DECADRON) injection 10 mg (10 mg Intramuscular Given 08/31/22 1121)  Initial Impression / Assessment and Plan / UC Course  I have reviewed the triage vital signs and the nursing notes.  Pertinent labs & imaging results that were available during my care of the patient were reviewed by me and considered in my medical decision making (see chart for details).     MDM: 1.  Acute bilateral low back pain without sciatica-IM Toradol 60 mg, IM Decadron 10 mg given in clinic and prior to discharge; 2.  Muscle strain-Rx'd Sterapred Unipak (tapering from 60 mg to 10 mg over 10 days). Final Clinical Impressions(s) / UC Diagnoses   Final diagnoses:  Acute bilateral low back  pain without sciatica  Muscle strain     Discharge Instructions      Advised patient to take medication as directed with food to completion.  Encouraged patient to avoid activities involving affected side of the left buttocks for the next 7 to 10 days.  Encouraged increase daily water intake to 64 ounces per day while taking this medication.  Advised if symptoms worsen and/or unresolved please follow-up with PCP or here for further evaluation.     ED Prescriptions     Medication Sig Dispense Auth. Provider   predniSONE (STERAPRED UNI-PAK 21 TAB) 10 MG (21) TBPK tablet Take by mouth daily. Take 6 tabs by mouth daily  for 2 days, then 5 tabs for 2 days, then 4 tabs for 2 days, then 3 tabs for 2 days, 2 tabs for 2 days, then 1 tab by mouth daily for 2 days 42 tablet Trevor Iha, FNP      PDMP not reviewed this encounter.   Trevor Iha, FNP 08/31/22 1546

## 2022-09-05 ENCOUNTER — Ambulatory Visit (INDEPENDENT_AMBULATORY_CARE_PROVIDER_SITE_OTHER): Payer: 59 | Admitting: Sports Medicine

## 2022-09-05 ENCOUNTER — Ambulatory Visit (INDEPENDENT_AMBULATORY_CARE_PROVIDER_SITE_OTHER): Payer: 59

## 2022-09-05 DIAGNOSIS — M7918 Myalgia, other site: Secondary | ICD-10-CM

## 2022-09-05 NOTE — Progress Notes (Signed)
    Procedures performed today:    None.  Independent interpretation of notes and tests performed by another provider:   None.  Brief History, Exam, Impression, and Recommendations:    Pain in left buttock Pleasant 52 year old female, she was walking and felt a pop in the left buttock. Was seen in urgent care, diagnosed with a lumbar strain. On exam she has discomfort deep within her left buttock at the ischial tuberosity, she has reproduction of pain with resisted firing of the hamstring. Suspect hamstring strain, I would like some x-rays to rule out an avulsion fracture, she will do a thigh compression with Ace wrap at home, adding some hamstring home physical therapy that she can start in about a week, return to see me in a month.    ____________________________________________ Ihor Austin. Benjamin Stain, M.D., ABFM., CAQSM., AME. Primary Care and Sports Medicine [facility]  Adjunct Professor of Family Medicine  [employer] [place] [employer] [facility] [employer]  Restaurant manager, fast food

## 2022-09-05 NOTE — Patient Instructions (Signed)
Initial Hamstring Rehab Protocol Hamstring curls: Start with 3 sets of 15 (no weight); Progress by 5 reps every 3 days until you reach 3 sets of 30; After 3 days at 3 sets of 30, add 2lb ankle weight at 3 sets of 10; Increase every 5 days by 5 reps. You may add 2lbs ankle weight once weekly. Hamstring swings- swing leg backwards and curl at the end of the swing. Follow same schedule as above. Hamstring running lunges- running lunge position means no more than 45 degrees of knee flexion and running motion. Follow same schedule as above.  Advanced Hamstring Rehab Protocol Plyometrics: bound and land in running position; 3 sets of 15 level first; then up a rise; then down a rise; only advance when level is easy. No greater than 45 deg knee flexion. Stride drills- 10 x 75 yards; bounding but no more than 45 deg knee flexion. Level for 1 week; then add up hill for 1 week; then add down hill. Hop drills- 15 repetitive hops- 3 sets; compare injured to non injured leg Nordic hamstring exercises- stabilize body kneeling;1 set of 5-10 and progress slowly(Don't attempt if painful) 

## 2022-09-05 NOTE — Assessment & Plan Note (Signed)
Pleasant 52 year old female, she was walking and felt a pop in the left buttock. Was seen in urgent care, diagnosed with a lumbar strain. On exam she has discomfort deep within her left buttock at the ischial tuberosity, she has reproduction of pain with resisted firing of the hamstring. Suspect hamstring strain, I would like some x-rays to rule out an avulsion fracture, she will do a thigh compression with Ace wrap at home, adding some hamstring home physical therapy that she can start in about a week, return to see me in a month.

## 2022-09-19 ENCOUNTER — Other Ambulatory Visit: Payer: Self-pay | Admitting: Sports Medicine

## 2022-09-23 ENCOUNTER — Ambulatory Visit (INDEPENDENT_AMBULATORY_CARE_PROVIDER_SITE_OTHER): Payer: 59 | Admitting: Sports Medicine

## 2022-09-23 ENCOUNTER — Other Ambulatory Visit (INDEPENDENT_AMBULATORY_CARE_PROVIDER_SITE_OTHER): Payer: 59

## 2022-09-23 ENCOUNTER — Other Ambulatory Visit: Payer: Self-pay | Admitting: Sports Medicine

## 2022-09-23 DIAGNOSIS — F410 Panic disorder [episodic paroxysmal anxiety] without agoraphobia: Secondary | ICD-10-CM

## 2022-09-23 DIAGNOSIS — M7062 Trochanteric bursitis, left hip: Secondary | ICD-10-CM

## 2022-09-23 DIAGNOSIS — M7918 Myalgia, other site: Secondary | ICD-10-CM

## 2022-09-23 MED ORDER — ENALAPRIL MALEATE 10 MG PO TABS: 10.0000 mg | ORAL_TABLET | Freq: Two times a day (BID) | ORAL | 1 refills | Status: DC

## 2022-09-23 MED ORDER — CLONAZEPAM 0.5 MG PO TABS: 0.5000 mg | ORAL_TABLET | Freq: Two times a day (BID) | ORAL | 3 refills | Status: DC | PRN

## 2022-09-23 MED ORDER — EPINEPHRINE 0.3 MG/0.3ML IJ SOAJ: 0.3000 mg | INTRAMUSCULAR | 1 refills | Status: DC | PRN

## 2022-09-23 NOTE — Progress Notes (Signed)
    Procedures performed today:    Procedure: Real-time Ultrasound Guided injection of the left hip joint Device: Samsung HS60  Verbal informed consent obtained.  Time-out conducted.  Noted no overlying erythema, induration, or other signs of local infection.  Skin prepped in a sterile fashion.  Local anesthesia: Topical Ethyl chloride.  With sterile technique and under real time ultrasound guidance: Minimally arthritic changes, 1 cc Kenalog 40, 2 cc lidocaine, 2 cc bupivacaine injected easily Completed without difficulty  Advised to call if fevers/chills, erythema, induration, drainage, or persistent bleeding.  Images permanently stored and available for review in PACS.  Impression: Technically successful ultrasound guided injection.  Independent interpretation of notes and tests performed by another provider:   None.  Brief History, Exam, Impression, and Recommendations:    Trochanteric bursitis and hip joint arthritis, left Pleasant 52 year old female, known hip osteoarthritis, increasing pain left groin. Last injection was in March, she is going to [place], we repeated the left hip joint injection today, return as needed.  Pain in left buttock Hamstring strain resolved.    ____________________________________________ Ihor Austin. Benjamin Stain, M.D., ABFM., CAQSM., AME. Primary Care and Sports Medicine  [facility]  Adjunct Professor of Family Medicine  [place] [employer] [facility] [employer]  Restaurant manager, fast food

## 2022-09-23 NOTE — Assessment & Plan Note (Signed)
Pleasant 52 year old female, known hip osteoarthritis, increasing pain left groin. Last injection was in March, she is going to [place], we repeated the left hip joint injection today, return as needed.

## 2022-09-23 NOTE — Assessment & Plan Note (Signed)
Hamstring strain resolved.

## 2022-09-27 ENCOUNTER — Other Ambulatory Visit: Payer: Self-pay | Admitting: Sports Medicine

## 2022-09-27 DIAGNOSIS — E782 Mixed hyperlipidemia: Secondary | ICD-10-CM

## 2022-10-06 ENCOUNTER — Encounter: Payer: Self-pay | Admitting: Cardiology

## 2022-10-12 ENCOUNTER — Other Ambulatory Visit: Payer: Self-pay | Admitting: Nurse Practitioner

## 2022-10-12 DIAGNOSIS — E782 Mixed hyperlipidemia: Secondary | ICD-10-CM

## 2022-10-12 DIAGNOSIS — R079 Chest pain, unspecified: Secondary | ICD-10-CM

## 2022-10-12 DIAGNOSIS — R000 Tachycardia, unspecified: Secondary | ICD-10-CM

## 2022-10-12 DIAGNOSIS — Z79899 Other long term (current) drug therapy: Secondary | ICD-10-CM

## 2022-10-12 DIAGNOSIS — I2510 Atherosclerotic heart disease of native coronary artery without angina pectoris: Secondary | ICD-10-CM

## 2022-10-12 MED ORDER — ISOSORBIDE MONONITRATE ER 30 MG PO TB24
15.0000 mg | ORAL_TABLET | Freq: Every day | ORAL | 1 refills | Status: DC
Start: 2022-10-12 — End: 2023-07-17

## 2022-10-21 ENCOUNTER — Ambulatory Visit: Payer: 59 | Admitting: Cardiology

## 2022-11-02 ENCOUNTER — Other Ambulatory Visit: Payer: Self-pay | Admitting: Sports Medicine

## 2022-11-10 ENCOUNTER — Ambulatory Visit
Admission: RE | Admit: 2022-11-10 | Discharge: 2022-11-10 | Disposition: A | Discharge status: Home / Self Care | Payer: 59 | Source: Ambulatory Visit | Attending: Family Medicine | Admitting: Family Medicine

## 2022-11-10 VITALS — BP 125/84 | HR 87 | Temp 98.5°F | Resp 17

## 2022-11-10 DIAGNOSIS — R21 Rash and other nonspecific skin eruption: Secondary | ICD-10-CM

## 2022-11-10 DIAGNOSIS — W57XXXA Bitten or stung by nonvenomous insect and other nonvenomous arthropods, initial encounter: Secondary | ICD-10-CM

## 2022-11-10 MED ORDER — PREDNISONE 10 MG (21) PO TBPK: ORAL_TABLET | Freq: Every day | ORAL | 0 refills | Status: DC

## 2022-11-10 MED ORDER — DOXYCYCLINE HYCLATE 100 MG PO CAPS: 100.0000 mg | ORAL_CAPSULE | Freq: Two times a day (BID) | ORAL | 0 refills | Status: AC

## 2022-11-10 NOTE — ED Triage Notes (Signed)
Pt c/o insect bites to both feet, and RT arm since monday. Unsure as to what bit her. Extremely itchy. Says Sunday night she was cleaning out fish pond, not sure if that's when it happened. Benedryl oral and spray prn. Also has tried Cortizone with no relief.

## 2022-11-10 NOTE — ED Provider Notes (Signed)
[facility]    CSN: [id number] Arrival date & time: 11/10/22  1433      History   Chief Complaint Chief Complaint  Patient presents with   Insect Bite    HPI Sarah Nicholson is a 52 y.o. female.   HPI 52 year old female presents with multiple insect bite to both feet and right arm and extreme itching in my feet.  PMH significant for HTN, HLD, and anxiety.  Past Medical History:  Diagnosis Date   ADD (attention deficit disorder) 06/14/2012   Anxiety    Bronchitis    Complication of anesthesia 1987   states woke up during appedentomy   Depression 06/14/2012   Hepatic steatosis 11/13/2018   Hepatic steatosis with hepatomegaly seen on MRI abd on 11/2018   History of leukocytosis 06/14/2012   Reports "WBC 32" decades ago with negative hematology specialist workup.    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   Abnormal stress test 12/09/2021   Annual physical exam 11/24/2021   Hemiparesis, left (HCC) 11/03/2021   Herpes zoster 08/06/2021   Femoral hernia 03/04/2021   Leukocytosis 01/16/2021   Left lateral abdominal pain 01/15/2021   Lumbar spondylosis 01/15/2021   Trochanteric bursitis and hip joint arthritis, left 12/04/2020   Hepatic steatosis 11/13/2018   Tobacco use 07/18/2018   Irritable bowel syndrome (IBS) 05/04/2017   Hallux valgus 12/09/2014   Angina pectoris (HCC) 07/31/2014   Other and unspecified ovarian cyst 06/25/2013   Left tennis elbow 06/25/2013   Microscopic hematuria  06/27/2012   Hyperlipidemia 06/14/2012   Seasonal allergies 06/14/2012   Depression 06/14/2012   ADD (attention deficit disorder) 06/14/2012    Past Surgical History:  Procedure Laterality Date   APPENDECTOMY     BICEPT TENODESIS Right 10/10/2019   Procedure: BICEPS TENODESIS;  Surgeon: Bjorn Pippin, MD;  Location: [facility];  Service: Orthopedics;  Laterality: Right;   bladder sling/mesh     INGUINAL HERNIA REPAIR     intrauterine ablation     LEFT HEART CATH AND CORONARY ANGIOGRAPHY N/A 12/09/2021   Procedure: LEFT HEART CATH AND CORONARY ANGIOGRAPHY;  Surgeon: Lyn Records, MD;  Location: [facility];  Service: Cardiovascular;  Laterality: N/A;   NASAL SINUS SURGERY     TUBAL LIGATION     VISCERAL ANGIOGRAPHY N/A 02/04/2022   Procedure: VISCERAL ANGIOGRAPHY/Mesentric;  Surgeon: Cephus Shelling, MD;  Location: [facility];  Service: Cardiovascular;  Laterality: N/A;    OB History     Gravida  5   Para  4   Term  2   Preterm  2   AB  1   Living  3      SAB      IAB      Ectopic      Multiple      Live Births               Home Medications    Prior to Admission medications   Medication Sig Start  Date End Date Taking? Authorizing Provider  doxycycline (VIBRAMYCIN) 100 MG capsule Take 1 capsule (100 mg total) by mouth 2 (two) times daily for 10 days. 11/10/22 11/20/22 Yes Trevor Iha, FNP  predniSONE (STERAPRED UNI-PAK 21 TAB) 10 MG (21) TBPK tablet Take by mouth daily. Take 6 tabs by mouth daily  for 2 days, then 5 tabs for 2 days, then 4 tabs for 2 days, then 3 tabs for 2 days, 2 tabs for 2 days, then 1 tab by mouth daily for 2 days 11/10/22  Yes Trevor Iha, FNP  aspirin EC 81 MG tablet Take 1 tablet (81 mg total) by mouth daily. 12/01/21   Monica Becton, MD  cetirizine (ZYRTEC) 10 MG tablet Take 20 mg by mouth daily as needed for allergies ([place]).    [provider]  clonazePAM (KLONOPIN) 0.5 MG  tablet Take 1 tablet (0.5 mg total) by mouth 2 (two) times daily as needed for anxiety. 09/23/22   Monica Becton, MD  enalapril (VASOTEC) 10 MG tablet Take 1 tablet (10 mg total) by mouth 2 (two) times daily. 09/23/22   Monica Becton, MD  EPINEPHrine 0.3 mg/0.3 mL IJ SOAJ injection Inject 0.3 mg into the muscle as needed for anaphylaxis. 09/23/22   Monica Becton, MD  escitalopram (LEXAPRO) 20 MG tablet Take 2 tablets (40 mg total) by mouth daily. 07/14/21   Monica Becton, MD  ezetimibe (ZETIA) 10 MG tablet Take 1 tablet (10 mg total) by mouth daily. 02/02/22   Meriam Sprague, MD  gabapentin (NEURONTIN) 300 MG capsule TAKE 1 CAPSULE(300 MG) BY MOUTH THREE TIMES DAILY 11/02/22   Monica Becton, MD  Hyoscyamine Sulfate SL (LEVSIN/SL) 0.125 MG SUBL Dissolve one tablet every 4-6 hours as needed for abdominal pain/spasms 03/18/22   Esterwood, Amy S, PA-C  isosorbide mononitrate (IMDUR) 30 MG 24 hr tablet Take 0.5 tablets (15 mg total) by mouth daily. CAN TAKE AN ADDITIONAL HALF TABLET AS NEEDED FOR CHEST PAIN 10/12/22   Meriam Sprague, MD  metFORMIN (GLUCOPHAGE-XR) 750 MG 24 hr tablet Take 1 tablet (750 mg total) by mouth daily with breakfast. 02/15/22   Monica Becton, MD  nicotine (NICODERM CQ - DOSED IN MG/24 HOURS) 21 mg/24hr patch Place 1 patch (21 mg total) onto the skin daily. 01/31/22   Meriam Sprague, MD  nitroGLYCERIN (NITROSTAT) 0.4 MG SL tablet Place 1 tablet (0.4 mg total) under the tongue every 5 (five) minutes as needed for chest pain. 12/30/21   Gaston Islam., NP  rosuvastatin (CRESTOR) 20 MG tablet TAKE 1 TABLET(20 MG) BY MOUTH DAILY 09/27/22   Monica Becton, MD  Semaglutide 14 MG TABS Take 1 tablet (14 mg total) by mouth daily. 05/17/22   Monica Becton, MD    Family History Family History  Problem Relation Age of Onset   Depression Mother    Diabetes Father    Hyperlipidemia Father    Hypertension Father     Depression Sister    Breast cancer Maternal Grandmother    Crohn's disease Maternal Grandmother    High blood pressure Paternal Grandfather    Diabetes Paternal Grandfather    Depression Son    Sleep apnea Neg Hx    Liver disease Neg Hx    Esophageal cancer Neg Hx    Stomach cancer Neg Hx     Social History Social History   Tobacco Use   Smoking status: Every Day    Current packs/day:  1.00    Types: Cigarettes   Smokeless tobacco: Never  Vaping Use   Vaping status: Never Used  Substance Use Topics   Alcohol use: Not Currently   Drug use: No     Allergies   Macrobid [nitrofurantoin], Other, Tetanus toxoids, Thimerosal (thiomersal), and Thimerosal   Review of Systems Review of Systems  Skin:  Positive for rash.     Physical Exam Triage Vital Signs ED Triage Vitals  Encounter Vitals Group     BP      Systolic BP Percentile      Diastolic BP Percentile      Pulse      Resp      Temp      Temp src      SpO2      Weight      Height      Head Circumference      Peak Flow      Pain Score      Pain Loc      Pain Education      Exclude from Growth Chart    No data found.  Updated Vital Signs BP 125/84 (BP Location: Right Arm)   Pulse 87   Temp 98.5 F (36.9 C) (Oral)   Resp 17   LMP  (LMP Unknown)   SpO2 94%      Physical Exam Vitals and nursing note reviewed.  Constitutional:      Appearance: Normal appearance. She is normal weight.  HENT:     Head: Normocephalic and atraumatic.     Mouth/Throat:     Mouth: Mucous membranes are moist.     Pharynx: Oropharynx is clear.  Eyes:     Extraocular Movements: Extraocular movements intact.     Conjunctiva/sclera: Conjunctivae normal.     Pupils: Pupils are equal, round, and reactive to light.  Cardiovascular:     Rate and Rhythm: Normal rate and regular rhythm.     Pulses: Normal pulses.     Heart sounds: Normal heart sounds.  Pulmonary:     Effort: Pulmonary effort is normal.     Breath  sounds: Normal breath sounds. No wheezing or rhonchi.  Musculoskeletal:        General: Normal range of motion.     Cervical back: Normal range of motion and neck supple.  Skin:    General: Skin is warm and dry.     Comments: Bilateral feet (dorsum): Multiple erythematous insect puncture marks noted with surrounding erythema; patient reports extremely pruritic  Neurological:     General: No focal deficit present.     Mental Status: She is alert and oriented to person, place, and time. Mental status is at baseline.  Psychiatric:        Mood and Affect: Mood normal.        Behavior: Behavior normal.        Thought Content: Thought content normal.      UC Treatments / Results  Labs (all labs ordered are listed, but only abnormal results are displayed) Labs Reviewed - No data to display  EKG   Radiology No results found.  Procedures Procedures (including critical care time)  Medications Ordered in UC Medications - No data to display  Initial Impression / Assessment and Plan / UC Course  I have reviewed the triage vital signs and the nursing notes.  Pertinent labs & imaging results that were available during my care of the patient were reviewed by me and considered  in my medical decision making (see chart for details).     MDM: 1.  Rash and nonspecific skin eruption-Rx'd Sterapred Unipak (tapering from 60 mg to 10 mg over 10 days),: 2.  Bug bite with infection, initial encounter-Rx'd Doxycycline 100 mg capsule twice daily x 10 days. Advised patient to take medications as directed with food to completion.  Encouraged increase daily water intake to 64 ounces per day while taking these medications.  Advised if symptoms worsen and/or unresolved please follow-up PCP or here for further evaluation.  Patient discharged home, hemodynamically stable. Final Clinical Impressions(s) / UC Diagnoses   Final diagnoses:  Bug bite with infection, initial encounter  Rash and nonspecific skin  eruption     Discharge Instructions      Advised patient to take medications as directed with food to completion.  Encouraged increase daily water intake to 64 ounces per day while taking these medications.  Advised if symptoms worsen and/or unresolved please follow-up PCP or here for further evaluation.     ED Prescriptions     Medication Sig Dispense Auth. Provider   predniSONE (STERAPRED UNI-PAK 21 TAB) 10 MG (21) TBPK tablet Take by mouth daily. Take 6 tabs by mouth daily  for 2 days, then 5 tabs for 2 days, then 4 tabs for 2 days, then 3 tabs for 2 days, 2 tabs for 2 days, then 1 tab by mouth daily for 2 days 42 tablet Trevor Iha, FNP   doxycycline (VIBRAMYCIN) 100 MG capsule Take 1 capsule (100 mg total) by mouth 2 (two) times daily for 10 days. 20 capsule Trevor Iha, FNP      PDMP not reviewed this encounter.   Trevor Iha, FNP 11/10/22 1544

## 2022-11-10 NOTE — Discharge Instructions (Addendum)
Advised patient to take medications as directed with food to completion.  Encouraged increase daily water intake to 64 ounces per day while taking these medications.  Advised if symptoms worsen and/or unresolved please follow-up PCP or here for further evaluation.

## 2022-11-11 ENCOUNTER — Telehealth: Payer: Self-pay | Admitting: Sports Medicine

## 2022-11-11 DIAGNOSIS — F411 Generalized anxiety disorder: Secondary | ICD-10-CM

## 2022-11-11 DIAGNOSIS — F32A Depression, unspecified: Secondary | ICD-10-CM

## 2022-11-11 MED ORDER — ESCITALOPRAM OXALATE 20 MG PO TABS: 40.0000 mg | ORAL_TABLET | Freq: Every day | ORAL | 3 refills | Status: DC

## 2022-11-11 NOTE — Telephone Encounter (Signed)
Refilled

## 2022-11-11 NOTE — Telephone Encounter (Signed)
Prescription Request  11/11/2022  LOV: 09/23/2022  What is the name of the medication or equipment? escitalopram (LEXAPRO) 20 MG tablet   Have you contacted your pharmacy to request a refill? Yes   Which pharmacy would you like this sent to?  [id number] - [place], [place] - [address] [place] [address] Phone: [phone] Fax: [phone]    Patient notified that their request is being sent to the clinical staff for review and that they should receive a response within 2 business days.   Please advise at [facility] [phone]

## 2022-11-17 ENCOUNTER — Other Ambulatory Visit: Payer: Self-pay

## 2022-11-17 DIAGNOSIS — F411 Generalized anxiety disorder: Secondary | ICD-10-CM

## 2022-11-17 DIAGNOSIS — F32A Depression, unspecified: Secondary | ICD-10-CM

## 2022-11-17 MED ORDER — ESCITALOPRAM OXALATE 20 MG PO TABS
40.0000 mg | ORAL_TABLET | Freq: Every day | ORAL | 3 refills | Status: DC
Start: 2022-11-17 — End: 2023-08-28

## 2022-11-27 ENCOUNTER — Other Ambulatory Visit: Payer: Self-pay | Admitting: Sports Medicine

## 2022-11-29 ENCOUNTER — Other Ambulatory Visit: Payer: Self-pay | Admitting: Family Medicine

## 2022-11-29 ENCOUNTER — Ambulatory Visit (INDEPENDENT_AMBULATORY_CARE_PROVIDER_SITE_OTHER): Payer: Self-pay

## 2022-11-29 DIAGNOSIS — T148XXA Other injury of unspecified body region, initial encounter: Secondary | ICD-10-CM

## 2022-11-29 DIAGNOSIS — S67195A Crushing injury of left ring finger, initial encounter: Secondary | ICD-10-CM

## 2022-12-09 ENCOUNTER — Ambulatory Visit
Admission: EM | Admit: 2022-12-09 | Discharge: 2022-12-09 | Disposition: A | Discharge status: Home / Self Care | Payer: 59 | Attending: Family Medicine | Admitting: Family Medicine

## 2022-12-09 ENCOUNTER — Other Ambulatory Visit: Payer: Self-pay

## 2022-12-09 DIAGNOSIS — F17210 Nicotine dependence, cigarettes, uncomplicated: Secondary | ICD-10-CM | POA: Diagnosis not present

## 2022-12-09 DIAGNOSIS — R3915 Urgency of urination: Secondary | ICD-10-CM | POA: Diagnosis present

## 2022-12-09 DIAGNOSIS — R300 Dysuria: Secondary | ICD-10-CM | POA: Diagnosis present

## 2022-12-09 DIAGNOSIS — R319 Hematuria, unspecified: Secondary | ICD-10-CM | POA: Insufficient documentation

## 2022-12-09 DIAGNOSIS — N390 Urinary tract infection, site not specified: Secondary | ICD-10-CM | POA: Diagnosis not present

## 2022-12-09 LAB — POCT URINALYSIS DIP (MANUAL ENTRY)
Bilirubin, UA: NEGATIVE
Glucose, UA: NEGATIVE mg/dL
Ketones, POC UA: NEGATIVE mg/dL
Leukocytes, UA: NEGATIVE
Nitrite, UA: NEGATIVE
Protein Ur, POC: NEGATIVE mg/dL
Spec Grav, UA: <=1.005 — AB (ref 1.010–1.025)
Urobilinogen, UA: 0.2 U/dL
pH, UA: 5.5 (ref 5.0–8.0)

## 2022-12-09 MED ORDER — LEVOFLOXACIN 750 MG PO TABS: ORAL_TABLET | ORAL | 0 refills | Status: DC

## 2022-12-09 NOTE — ED Triage Notes (Signed)
Burning urination and urgency since Monday

## 2022-12-09 NOTE — ED Provider Notes (Signed)
[facility]    CSN: [id number] Arrival date & time: 12/09/22  1536      History   Chief Complaint No chief complaint on file.   HPI Sarah Nicholson Sarah Nicholson is a 52 y.o. female.   HPI  Past Medical History:  Diagnosis Date   ADD (attention deficit disorder) 06/14/2012   Anxiety    Bronchitis    Complication of anesthesia 1987   states woke up during appedentomy   Depression 06/14/2012   Hepatic steatosis 11/13/2018   Hepatic steatosis with hepatomegaly seen on MRI abd on 11/2018   History of leukocytosis 06/14/2012   Reports "WBC 32" decades ago with negative hematology specialist workup.    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   Abnormal stress test 12/09/2021   Annual physical exam 11/24/2021   Hemiparesis, left (HCC) 11/03/2021   Herpes zoster 08/06/2021   Femoral hernia 03/04/2021   Leukocytosis 01/16/2021   Left lateral abdominal pain 01/15/2021   Lumbar spondylosis 01/15/2021   Trochanteric bursitis and hip joint arthritis, left 12/04/2020   Hepatic steatosis 11/13/2018   Tobacco use 07/18/2018   Irritable bowel syndrome (IBS) 05/04/2017   Hallux valgus 12/09/2014   Angina pectoris (HCC) 07/31/2014   Other and unspecified ovarian cyst 06/25/2013   Left tennis elbow 06/25/2013   Microscopic hematuria 06/27/2012   Hyperlipidemia 06/14/2012   Seasonal allergies 06/14/2012   Depression 06/14/2012   ADD (attention deficit disorder) 06/14/2012    Past Surgical History:  Procedure  Laterality Date   APPENDECTOMY     BICEPT TENODESIS Right 10/10/2019   Procedure: BICEPS TENODESIS;  Surgeon: Bjorn Pippin, MD;  [facility];  Service: Orthopedics;  Laterality: Right;   bladder sling/mesh     INGUINAL HERNIA REPAIR     intrauterine ablation     LEFT HEART CATH AND CORONARY ANGIOGRAPHY N/A 12/09/2021   Procedure: LEFT HEART CATH AND CORONARY ANGIOGRAPHY;  Surgeon: Lyn Records, MD;  Location: [facility];  Service: Cardiovascular;  Laterality: N/A;   NASAL SINUS SURGERY     TUBAL LIGATION     VISCERAL ANGIOGRAPHY N/A 02/04/2022   Procedure: VISCERAL ANGIOGRAPHY/Mesentric;  Surgeon: Cephus Shelling, MD;  Location: [facility];  Service: Cardiovascular;  Laterality: N/A;    OB History     Gravida  5   Para  4   Term  2   Preterm  2   AB  1   Living  3      SAB      IAB      Ectopic      Multiple      Live Births               Home Medications    Prior to Admission medications   Medication Sig Start Date End Date Taking? Authorizing Provider  levofloxacin (LEVAQUIN) 750 MG tablet Take one tab PO daily 12/09/22  Yes Lattie Haw, MD  aspirin EC 81 MG tablet Take 1 tablet (  81 mg total) by mouth daily. 12/01/21   Monica Becton, MD  cetirizine (ZYRTEC) 10 MG tablet Take 20 mg by mouth daily as needed for allergies ([place]).    [provider]  clonazePAM (KLONOPIN) 0.5 MG tablet Take 1 tablet (0.5 mg total) by mouth 2 (two) times daily as needed for anxiety. 09/23/22   Monica Becton, MD  enalapril (VASOTEC) 10 MG tablet TAKE 1 TABLET(10 MG) BY MOUTH TWICE DAILY 11/27/22   Monica Becton, MD  EPINEPHrine 0.3 mg/0.3 mL IJ SOAJ injection Inject 0.3 mg into the muscle as needed for anaphylaxis. 09/23/22   Monica Becton, MD  escitalopram (LEXAPRO) 20 MG tablet Take 2 tablets (40 mg total) by mouth daily. 11/17/22   Monica Becton, MD  ezetimibe (ZETIA) 10 MG  tablet Take 1 tablet (10 mg total) by mouth daily. 02/02/22   Meriam Sprague, MD  gabapentin (NEURONTIN) 300 MG capsule TAKE 1 CAPSULE(300 MG) BY MOUTH THREE TIMES DAILY 11/02/22   Monica Becton, MD  Hyoscyamine Sulfate SL (LEVSIN/SL) 0.125 MG SUBL Dissolve one tablet every 4-6 hours as needed for abdominal pain/spasms 03/18/22   Esterwood, Amy S, PA-C  isosorbide mononitrate (IMDUR) 30 MG 24 hr tablet Take 0.5 tablets (15 mg total) by mouth daily. CAN TAKE AN ADDITIONAL HALF TABLET AS NEEDED FOR CHEST PAIN 10/12/22   Meriam Sprague, MD  metFORMIN (GLUCOPHAGE-XR) 750 MG 24 hr tablet Take 1 tablet (750 mg total) by mouth daily with breakfast. 02/15/22   Monica Becton, MD  nicotine (NICODERM CQ - DOSED IN MG/24 HOURS) 21 mg/24hr patch Place 1 patch (21 mg total) onto the skin daily. 01/31/22   Meriam Sprague, MD  nitroGLYCERIN (NITROSTAT) 0.4 MG SL tablet Place 1 tablet (0.4 mg total) under the tongue every 5 (five) minutes as needed for chest pain. 12/30/21   Gaston Islam., NP  predniSONE (STERAPRED UNI-PAK 21 TAB) 10 MG (21) TBPK tablet Take by mouth daily. Take 6 tabs by mouth daily  for 2 days, then 5 tabs for 2 days, then 4 tabs for 2 days, then 3 tabs for 2 days, 2 tabs for 2 days, then 1 tab by mouth daily for 2 days 11/10/22   Trevor Iha, FNP  rosuvastatin (CRESTOR) 20 MG tablet TAKE 1 TABLET(20 MG) BY MOUTH DAILY 09/27/22   Monica Becton, MD  Semaglutide 14 MG TABS Take 1 tablet (14 mg total) by mouth daily. 05/17/22   Monica Becton, MD    Family History Family History  Problem Relation Age of Onset   Depression Mother    Diabetes Father    Hyperlipidemia Father    Hypertension Father    Depression Sister    Breast cancer Maternal Grandmother    Crohn's disease Maternal Grandmother    High blood pressure Paternal Grandfather    Diabetes Paternal Grandfather    Depression Son    Sleep apnea Neg Hx    Liver disease Neg Hx     Esophageal cancer Neg Hx    Stomach cancer Neg Hx     Social History Social History   Tobacco Use   Smoking status: Every Day    Current packs/day: 1.00    Types: Cigarettes   Smokeless tobacco: Never  Vaping Use   Vaping status: Never Used  Substance Use Topics   Alcohol use: Not Currently   Drug use: No     Allergies   Macrobid [nitrofurantoin], Other, Tetanus  toxoids, Thimerosal (thiomersal), and Thimerosal   Review of Systems Review of Systems   Physical Exam Triage Vital Signs ED Triage Vitals  Encounter Vitals Group     BP 12/09/22 1540 133/84     Systolic BP Percentile --      Diastolic BP Percentile --      Pulse Rate 12/09/22 1540 66     Resp 12/09/22 1540 16     Temp 12/09/22 1540 98.3 F (36.8 C)     Temp Source 12/09/22 1540 Oral     SpO2 12/09/22 1540 99 %     Weight --      Height --      Head Circumference --      Peak Flow --      Pain Score 12/09/22 1543 6     Pain Loc --      Pain Education --      Exclude from Growth Chart --    No data found.  Updated Vital Signs BP 133/84 (BP Location: Left Arm)   Pulse 66   Temp 98.3 F (36.8 C) (Oral)   Resp 16   LMP  (LMP Unknown)   SpO2 99%   Visual Acuity Right Eye Distance:   Left Eye Distance:   Bilateral Distance:    Right Eye Near:   Left Eye Near:    Bilateral Near:     Physical Exam   UC Treatments / Results  Labs (all labs ordered are listed, but only abnormal results are displayed) Labs Reviewed  POCT URINALYSIS DIP (MANUAL ENTRY) - Abnormal; Notable for the following components:      Result Value   Spec Grav, UA <=1.005 (*)    Blood, UA large (*)    All other components within normal limits  URINE CULTURE    EKG   Radiology No results found.  Procedures Procedures (including critical care time)  Medications Ordered in UC Medications - No data to display  Initial Impression / Assessment and Plan / UC Course  I have reviewed the triage vital signs and  the nursing notes.  Pertinent labs & imaging results that were available during my care of the patient were reviewed by me and considered in my medical decision making (see chart for details).    Suspect early left pyelonephritis. Urine culture pending. Begin Levaquin 750 one daily. Followup with Family Doctor if not improved in four days.  Final Clinical Impressions(s) / UC Diagnoses   Final diagnoses:  Urinary tract infection with hematuria, site unspecified     Discharge Instructions      Increase fluid intake.  May use non-prescription AZO for about two days, if desired, to decrease urinary discomfort.  If symptoms become significantly worse during the night or over the weekend, proceed to the local emergency room.     ED Prescriptions     Medication Sig Dispense Auth. Provider   levofloxacin (LEVAQUIN) 750 MG tablet Take one tab PO daily 7 tablet Lattie Haw, MD      PDMP not reviewed this encounter.

## 2022-12-09 NOTE — Discharge Instructions (Signed)
Increase fluid intake. May use non-prescription AZO for about two days, if desired, to decrease urinary discomfort.  If symptoms become significantly worse during the night or over the weekend, proceed to the local emergency room.  

## 2022-12-12 ENCOUNTER — Telehealth: Payer: Self-pay

## 2022-12-12 DIAGNOSIS — R350 Frequency of micturition: Secondary | ICD-10-CM

## 2022-12-12 LAB — URINE CULTURE: Special Requests: NORMAL

## 2022-12-12 NOTE — Telephone Encounter (Signed)
Pt called stating not getting any better on abx. Culture states multiple species present, suggest recollection. Per Provider, have pt come back in for recheck of urine. Pt verbalized understanding and will return to clinic today for recollect.

## 2022-12-13 ENCOUNTER — Ambulatory Visit: Payer: 59 | Admitting: Dermatology

## 2022-12-13 LAB — URINE CULTURE: Culture: NO GROWTH

## 2023-01-20 NOTE — Progress Notes (Unsigned)
Last Mammogram: 01/07/22 Last Pap Smear:  01/20/22- negative Last Colon Screening;  2023- normal Seat Belts:   yes Sun Screen:   yes Dental Check Up:  yes Brush & Floss:  yes

## 2023-01-23 ENCOUNTER — Ambulatory Visit: Payer: 59 | Admitting: Obstetrics & Gynecology

## 2023-01-23 ENCOUNTER — Encounter: Payer: Self-pay | Admitting: Obstetrics & Gynecology

## 2023-01-23 VITALS — BP 129/85 | HR 78 | Ht 66.0 in | Wt 197.0 lb

## 2023-01-23 DIAGNOSIS — Z01419 Encounter for gynecological examination (general) (routine) without abnormal findings: Secondary | ICD-10-CM | POA: Diagnosis not present

## 2023-01-23 DIAGNOSIS — R159 Full incontinence of feces: Secondary | ICD-10-CM | POA: Diagnosis not present

## 2023-01-23 DIAGNOSIS — N941 Unspecified dyspareunia: Secondary | ICD-10-CM

## 2023-01-23 DIAGNOSIS — R6882 Decreased libido: Secondary | ICD-10-CM

## 2023-01-23 DIAGNOSIS — N951 Menopausal and female climacteric states: Secondary | ICD-10-CM | POA: Diagnosis not present

## 2023-01-23 NOTE — Progress Notes (Signed)
Subjective:     Sarah Nicholson is a 52 y.o. female here for a routine exam.  Current complaints: night sweats (not doing better with Neurontin 600 mg nightly) and on Lexapro; decreased libido and pain during and after intercourse; leaking stool after a large BM 2 months ago--very traumatic BM.   Gynecologic History No LMP recorded (lmp unknown). Patient is postmenopausal. Contraception: post menopausal status Last Mammogram: 01/07/22 Last Pap Smear:  01/20/22- negative Last Colon Screening;  2023- normal Seat Belts:   yes Sun Screen:   yes Dental Check Up:  yes Brush & Floss:  yes     Obstetric History OB History  Gravida Para Term Preterm AB Living  5 4 2 2 1 3   SAB IAB Ectopic Multiple Live Births               # Outcome Date GA Lbr Len/2nd Weight Sex Type Anes PTL Lv  5 AB           4 Preterm           3 Preterm           2 Term           1 Term              The following portions of the patient's history were reviewed and updated as appropriate: allergies, current medications, past family history, past medical history, past social history, past surgical history, and problem list.  Review of Systems Pertinent items noted in HPI and remainder of comprehensive ROS otherwise negative.    Objective:     Vitals:   01/23/23 1414  BP: 129/85  Pulse: 78  Weight: 197 lb (89.4 kg)  Height: 5\' 6"  (1.676 m)   Vitals:  WNL General appearance: alert, cooperative and no distress  HEENT: Normocephalic, without obvious abnormality, atraumatic Eyes: negative Throat: lips, mucosa, and tongue normal; teeth and gums normal  Respiratory: Clear to auscultation bilaterally  CV: Regular rate and rhythm  Breasts:  Normal appearance, no masses or tenderness, no nipple retraction or dimpling  GI: Soft, non-tender; bowel sounds normal; no masses,  no organomegaly  GU: External Genitalia:  Tanner V, no lesion Urethra:  No prolapse   Vagina: Pink, normal rugae, no blood or  discharge  Cervix: No CMT, no lesion  Uterus:  Normal size and contour, non tender  Adnexa: Normal, no masses, non tender  Musculoskeletal: No edema, redness or tenderness in the calves or thighs  Skin: No lesions or rash  Lymphatic: Axillary adenopathy: none     Psychiatric: Normal mood and behavior        Assessment:    Healthy female exam.  Decreased libdio Dyspareunia Fecal incontinence Night sweats   Plan:    Night sweats:  declines HRT due to hx of family breast cancer and +tobacco Decreased libido and pai nwith intercourse:--Vaginal moisturizers reveiwed and handouts given.  Discussed HRT versus increasing Neurontin.  Patient will increase Neurontin to 900 mg in the evening.  If this does not work we can look at moving to  fezolinetan Incontinence of feces:  referral to pelvic PT

## 2023-01-24 ENCOUNTER — Other Ambulatory Visit: Payer: Self-pay

## 2023-01-24 MED ORDER — EZETIMIBE 10 MG PO TABS: 10.0000 mg | ORAL_TABLET | Freq: Every day | ORAL | 0 refills | Status: DC

## 2023-01-30 ENCOUNTER — Other Ambulatory Visit (INDEPENDENT_AMBULATORY_CARE_PROVIDER_SITE_OTHER): Payer: 59

## 2023-01-30 ENCOUNTER — Ambulatory Visit (INDEPENDENT_AMBULATORY_CARE_PROVIDER_SITE_OTHER): Payer: 59 | Admitting: Sports Medicine

## 2023-01-30 DIAGNOSIS — R079 Chest pain, unspecified: Secondary | ICD-10-CM | POA: Diagnosis not present

## 2023-01-30 DIAGNOSIS — M7062 Trochanteric bursitis, left hip: Secondary | ICD-10-CM | POA: Diagnosis not present

## 2023-01-30 DIAGNOSIS — E119 Type 2 diabetes mellitus without complications: Secondary | ICD-10-CM

## 2023-01-30 MED ORDER — TRIAMCINOLONE ACETONIDE 40 MG/ML IJ SUSP
40.0000 mg | Freq: Once | INTRAMUSCULAR | Status: AC
Start: 2023-01-30 — End: 2023-01-30
  Administered 2023-01-30: 40 mg via INTRAMUSCULAR

## 2023-01-30 MED ORDER — ENALAPRIL MALEATE 10 MG PO TABS: 10.0000 mg | ORAL_TABLET | Freq: Two times a day (BID) | ORAL | 1 refills | Status: DC

## 2023-01-30 MED ORDER — METFORMIN HCL ER 750 MG PO TB24
750.0000 mg | ORAL_TABLET | Freq: Every day | ORAL | 11 refills | Status: AC
Start: 2023-01-30 — End: ?

## 2023-01-30 MED ORDER — ASPIRIN 81 MG PO TBEC
81.0000 mg | DELAYED_RELEASE_TABLET | Freq: Every day | ORAL | 3 refills | Status: AC
Start: 2023-01-30 — End: ?

## 2023-01-30 NOTE — Assessment & Plan Note (Signed)
Pleasant 52 year old female, increasing pain left hip at the groin, last injected the joint in July, repeat injection today, return to see me as needed.

## 2023-01-30 NOTE — Addendum Note (Signed)
Addended by: Carren Rang A on: 01/30/2023 01:23 PM   Modules accepted: Orders

## 2023-01-30 NOTE — Addendum Note (Signed)
Addended by: Carren Rang A on: 01/30/2023 10:25 AM   Modules accepted: Orders

## 2023-01-30 NOTE — Progress Notes (Signed)
    Procedures performed today:    Procedure: Real-time Ultrasound Guided injection of the left hip joint Device: Samsung HS60  Verbal informed consent obtained.  Time-out conducted.  Noted no overlying erythema, induration, or other signs of local infection.  Skin prepped in a sterile fashion.  Local anesthesia: Topical Ethyl chloride.  With sterile technique and under real time ultrasound guidance: Arthritic joint noted, 1 cc Kenalog 40, 2 cc lidocaine, 2 cc bupivacaine injected easily Completed without difficulty  Advised to call if fevers/chills, erythema, induration, drainage, or persistent bleeding.  Images permanently stored and available for review in PACS.  Impression: Technically successful ultrasound guided injection.  Independent interpretation of notes and tests performed by another provider:   None.  Brief History, Exam, Impression, and Recommendations:    Trochanteric bursitis and hip joint arthritis, left Pleasant 52 year old female, increasing pain left hip at the groin, last injected the joint in July, repeat injection today, return to see me as needed.    ____________________________________________ Ihor Austin. Benjamin Stain, M.D., ABFM., CAQSM., AME. Primary Care and Sports Medicine  [facility]  Adjunct Professor of Family Medicine  [place] [employer] [facility] [employer]  Restaurant manager, fast food

## 2023-02-02 ENCOUNTER — Encounter: Payer: Self-pay | Admitting: Sports Medicine

## 2023-02-06 ENCOUNTER — Telehealth: Payer: Self-pay

## 2023-02-06 ENCOUNTER — Ambulatory Visit
Admission: EM | Admit: 2023-02-06 | Discharge: 2023-02-06 | Disposition: A | Discharge status: Home / Self Care | Payer: 59 | Attending: Family Medicine | Admitting: Family Medicine

## 2023-02-06 ENCOUNTER — Encounter: Payer: Self-pay | Admitting: Emergency Medicine

## 2023-02-06 DIAGNOSIS — R509 Fever, unspecified: Secondary | ICD-10-CM | POA: Diagnosis not present

## 2023-02-06 LAB — COMPREHENSIVE METABOLIC PANEL
ALT: 11 [IU]/L (ref 0–32)
AST: 10 [IU]/L (ref 0–40)
Albumin: 3.9 g/dL (ref 3.8–4.9)
Alkaline Phosphatase: 81 [IU]/L (ref 44–121)
BUN/Creatinine Ratio: 20 (ref 9–23)
BUN: 20 mg/dL (ref 6–24)
Bilirubin Total: <0.2 mg/dL (ref 0.0–1.2)
CO2: 26 mmol/L (ref 20–29)
Calcium: 9.3 mg/dL (ref 8.7–10.2)
Chloride: 107 mmol/L — ABNORMAL HIGH (ref 96–106)
Creatinine, Ser: 1.02 mg/dL — ABNORMAL HIGH (ref 0.57–1.00)
Globulin, Total: 2.3 g/dL (ref 1.5–4.5)
Glucose: 75 mg/dL (ref 70–99)
Potassium: 5.6 mmol/L — ABNORMAL HIGH (ref 3.5–5.2)
Sodium: 142 mmol/L (ref 134–144)
Total Protein: 6.2 g/dL (ref 6.0–8.5)
eGFR: 65 mL/min/{1.73_m2} (ref >59)

## 2023-02-06 LAB — CBC WITH DIFFERENTIAL/PLATELET
Basophils Absolute: 0 10*3/uL (ref 0.0–0.2)
Basos: 0 %
EOS (ABSOLUTE): 0.2 10*3/uL (ref 0.0–0.4)
Eos: 1 %
Hematocrit: 47.4 % — ABNORMAL HIGH (ref 34.0–46.6)
Hemoglobin: 15.3 g/dL (ref 11.1–15.9)
Immature Granulocytes: 0 %
Lymphocytes Absolute: 4.5 10*3/uL — ABNORMAL HIGH (ref 0.7–3.1)
Lymphs: 27 %
MCH: 30 pg (ref 26.6–33.0)
MCHC: 32.3 g/dL (ref 31.5–35.7)
MCV: 93 fL (ref 79–97)
Monocytes Absolute: 1.2 10*3/uL — ABNORMAL HIGH (ref 0.1–0.9)
Monocytes: 7 %
Neutrophils Absolute: 10.5 10*3/uL — ABNORMAL HIGH (ref 1.4–7.0)
Neutrophils: 65 %
Platelets: 323 10*3/uL (ref 150–450)
RBC: 5.1 x10E6/uL (ref 3.77–5.28)
RDW: 13.3 % (ref 11.7–15.4)
WBC: 16.4 10*3/uL — ABNORMAL HIGH (ref 3.4–10.8)

## 2023-02-06 NOTE — ED Provider Notes (Signed)
[facility]    CSN: [id number] Arrival date & time: 02/06/23  0846      History   Chief Complaint Chief Complaint  Patient presents with   Fever    HPI Sarah Nicholson is a 52 y.o. female.   HPI 52 year old female presents with fever for 1 week low-grade.  Reports low-grade fever since hip injection on 01/30/2023 with PCP.  Patient reports feeling fatigued.  PMH significant for obesity, HTN, current everyday cigarette smoker, and HLD.  Patient reports having a left hip injection performed with PCP/orthopedics on 01/30/2023.  Reports fever has been present since this injection.  Patient reports has contacted provider who performed injection.  Past Medical History:  Diagnosis Date   ADD (attention deficit disorder) 06/14/2012   Anxiety    Bronchitis    Complication of anesthesia 1987   states woke up during appedentomy   Depression 06/14/2012   Hepatic steatosis 11/13/2018   Hepatic steatosis with hepatomegaly seen on MRI abd on 11/2018   History of leukocytosis 06/14/2012   Reports "WBC 32" decades ago with negative hematology specialist workup.    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   Abnormal stress test 12/09/2021   Hemiparesis, left (HCC) 11/03/2021   Herpes zoster 08/06/2021   Femoral hernia 03/04/2021   Leukocytosis 01/16/2021   Left lateral abdominal pain 01/15/2021   Lumbar spondylosis 01/15/2021   Trochanteric bursitis and hip joint arthritis, left 12/04/2020   Hepatic  steatosis 11/13/2018   Tobacco use 07/18/2018   Irritable bowel syndrome (IBS) 05/04/2017   Hallux valgus 12/09/2014   Angina pectoris (HCC) 07/31/2014   Other and unspecified ovarian cyst 06/25/2013   Left tennis elbow 06/25/2013   Microscopic hematuria 06/27/2012   Hyperlipidemia 06/14/2012   Seasonal allergies 06/14/2012   Depression 06/14/2012   ADD (attention deficit disorder) 06/14/2012    Past Surgical History:  Procedure Laterality Date   APPENDECTOMY     BICEPT TENODESIS Right 10/10/2019   Procedure: BICEPS TENODESIS;  Surgeon: Bjorn Pippin, MD;  Location: [facility];  Service: Orthopedics;  Laterality: Right;   bladder sling/mesh     INGUINAL HERNIA REPAIR     intrauterine ablation     LEFT HEART CATH AND CORONARY ANGIOGRAPHY N/A 12/09/2021   Procedure: LEFT HEART CATH AND CORONARY ANGIOGRAPHY;  Surgeon: Lyn Records, MD;  Location: [facility];  Service: Cardiovascular;  Laterality: N/A;   NASAL SINUS SURGERY     TUBAL LIGATION     VISCERAL ANGIOGRAPHY N/A 02/04/2022   Procedure: VISCERAL ANGIOGRAPHY/Mesentric;  Surgeon: Cephus Shelling, MD;  Location: [facility];  Service: Cardiovascular;  Laterality: N/A;    OB History     Gravida  5   Para  4   Term  2   Preterm  2   AB  1   Living  3      SAB      IAB      Ectopic      Multiple  Live Births               Home Medications    Prior to Admission medications   Medication Sig Start Date End Date Taking? Authorizing Provider  aspirin EC 81 MG tablet Take 1 tablet (81 mg total) by mouth daily. 01/30/23  Yes Monica Becton, MD  cetirizine (ZYRTEC) 10 MG tablet Take 20 mg by mouth daily as needed for allergies ([place]).   Yes [provider]  clonazePAM (KLONOPIN) 0.5 MG tablet Take 1 tablet (0.5 mg total) by mouth 2 (two) times daily as needed for anxiety. 09/23/22  Yes Monica Becton, MD  enalapril (VASOTEC) 10 MG tablet Take 1  tablet (10 mg total) by mouth 2 (two) times daily. 01/30/23  Yes Monica Becton, MD  EPINEPHrine 0.3 mg/0.3 mL IJ SOAJ injection Inject 0.3 mg into the muscle as needed for anaphylaxis. 09/23/22  Yes Monica Becton, MD  escitalopram (LEXAPRO) 20 MG tablet Take 2 tablets (40 mg total) by mouth daily. 11/17/22  Yes Monica Becton, MD  ezetimibe (ZETIA) 10 MG tablet Take 1 tablet (10 mg total) by mouth daily. 01/24/23  Yes Weaver, Scott T, PA-C  gabapentin (NEURONTIN) 300 MG capsule TAKE 1 CAPSULE(300 MG) BY MOUTH THREE TIMES DAILY 11/02/22  Yes Monica Becton, MD  Hyoscyamine Sulfate SL (LEVSIN/SL) 0.125 MG SUBL Dissolve one tablet every 4-6 hours as needed for abdominal pain/spasms 03/18/22  Yes Esterwood, Amy S, PA-C  isosorbide mononitrate (IMDUR) 30 MG 24 hr tablet Take 0.5 tablets (15 mg total) by mouth daily. CAN TAKE AN ADDITIONAL HALF TABLET AS NEEDED FOR CHEST PAIN 10/12/22  Yes Meriam Sprague, MD  metFORMIN (GLUCOPHAGE-XR) 750 MG 24 hr tablet Take 1 tablet (750 mg total) by mouth daily with breakfast. 01/30/23  Yes Monica Becton, MD  nicotine (NICODERM CQ - DOSED IN MG/24 HOURS) 21 mg/24hr patch Place 1 patch (21 mg total) onto the skin daily. 01/31/22  Yes Meriam Sprague, MD  nitroGLYCERIN (NITROSTAT) 0.4 MG SL tablet Place 1 tablet (0.4 mg total) under the tongue every 5 (five) minutes as needed for chest pain. 12/30/21  Yes Gaston Islam., NP  rosuvastatin (CRESTOR) 20 MG tablet TAKE 1 TABLET(20 MG) BY MOUTH DAILY 09/27/22  Yes Monica Becton, MD  Semaglutide 14 MG TABS Take 1 tablet (14 mg total) by mouth daily. 05/17/22  Yes Monica Becton, MD    Family History Family History  Problem Relation Age of Onset   Depression Mother    Diabetes Father    Hyperlipidemia Father    Hypertension Father    Depression Sister    Breast cancer Maternal Grandmother    Crohn's disease Maternal Grandmother    High blood pressure  Paternal Grandfather    Diabetes Paternal Grandfather    Depression Son    Sleep apnea Neg Hx    Liver disease Neg Hx    Esophageal cancer Neg Hx    Stomach cancer Neg Hx     Social History Social History   Tobacco Use   Smoking status: Every Day    Current packs/day: 1.00    Types: Cigarettes   Smokeless tobacco: Never  Vaping Use   Vaping status: Never Used  Substance Use Topics   Alcohol use: Not Currently   Drug use: No     Allergies   Macrobid [nitrofurantoin], Other, Tetanus toxoids, Thimerosal (thiomersal), and Thimerosal   Review of Systems Review of Systems  Constitutional:  Positive for fever.  All other systems reviewed and are negative.    Physical Exam Triage Vital Signs ED Triage Vitals  Encounter Vitals Group     BP 02/06/23 0955 121/81     Systolic BP Percentile --      Diastolic BP Percentile --      Pulse Rate 02/06/23 0955 76     Resp 02/06/23 0955 18     Temp 02/06/23 0955 98.5 F (36.9 C)     Temp Source 02/06/23 0955 Oral     SpO2 02/06/23 0955 99 %     Weight 02/06/23 0956 192 lb (87.1 kg)     Height 02/06/23 0956 5\' 6"  (1.676 m)     Head Circumference --      Peak Flow --      Pain Score 02/06/23 0956 0     Pain Loc --      Pain Education --      Exclude from Growth Chart --    No data found.  Updated Vital Signs BP 121/81 (BP Location: Right Arm)   Pulse 76   Temp 98.5 F (36.9 C) (Oral)   Resp 18   Ht 5\' 6"  (1.676 m)   Wt 192 lb (87.1 kg)   LMP  (LMP Unknown)   SpO2 99%   BMI 30.99 kg/m    Physical Exam Vitals and nursing note reviewed.  Constitutional:      General: She is not in acute distress.    Appearance: Normal appearance. She is obese. She is not ill-appearing, toxic-appearing or diaphoretic.  HENT:     Head: Normocephalic and atraumatic.     Mouth/Throat:     Mouth: Mucous membranes are moist.     Pharynx: Oropharynx is clear.  Eyes:     Extraocular Movements: Extraocular movements intact.      Conjunctiva/sclera: Conjunctivae normal.     Pupils: Pupils are equal, round, and reactive to light.  Cardiovascular:     Rate and Rhythm: Normal rate and regular rhythm.     Pulses: Normal pulses.     Heart sounds: Normal heart sounds. No murmur heard.    No friction rub. No gallop.  Pulmonary:     Effort: Pulmonary effort is normal.     Breath sounds: Normal breath sounds. No wheezing, rhonchi or rales.  Musculoskeletal:        General: Normal range of motion.     Cervical back: Normal range of motion and neck supple.  Skin:    General: Skin is warm and dry.  Neurological:     General: No focal deficit present.     Mental Status: She is alert and oriented to person, place, and time. Mental status is at baseline.  Psychiatric:        Mood and Affect: Mood normal.        Behavior: Behavior normal.      UC Treatments / Results  Labs (all labs ordered are listed, but only abnormal results are displayed) Labs Reviewed  CBC WITH DIFFERENTIAL/PLATELET  COMPREHENSIVE METABOLIC PANEL  CBC WITH DIFFERENTIAL/PLATELET  COMPREHENSIVE METABOLIC PANEL    EKG   Radiology No results found.  Procedures Procedures (including critical care time)  Medications Ordered in UC Medications - No data to display  Initial Impression / Assessment and Plan / UC Course  I have reviewed the triage vital signs and the nursing notes.  Pertinent labs & imaging results that were available during my care of  the patient were reviewed by me and considered in my medical decision making (see chart for details).     MDM: 1.  Fever, unspecified-CBC with differential, CMP ordered statAdvised patient may take OTC Tylenol 1 g every 6 hours for fever (oral temperature greater than 100.3) encouraged to increase daily water intake to 64 ounces per day while taking this medication.  Advised we will follow-up with lab results once received.  Advised if symptoms worsen and/or unresolved please follow-up with PCP  or here for further evaluation.  Patient discharged home, hemodynamically stable. Final Clinical Impressions(s) / UC Diagnoses   Final diagnoses:  Fever, unspecified     Discharge Instructions      Advised patient may take OTC Tylenol 1 g every 6 hours for fever (oral temperature greater than 100.3) encouraged to increase daily water intake to 64 ounces per day while taking this medication.  Advised we will follow-up with lab results once received.  Advised if symptoms worsen and/or unresolved please follow-up with PCP or here for further evaluation.     ED Prescriptions   None    PDMP not reviewed this encounter.   Trevor Iha, FNP 02/06/23 1214

## 2023-02-06 NOTE — Discharge Instructions (Addendum)
Advised patient may take OTC Tylenol 1 g every 6 hours for fever (oral temperature greater than 100.3) encouraged to increase daily water intake to 64 ounces per day while taking this medication.  Advised we will follow-up with lab results once received.  Advised if symptoms worsen and/or unresolved please follow-up with PCP or here for further evaluation.

## 2023-02-06 NOTE — ED Triage Notes (Signed)
Patient c/o fever x 1 week, low grade to 101, feels like "hot flashes".  Denies any cold sx's.  Fatigue feeling.  Patient has taken Ibuprofen.

## 2023-02-08 ENCOUNTER — Encounter: Payer: Self-pay | Admitting: Family Medicine

## 2023-02-08 ENCOUNTER — Ambulatory Visit: Payer: 59

## 2023-02-08 ENCOUNTER — Ambulatory Visit (INDEPENDENT_AMBULATORY_CARE_PROVIDER_SITE_OTHER): Payer: 59 | Admitting: Family Medicine

## 2023-02-08 VITALS — BP 124/80 | HR 81 | Resp 12 | Ht 66.0 in | Wt 197.1 lb

## 2023-02-08 DIAGNOSIS — R509 Fever, unspecified: Secondary | ICD-10-CM | POA: Diagnosis not present

## 2023-02-08 DIAGNOSIS — D72821 Monocytosis (symptomatic): Secondary | ICD-10-CM | POA: Diagnosis not present

## 2023-02-08 DIAGNOSIS — R10826 Epigastric rebound abdominal tenderness: Secondary | ICD-10-CM | POA: Diagnosis not present

## 2023-02-08 NOTE — Progress Notes (Signed)
Acute Office Visit  Subjective:     Patient ID: Sarah Nicholson, female    DOB: [birth year], 52 y.o.   MRN: [id number]  Chief Complaint  Patient presents with   Fever    HPI Patient is in today for fever that started the days after injection in the left trochanteric bursa injection on the 11/18.  She then went to urgent care 2 days ago on November 25 and had a CBC showing an elevated white blood cell count of 16.4 and a hemoglobin of 15.  Absolute neutrophil count was elevated at 10.5.  She had had a UTI back in September and was treated with Levaquin.  She is just been having sensation of feeling hot, sweaty and flushed that can last for at least an hour To go outside to cool off when she checks her temperature she is running a low-grade fever.  She is not sleeping well.  She just feels really tired and exhausted and feels like that is getting a little worse.  No sore throat, no sinus pain.  No GI symptoms.  She had a little bit of a frontal headache yesterday.  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  No Rash.  She denies any redness or tenderness or swelling at the injection site or over the bursa itself.  She denies any wounds anywhere else on her body.  No dysuria frequency hematuria.  No cough or shortness of breath.  No change in bowels.  ROS      Objective:    BP 124/80   Pulse 81   Resp 12   Ht 5\' 6"  (1.676 m)   Wt 197 lb 1.3 oz (89.4 kg)   LMP  (LMP Unknown)   SpO2 97%   BMI 31.81 kg/m    Physical Exam Constitutional:      Appearance: Normal appearance.  HENT:     Head: Normocephalic and atraumatic.     Right Ear: Tympanic membrane, ear canal and external ear normal. There is no impacted cerumen.     Left Ear: Tympanic membrane, ear canal and external ear normal. There is no impacted cerumen.     Nose: Nose normal.     Mouth/Throat:     Pharynx: Oropharynx is clear.  Eyes:     Conjunctiva/sclera: Conjunctivae normal.   Cardiovascular:     Rate and Rhythm: Normal rate and regular rhythm.  Pulmonary:     Effort: Pulmonary effort is normal.     Breath sounds: Normal breath sounds.  Abdominal:     General: Bowel sounds are normal.     Palpations: Abdomen is soft.     Tenderness: There is abdominal tenderness.     Comments: Tenderness over the epigastric tenderness.    Musculoskeletal:     Cervical back: Neck supple. No tenderness.  Lymphadenopathy:     Cervical: No cervical adenopathy.  Skin:    General: Skin is warm and dry.  Neurological:     Mental Status: She is alert and oriented to person, place, and time.  Psychiatric:        Mood and Affect: Mood normal.     No results found for any visits on 02/08/23.      Assessment & Plan:   Problem List Items Addressed This Visit       Other   Leukocytosis   Relevant Orders   CBC with Differential/Platelet   CMP14+EGFR   Lipase  CMV abs, IgG+IgM (cytomegalovirus)   EBV ab to viral capsid ag pnl, IgG+IgM   Other Visit Diagnoses     Fever, unspecified fever cause    -  Primary   Relevant Orders   CBC with Differential/Platelet   CMP14+EGFR   Lipase   CMV abs, IgG+IgM (cytomegalovirus)   EBV ab to viral capsid ag pnl, IgG+IgM   Epigastric abdominal tenderness with rebound tenderness       Relevant Orders   CBC with Differential/Platelet   CMP14+EGFR   Lipase   CMV abs, IgG+IgM (cytomegalovirus)   EBV ab to viral capsid ag pnl, IgG+IgM      Fever with elevated white blood cells-unclear if viral versus bacterial.  Will repeat the CBC to see if the white blood cell count is climbing.  Right now she does not have any specific symptoms traceable to any specific organ system or wound or infection.  I do not think this is related at all to the injection that she received.  Will check for CMV and EBV.  Will call with results once available.  Did encourage her to give [place] a call if she develops any new or worsening symptoms.  Does also  have some epigastric tenderness.has had more gas.   No orders of the defined types were placed in this encounter.   Return if symptoms worsen or fail to improve.  Nani Gasser, MD

## 2023-02-10 LAB — CMP14+EGFR
ALT: 14 [IU]/L (ref 0–32)
AST: 11 [IU]/L (ref 0–40)
Albumin: 4 g/dL (ref 3.8–4.9)
Alkaline Phosphatase: 82 [IU]/L (ref 44–121)
BUN/Creatinine Ratio: 21 (ref 9–23)
BUN: 21 mg/dL (ref 6–24)
Bilirubin Total: <0.2 mg/dL (ref 0.0–1.2)
CO2: 25 mmol/L (ref 20–29)
Calcium: 9.6 mg/dL (ref 8.7–10.2)
Chloride: 104 mmol/L (ref 96–106)
Creatinine, Ser: 1.02 mg/dL — ABNORMAL HIGH (ref 0.57–1.00)
Globulin, Total: 2.6 g/dL (ref 1.5–4.5)
Glucose: 88 mg/dL (ref 70–99)
Potassium: 4.4 mmol/L (ref 3.5–5.2)
Sodium: 143 mmol/L (ref 134–144)
Total Protein: 6.6 g/dL (ref 6.0–8.5)
eGFR: 65 mL/min/{1.73_m2} (ref >59)

## 2023-02-10 LAB — CBC WITH DIFFERENTIAL/PLATELET
Basophils Absolute: 0.1 10*3/uL (ref 0.0–0.2)
Basos: 0 %
EOS (ABSOLUTE): 0.1 10*3/uL (ref 0.0–0.4)
Eos: 1 %
Hematocrit: 46.4 % (ref 34.0–46.6)
Hemoglobin: 14.7 g/dL (ref 11.1–15.9)
Immature Grans (Abs): 0.1 10*3/uL (ref 0.0–0.1)
Immature Granulocytes: 1 %
Lymphocytes Absolute: 4.7 10*3/uL — ABNORMAL HIGH (ref 0.7–3.1)
Lymphs: 34 %
MCH: 29.5 pg (ref 26.6–33.0)
MCHC: 31.7 g/dL (ref 31.5–35.7)
MCV: 93 fL (ref 79–97)
Monocytes Absolute: 1.1 10*3/uL — ABNORMAL HIGH (ref 0.1–0.9)
Monocytes: 8 %
Neutrophils Absolute: 8 10*3/uL — ABNORMAL HIGH (ref 1.4–7.0)
Neutrophils: 56 %
Platelets: 321 10*3/uL (ref 150–450)
RBC: 4.99 x10E6/uL (ref 3.77–5.28)
RDW: 13 % (ref 11.7–15.4)
WBC: 14 10*3/uL — ABNORMAL HIGH (ref 3.4–10.8)

## 2023-02-10 LAB — EBV AB TO VIRAL CAPSID AG PNL, IGG+IGM
EBV VCA IgG: >600 U/mL — ABNORMAL HIGH (ref 0.0–17.9)
EBV VCA IgM: <36 U/mL (ref 0.0–35.9)

## 2023-02-10 LAB — CMV ABS, IGG+IGM (CYTOMEGALOVIRUS)
CMV Ab - IgG: 2 U/mL — ABNORMAL HIGH (ref 0.00–0.59)
CMV IgM Ser EIA-aCnc: <30 [AU]/ml (ref 0.0–29.9)

## 2023-02-10 LAB — LIPASE: Lipase: 52 U/L (ref 14–72)

## 2023-02-12 NOTE — Progress Notes (Signed)
Hi Sarah Nicholson your White count is trending down which is great!! No pancreatitis. No mono which is great!! I hope you are feeling much better by now, If not then please let us know.  Thank you!

## 2023-02-17 ENCOUNTER — Encounter: Payer: Self-pay | Admitting: Sports Medicine

## 2023-02-17 ENCOUNTER — Ambulatory Visit (INDEPENDENT_AMBULATORY_CARE_PROVIDER_SITE_OTHER): Payer: 59 | Admitting: Sports Medicine

## 2023-02-17 DIAGNOSIS — D72825 Bandemia: Secondary | ICD-10-CM

## 2023-02-17 NOTE — Progress Notes (Signed)
    Procedures performed today:    None.  Independent interpretation of notes and tests performed by another provider:   None.  Brief History, Exam, Impression, and Recommendations:    Leukocytosis Very pleasant 52 year old female, I did a trochanteric bursa injection last month on the 18th, couple of days later she developed fevers. She went to urgent care, she was clinically stable, exam was normal, was doing a lot better, a CBC was obtained that showed leukocytosis. She saw my partner a few days later, WBC count was slightly improved and EBV and CMV titers were essentially negative for acute infection. She returns to see me today, symptoms have completely resolved, her hip is wonderful, I do suspect she just had a viral infection, and the timing was coincidental. The leukocytosis may have been from the viral infection but also may have been secondary to her steroid injection. As she is clinically completely resolved I do not think we need any additional workup or labs. We filled out FMLA paperwork today with out of work 02/01/2023 and return to work 02/20/2023 continuous leave.  I spent 30 minutes of total time managing this patient today, this includes chart review, face to face, and non-face to face time.  ____________________________________________ Ihor Austin. Benjamin Stain, M.D., ABFM., CAQSM., AME. Primary Care and Sports Medicine [place] [facility]  Adjunct Professor of Family Medicine  [place] [employer] [facility] [employer]  Restaurant manager, fast food

## 2023-02-17 NOTE — Assessment & Plan Note (Signed)
Very pleasant 52 year old female, I did a trochanteric bursa injection last month on the 18th, couple of days later she developed fevers. She went to urgent care, she was clinically stable, exam was normal, was doing a lot better, a CBC was obtained that showed leukocytosis. She saw my partner a few days later, WBC count was slightly improved and EBV and CMV titers were essentially negative for acute infection. She returns to see me today, symptoms have completely resolved, her hip is wonderful, I do suspect she just had a viral infection, and the timing was coincidental. The leukocytosis may have been from the viral infection but also may have been secondary to her steroid injection. As she is clinically completely resolved I do not think we need any additional workup or labs. We filled out FMLA paperwork today with out of work 02/01/2023 and return to work 02/20/2023 continuous leave.

## 2023-03-16 ENCOUNTER — Ambulatory Visit (INDEPENDENT_AMBULATORY_CARE_PROVIDER_SITE_OTHER): Payer: 59 | Admitting: Sports Medicine

## 2023-03-16 DIAGNOSIS — E119 Type 2 diabetes mellitus without complications: Secondary | ICD-10-CM | POA: Diagnosis not present

## 2023-03-16 DIAGNOSIS — D72825 Bandemia: Secondary | ICD-10-CM | POA: Diagnosis not present

## 2023-03-16 DIAGNOSIS — Z7984 Long term (current) use of oral hypoglycemic drugs: Secondary | ICD-10-CM

## 2023-03-16 MED ORDER — OZEMPIC (1 MG/DOSE) 4 MG/3ML ~~LOC~~ SOPN: 1.0000 mg | PEN_INJECTOR | SUBCUTANEOUS | 11 refills | Status: DC

## 2023-03-16 NOTE — Assessment & Plan Note (Signed)
 Pleasant 53 year old female returns, she had a trochanteric bursa injection on 18 November, she developed some fevers several days later, she was seen in urgent care, she was clinically stable, exam normal, she was doing a lot better from a hip standpoint but CBC showed leukocytosis. She saw my partner a few days later and WBC count was slightly improved, EBV and CMV titers were negative for acute infection. I saw her then on 6 December, symptoms had completely resolved, hip was doing great, we have suspected she simply had a viral infection and the timing was coincidental. We filled out additional FMLA and disability paperwork today, patient is back to full duty. Return to see me as needed.

## 2023-03-16 NOTE — Progress Notes (Signed)
    Procedures performed today:    None.  Independent interpretation of notes and tests performed by another provider:   None.  Brief History, Exam, Impression, and Recommendations:    Leukocytosis Pleasant 53 year old female returns, she had a trochanteric bursa injection on 18 November, she developed some fevers several days later, she was seen in urgent care, she was clinically stable, exam normal, she was doing a lot better from a hip standpoint but CBC showed leukocytosis. She saw my partner a few days later and WBC count was slightly improved, EBV and CMV titers were negative for acute infection. I saw her then on 6 December, symptoms had completely resolved, hip was doing great, we have suspected she simply had a viral infection and the timing was coincidental. We filled out additional FMLA and disability paperwork today, patient is back to full duty. Return to see me as needed.  Controlled type 2 diabetes mellitus without complication, without long-term current use of insulin (HCC) Under good control, patient is however unable to tolerate Rybelsus , we will switch to Ozempic . She went from Rybelsus  14, we will switch to Ozempic  1 mg, we can do this for about 3 months before repeating A1c to adjust the dose up or down.    ____________________________________________ Debby PARAS. Curtis, M.D., ABFM., CAQSM., AME. Primary Care and Sports Medicine [place] [facility]  Adjunct Professor of [facility] Medicine  [employer]  Restaurant Manager, [employer]

## 2023-03-16 NOTE — Assessment & Plan Note (Signed)
 Under good control, patient is however unable to tolerate Rybelsus, we will switch to Ozempic. She went from Rybelsus 14, we will switch to Ozempic 1 mg, we can do this for about 3 months before repeating A1c to adjust the dose up or down.

## 2023-03-20 ENCOUNTER — Encounter: Payer: Self-pay | Admitting: Obstetrics & Gynecology

## 2023-03-20 ENCOUNTER — Ambulatory Visit: Payer: 59 | Admitting: Obstetrics & Gynecology

## 2023-03-20 VITALS — BP 146/78 | HR 63 | Resp 16 | Ht 66.0 in | Wt 203.0 lb

## 2023-03-20 DIAGNOSIS — R159 Full incontinence of feces: Secondary | ICD-10-CM | POA: Diagnosis not present

## 2023-03-20 DIAGNOSIS — N941 Unspecified dyspareunia: Secondary | ICD-10-CM | POA: Diagnosis not present

## 2023-03-20 DIAGNOSIS — N951 Menopausal and female climacteric states: Secondary | ICD-10-CM

## 2023-03-20 DIAGNOSIS — R6882 Decreased libido: Secondary | ICD-10-CM | POA: Diagnosis not present

## 2023-03-20 NOTE — Progress Notes (Signed)
   Subjective:    Patient ID: Sarah Nicholson, female    DOB: May 13, 1970, 53 y.o.   MRN: [id number]  HPI  53 year old female presents for follow-up for night sweats, pelvic pain, fecal incontinence and decreased libido.  Patient started the 900 mg Neurontin  at night and this has helped her sleep.  She is satisfied the results at this point.  She has an appointment with physical therapy in February.  If this is not helpful with fecal incontinence we will refer her to urogynecology or general surgery.  Patient has not tried any vaginal stressors.  We reviewed Desert harvest product with aloe and lidocaine .  Review of Systems  Constitutional: Negative.   Respiratory: Negative.    Cardiovascular: Negative.   Gastrointestinal: Negative.        Fecal incontinence  Genitourinary:  Positive for vaginal pain. Negative for vaginal bleeding.       Objective:   Physical Exam Vitals reviewed.  Constitutional:      General: She is not in acute distress.    Appearance: She is well-developed.  HENT:     Head: Normocephalic and atraumatic.  Eyes:     Conjunctiva/sclera: Conjunctivae normal.  Cardiovascular:     Rate and Rhythm: Normal rate.  Pulmonary:     Effort: Pulmonary effort is normal.  Skin:    General: Skin is warm and dry.  Neurological:     Mental Status: She is alert and oriented to person, place, and time.  Psychiatric:        Mood and Affect: Mood normal.    Vitals:   03/20/23 1430  BP: (!) 146/78  Pulse: 63  Resp: 16  Weight: 203 lb (92.1 kg)  Height: 5' 6 (1.676 m)      Assessment & Plan:  53 year old female for follow-up as below.  Continue increased dose of Neurontin  to 900 mg at night Patient to try Desert harvest vaginal moisturizer Physical therapy to start in very.

## 2023-03-22 ENCOUNTER — Ambulatory Visit: Payer: 59

## 2023-03-22 DIAGNOSIS — Z01419 Encounter for gynecological examination (general) (routine) without abnormal findings: Secondary | ICD-10-CM

## 2023-03-22 DIAGNOSIS — Z1231 Encounter for screening mammogram for malignant neoplasm of breast: Secondary | ICD-10-CM

## 2023-04-14 ENCOUNTER — Encounter (INDEPENDENT_AMBULATORY_CARE_PROVIDER_SITE_OTHER): Payer: Self-pay | Admitting: Sports Medicine

## 2023-04-14 DIAGNOSIS — E119 Type 2 diabetes mellitus without complications: Secondary | ICD-10-CM

## 2023-04-14 DIAGNOSIS — Z7984 Long term (current) use of oral hypoglycemic drugs: Secondary | ICD-10-CM

## 2023-04-14 MED ORDER — ONDANSETRON 8 MG PO TBDP: 8.0000 mg | ORAL_TABLET | Freq: Three times a day (TID) | ORAL | 3 refills | Status: AC | PRN

## 2023-04-14 NOTE — Telephone Encounter (Signed)

## 2023-04-17 ENCOUNTER — Ambulatory Visit: Payer: 59 | Attending: Obstetrics & Gynecology | Admitting: Physical Therapy

## 2023-04-17 ENCOUNTER — Encounter: Payer: Self-pay | Admitting: Physical Therapy

## 2023-04-17 ENCOUNTER — Other Ambulatory Visit: Payer: Self-pay

## 2023-04-17 DIAGNOSIS — R279 Unspecified lack of coordination: Secondary | ICD-10-CM | POA: Diagnosis not present

## 2023-04-17 DIAGNOSIS — R293 Abnormal posture: Secondary | ICD-10-CM | POA: Diagnosis present

## 2023-04-17 DIAGNOSIS — M6281 Muscle weakness (generalized): Secondary | ICD-10-CM | POA: Diagnosis present

## 2023-04-17 DIAGNOSIS — R159 Full incontinence of feces: Secondary | ICD-10-CM | POA: Insufficient documentation

## 2023-04-17 NOTE — Patient Instructions (Signed)
Moisturizers They are used in the vagina to hydrate the mucous membrane that make up the vaginal canal. Designed to keep a more normal acid balance (ph) Once placed in the vagina, it will last between two to three days.  Use 2-3 times per week at bedtime  Ingredients to avoid is glycerin and fragrance, can increase chance of infection Should not be used just before sex due to causing irritation Most are gels administered either in a tampon-shaped applicator or as a vaginal suppository. They are non-hormonal.   Types of Moisturizers(internal use)  Vitamin E vaginal suppositories- Whole foods, Amazon Moist Again Coconut oil- can break down condoms, any grocery store (prefer organic) Julva- (Do no use if taking  Tamoxifen) amazon Yes moisturizer- amazon NeuEve Silk , NeuEve Silver for menopausal or over 65 (if have severe vaginal atrophy or cancer treatments use NeuEve Silk for  1 month than move to [employer])- [employer], [place].com Olive and Bee intimate cream- www.oliveandbee.com.au Mae vaginal moisturizer- Amazon Aloe Good Clean Love Hyaluronic acid Hyalofemme Reveree hyaluronic acid inserts   Creams to use externally on the Vulva area Marathon Oil (good for for cancer patients that had radiation to the area)- [place] or [employer].https://garcia-[email or web address]/ Vulva Balm/ V-magic cream by medicine mama- amazon Julva-amazon Vital "V Wild Yam salve ( help moisturize and help with thinning vulvar area, does have Beeswax MoodMaid Botanical Pro-Meno Wild Yam Cream- Amazon Desert Harvest Gele Cleo by Zane Herald labial moisturizer (Amazon),  Coconut or olive oil aloe Good Clean Love Enchanted Rose by intimate rose  Things to avoid in the vaginal area Do not use things to irritate the vulvar area No lotions just specialized creams for the vulva area- Neogyn, V-magic,  No soaps; can use Aveeno or Calendula cleanser, unscented Dove if needed. Must be gentle No deodorants No douches Good to  sleep without underwear to let the vaginal area to air out No scrubbing: spread the lips to let warm water rinse over labias and pat dry   Lubrication Used for intercourse to reduce friction Avoid ones that have glycerin, nonoxynol-9, petroleum, propylene glycol, chlorhexidine gluconate, warming gels, tingling gels, icing or cooling gel, scented Avoid parabens due to a preservative similar to female sex hormone May need to be reapplied once or several times during sexual activity Can be applied to both partners genitals prior to vaginal penetration to minimize friction or irritation Prevent irritation and mucosal tears that cause post coital pain and increased the risk of vaginal and urinary tract infections Oil-based lubricants cannot be used with condoms due to breaking them down.  Least likely to irritate vaginal tissue.  Plant based-lubes are safe Silicone-based lubrication are thicker and last long and used for post-menopausal women  Vaginal Lubricators Here is a list of some suggested lubricators you can use for intercourse. Use the most hypoallergenic product.  You can place on you or your partner.  Slippery Stuff ( water based) Sylk or Sliquid Natural H2O ( good  if frequent UTI's)- walmart, amazon Sliquid organics silk-(aloe and silicone based ) [employer] (www.blossom-organics.com)- (aloe based ) Coconut oil, olive oil -not good with condoms  PJur Woman Nude- (water based) amazon Uberlube- ( silicon) Amazon Aloe Vera- Sprouts has an organic one Yes lubricant- (water based and has plant oil based similar to silicone) Loews [employer] Platinum-Silicone, Target, [facility] Olive and Bee intimate cream-  www.oliveandbee.com.au Pink - International Paper Erosense Sync- walmart, amazon Coconu- coconu.com Desert Hall[employer] Company Good Clean Love lubricants  Things to avoid in lubricants  are glycerin, warming gels, tingling gels, icing or cooling  gels, and scented gels.  Also avoid  Vaseline. KY jelly,  and Astroglide contain chlorhexidine which kills good bacteria(lactobacilli)  Things to avoid in the vaginal area Do not use things to irritate the vulvar area No lotions- see below Soaps you  can use :Aveeno, Calendula, Good Clean Love cleanser if needed. Must be gentle No deodorants No douches Good to sleep without underwear to let the vaginal area to air out No scrubbing: spread the lips to let warm water rinse over labias and pat dry  Creams that can be used on the Vulva Area V [employer], walmart Vital V Wild Yam Salve Julva- [employer] Botanical Pro-Meno Wild Yam Cream Coconut oil, olive oil Cleo by [employer] labial moisturizer -Amazon,  Desert [place] Releveum ( lidocaine) or Desert [employer] Corporation Yes Moisturizer
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
## 2023-04-22 ENCOUNTER — Other Ambulatory Visit: Payer: Self-pay | Admitting: Physician Assistant

## 2023-04-24 ENCOUNTER — Ambulatory Visit: Payer: 59 | Admitting: Physical Therapy

## 2023-05-01 ENCOUNTER — Ambulatory Visit: Payer: 59 | Admitting: Physical Therapy

## 2023-05-01 DIAGNOSIS — M6281 Muscle weakness (generalized): Secondary | ICD-10-CM

## 2023-05-01 DIAGNOSIS — R293 Abnormal posture: Secondary | ICD-10-CM

## 2023-05-01 NOTE — Patient Instructions (Signed)
  Complete while lying down 1-2x/day (am/pm) for ~5 mins gentle downward scooping motions.
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
## 2023-05-10 ENCOUNTER — Ambulatory Visit: Payer: 59 | Admitting: Physical Therapy

## 2023-05-10 DIAGNOSIS — R293 Abnormal posture: Secondary | ICD-10-CM

## 2023-05-10 DIAGNOSIS — R279 Unspecified lack of coordination: Secondary | ICD-10-CM

## 2023-05-10 DIAGNOSIS — M6281 Muscle weakness (generalized): Secondary | ICD-10-CM | POA: Diagnosis not present
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
## 2023-05-10 NOTE — Patient Instructions (Signed)

## 2023-05-11 ENCOUNTER — Ambulatory Visit: Payer: No Typology Code available for payment source

## 2023-05-11 ENCOUNTER — Other Ambulatory Visit (INDEPENDENT_AMBULATORY_CARE_PROVIDER_SITE_OTHER): Payer: Self-pay

## 2023-05-11 ENCOUNTER — Ambulatory Visit (INDEPENDENT_AMBULATORY_CARE_PROVIDER_SITE_OTHER): Payer: No Typology Code available for payment source | Admitting: Sports Medicine

## 2023-05-11 ENCOUNTER — Encounter: Payer: Self-pay | Admitting: Sports Medicine

## 2023-05-11 DIAGNOSIS — M898X8 Other specified disorders of bone, other site: Secondary | ICD-10-CM | POA: Diagnosis not present

## 2023-05-11 DIAGNOSIS — M7062 Trochanteric bursitis, left hip: Secondary | ICD-10-CM

## 2023-05-11 DIAGNOSIS — M25562 Pain in left knee: Secondary | ICD-10-CM | POA: Insufficient documentation

## 2023-05-11 MED ORDER — TRIAMCINOLONE ACETONIDE 40 MG/ML IJ SUSP
40.0000 mg | Freq: Once | INTRAMUSCULAR | Status: AC
  Administered 2023-05-11: 40 mg via INTRAMUSCULAR

## 2023-05-11 NOTE — Addendum Note (Signed)
 Addended by: Samule Dry on: 05/11/2023 02:14 PM   Modules accepted: Orders

## 2023-05-11 NOTE — Progress Notes (Signed)
    Procedures performed today:    Procedure: Real-time Ultrasound Guided injection of the left hip joint Device: Samsung HS60  Verbal informed consent obtained.  Time-out conducted.  Noted no overlying erythema, induration, or other signs of local infection.  Skin prepped in a sterile fashion.  Local anesthesia: Topical Ethyl chloride.  With sterile technique and under real time ultrasound guidance: Arthritic joint noted, 1 cc Kenalog 40, 2 cc lidocaine, 2 cc bupivacaine injected easily Completed without difficulty  Advised to call if fevers/chills, erythema, induration, drainage, or persistent bleeding.  Images permanently stored and available for review in PACS.  Impression: Technically successful ultrasound guided injection.  Independent interpretation of notes and tests performed by another provider:   None.  Brief History, Exam, Impression, and Recommendations:    Trochanteric bursitis and hip joint arthritis, left Increasing pain left hip, last injection was in November, repeat injection today.  Pain of left patellofemoral joint Increasing pain left anterior knee worse with stairs. Normal exam, suspect patellofemoral syndrome. Adding formal PT, x-rays.    ____________________________________________ Ihor Austin. Benjamin Stain, M.D., ABFM., CAQSM., AME. Primary Care and Sports Medicine [facility]  Adjunct Professor of Family Medicine  [employer]  Restaurant manager, fast food

## 2023-05-11 NOTE — Assessment & Plan Note (Signed)
 Increasing pain left anterior knee worse with stairs. Normal exam, suspect patellofemoral syndrome. Adding formal PT, x-rays.

## 2023-05-11 NOTE — Assessment & Plan Note (Signed)
 Increasing pain left hip, last injection was in November, repeat injection today.

## 2023-05-16 ENCOUNTER — Ambulatory Visit: Payer: Self-pay | Attending: Obstetrics & Gynecology | Admitting: Physical Therapy

## 2023-05-16 DIAGNOSIS — R293 Abnormal posture: Secondary | ICD-10-CM | POA: Diagnosis present

## 2023-05-16 DIAGNOSIS — M6281 Muscle weakness (generalized): Secondary | ICD-10-CM | POA: Diagnosis present

## 2023-05-16 DIAGNOSIS — R29898 Other symptoms and signs involving the musculoskeletal system: Secondary | ICD-10-CM | POA: Insufficient documentation

## 2023-05-16 DIAGNOSIS — M25562 Pain in left knee: Secondary | ICD-10-CM | POA: Diagnosis present

## 2023-05-16 DIAGNOSIS — M222X2 Patellofemoral disorders, left knee: Secondary | ICD-10-CM | POA: Diagnosis present
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
## 2023-05-22 ENCOUNTER — Other Ambulatory Visit: Payer: Self-pay | Admitting: Physician Assistant

## 2023-05-24 ENCOUNTER — Encounter: Payer: Self-pay | Admitting: Physical Therapy

## 2023-05-25 ENCOUNTER — Telehealth: Payer: Self-pay

## 2023-05-25 MED ORDER — ENALAPRIL MALEATE 10 MG PO TABS: 10.0000 mg | ORAL_TABLET | Freq: Two times a day (BID) | ORAL | 1 refills | Status: DC

## 2023-05-31 ENCOUNTER — Ambulatory Visit: Payer: No Typology Code available for payment source | Admitting: Physical Therapy

## 2023-06-04 NOTE — Therapy (Unsigned)
 OUTPATIENT PHYSICAL THERAPY LOWER EXTREMITY EVALUATION   Patient Name: Sarah Nicholson MRN: [id number] DOB:Jan 10, 1971, 53 y.o., female Today's Date: 06/05/2023  END OF SESSION:  PT End of Session - 06/05/23 1551     Visit Number 1    Number of Visits 8    Date for PT Re-Evaluation 07/31/23    Authorization Type UHC    Activity Tolerance Patient tolerated treatment well             Past Medical History:  Diagnosis Date   ADD (attention deficit disorder) 06/14/2012   Anxiety    Bronchitis    Complication of anesthesia 1987   states woke up during appedentomy   Depression 06/14/2012   Hepatic steatosis 11/13/2018   Hepatic steatosis with hepatomegaly seen on MRI abd on 11/2018   History of leukocytosis 06/14/2012   Reports "WBC 32" decades ago with negative hematology specialist workup.    Hyperlipidemia 06/14/2012   Hypertension    Seasonal allergies 06/14/2012   Past Surgical History:  Procedure Laterality Date   APPENDECTOMY     BICEPT TENODESIS Right 10/10/2019   Procedure: BICEPS TENODESIS;  Surgeon: Bjorn Pippin, MD;  [facility]  [facility];  Service: Orthopedics;  Laterality: Right;   bladder sling/mesh     INGUINAL HERNIA REPAIR     intrauterine ablation     LEFT HEART CATH AND CORONARY ANGIOGRAPHY N/A 12/09/2021   Procedure: LEFT HEART CATH AND CORONARY ANGIOGRAPHY;  Surgeon: Lyn Records, MD;  Location: [facility];  Service: Cardiovascular;  Laterality: N/A;   NASAL SINUS SURGERY     TUBAL LIGATION     VISCERAL ANGIOGRAPHY N/A 02/04/2022   Procedure: VISCERAL ANGIOGRAPHY/Mesentric;  Surgeon: Cephus Shelling, MD;  Location: [facility];  Service: Cardiovascular;  Laterality: N/A;   Patient Active Problem List   Diagnosis Date Noted   Pain of left patellofemoral joint 05/11/2023   Pain in left buttock 09/05/2022   Benign essential hypertension 08/18/2022   Hx of adenomatous polyp of colon 07/09/2022   Skin lesion  left lower lip 05/17/2022   Controlled type 2 diabetes mellitus without complication, without long-term current use of insulin (HCC) 02/15/2022   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   Abnormal stress test 12/09/2021   Hemiparesis, left (HCC) 11/03/2021   Herpes zoster 08/06/2021   Femoral hernia 03/04/2021   Leukocytosis 01/16/2021   Left lateral abdominal pain 01/15/2021   Lumbar spondylosis 01/15/2021   Trochanteric bursitis and hip joint arthritis, left 12/04/2020   Hepatic steatosis 11/13/2018   Tobacco use 07/18/2018   Irritable bowel syndrome (IBS) 05/04/2017   Hallux valgus 12/09/2014   Angina pectoris (HCC) 07/31/2014   Other and unspecified ovarian cyst 06/25/2013   Left tennis elbow 06/25/2013   Microscopic hematuria 06/27/2012   Hyperlipidemia 06/14/2012   Seasonal allergies 06/14/2012   Depression 06/14/2012   ADD (attention deficit disorder) 06/14/2012    PCP: Dr Rodney Langton  REFERRING PROVIDER: Dr Rodney Langton  REFERRING DIAG: L patellar femoral pain    THERAPY DIAG:  Patellofemoral pain syndrome of left knee  Other symptoms and signs involving the musculoskeletal system  Rationale for Evaluation and Treatment: Rehabilitation  ONSET DATE: 04/29/23  SUBJECTIVE:   SUBJECTIVE STATEMENT: Patient reports on and off pain in the L knee over the past year with no known injury. Pain typically last for 2 days to 2 weeks and resolves on its own. Most  recent flare up was about 1 month ago and she is not having pain currently. She would like to do some exercises to strengthen the knee and prevent flare ups of pain.   PERTINENT HISTORY: Currently being treated by pelvic PT for fecal incontinence; history of R shoulder scope 10/10/19 with PT 8/21 to 12/21  debridement; biceps tenodesis; SAD; DCR; LBP; recurrent trochanteric bursitis ~ 2 years - receives injection about every 3-4 months    PAIN:   Are you having pain? Yes: NPRS scale: 0/10 Pain location: under the knee cap (distal quad attachment) Pain description: sharp at times; constant ache when flared up Aggravating factors: stairs up > down; getting in and out a chair  Relieving factors: RICE   PRECAUTIONS: None  RED FLAGS: None   WEIGHT BEARING RESTRICTIONS: No  FALLS:  Has patient fallen in last 6 months? No  LIVING ENVIRONMENT: Lives with:lives with their spouse Lives in: House/apartment Stairs: No Has following equipment at home: None  OCCUPATION: Environmental health practitioner x 14 yrs  - variety of movement including up and down ladder; squatting; standing; walking; lifting Household chores; walking 1-2 x/month ~ 1 hour; yard work   PLOF: Independent  PATIENT GOALS: learn exercises to strengthening knees   NEXT MD VISIT: 06/22/23  OBJECTIVE:  Note: Objective measures were completed at Evaluation unless otherwise noted.  DIAGNOSTIC FINDINGS: xray 3/17//25: Left knee: The alignment and joint spaces are normal. No significant osteophytes. No erosion or focal bone abnormality.   PATIENT SURVEYS:  LEFS 56/80; 70%   COGNITION: Overall cognitive status: Within functional limits for tasks assessed     SENSATION: WFL  EDEMA:  none  MUSCLE LENGTH: Hamstrings: Right 80 deg; Left 90 deg ITB tight R  Quads tight R > L  Tight IR L hip   POSTURE: rounded shoulders, forward head, and LE's in ER   PALPATION: Tight lateral quad distally at insertion to patella   LOWER EXTREMITY ROM: WNL's throughout   Active ROM Right eval Left eval  Hip flexion    Hip extension    Hip abduction    Hip adduction    Hip internal rotation    Hip external rotation    Knee flexion    Knee extension    Ankle dorsiflexion    Ankle plantarflexion    Ankle inversion    Ankle eversion     (Blank rows = not tested)  LOWER EXTREMITY MMT: 5/5 bilat LE's   MMT Right eval Left eval  Hip flexion    Hip extension     Hip abduction    Hip adduction    Hip internal rotation    Hip external rotation    Knee flexion    Knee extension    Ankle dorsiflexion    Ankle plantarflexion    Ankle inversion    Ankle eversion     (Blank rows = not tested)  LOWER EXTREMITY SPECIAL TESTS:  Patella grind negative Poor patellar tracking with patella moving laterally    FUNCTIONAL TESTS:  5 times sit to stand: 10.06 sec   GAIT: Distance walked: 40 feet Assistive device utilized: None Level of assistance: Complete Independence Comments: WFL's some ER bilat hips  TREATMENT DATE:  Education re knee pain and patellofemoral pain specifically  Postural correction - will work to avoid standing with knees hyperextended Manual work - deep tissue work 5-7 min lateral quad toward lateral patella Exercise per flow sheet    PATIENT EDUCATION:  Education details: POC; HEP  Person educated: Patient Education method: Programmer, multimedia, Facilities manager, Actor cues, Verbal cues, and Handouts Education comprehension: verbalized understanding, returned demonstration, verbal cues required, tactile cues required, and needs further education  HOME EXERCISE PROGRAM: Access Code: [email or web address] URL: [email or web address] Date: 06/05/2023 Prepared by: Corlis Leak  Exercises - Prone Quadriceps Stretch with Strap  - 2 x daily - 7 x weekly - 1 sets - 3 reps - 30 sec  hold - Hooklying Hamstring Stretch with Strap  - 2 x daily - 7 x weekly - 1 sets - 3 reps - 30 sec  hold - Supine Piriformis Stretch with Leg Straight  - 1 x daily - 7 x weekly - 1 sets - 3 reps - 30 sec  hold - Supine Quad Set  - 1 x daily - 7 x weekly - 1 sets - 10 reps - 3 sec  hold - Straight Leg Raise with External Rotation  - 1 x daily - 7 x weekly - 1 sets - 10 reps - 3-5 sec  hold - Sit to Stand with Ball Between Knees  - 1 x  daily - 7 x weekly - 1 sets - 3 reps - 30 sec  hold - Wall Quarter Squat  - 1 x daily - 7 x weekly - 1-2 sets - 10 reps - 5-10 sec  hold - Mini Squat  - 1 x daily - 7 x weekly - 1 sets - 5-10 reps - 5-10 sec  hold - Single Leg Stance with Support  - 1 x daily - 7 x weekly - 1 sets - 3-5 reps - 20-30 sec  hold - Seated Hamstring Stretch  - 2 x daily - 7 x weekly - 1 sets - 3 reps - 30 sec  hold  ASSESSMENT:  CLINICAL IMPRESSION: Patient is a 53 y.o. female who was seen today for physical therapy evaluation and treatment for recurrent L knee pain consistent with patellofemoral syndrome. Patient is currently asymptomatic but reports flare up of L knee pain over the past year with pain lasting 2 days to 2 weeks and resolving without treatment. Patient has a history of L hip trochanteric bursitis for the past 2 years foe which she receives injections every 3-4 months. Knee pain related to hip pain. Patient stands with hyperextension of L knee. She has poor tracking of patella along the femoral condyles and functional weakness with LE abduction with squatting. Patient has tenderness and tightness to palpation along the lateral distal quad mid thigh to patella. She will benefit from PT to address problems identified.   OBJECTIVE IMPAIRMENTS: decreased mobility, decreased ROM, decreased strength, and pain.   ACTIVITY LIMITATIONS: sitting, standing, squatting, and locomotion level  PARTICIPATION LIMITATIONS: occupation and yard work  PERSONAL FACTORS: Fitness, Past/current experiences, Profession, Time since onset of injury/illness/exacerbation, and  comorbidities: L hip pain and dysfunction; recurrent nature of hip and knee pain; occupation requiring standing, climbing, variety of physically demanding LE tasks  are also affecting patient's functional outcome.   REHAB POTENTIAL: Good  CLINICAL DECISION MAKING: Evolving/moderate complexity  EVALUATION COMPLEXITY: Moderate   GOALS: Goals reviewed  with patient? Yes  SHORT TERM GOALS: Target date: 07/03/2023   Independent in initial HEP  Baseline: Goal status: INITIAL  2.  Patient demonstrates improved standing posture without hyperextension on knees  Baseline:  Goal status: INITIAL   LONG TERM GOALS: Target date: 07/31/2023   No incidents of L knee pain  Baseline:  Goal status: INITIAL  2.  Improve tracking of L patella with decreased palpable tightness through lateral quad  Baseline:  Goal status: INITIAL  3.  Improve squat mechanics without abduction and ER of hips  Baseline:  Goal status: INITIAL  4.  Independent in HEP  Baseline:  Goal status: INITIAL  5.  Improve LEFS score by 5-10 points  Baseline: 56/80; 70% Goal status: INITIAL   PLAN:  PT FREQUENCY: 2x/week  PT DURATION: 8 weeks  PLANNED INTERVENTIONS: 97110-Therapeutic exercises, 97530- Therapeutic activity, 97112- Neuromuscular re-education, [phone]- Self Care, 60454- Manual therapy, [phone]- Gait training, [phone]- Aquatic Therapy, [phone]- Electrical stimulation (unattended), [phone]- Ultrasound, Balance training, Stair training, Taping, Dry Needling, and Joint mobilization  PLAN FOR NEXT SESSION: review and progress exercises; posture and positional education; manual work and modalities as indicated   [employer], PT 06/05/2023, 4:02 PM

## 2023-06-05 ENCOUNTER — Ambulatory Visit: Admitting: Rehabilitative and Restorative Service Providers"

## 2023-06-05 ENCOUNTER — Other Ambulatory Visit: Payer: Self-pay

## 2023-06-05 DIAGNOSIS — R29898 Other symptoms and signs involving the musculoskeletal system: Secondary | ICD-10-CM

## 2023-06-05 DIAGNOSIS — R293 Abnormal posture: Secondary | ICD-10-CM | POA: Diagnosis not present

## 2023-06-05 DIAGNOSIS — M222X2 Patellofemoral disorders, left knee: Secondary | ICD-10-CM

## 2023-06-07 ENCOUNTER — Ambulatory Visit: Payer: 59 | Admitting: Physical Therapy

## 2023-06-14 ENCOUNTER — Ambulatory Visit: Payer: 59 | Attending: Obstetrics & Gynecology | Admitting: Physical Therapy

## 2023-06-14 DIAGNOSIS — R279 Unspecified lack of coordination: Secondary | ICD-10-CM | POA: Insufficient documentation

## 2023-06-14 DIAGNOSIS — R293 Abnormal posture: Secondary | ICD-10-CM | POA: Insufficient documentation

## 2023-06-14 DIAGNOSIS — M6281 Muscle weakness (generalized): Secondary | ICD-10-CM | POA: Insufficient documentation

## 2023-06-14 DIAGNOSIS — R29898 Other symptoms and signs involving the musculoskeletal system: Secondary | ICD-10-CM | POA: Insufficient documentation

## 2023-06-14 DIAGNOSIS — M222X2 Patellofemoral disorders, left knee: Secondary | ICD-10-CM | POA: Insufficient documentation

## 2023-06-19 ENCOUNTER — Encounter: Payer: Self-pay | Admitting: Rehabilitative and Restorative Service Providers"

## 2023-06-19 ENCOUNTER — Ambulatory Visit: Admitting: Rehabilitative and Restorative Service Providers"

## 2023-06-19 DIAGNOSIS — R293 Abnormal posture: Secondary | ICD-10-CM | POA: Diagnosis present

## 2023-06-19 DIAGNOSIS — M222X2 Patellofemoral disorders, left knee: Secondary | ICD-10-CM | POA: Diagnosis present

## 2023-06-19 DIAGNOSIS — R279 Unspecified lack of coordination: Secondary | ICD-10-CM | POA: Diagnosis present

## 2023-06-19 DIAGNOSIS — R29898 Other symptoms and signs involving the musculoskeletal system: Secondary | ICD-10-CM | POA: Diagnosis present

## 2023-06-19 DIAGNOSIS — M6281 Muscle weakness (generalized): Secondary | ICD-10-CM | POA: Diagnosis present

## 2023-06-19 NOTE — Therapy (Signed)
 OUTPATIENT PHYSICAL THERAPY LOWER EXTREMITY TREATMENT   Patient Name: Sarah Nicholson MRN: [id number] DOB:May 29, 1970, 53 y.o., female Today's Date: 06/19/2023  END OF SESSION:  PT End of Session - 06/19/23 1531     Visit Number 2    Number of Visits 8    Date for PT Re-Evaluation 07/31/23    Authorization Type UHC    PT Start Time 1530    PT Stop Time 1615    PT Time Calculation (min) 45 min    Activity Tolerance Patient tolerated treatment well             Past Medical History:  Diagnosis Date   ADD (attention deficit disorder) 06/14/2012   Anxiety    Bronchitis    Complication of anesthesia 1987   states woke up during appedentomy   Depression 06/14/2012   Hepatic steatosis 11/13/2018   Hepatic steatosis with hepatomegaly seen on MRI abd on 11/2018   History of leukocytosis 06/14/2012   Reports "WBC 32" decades ago with negative hematology specialist workup.    Hyperlipidemia 06/14/2012   Hypertension    Seasonal allergies 06/14/2012   Past Surgical History:  Procedure Laterality Date   APPENDECTOMY     BICEPT TENODESIS Right 10/10/2019   Procedure: BICEPS TENODESIS;  Surgeon: Bjorn Pippin, MD;  Location: [facility];  Service: Orthopedics;  Laterality: Right;   bladder sling/mesh     INGUINAL HERNIA REPAIR     intrauterine ablation     LEFT HEART CATH AND CORONARY ANGIOGRAPHY N/A 12/09/2021   Procedure: LEFT HEART CATH AND CORONARY ANGIOGRAPHY;  Surgeon: Lyn Records, MD;  Location: [facility];  Service: Cardiovascular;  Laterality: N/A;   NASAL SINUS SURGERY     TUBAL LIGATION     VISCERAL ANGIOGRAPHY N/A 02/04/2022   Procedure: VISCERAL ANGIOGRAPHY/Mesentric;  Surgeon: Cephus Shelling, MD;  Location: [facility];  Service: Cardiovascular;  Laterality: N/A;   Patient Active Problem List   Diagnosis Date Noted   Pain of left patellofemoral joint 05/11/2023   Pain in left buttock 09/05/2022   Benign essential  hypertension 08/18/2022   Hx of adenomatous polyp of colon 07/09/2022   Skin lesion left lower lip 05/17/2022   Controlled type 2 diabetes mellitus without complication, without long-term current use of insulin (HCC) 02/15/2022   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   Abnormal stress test 12/09/2021   Hemiparesis, left (HCC) 11/03/2021   Herpes zoster 08/06/2021   Femoral hernia 03/04/2021   Leukocytosis 01/16/2021   Left lateral abdominal pain 01/15/2021   Lumbar spondylosis 01/15/2021   Trochanteric bursitis and hip joint arthritis, left 12/04/2020   Hepatic steatosis 11/13/2018   Tobacco use 07/18/2018   Irritable bowel syndrome (IBS) 05/04/2017   Hallux valgus 12/09/2014   Angina pectoris (HCC) 07/31/2014   Other and unspecified ovarian cyst 06/25/2013   Left tennis elbow 06/25/2013   Microscopic hematuria 06/27/2012   Hyperlipidemia 06/14/2012   Seasonal allergies 06/14/2012   Depression 06/14/2012   ADD (attention deficit disorder) 06/14/2012    PCP: Dr Rodney Langton  REFERRING PROVIDER: Dr Rodney Langton  REFERRING DIAG: L patellar femoral pain    THERAPY DIAG:  Patellofemoral pain syndrome of left knee  Other symptoms and signs involving the musculoskeletal system  Abnormal posture  Muscle weakness (generalized)  Rationale for Evaluation and Treatment: Rehabilitation  ONSET DATE: 04/29/23  SUBJECTIVE:   SUBJECTIVE STATEMENT: Patient reports that  she not having knee pain. Trying to work on her exercises 3-4 times/wk   Eval: Patient reports on and off pain in the L knee over the past year with no known injury. Pain typically last for 2 days to 2 weeks and resolves on its own. Most recent flare up was about 1 month ago and she is not having pain currently. She would like to do some exercises to strengthen the knee and prevent flare ups of pain.   PERTINENT HISTORY: Currently being treated  by pelvic PT for fecal incontinence; history of R shoulder scope 10/10/19 with PT 8/21 to 12/21  debridement; biceps tenodesis; SAD; DCR; LBP; recurrent trochanteric bursitis ~ 2 years - receives injection about every 3-4 months    PAIN:  Are you having pain? Yes: NPRS scale: 0/10 Pain location: under the knee cap (distal quad attachment) Pain description: sharp at times; constant ache when flared up Aggravating factors: stairs up > down; getting in and out a chair  Relieving factors: RICE   PRECAUTIONS: None   WEIGHT BEARING RESTRICTIONS: No  FALLS:  Has patient fallen in last 6 months? No  LIVING ENVIRONMENT: Lives with:lives with their spouse Lives in: House/apartment Stairs: No Has following equipment at home: None  OCCUPATION: Environmental health practitioner x 14 yrs  - variety of movement including up and down ladder; squatting; standing; walking; lifting Household chores; walking 1-2 x/month ~ 1 hour; yard work   PATIENT GOALS: learn exercises to Counselling psychologist   NEXT MD VISIT: 06/22/23  OBJECTIVE:  Note: Objective measures were completed at Evaluation unless otherwise noted.  DIAGNOSTIC FINDINGS: xray 3/17//25: Left knee: The alignment and joint spaces are normal. No significant osteophytes. No erosion or focal bone abnormality.   PATIENT SURVEYS:  LEFS 56/80; 70%    SENSATION: WFL  EDEMA:  none  MUSCLE LENGTH: Hamstrings: Right 80 deg; Left 90 deg ITB tight R  Quads tight R > L  Tight IR L hip   POSTURE: rounded shoulders, forward head, and LE's in ER   PALPATION: Tight lateral quad distally at insertion to patella   LOWER EXTREMITY ROM: WNL's throughout   Active ROM Right eval Left eval  Hip flexion    Hip extension    Hip abduction    Hip adduction    Hip internal rotation    Hip external rotation    Knee flexion    Knee extension    Ankle dorsiflexion    Ankle plantarflexion    Ankle inversion    Ankle eversion     (Blank rows = not  tested)  LOWER EXTREMITY MMT: 5/5 bilat LE's   MMT Right eval Left eval  Hip flexion    Hip extension    Hip abduction    Hip adduction    Hip internal rotation    Hip external rotation    Knee flexion    Knee extension    Ankle dorsiflexion    Ankle plantarflexion    Ankle inversion    Ankle eversion     (Blank rows = not tested)  LOWER EXTREMITY SPECIAL TESTS:  Patella grind negative Poor patellar tracking with patella moving laterally    FUNCTIONAL TESTS:  5 times sit to stand: 10.06 sec   GAIT: Distance walked: 40 feet Assistive device utilized: None Level of assistance: Complete Independence Comments: WFL's some ER bilat hips  [facility] Adult PT Treatment:                                                DATE: 06/19/23 Therapeutic Exercise: Recumbent bike L 2 x 5 min  Supine Hamstring stretch 30 sec x 2 Piriformis stretch 30 sec x 2  Quad set x 10  SLR in ER x 10  Prone Quad stretch 30 sec x 2  Sitting Hamstring stretch 30 sec x 2 Sit to stand w/ ball btn knees - pain from lower surface - moved to wall quat  Standing Small range wall squat with ball btn knees 10 sec x 10 pain free range   Neuromuscular re-ed: Working on posture and alignment in standing with equal wt bearing LE's  Therapeutic Activity: Supine  Bridge with ball btn knees  Standing Step up 4 inch step 10 x 2 sets  Lateral heel tap 4 inch step x 10 x 2 sets  Single leg stance 10 sec x 3 L/R  Diver 10 x 2 sets L/R     TREATMENT DATE:  Education re knee pain and patellofemoral pain specifically  Postural correction - will work to avoid standing with knees hyperextended Manual work - deep tissue work 5-7 min lateral quad toward lateral patella Exercise per flow sheet    PATIENT EDUCATION:  Education details: POC; HEP  Person educated: Patient Education method:  Programmer, multimedia, Facilities manager, Actor cues, Verbal cues, and Handouts Education comprehension: verbalized understanding, returned demonstration, verbal cues required, tactile cues required, and needs further education  HOME EXERCISE PROGRAM:  Access Code: [email or web address] URL: [email or web address] Date: 06/19/2023 Prepared by: Corlis Leak  Exercises - Prone Quadriceps Stretch with Strap  - 2 x daily - 7 x weekly - 1 sets - 3 reps - 30 sec  hold - Hooklying Hamstring Stretch with Strap  - 2 x daily - 7 x weekly - 1 sets - 3 reps - 30 sec  hold - Supine Piriformis Stretch with Leg Straight  - 1 x daily - 7 x weekly - 1 sets - 3 reps - 30 sec  hold - Supine Quad Set  - 1 x daily - 7 x weekly - 1 sets - 10 reps - 3 sec  hold - Straight Leg Raise with External Rotation  - 1 x daily - 7 x weekly - 1 sets - 10 reps - 3-5 sec  hold - Sit to Stand with Ball Between Knees  - 1 x daily - 7 x weekly - 1 sets - 3 reps - 30 sec  hold - Wall Quarter Squat  - 1 x daily - 7 x weekly - 1-2 sets - 10 reps - 5-10 sec  hold - Mini Squat  - 1 x daily - 7 x weekly - 1 sets - 5-10 reps - 5-10 sec  hold - Single Leg Stance with Support  - 1 x daily - 7 x weekly - 1 sets - 3-5 reps - 20-30 sec  hold - Seated Hamstring Stretch  - 2 x daily - 7 x weekly - 1 sets - 3 reps - 30 sec  hold - Supine Bridge  - 2 x daily - 7 x weekly - 1-2 sets - 10 reps - 5-10 sec  hold - Wall Squat with Ball between Knees  - 2 x daily - 7 x weekly -  1-2 sets - 10 reps - 10 sec  hold - Step Up  - 2 x daily - 7 x weekly - 1 sets - 10 reps - 2 sec  hold - Lateral Step Up  - 2 x daily - 7 x weekly - 2 sets - 10 reps - 2 sec  hold - The Diver  - 2 x daily - 7 x weekly - 1 sets - 10 reps - 2-3 sec  hold  ASSESSMENT:  CLINICAL IMPRESSION: Patient returns with report of no knee pain. She has been compliant with HEP 3-4 times/week. Reviewed and progressed with exercises for strengthening L LE. She did have some pain with sit to stand from  lower surface with ball btn knees as well as wall squat lower. Patient continues to have weakness through L > R LE and will benefit from additional physical therapy to progress strengthening exercises for home program.  Consider clam; reverse clam; lunge; step down; curtsy squat; lunge back foot on step to progress resistive exercises.   [name]  EVAL: Patient is a 53 y.o. female who was seen today for physical therapy evaluation and treatment for recurrent L knee pain consistent with patellofemoral syndrome. Patient is currently asymptomatic but reports flare up of L knee pain over the past year with pain lasting 2 days to 2 weeks and resolving without treatment. Patient has a history of L hip trochanteric bursitis for the past 2 years foe which she receives injections every 3-4 months. Knee pain related to hip pain. Patient stands with hyperextension of L knee. She has poor tracking of patella along the femoral condyles and functional weakness with LE abduction with squatting. Patient has tenderness and tightness to palpation along the lateral distal quad mid thigh to patella. She will benefit from PT to address problems identified.   OBJECTIVE IMPAIRMENTS: decreased mobility, decreased ROM, decreased strength, and pain.    GOALS: Goals reviewed with patient? Yes  SHORT TERM GOALS: Target date: 07/03/2023   Independent in initial HEP  Baseline: Goal status: INITIAL  2.  Patient demonstrates improved standing posture without hyperextension on knees  Baseline:  Goal status: INITIAL   LONG TERM GOALS: Target date: 07/31/2023   No incidents of L knee pain  Baseline:  Goal status: INITIAL  2.  Improve tracking of L patella with decreased palpable tightness through lateral quad  Baseline:  Goal status: INITIAL  3.  Improve squat mechanics without abduction and ER of hips  Baseline:  Goal status: INITIAL  4.  Independent in HEP  Baseline:  Goal status: INITIAL  5.  Improve LEFS score by  5-10 points  Baseline: 56/80; 70% Goal status: INITIAL   PLAN:  PT FREQUENCY: 2x/week  PT DURATION: 8 weeks  PLANNED INTERVENTIONS: 97110-Therapeutic exercises, 97530- Therapeutic activity, 97112- Neuromuscular re-education, [phone]- Self Care, 60454- Manual therapy, [phone]- Gait training, [phone]- Aquatic Therapy, [phone]- Electrical stimulation (unattended), [phone]- Ultrasound, Balance training, Stair training, Taping, Dry Needling, and Joint mobilization  PLAN FOR NEXT SESSION: review and progress exercises; posture and positional education; manual work and modalities as indicated   [employer], PT 06/19/2023, 3:31 PM

## 2023-06-21 ENCOUNTER — Ambulatory Visit: Payer: 59 | Admitting: Physical Therapy

## 2023-06-21 ENCOUNTER — Telehealth: Payer: Self-pay | Admitting: Physical Therapy

## 2023-06-21 NOTE — Telephone Encounter (Signed)
 PT called pt about this afternoon's appointment at 1400. Pt did not answer, mailbox full unable to leave message.   Otelia Sergeant, PT, DPT 04/09/252:37 PM

## 2023-06-22 ENCOUNTER — Ambulatory Visit (INDEPENDENT_AMBULATORY_CARE_PROVIDER_SITE_OTHER): Payer: No Typology Code available for payment source | Admitting: Sports Medicine

## 2023-06-22 ENCOUNTER — Encounter: Payer: Self-pay | Admitting: Sports Medicine

## 2023-06-22 VITALS — BP 102/67 | HR 77 | Resp 20 | Ht 66.0 in | Wt 190.2 lb

## 2023-06-22 DIAGNOSIS — E119 Type 2 diabetes mellitus without complications: Secondary | ICD-10-CM | POA: Diagnosis not present

## 2023-06-22 DIAGNOSIS — M7062 Trochanteric bursitis, left hip: Secondary | ICD-10-CM

## 2023-06-22 DIAGNOSIS — M25562 Pain in left knee: Secondary | ICD-10-CM | POA: Diagnosis not present

## 2023-06-22 DIAGNOSIS — E782 Mixed hyperlipidemia: Secondary | ICD-10-CM

## 2023-06-22 DIAGNOSIS — Z7985 Long-term (current) use of injectable non-insulin antidiabetic drugs: Secondary | ICD-10-CM

## 2023-06-22 NOTE — Assessment & Plan Note (Signed)
 Left hip joint injection back in February has worked well, return as needed.

## 2023-06-22 NOTE — Assessment & Plan Note (Signed)
 Left patellofemoral pain much better with PT, return as needed

## 2023-06-22 NOTE — Progress Notes (Addendum)
    Procedures performed today:    None.  Independent interpretation of notes and tests performed by another provider:   None.  Brief History, Exam, Impression, and Recommendations:    Controlled type 2 diabetes mellitus without complication, without long-term current use of insulin (HCC) Well-controlled diabetes, she is getting some nausea on her current dose of Ozempic , we will continue with Zofran  as needed for now, but if she changes her mind we will drop her dose to 0.5 mg. Checking diabetic labs today including urine microalbumin creatinine ratio. Due for retinal exam, referral downstairs.  Trochanteric bursitis and hip joint arthritis, left Left hip joint injection back in February has worked well, return as needed.  Pain of left patellofemoral joint Left patellofemoral pain much better with PT, return as needed  Hyperlipidemia Lipids still elevated, increasing to 40 mg daily, recheck in 3 months fasting.    ____________________________________________ Debby PARAS. Curtis, M.D., ABFM., CAQSM., AME. Primary Care and Sports Medicine [facility]  Adjunct Professor of [facility] Medicine  [employer]  Restaurant [employer], [employer]

## 2023-06-22 NOTE — Assessment & Plan Note (Addendum)
 Well-controlled diabetes, she is getting some nausea on her current dose of Ozempic, we will continue with Zofran as needed for now, but if she changes her mind we will drop her dose to 0.5 mg. Checking diabetic labs today including urine microalbumin creatinine ratio. Due for retinal exam, referral downstairs.

## 2023-06-23 LAB — CBC
Hematocrit: 45 % (ref 34.0–46.6)
Hemoglobin: 14.9 g/dL (ref 11.1–15.9)
MCH: 30.2 pg (ref 26.6–33.0)
MCHC: 33.1 g/dL (ref 31.5–35.7)
MCV: 91 fL (ref 79–97)
Platelets: 295 10*3/uL (ref 150–450)
RBC: 4.94 x10E6/uL (ref 3.77–5.28)
RDW: 13 % (ref 11.7–15.4)
WBC: 11.9 10*3/uL — ABNORMAL HIGH (ref 3.4–10.8)

## 2023-06-23 LAB — COMPREHENSIVE METABOLIC PANEL WITH GFR
ALT: 10 IU/L (ref 0–32)
AST: 12 IU/L (ref 0–40)
Albumin: 4.1 g/dL (ref 3.8–4.9)
Alkaline Phosphatase: 92 IU/L (ref 44–121)
BUN/Creatinine Ratio: 11 (ref 9–23)
BUN: 11 mg/dL (ref 6–24)
Bilirubin Total: <0.2 mg/dL (ref 0.0–1.2)
CO2: 23 mmol/L (ref 20–29)
Calcium: 9.5 mg/dL (ref 8.7–10.2)
Chloride: 99 mmol/L (ref 96–106)
Creatinine, Ser: 1.03 mg/dL — ABNORMAL HIGH (ref 0.57–1.00)
Globulin, Total: 2 g/dL (ref 1.5–4.5)
Glucose: 102 mg/dL — ABNORMAL HIGH (ref 70–99)
Potassium: 3.4 mmol/L — ABNORMAL LOW (ref 3.5–5.2)
Sodium: 139 mmol/L (ref 134–144)
Total Protein: 6.1 g/dL (ref 6.0–8.5)
eGFR: 65 mL/min/{1.73_m2} (ref >59)

## 2023-06-23 LAB — LIPID PANEL
Chol/HDL Ratio: 6.3 ratio — ABNORMAL HIGH (ref 0.0–4.4)
Cholesterol, Total: 234 mg/dL — ABNORMAL HIGH (ref 100–199)
HDL: 37 mg/dL — ABNORMAL LOW (ref >39)
LDL Chol Calc (NIH): 151 mg/dL — ABNORMAL HIGH (ref 0–99)
Triglycerides: 250 mg/dL — ABNORMAL HIGH (ref 0–149)
VLDL Cholesterol Cal: 46 mg/dL — ABNORMAL HIGH (ref 5–40)

## 2023-06-23 LAB — HEMOGLOBIN A1C
Est. average glucose Bld gHb Est-mCnc: 123 mg/dL
Hgb A1c MFr Bld: 5.9 % — ABNORMAL HIGH (ref 4.8–5.6)

## 2023-06-23 LAB — TSH: TSH: 1.24 u[IU]/mL (ref 0.450–4.500)

## 2023-06-23 MED ORDER — ROSUVASTATIN CALCIUM 40 MG PO TABS: 40.0000 mg | ORAL_TABLET | Freq: Every day | ORAL | 3 refills | Status: DC

## 2023-06-23 NOTE — Assessment & Plan Note (Signed)
 Lipids still elevated, increasing to 40 mg daily, recheck in 3 months fasting.

## 2023-06-23 NOTE — Addendum Note (Signed)
 Addended by: Monica Becton on: 06/23/2023 04:46 PM   Modules accepted: Orders

## 2023-06-24 LAB — MICROALBUMIN / CREATININE URINE RATIO
Creatinine, Urine: 37.6 mg/dL
Microalb/Creat Ratio: 45 mg/g{creat} — ABNORMAL HIGH (ref 0–29)
Microalbumin, Urine: 16.8 ug/mL

## 2023-06-28 ENCOUNTER — Ambulatory Visit: Payer: 59 | Admitting: Physical Therapy

## 2023-06-28 DIAGNOSIS — M6281 Muscle weakness (generalized): Secondary | ICD-10-CM

## 2023-06-28 DIAGNOSIS — R293 Abnormal posture: Secondary | ICD-10-CM

## 2023-06-28 DIAGNOSIS — R279 Unspecified lack of coordination: Secondary | ICD-10-CM
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
## 2023-07-03 ENCOUNTER — Encounter (INDEPENDENT_AMBULATORY_CARE_PROVIDER_SITE_OTHER): Payer: Self-pay | Admitting: Sports Medicine

## 2023-07-03 DIAGNOSIS — E119 Type 2 diabetes mellitus without complications: Secondary | ICD-10-CM

## 2023-07-03 MED ORDER — OZEMPIC (0.25 OR 0.5 MG/DOSE) 2 MG/1.5ML ~~LOC~~ SOPN: 0.5000 mg | PEN_INJECTOR | SUBCUTANEOUS | 11 refills | Status: DC

## 2023-07-03 NOTE — Telephone Encounter (Signed)

## 2023-07-04 ENCOUNTER — Ambulatory Visit: Admitting: Rehabilitative and Restorative Service Providers"

## 2023-07-05 ENCOUNTER — Encounter: Payer: 59 | Admitting: Physical Therapy

## 2023-07-07 NOTE — Telephone Encounter (Signed)
 Lower dosage of Semiglutide medication has been sent in by Dr. Elva Hamburger 07/03/2023.

## 2023-07-11 ENCOUNTER — Encounter: Payer: Self-pay | Admitting: Rehabilitative and Restorative Service Providers"

## 2023-07-11 ENCOUNTER — Ambulatory Visit: Admitting: Rehabilitative and Restorative Service Providers"

## 2023-07-11 DIAGNOSIS — R279 Unspecified lack of coordination: Secondary | ICD-10-CM

## 2023-07-11 DIAGNOSIS — R29898 Other symptoms and signs involving the musculoskeletal system: Secondary | ICD-10-CM

## 2023-07-11 DIAGNOSIS — R293 Abnormal posture: Secondary | ICD-10-CM

## 2023-07-11 DIAGNOSIS — M222X2 Patellofemoral disorders, left knee: Secondary | ICD-10-CM

## 2023-07-11 DIAGNOSIS — M6281 Muscle weakness (generalized): Secondary | ICD-10-CM

## 2023-07-11 NOTE — Therapy (Addendum)
 OUTPATIENT PHYSICAL THERAPY LOWER EXTREMITY TREATMENT PHYSICAL THERAPY DISCHARGE SUMMARY  Visits from Start of Care: 3  Current functional level related to goals / functional outcomes: See progress note for discharge status   Remaining deficits: Needs to continue with independent HEP    Education / Equipment: HEP    Patient agrees to discharge. Patient goals were met. Patient is being discharged due to meeting the stated rehab goals.  Florian Chauca P. Geraldo Klippel PT, MPH 07/11/23 4:39 PM     Patient Name: Jalanda Spriggs MRN: [id number] DOB:08-06-1970, 53 y.o., female Today's Date: 07/11/2023  END OF SESSION:  PT End of Session - 07/11/23 1629     Visit Number 3    Number of Visits 8    Date for PT Re-Evaluation 07/15/23    Authorization Type [employer]    PT Start Time 1450    PT Stop Time 1530    PT Time Calculation (min) 40 min    Activity Tolerance Patient tolerated treatment well              Past Medical History:  Diagnosis Date   ADD (attention deficit disorder) 06/14/2012   Anxiety    Bronchitis    Complication of anesthesia 1987   states woke up during appedentomy   Depression 06/14/2012   Hepatic steatosis 11/13/2018   Hepatic steatosis with hepatomegaly seen on MRI abd on 11/2018   History of leukocytosis 06/14/2012   Reports "WBC 32" decades ago with negative hematology specialist workup.    Hyperlipidemia 06/14/2012   Hypertension    Seasonal allergies 06/14/2012   Past Surgical History:  Procedure Laterality Date   APPENDECTOMY     BICEPT TENODESIS Right 10/10/2019   Procedure: BICEPS TENODESIS;  Surgeon: Micheline Ahr, MD;  Location: [facility];  Service: Orthopedics;  Laterality: Right;   bladder sling/mesh     INGUINAL HERNIA REPAIR     intrauterine ablation     LEFT HEART CATH AND CORONARY ANGIOGRAPHY N/A 12/09/2021   Procedure: LEFT HEART CATH AND CORONARY ANGIOGRAPHY;  Surgeon: Arty Binning, MD;  Location: [facility];   Service: Cardiovascular;  Laterality: N/A;   NASAL SINUS SURGERY     TUBAL LIGATION     VISCERAL ANGIOGRAPHY N/A 02/04/2022   Procedure: VISCERAL ANGIOGRAPHY/Mesentric;  Surgeon: Young Hensen, MD;  Location: [facility];  Service: Cardiovascular;  Laterality: N/A;   Patient Active Problem List   Diagnosis Date Noted   Pain of left patellofemoral joint 05/11/2023   Pain in left buttock 09/05/2022   Benign essential hypertension 08/18/2022   Hx of adenomatous polyp of colon 07/09/2022   Skin lesion left lower lip 05/17/2022   Controlled type 2 diabetes mellitus without complication, without long-term current use of insulin (HCC) 02/15/2022   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   Abnormal stress test 12/09/2021   Hemiparesis, left (HCC) 11/03/2021   Herpes zoster 08/06/2021   Femoral hernia 03/04/2021   Leukocytosis 01/16/2021   Left lateral abdominal pain 01/15/2021   Lumbar spondylosis 01/15/2021   Trochanteric bursitis and hip joint arthritis, left 12/04/2020   Hepatic steatosis 11/13/2018   Tobacco use 07/18/2018   Irritable bowel syndrome (IBS) 05/04/2017   Hallux valgus 12/09/2014   Angina pectoris (HCC) 07/31/2014   Other and unspecified ovarian cyst 06/25/2013   Left tennis elbow 06/25/2013   Microscopic hematuria 06/27/2012   Hyperlipidemia 06/14/2012   Seasonal allergies 06/14/2012  Depression 06/14/2012   ADD (attention deficit disorder) 06/14/2012    PCP: Dr Annemarie Kil  REFERRING PROVIDER: Dr Annemarie Kil  REFERRING DIAG: L patellar femoral pain    THERAPY DIAG:  Abnormal posture  Muscle weakness (generalized)  Unspecified lack of coordination  Patellofemoral pain syndrome of left knee  Other symptoms and signs involving the musculoskeletal system  Rationale for Evaluation and Treatment: Rehabilitation  ONSET DATE: 04/29/23  SUBJECTIVE:   SUBJECTIVE  STATEMENT: Patient reports that she has no knee pain. Working on her exercises some every day.   Eval: Patient reports on and off pain in the L knee over the past year with no known injury. Pain typically last for 2 days to 2 weeks and resolves on its own. Most recent flare up was about 1 month ago and she is not having pain currently. She would like to do some exercises to strengthen the knee and prevent flare ups of pain.   PERTINENT HISTORY: Currently being treated by pelvic PT for fecal incontinence; history of R shoulder scope 10/10/19 with PT 8/21 to 12/21  debridement; biceps tenodesis; SAD; DCR; LBP; recurrent trochanteric bursitis ~ 2 years - receives injection about every 3-4 months    PAIN:  Are you having pain? Yes: NPRS scale: 0/10 Pain location: under the knee cap (distal quad attachment) Pain description: sharp at times; constant ache when flared up Aggravating factors: stairs up > down; getting in and out a chair  Relieving factors: RICE   PRECAUTIONS: None   WEIGHT BEARING RESTRICTIONS: No  FALLS:  Has patient fallen in last 6 months? No  LIVING ENVIRONMENT: Lives with:lives with their spouse Lives in: House/apartment Stairs: No Has following equipment at home: None  OCCUPATION: Environmental health practitioner x 14 yrs  - variety of movement including up and down ladder; squatting; standing; walking; lifting Household chores; walking 1-2 x/month ~ 1 hour; yard work   PATIENT GOALS: learn exercises to Counselling psychologist   NEXT MD VISIT: 06/22/23  OBJECTIVE:  Note: Objective measures were completed at Evaluation unless otherwise noted.  DIAGNOSTIC FINDINGS: xray 3/17//25: Left knee: The alignment and joint spaces are normal. No significant osteophytes. No erosion or focal bone abnormality.   PATIENT SURVEYS:  LEFS 56/80; 70%    SENSATION: WFL  EDEMA:  none  MUSCLE LENGTH: Hamstrings: Right 80 deg; Left 90 deg ITB tight R  Quads tight R > L  Tight IR L hip    POSTURE: rounded shoulders, forward head, and LE's in ER   PALPATION: Tight lateral quad distally at insertion to patella  07/11/23 - WNL's   LOWER EXTREMITY ROM: WNL's throughout   Active ROM Right eval Left eval  Hip flexion    Hip extension    Hip abduction    Hip adduction    Hip internal rotation    Hip external rotation    Knee flexion    Knee extension    Ankle dorsiflexion    Ankle plantarflexion    Ankle inversion    Ankle eversion     (Blank rows = not tested)  LOWER EXTREMITY MMT: 5/5 bilat LE's    07/11/23: 5/5 bilat LE's  MMT Right eval Left eval  Hip flexion    Hip extension    Hip abduction    Hip adduction    Hip internal rotation    Hip external rotation    Knee flexion    Knee extension    Ankle dorsiflexion    Ankle plantarflexion  Ankle inversion    Ankle eversion     (Blank rows = not tested)  LOWER EXTREMITY SPECIAL TESTS:  Patella grind negative Poor patellar tracking with patella moving laterally   07/11/23: good improvement in patellar tracking; minimal clicking or grinding noted   FUNCTIONAL TESTS:  5 times sit to stand: 10.06 sec   GAIT: Distance walked: 40 feet Assistive device utilized: None Level of assistance: Complete Independence Comments: WFL's some ER bilat hips     [facility] Adult PT Treatment:                                                DATE: 07/11/23 Therapeutic Exercise: Nustep  L 6 x 6 min  Supine Prone Quad stretch 30 sec x 2  Plank 30 sec x 1 (shoulder pain) Sitting Hamstring stretch 30 sec x 2 Sit to stand x 10   Standing Small range wall squat with ball btn knees 10 sec x 10 pain free range   Neuromuscular re-ed: Working on posture and alignment in standing with equal wt bearing LE's  Therapeutic Activity: Sidelying  Clam blue TB 3 sec x 10 R/L  Reverse clam 3 sec x 10 R/L  Supine  Bridge with ball btn knees  Standing Step up 6 inch step 10  Lateral heel tap 6 inch step x 10 R/L  Single leg  stance 10 sec x 3 L/R  Diver 10  L/R   Lunge 5 sec x 10 R/L  Lunge back foot on step 3"x 5                                                                                                                             [facility] Adult PT Treatment:                                                DATE: 06/19/23 Therapeutic Exercise: Recumbent bike L 2 x 5 min  Supine Hamstring stretch 30 sec x 2 Piriformis stretch 30 sec x 2  Quad set x 10  SLR in ER x 10  Prone Quad stretch 30 sec x 2  Sitting Hamstring stretch 30 sec x 2 Sit to stand w/ ball btn knees - pain from lower surface - moved to wall quat  Standing Small range wall squat with ball btn knees 10 sec x 10 pain free range   Neuromuscular re-ed: Working on posture and alignment in standing with equal wt bearing LE's  Therapeutic Activity: Supine  Bridge with ball btn knees  Standing Step up 4 inch step 10 x 2 sets  Lateral heel tap 4 inch step x 10 x 2 sets  Single leg stance  10 sec x 3 L/R  Diver 10 x 2 sets L/R     TREATMENT DATE:  Education re knee pain and patellofemoral pain specifically  Postural correction - will work to avoid standing with knees hyperextended Manual work - deep tissue work 5-7 min lateral quad toward lateral patella Exercise per flow sheet    PATIENT EDUCATION:  Education details: POC; HEP  Person educated: Patient Education method: Programmer, multimedia, Facilities manager, Actor cues, Verbal cues, and Handouts Education comprehension: verbalized understanding, returned demonstration, verbal cues required, tactile cues required, and needs further education  HOME EXERCISE PROGRAM:  Access Code: [email or web address] URL: [email or web address] Date: 07/11/2023 Prepared by: Phillippe Orlick  Exercises - Prone Quadriceps Stretch with Strap  - 1 x daily - 7 x weekly - 1 sets - 3 reps - 30 sec  hold - Hooklying Hamstring Stretch with Strap  - 1 x daily - 7 x weekly - 1 sets - 3 reps - 30 sec  hold - Supine  Piriformis Stretch with Leg Straight  - 1 x daily - 7 x weekly - 1 sets - 3 reps - 30 sec  hold - Supine Quad Set  - 1 x daily - 7 x weekly - 1 sets - 10 reps - 3 sec  hold - Straight Leg Raise with External Rotation  - 1 x daily - 7 x weekly - 1 sets - 10 reps - 3-5 sec  hold - Sit to Stand with Ball Between Knees  - 1 x daily - 7 x weekly - 1 sets - 3 reps - 30 sec  hold - Wall Quarter Squat  - 1 x daily - 7 x weekly - 1-2 sets - 10 reps - 5-10 sec  hold - Mini Squat  - 1 x daily - 7 x weekly - 1 sets - 5-10 reps - 5-10 sec  hold - Single Leg Stance with Support  - 1 x daily - 7 x weekly - 1 sets - 3-5 reps - 20-30 sec  hold - Seated Hamstring Stretch  - 1 x daily - 7 x weekly - 1 sets - 3 reps - 30 sec  hold - Supine Bridge  - 1 x daily - 7 x weekly - 1-2 sets - 10 reps - 5-10 sec  hold - Wall Squat with Ball between Knees  - 1 x daily - 7 x weekly - 1-2 sets - 10 reps - 10 sec  hold - Step Up  - 1 x daily - 7 x weekly - 1 sets - 10 reps - 2 sec  hold - Lateral Step Up  - 1 x daily - 7 x weekly - 2 sets - 10 reps - 2 sec  hold - The Diver  - 1 x daily - 7 x weekly - 1 sets - 10 reps - 2-3 sec  hold - Mini Lunge  - 1 x daily - 7 x weekly - 1 sets - 3 reps - 30 sec  hold - Trail Leg Lunge  - 1 x daily - 7 x weekly - 1 sets - 3 reps - 30 sec  hold - Clamshell with Resistance  - 1 x daily - 7 x weekly - 2 sets - 10 reps - 3 sec  hold - Sidelying Reverse Clamshell  - 1 x daily - 7 x weekly - 1 sets - 3 reps - 30 sec  hold - Side Stepping with Resistance at Thighs  - 1 x daily - 7 x  weekly  ASSESSMENT:  CLINICAL IMPRESSION: Patient returns with report of no knee pain. She has been compliant with HEP - some exercises every day. Reviewed and progressed with exercises for strengthening L LE. Tolerated all exercises well with good gains in muscular control and strength. Progressed with higher level strengthening exercises. Patient will continue with independent HEP.      Aaron Aas  EVAL: Patient is a 53  y.o. female who was seen today for physical therapy evaluation and treatment for recurrent L knee pain consistent with patellofemoral syndrome. Patient is currently asymptomatic but reports flare up of L knee pain over the past year with pain lasting 2 days to 2 weeks and resolving without treatment. Patient has a history of L hip trochanteric bursitis for the past 2 years foe which she receives injections every 3-4 months. Knee pain related to hip pain. Patient stands with hyperextension of L knee. She has poor tracking of patella along the femoral condyles and functional weakness with LE abduction with squatting. Patient has tenderness and tightness to palpation along the lateral distal quad mid thigh to patella. She will benefit from PT to address problems identified.   OBJECTIVE IMPAIRMENTS: decreased mobility, decreased ROM, decreased strength, and pain.    GOALS: Goals reviewed with patient? Yes  SHORT TERM GOALS: Target date: 07/03/2023   Independent in initial HEP  Baseline: Goal status: met  2.  Patient demonstrates improved standing posture without hyperextension on knees  Baseline:  Goal status: met   LONG TERM GOALS: Target date: 07/31/2023   No incidents of L knee pain  Baseline:  Goal status: met  2.  Improve tracking of L patella with decreased palpable tightness through lateral quad  Baseline:  Goal status: met  3.  Improve squat mechanics without abduction and ER of hips  Baseline:  Goal status: met  4.  Independent in HEP  Baseline:  Goal status: met  5.  Improve LEFS score by 5-10 points  Baseline: 56/80; 70% 07/11/23: 69/80; 86.3%  Goal status: met   PLAN:  PT FREQUENCY: 2x/week  PT DURATION: 8 weeks  PLANNED INTERVENTIONS: 97110-Therapeutic exercises, 97530- Therapeutic activity, [id number]- Neuromuscular re-education, 97535- Self Care, [id number]- Manual therapy, [id number]- Gait training, [phone]- Aquatic Therapy, [phone]- Electrical stimulation (unattended),  [phone]- Ultrasound, Balance training, Stair training, Taping, Dry Needling, and Joint mobilization  PLAN FOR NEXT SESSION: d/c to independent HEP- goals accomplished. Patient independent in HEP    Tayton Decaire [place], PT 07/11/2023, 4:39 PM

## 2023-07-12 ENCOUNTER — Encounter: Payer: 59 | Admitting: Physical Therapy

## 2023-07-17 ENCOUNTER — Other Ambulatory Visit: Payer: Self-pay

## 2023-07-17 ENCOUNTER — Ambulatory Visit
Admission: RE | Admit: 2023-07-17 | Discharge: 2023-07-17 | Disposition: A | Discharge status: Home / Self Care | Source: Ambulatory Visit | Attending: Family Medicine | Admitting: Family Medicine

## 2023-07-17 ENCOUNTER — Ambulatory Visit: Payer: Self-pay | Admitting: *Deleted

## 2023-07-17 VITALS — BP 156/83 | HR 64 | Temp 97.7°F | Resp 16

## 2023-07-17 DIAGNOSIS — T8069XA Other serum reaction due to other serum, initial encounter: Secondary | ICD-10-CM | POA: Diagnosis not present

## 2023-07-17 MED ORDER — METHYLPREDNISOLONE 4 MG PO TBPK: ORAL_TABLET | ORAL | 0 refills | Status: DC

## 2023-07-17 NOTE — Telephone Encounter (Signed)
 Copied from CRM [id number]. Topic: Clinical - Red Word Triage >> Jul 17, 2023  8:26 AM Annett Bart wrote: Red Word that prompted transfer to Nurse Triage:Patient is calling in from an injection that she got last Wednesday, the injection site is hard, purple and hot to the touch also she has a headache as well and she needs to be seen,. Reason for Disposition  Nursing judgment or information in reference  Answer Assessment - Initial Assessment Questions 1. REASON FOR CALL: "What is your main concern right now?"     Injection site reaction- pneumonia/Hep B 2. ONSET: "When did the reaction start?"     Wednesday injection, Thursday- red soreness- and has progress to hard,red lump 3. SEVERITY: "How bad is the area?"     Not feeling well, headache 4. FEVER: "Do you have a fever?"     Has not checked 5. OTHER SYMPTOMS: "Do you have any other new symptoms?"     Red and purple now, hard  6. TREATMENTS AND RESPONSE: "What have you done so far to try to make this better? What medicines have you used?"     Ice , ibuprofen   Protocols used: No Guideline Available-[facility]

## 2023-07-17 NOTE — Discharge Instructions (Signed)
 Take the Medrol  pack as directed.  Take all of day 1 today Use warm compresses to the area Move your arm is much as possible See Dr. Elva Hamburger if not improving by next week

## 2023-07-17 NOTE — ED Triage Notes (Signed)
 April 30th had hep b and pna vaccine to left arm, is having a reaction. Reports she has hardness, itching, burning, warm to touch to area. Has been taking ibuprofen  and applied ice. Was red but now is purple.

## 2023-07-17 NOTE — ED Provider Notes (Signed)
 [facility]    CSN: [id number] Arrival date & time: 07/17/23  1002      History   Chief Complaint Chief Complaint  Patient presents with   Allergic Reaction    HPI Sarah Nicholson is a 53 y.o. female.   Patient had 3 vaccinations administered on Wednesday, July 12, 2023.  In her right deltoid area she received COVID updated vaccine.  In her left deltoid area she received both a pneumonia vaccine and her third hepatitis B, last of the series.  She states within 24 hours she had a headache and some bodyaches.  Her arm got swollen and red.  She is here because she continues to feel bad.  She is tired.  Has a headache.  No longer is having sweats.  Her arm has 2 hard knots that are quite tender.  There is still some erythema of the overlying skin.  She is taking ibuprofen  800 mg 3 times a day.  She wants to know what else to do to help with her symptoms    Past Medical History:  Diagnosis Date   ADD (attention deficit disorder) 06/14/2012   Anxiety    Bronchitis    Complication of anesthesia 1987   states woke up during appedentomy   Depression 06/14/2012   Hepatic steatosis 11/13/2018   Hepatic steatosis with hepatomegaly seen on MRI abd on 11/2018   History of leukocytosis 06/14/2012   Reports "WBC 32" decades ago with negative hematology specialist workup.    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   Abnormal stress test 12/09/2021   Hemiparesis,  left (HCC) 11/03/2021   Herpes zoster 08/06/2021   Femoral hernia 03/04/2021   Leukocytosis 01/16/2021   Left lateral abdominal pain 01/15/2021   Lumbar spondylosis 01/15/2021   Trochanteric bursitis and hip joint arthritis, left 12/04/2020   Hepatic steatosis 11/13/2018   Tobacco use 07/18/2018   Irritable bowel syndrome (IBS) 05/04/2017   Hallux valgus 12/09/2014   Angina pectoris (HCC) 07/31/2014   Other and unspecified ovarian cyst 06/25/2013   Left tennis elbow 06/25/2013   Microscopic hematuria 06/27/2012   Hyperlipidemia 06/14/2012   Seasonal allergies 06/14/2012   Depression 06/14/2012   ADD (attention deficit disorder) 06/14/2012    Past Surgical History:  Procedure Laterality Date   APPENDECTOMY     BICEPT TENODESIS Right 10/10/2019   Procedure: BICEPS TENODESIS;  Surgeon: Micheline Ahr, MD;  Location: [facility];  Service: Orthopedics;  Laterality: Right;   bladder sling/mesh     INGUINAL HERNIA REPAIR     intrauterine ablation     LEFT HEART CATH AND CORONARY ANGIOGRAPHY N/A 12/09/2021   Procedure: LEFT HEART CATH AND CORONARY ANGIOGRAPHY;  Surgeon: Arty Binning, MD;  [facility];  Service: Cardiovascular;  Laterality: N/A;   NASAL SINUS SURGERY     TUBAL LIGATION     VISCERAL ANGIOGRAPHY N/A 02/04/2022   Procedure: VISCERAL ANGIOGRAPHY/Mesentric;  Surgeon: Young Hensen, MD;  Location: [facility];  Service: Cardiovascular;  Laterality: N/A;    OB History     Gravida  5   Para  4   Term  2   Preterm  2   AB  1   Living  3      SAB      IAB      Ectopic      Multiple      Live Births               Home Medications    Prior to Admission medications   Medication Sig Start Date End Date Taking? Authorizing Provider  methylPREDNISolone  (MEDROL  DOSEPAK) 4 MG TBPK tablet tad 07/17/23  Yes Stephany Ehrich, MD  aspirin  EC 81 MG tablet Take 1 tablet (81 mg total) by mouth daily. 01/30/23    Gean Keels, MD  cetirizine  (ZYRTEC ) 10 MG tablet Take 20 mg by mouth daily as needed for allergies ([place]).    [provider]  clonazePAM  (KLONOPIN ) 0.5 MG tablet Take 1 tablet (0.5 mg total) by mouth 2 (two) times daily as needed for anxiety. 09/23/22   Gean Keels, MD  enalapril  (VASOTEC ) 10 MG tablet Take 1 tablet (10 mg total) by mouth 2 (two) times daily. 05/25/23   Gean Keels, MD  EPINEPHrine  0.3 mg/0.3 mL IJ SOAJ injection Inject 0.3 mg into the muscle as needed for anaphylaxis. 09/23/22   Gean Keels, MD  escitalopram  (LEXAPRO ) 20 MG tablet Take 2 tablets (40 mg total) by mouth daily. 11/17/22   Gean Keels, MD  ezetimibe  (ZETIA ) 10 MG tablet TAKE 1 TABLET(10 MG) BY MOUTH DAILY 05/22/23   Marlyse Single T, PA-C  gabapentin  (NEURONTIN ) 300 MG capsule TAKE 1 CAPSULE(300 MG) BY MOUTH THREE TIMES DAILY 11/02/22   Gean Keels, MD  Hyoscyamine  Sulfate SL (LEVSIN/SL) 0.125 MG SUBL Dissolve one tablet every 4-6 hours as needed for abdominal pain/spasms 03/18/22   Esterwood, Amy S, PA-C  metFORMIN  (GLUCOPHAGE -XR) 750 MG 24 hr tablet Take 1 tablet (750 mg total) by mouth daily with breakfast. 01/30/23   Gean Keels, MD  nicotine  (NICODERM CQ  - DOSED IN MG/24 HOURS) 21 mg/24hr patch Place 1 patch (21 mg total) onto the skin daily. 01/31/22   Sonny Dust, MD  nitroGLYCERIN  (NITROSTAT ) 0.4 MG SL tablet Place 1 tablet (0.4 mg total) under the tongue every 5 (five) minutes as needed for chest pain. 12/30/21   Gerald Kitty., NP  ondansetron  (ZOFRAN -ODT) 8 MG disintegrating tablet Take 1 tablet (8 mg total) by mouth every 8 (eight) hours as needed for nausea. 04/14/23   Gean Keels, MD  rosuvastatin  (CRESTOR ) 40 MG tablet Take 1 tablet (40 mg total) by mouth daily. 06/23/23   Gean Keels, MD  Semaglutide ,0.25 or 0.5MG /DOS, (OZEMPIC , 0.25 OR 0.5 MG/DOSE,) 2 MG/1.5ML SOPN Inject 0.5 mg into the skin  once a week. 07/03/23   Gean Keels, MD    Family History Family History  Problem Relation Age of Onset   Depression Mother    Diabetes Father    Hyperlipidemia Father    Hypertension Father    Depression Sister    Breast cancer Maternal Grandmother    Crohn's disease Maternal Grandmother    High blood pressure Paternal Grandfather    Diabetes Paternal Grandfather    Depression Son    Sleep apnea Neg Hx    Liver disease Neg Hx  Esophageal cancer Neg Hx    Stomach cancer Neg Hx     Social History Social History   Tobacco Use   Smoking status: Every Day    Current packs/day: 1.00    Types: Cigarettes   Smokeless tobacco: Never  Vaping Use   Vaping status: Never Used  Substance Use Topics   Alcohol use: Not Currently   Drug use: No     Allergies   Macrobid [nitrofurantoin], Other, Tetanus toxoids, Thimerosal (thiomersal), and Thimerosal   Review of Systems Review of Systems See HPI  Physical Exam Triage Vital Signs ED Triage Vitals  Encounter Vitals Group     BP 07/17/23 1007 (!) 156/83     Systolic BP Percentile --      Diastolic BP Percentile --      Pulse Rate 07/17/23 1007 64     Resp 07/17/23 1007 16     Temp 07/17/23 1007 97.7 F (36.5 C)     Temp src --      SpO2 07/17/23 1007 96 %     Weight --      Height --      Head Circumference --      Peak Flow --      Pain Score 07/17/23 1011 4     Pain Loc --      Pain Education --      Exclude from Growth Chart --    No data found.  Updated Vital Signs BP (!) 156/83   Pulse 64   Temp 97.7 F (36.5 C)   Resp 16   LMP  (LMP Unknown)   SpO2 96%      Physical Exam Constitutional:      General: She is not in acute distress.    Appearance: She is well-developed. She is ill-appearing.  HENT:     Head: Normocephalic and atraumatic.  Eyes:     Conjunctiva/sclera: Conjunctivae normal.     Pupils: Pupils are equal, round, and reactive to light.  Cardiovascular:     Rate and  Rhythm: Normal rate.  Pulmonary:     Effort: Pulmonary effort is normal. No respiratory distress.     Breath sounds: Normal breath sounds.  Musculoskeletal:        General: Normal range of motion.     Cervical back: Normal range of motion.  Skin:    General: Skin is warm and dry.     Comments: Left deltoid has an area of induration that measures 4 cm, below this is a second smaller area approximately 3 cm across.  Both of these "knots" are firm and very tender.  Slight erythema of overlying skin.  Neurological:     Mental Status: She is alert.      UC Treatments / Results  Labs (all labs ordered are listed, but only abnormal results are displayed) Labs Reviewed - No data to display  EKG   Radiology No results found.  Procedures Procedures (including critical care time)  Medications Ordered in UC Medications - No data to display  Initial Impression / Assessment and Plan / UC Course  I have reviewed the triage vital signs and the nursing notes.  Pertinent labs & imaging results that were available during my care of the patient were reviewed by me and considered in my medical decision making (see chart for details).     Since patient has failed ibuprofen  we will give her a steroid pack. Case discussed with her primary care provider Dr. Sandy Crumb.  He will see her back if not better in a week or 2 Final Clinical Impressions(s) / UC Diagnoses   Final diagnoses:  Allergic reaction to vaccine     Discharge Instructions      Take the Medrol  pack as directed.  Take all of day 1 today Use warm compresses to the area Move your arm is much as possible See Dr. Elva Hamburger if not improving by next week   ED Prescriptions     Medication Sig Dispense Auth. Provider   methylPREDNISolone  (MEDROL  DOSEPAK) 4 MG TBPK tablet tad 21 tablet Stephany Ehrich, MD      PDMP not reviewed this encounter.   Stephany Ehrich, MD 07/17/23 [phone]

## 2023-07-17 NOTE — Telephone Encounter (Signed)
 Patient has been scheduled on 07/18/23. Per Isabella Mao - the patient has two vaccines (Prevnar & Hep B) completed at [facility] pharmacy.

## 2023-07-18 ENCOUNTER — Ambulatory Visit: Admitting: Sports Medicine

## 2023-07-18 NOTE — Progress Notes (Unsigned)
 PATIENT: Sarah Nicholson DOB: 1970/03/21  REASON FOR VISIT: follow up HISTORY FROM: patient  No chief complaint on file.    HISTORY OF PRESENT ILLNESS:  07/18/23 ALL:  Kirsten Kuruvilla is a 53 y.o. female here today for follow up for OSA on CPAP. She was last seen by Dr Omar Bibber 07/2022 and doing well with starting therapy. Since,     HISTORY: (copied from Dr Dail Drought previous note)  Ms. Melissa Gohde is a 53 year old female with an underlying medical history of ADD, migraine headaches, hypertension, hyperlipidemia, allergies, depression, anxiety, hepatic steatosis, dizziness, and obesity, who presents for follow-up consultation of her sleep apnea, after interim testing and starting home autoPAP therapy. The patient is unaccompanied today. I first met her at the request of Dr. Salli Crawley on 12/29/2021, at which time she reported snoring and excessive daytime somnolence.  She was advised to proceed with a sleep study.  She had a home sleep test through our office on 02/22/2022 which indicated moderate obstructive sleep apnea with a total AHI of 20.7/hour and O2 nadir of 85%.  Mild to moderate snoring was detected.  She was advised to proceed with home AutoPap therapy.  Her set up date was 03/22/2022.  She has a ResMed air sense 10 AutoSet machine.  Her DME company is [employer].   Today, 07/19/2022: I reviewed her AutoPap compliance data from 06/18/2022 through 07/17/2022, which is a total of 30 days, during which time she used her machine every night with percent use days greater than 4 hours at 100%, indicating superb compliance with an average usage of 8 hours and 5 minutes, residual AHI at goal at 3.7/h, 95th percentile of pressure at 10.1 cm with a range of 5 to 12 cm with EPR of 3.  Leak acceptable with the 95th percentile at 8.8 L/min.  She reports doing well, he has adjusted well to her AutoPap, she reports improvement in her sleep consolidation and sleep quality, wakes up better rested.   Epworth sleepiness score is 5 out of 24, previously was 11.  She is using a nasal mask with good success, she changed from a small to a small wide for better tolerance.  She is working on weight loss and has lost over 20 pounds since we first met.  She is very pleased with her outcome thus far and very motivated to continue with treatment.   REVIEW OF SYSTEMS: Out of a complete 14 system review of symptoms, the patient complains only of the following symptoms, and all other reviewed systems are negative.  ESS:  ALLERGIES: Allergies  Allergen Reactions   Macrobid [Nitrofurantoin] Hives and Rash   Other Hives    Preservative used in vaccinations   Tetanus Toxoids Swelling    Pt states she was told the Tdap injection would have to be administered in the hospital  After discussion on 11/10 pt remembers being told it was thimersol in the tetanus 30+years ago that caused the swelling.     Thimerosal (Thiomersal) Rash   Thimerosal Rash    HOME MEDICATIONS: Outpatient Medications Prior to Visit  Medication Sig Dispense Refill   aspirin  EC 81 MG tablet Take 1 tablet (81 mg total) by mouth daily. 90 tablet 3   cetirizine  (ZYRTEC ) 10 MG tablet Take 20 mg by mouth daily as needed for allergies ([place]).     clonazePAM  (KLONOPIN ) 0.5 MG tablet Take 1 tablet (0.5 mg total) by mouth 2 (two) times daily as needed for anxiety.  30 tablet 3   enalapril  (VASOTEC ) 10 MG tablet Take 1 tablet (10 mg total) by mouth 2 (two) times daily. 60 tablet 1   EPINEPHrine  0.3 mg/0.3 mL IJ SOAJ injection Inject 0.3 mg into the muscle as needed for anaphylaxis. 1 each 1   escitalopram  (LEXAPRO ) 20 MG tablet Take 2 tablets (40 mg total) by mouth daily. 180 tablet 3   ezetimibe  (ZETIA ) 10 MG tablet TAKE 1 TABLET(10 MG) BY MOUTH DAILY 15 tablet 0   gabapentin  (NEURONTIN ) 300 MG capsule TAKE 1 CAPSULE(300 MG) BY MOUTH THREE TIMES DAILY 270 capsule 3   Hyoscyamine  Sulfate SL (LEVSIN/SL) 0.125 MG SUBL Dissolve one tablet  every 4-6 hours as needed for abdominal pain/spasms 30 tablet 4   metFORMIN  (GLUCOPHAGE -XR) 750 MG 24 hr tablet Take 1 tablet (750 mg total) by mouth daily with breakfast. 30 tablet 11   methylPREDNISolone  (MEDROL  DOSEPAK) 4 MG TBPK tablet tad 21 tablet 0   nicotine  (NICODERM CQ  - DOSED IN MG/24 HOURS) 21 mg/24hr patch Place 1 patch (21 mg total) onto the skin daily. 28 patch 3   nitroGLYCERIN  (NITROSTAT ) 0.4 MG SL tablet Place 1 tablet (0.4 mg total) under the tongue every 5 (five) minutes as needed for chest pain. 25 tablet 3   ondansetron  (ZOFRAN -ODT) 8 MG disintegrating tablet Take 1 tablet (8 mg total) by mouth every 8 (eight) hours as needed for nausea. 20 tablet 3   rosuvastatin  (CRESTOR ) 40 MG tablet Take 1 tablet (40 mg total) by mouth daily. 90 tablet 3   Semaglutide ,0.25 or 0.5MG /DOS, (OZEMPIC , 0.25 OR 0.5 MG/DOSE,) 2 MG/1.5ML SOPN Inject 0.5 mg into the skin once a week. 3 mL 11   No facility-administered medications prior to visit.    PAST MEDICAL HISTORY: Past Medical History:  Diagnosis Date   ADD (attention deficit disorder) 06/14/2012   Anxiety    Bronchitis    Complication of anesthesia 1987   states woke up during appedentomy   Depression 06/14/2012   Hepatic steatosis 11/13/2018   Hepatic steatosis with hepatomegaly seen on MRI abd on 11/2018   History of leukocytosis 06/14/2012   Reports "WBC 32" decades ago with negative hematology specialist workup.    Hyperlipidemia 06/14/2012   Hypertension    Seasonal allergies 06/14/2012    PAST SURGICAL HISTORY: Past Surgical History:  Procedure Laterality Date   APPENDECTOMY     BICEPT TENODESIS Right 10/10/2019   Procedure: BICEPS TENODESIS;  Surgeon: Micheline Ahr, MD;  Location: [facility];  Service: Orthopedics;  Laterality: Right;   bladder sling/mesh     INGUINAL HERNIA REPAIR     intrauterine ablation     LEFT HEART CATH AND CORONARY ANGIOGRAPHY N/A 12/09/2021   Procedure: LEFT HEART CATH AND  CORONARY ANGIOGRAPHY;  Surgeon: Arty Binning, MD;  [facility]: [facility];  Service: Cardiovascular;  Laterality: N/A;   NASAL SINUS SURGERY     TUBAL LIGATION     VISCERAL ANGIOGRAPHY N/A 02/04/2022   Procedure: VISCERAL ANGIOGRAPHY/Mesentric;  Surgeon: Young Hensen, MD;  Location: [facility];  Service: Cardiovascular;  Laterality: N/A;    FAMILY HISTORY: Family History  Problem Relation Age of Onset   Depression Mother    Diabetes Father    Hyperlipidemia Father    Hypertension Father    Depression Sister    Breast cancer Maternal Grandmother    Crohn's disease Maternal Grandmother    High blood pressure Paternal Grandfather    Diabetes Paternal Grandfather  Depression Son    Sleep apnea Neg Hx    Liver disease Neg Hx    Esophageal cancer Neg Hx    Stomach cancer Neg Hx     SOCIAL HISTORY: Social History   Socioeconomic History   Marital status: Married    Spouse name: Not on file   Number of children: 3   Years of education: Not on file   Highest education level: Bachelor's degree (e.g., BA, AB, BS)  Occupational History   Occupation: [employer]  Tobacco Use   Smoking status: Every Day    Current packs/day: 1.00    Types: Cigarettes   Smokeless tobacco: Never  Vaping Use   Vaping status: Never Used  Substance and Sexual Activity   Alcohol use: Not Currently   Drug use: No   Sexual activity: Yes    Birth control/protection: Post-menopausal  Other Topics Concern   Not on file  Social History Narrative   Not on file   Social Drivers of Health   Financial Resource Strain: Low Risk  (03/16/2023)   Overall Financial Resource Strain (CARDIA)    Difficulty of Paying Living Expenses: Not very hard  Food Insecurity: No Food Insecurity (03/16/2023)   Hunger Vital Sign    Worried About Running Out of Food in the Last Year: Never true    Ran Out of Food in the Last Year: Never true  Transportation Needs: No Transportation Needs (03/16/2023)    PRAPARE - Administrator, Civil Service (Medical): No    Lack of Transportation (Non-Medical): No  Physical Activity: Insufficiently Active (03/16/2023)   Exercise Vital Sign    Days of Exercise per Week: 2 days    Minutes of Exercise per Session: 20 min  Stress: Stress Concern Present (03/16/2023)   [employer] of Occupational Health - Occupational Stress Questionnaire    Feeling of Stress : To some extent  Social Connections: Socially Isolated (03/16/2023)   Social Connection and Isolation Panel [NHANES]    Frequency of Communication with Friends and Family: Once a week    Frequency of Social Gatherings with Friends and Family: Never    Attends Religious Services: Never    Database administrator or Organizations: No    Attends Engineer, structural: Not on file    Marital Status: Married  Intimate Partner Violence: Unknown (06/17/2021)   Received from [employer], [facility]   HITS    Physically Hurt: Not on file    Insult or Talk Down To: Not on file    Threaten Physical Harm: Not on file    Scream or Curse: Not on file     PHYSICAL EXAM  There were no vitals filed for this visit. There is no height or weight on file to calculate BMI.  Generalized: Well developed, in no acute distress  Cardiology: normal rate and rhythm, no murmur noted Respiratory: clear to auscultation bilaterally  Neurological examination  Mentation: Alert oriented to time, place, history taking. Follows all commands speech and language fluent Cranial nerve II-XII: Pupils were equal round reactive to light. Extraocular movements were full, visual field were full  Motor: The motor testing reveals 5 over 5 strength of all 4 extremities. Good symmetric motor tone is noted throughout.  Gait and station: Gait is normal.    DIAGNOSTIC DATA (LABS, IMAGING, TESTING) - I reviewed patient records, labs, notes, testing and imaging myself where available.      No data to display  Lab Results  Component Value Date   WBC 11.9 (H) 06/22/2023   HGB 14.9 06/22/2023   HCT 45.0 06/22/2023   MCV 91 06/22/2023   PLT 295 06/22/2023      Component Value Date/Time   NA 139 06/22/2023 1436   K 3.4 (L) 06/22/2023 1436   CL 99 06/22/2023 1436   CO2 23 06/22/2023 1436   GLUCOSE 102 (H) 06/22/2023 1436   GLUCOSE 100 (H) 08/25/2022 0830   BUN 11 06/22/2023 1436   CREATININE 1.03 (H) 06/22/2023 1436   CREATININE 1.07 (H) 08/25/2022 0830   CREATININE 1.09 (H) 11/03/2021 0000   CALCIUM  9.5 06/22/2023 1436   PROT 6.1 06/22/2023 1436   ALBUMIN 4.1 06/22/2023 1436   AST 12 06/22/2023 1436   AST 13 (L) 08/25/2022 0830   ALT 10 06/22/2023 1436   ALT 13 08/25/2022 0830   ALKPHOS 92 06/22/2023 1436   BILITOT <0.2 06/22/2023 1436   BILITOT 0.4 08/25/2022 0830   GFRNONAA >60 08/25/2022 0830   GFRNONAA 67 03/16/2020 1153   GFRAA 77 03/16/2020 1153   Lab Results  Component Value Date   CHOL 234 (H) 06/22/2023   HDL 37 (L) 06/22/2023   LDLCALC 151 (H) 06/22/2023   TRIG 250 (H) 06/22/2023   CHOLHDL 6.3 (H) 06/22/2023   Lab Results  Component Value Date   HGBA1C 5.9 (H) 06/22/2023   Lab Results  Component Value Date   VITAMINB12 413 11/03/2021   Lab Results  Component Value Date   TSH 1.240 06/22/2023     ASSESSMENT AND PLAN 53 y.o. year old female  has a past medical history of ADD (attention deficit disorder) (06/14/2012), Anxiety, Bronchitis, Complication of anesthesia (1987), Depression (06/14/2012), Hepatic steatosis (11/13/2018), History of leukocytosis (06/14/2012), Hyperlipidemia (06/14/2012), Hypertension, and Seasonal allergies (06/14/2012). here with   No diagnosis found.    Dorothie Smyly is doing well on CPAP therapy. Compliance report reveals ***. *** was encouraged to continue using CPAP nightly and for greater than 4 hours each night. We will update supply orders as indicated. Risks of untreated sleep apnea review and education  materials provided. Healthy lifestyle habits encouraged. *** will follow up in ***, sooner if needed. *** verbalizes understanding and agreement with this plan.    No orders of the defined types were placed in this encounter.    No orders of the defined types were placed in this encounter.     Terrilyn Fick, FNP-C 07/18/2023, 10:33 AM [facility] [address], [address] [place], [place] [address] [phone]

## 2023-07-18 NOTE — Patient Instructions (Signed)

## 2023-07-19 NOTE — Progress Notes (Unsigned)
 Sarah Nicholson

## 2023-07-20 ENCOUNTER — Encounter: Payer: Self-pay | Admitting: Family Medicine

## 2023-07-20 ENCOUNTER — Ambulatory Visit (INDEPENDENT_AMBULATORY_CARE_PROVIDER_SITE_OTHER): Payer: 59 | Admitting: Family Medicine

## 2023-07-20 VITALS — BP 117/82 | HR 69 | Ht 66.0 in | Wt 188.0 lb

## 2023-07-20 DIAGNOSIS — G4733 Obstructive sleep apnea (adult) (pediatric): Secondary | ICD-10-CM | POA: Diagnosis not present

## 2023-07-21 ENCOUNTER — Other Ambulatory Visit: Payer: Self-pay

## 2023-07-21 MED ORDER — ENALAPRIL MALEATE 10 MG PO TABS: 10.0000 mg | ORAL_TABLET | Freq: Two times a day (BID) | ORAL | 3 refills | Status: DC

## 2023-07-30 NOTE — Progress Notes (Signed)
 Cardiology Office Note    Patient Name: Sarah Nicholson Date of Encounter: 07/30/2023  Primary Care Provider:  Gean Keels, MD Primary Cardiologist:  None Primary Electrophysiologist: None   Past Medical History    Past Medical History:  Diagnosis Date   ADD (attention deficit disorder) 06/14/2012   Anxiety    Bronchitis    Complication of anesthesia 1987   states woke up during appedentomy   Depression 06/14/2012   Hepatic steatosis 11/13/2018   Hepatic steatosis with hepatomegaly seen on MRI abd on 11/2018   History of leukocytosis 06/14/2012   Reports "WBC 32" decades ago with negative hematology specialist workup.    Hyperlipidemia 06/14/2012   Hypertension    Seasonal allergies 06/14/2012    History of Present Illness  Sarah Nicholson is a 53 y.o. female with PMH of nonobstructive CAD (INOCA) HTN, HLD, ADD, anxiety, obesity, tobacco abuse, aortic atherosclerosis depression who presents today for annual follow-up.  [name] was last seen by Marlyse Single, PA on 04/29/2022 for follow-up and reported doing overall well with angina well-controlled.  She also denies shortness of breath or syncope since previous visit.  Her blood pressure during visit was stable at 126/80 and she had fasting lipids with LFTs checked that showed elevated triglycerides with recommendation to start Vascepa and was also continued on ezetimibe  and Crestor .  She most recently had lipids checked by her PCP that showed elevated numbers with rosuvastatin  increased to 20 mg daily.  [name] presents today for annual follow-up.  She reports since her previous visit she has been doing well with no new cardiac complaints.She has not experienced any recurrence of chest pain since last summer and has not needed to use nitroglycerin  recently. Her isosorbide  was previously reduced and eventually discontinued due to low blood pressure. She confirms having nitroglycerin  available at home if  needed. There was a lapse in taking her cholesterol medication, Crestor , due to it being missing from her supply. A repeat cholesterol test has been ordered to monitor her levels. She is using a CPAP machine for obstructive sleep apnea, which is functioning well and improving her condition. She started taking Ozempic  for blood sugar management but experienced nausea at the 1 mg dose. This improved after reducing the dose to 0.5 mg. She has lost weight, decreasing from 203 pounds in January to 189 pounds currently. She experienced an allergic reaction to a recent pneumonia or hepatitis B vaccine, resulting in a large, tender knot at the injection site. She is currently smoking a pack of cigarettes a day but is preparing to quit again. She previously tried Wellbutrin and Chantix  for smoking cessation but is now on Lexapro  for depression, which does not help with cravings. She reports high stress levels but no stress-induced chest pain or elevated blood pressure. She is planning to start walking with her husband and dogs for exercise. Patient denies chest pain, palpitations, dyspnea, PND, orthopnea, nausea, vomiting, dizziness, syncope, edema, weight gain, or early satiety.  Discussed the use of AI scribe software for clinical note transcription with the patient, who gave verbal consent to proceed.  History of Present Illness   Review of Systems  Please see the history of present illness.    All other systems reviewed and are otherwise negative except as noted above.  Physical Exam     Wt Readings from Last 3 Encounters:  07/20/23 188 lb (85.3 kg)  06/22/23 190 lb 3.2 oz (86.3 kg)  03/20/23 203 lb (  92.1 kg)   ZO:XWRUE were no vitals filed for this visit.,There is no height or weight on file to calculate BMI. GEN: Well nourished, well developed in no acute distress Neck: No JVD; No carotid bruits Pulmonary: Clear to auscultation without rales, wheezing or rhonchi  Cardiovascular: Normal rate.  Regular rhythm. Normal S1. Normal S2.   Murmurs: There is no murmur.  ABDOMEN: Soft, non-tender, non-distended EXTREMITIES:  No edema; No deformity   EKG/LABS/ Recent Cardiac Studies   ECG personally reviewed by me today -sinus rhythm with rate of 64 bpm and no acute changes consistent with previous EKG.  Risk Assessment/Calculations:          Lab Results  Component Value Date   WBC 11.9 (H) 06/22/2023   HGB 14.9 06/22/2023   HCT 45.0 06/22/2023   MCV 91 06/22/2023   PLT 295 06/22/2023   Lab Results  Component Value Date   CREATININE 1.03 (H) 06/22/2023   BUN 11 06/22/2023   NA 139 06/22/2023   K 3.4 (L) 06/22/2023   CL 99 06/22/2023   CO2 23 06/22/2023   Lab Results  Component Value Date   CHOL 234 (H) 06/22/2023   HDL 37 (L) 06/22/2023   LDLCALC 151 (H) 06/22/2023   TRIG 250 (H) 06/22/2023   CHOLHDL 6.3 (H) 06/22/2023    Lab Results  Component Value Date   HGBA1C 5.9 (H) 06/22/2023   Assessment & Plan   Assessment & Plan  1.Nonobstructive CAD: -s/p LHC with normal coronaries with possible angina with nonobstructive CAD -Today patient reports No chest pain recurrence.  EKG shows no ischemia. Continued monitoring due to non-obstructive angina. - Keep nitroglycerin  available for use if needed.  2.Essential hypertension:  -Patient's blood pressure today was stable at 114/72 - Continue low-sodium heart healthy diet  3.  Hyperlipidemia: - Lipids currently followed by PCP with plan for repeat LFT and lipids after recent medication adjustment. - Continue ezetimibe  10 mg, Crestor  40 mg  4.  Tobacco abuse: -Smoking a pack a day. Discussed Wellbutrin and Chantix  for cessation. Lexapro  ineffective for cravings.  5.  History of OSA: - Patient currently on CPAP managed by neurology. - Reports nightly compliance  6. Diabetes mellitus: - Recent hemoglobin A1c was stable at 5.9 Started on Ozempic , reduced dose to 0.5 mg due to nausea. 14-pound weight loss since  January.  Disposition: Follow-up with None or APP in 12 months    Signed, Francene Ing, Retha Cast, NP 07/30/2023, 4:20 PM [facility]

## 2023-07-31 ENCOUNTER — Ambulatory Visit: Attending: Nurse Practitioner | Admitting: Nurse Practitioner

## 2023-07-31 ENCOUNTER — Encounter: Payer: Self-pay | Admitting: Nurse Practitioner

## 2023-07-31 VITALS — BP 114/72 | HR 64 | Ht 66.0 in | Wt 189.0 lb

## 2023-07-31 DIAGNOSIS — I2510 Atherosclerotic heart disease of native coronary artery without angina pectoris: Secondary | ICD-10-CM | POA: Diagnosis not present

## 2023-07-31 DIAGNOSIS — Z720 Tobacco use: Secondary | ICD-10-CM | POA: Diagnosis not present

## 2023-07-31 DIAGNOSIS — I10 Essential (primary) hypertension: Secondary | ICD-10-CM | POA: Diagnosis not present

## 2023-07-31 DIAGNOSIS — E782 Mixed hyperlipidemia: Secondary | ICD-10-CM

## 2023-07-31 DIAGNOSIS — G4733 Obstructive sleep apnea (adult) (pediatric): Secondary | ICD-10-CM

## 2023-07-31 MED ORDER — EZETIMIBE 10 MG PO TABS: 10.0000 mg | ORAL_TABLET | Freq: Every day | ORAL | 3 refills | Status: AC

## 2023-07-31 NOTE — Patient Instructions (Addendum)
 Medication Instructions:  Your physician recommends that you continue on your current medications as directed. Please refer to the Current Medication list given to you today.  *If you need a refill on your cardiac medications before your next appointment, please call your pharmacy*  Lab Work: None Ordered If you have labs (blood work) drawn today and your tests are completely normal, you will receive your results only by: MyChart Message (if you have MyChart) OR A paper copy in the mail If you have any lab test that is abnormal or we need to change your treatment, we will call you to review the results.  Testing/Procedures: None ordered  Follow-Up: At [facility], you and your health needs are our priority.  As part of our continuing mission to provide you with exceptional heart care, our providers are all part of one team.  This team includes your primary Cardiologist (physician) and Advanced Practice Providers or APPs (Physician Assistants and Nurse Practitioners) who all work together to provide you with the care you need, when you need it.  Your next appointment:   12 month(s)  Provider:   Synetta Eves, MD    We recommend signing up for the patient portal called "MyChart".  Sign up information is provided on this After Visit Summary.  MyChart is used to connect with patients for Virtual Visits (Telemedicine).  Patients are able to view lab/test results, encounter notes, upcoming appointments, etc.  Non-urgent messages can be sent to your provider as well.   To learn more about what you can do with MyChart, go to [email or web address].   Other Instructions

## 2023-08-10 ENCOUNTER — Ambulatory Visit
Admission: EM | Admit: 2023-08-10 | Discharge: 2023-08-10 | Disposition: A | Discharge status: Home / Self Care | Attending: Family Medicine | Admitting: Family Medicine

## 2023-08-10 ENCOUNTER — Other Ambulatory Visit: Payer: Self-pay

## 2023-08-10 DIAGNOSIS — H5460 Unqualified visual loss, one eye, unspecified: Secondary | ICD-10-CM | POA: Diagnosis not present

## 2023-08-10 NOTE — ED Provider Notes (Signed)
 [facility]    CSN: [id number] Arrival date & time: 08/10/23  4098      History   Chief Complaint Chief Complaint  Patient presents with   Eye Problem    HPI Sarah Nicholson is a 53 y.o. female.   Pleasant 53 year old woman brought in by her husband for evaluation of sudden loss of vision.  She states that she was driving home from work last night and noticed at a stop sign that she could not see the sign out of her right eye.  The right eye was blurry in the left eye was normal.  She also had a vague headache on the right side.  She states she has had a history of an ocular migraine so she was not overly concerned.  She states that she thought she could sleep it off and feels better today.  Today, however, she still has diminished vision in her right eye.  It is blurry.  Because of the acute change in vision she states that she feels a little bit off balance, she thinks is her depth perception. She has no numbness or weakness in her extremities.  No cognitive change.  No dizziness. Patient does have a history of cigarette smoking, vascular disease, hypertension, hyperlipidemia and well-controlled diabetes.    Past Medical History:  Diagnosis Date   ADD (attention deficit disorder) 06/14/2012   Anxiety    Bronchitis    Complication of anesthesia 1987   states woke up during appedentomy   Depression 06/14/2012   Hepatic steatosis 11/13/2018   Hepatic steatosis with hepatomegaly seen on MRI abd on 11/2018   History of leukocytosis 06/14/2012   Reports "WBC 32" decades ago with negative hematology specialist workup.    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   Abnormal stress test 12/09/2021   Hemiparesis, left (HCC) 11/03/2021   Herpes zoster 08/06/2021   Femoral hernia 03/04/2021   Leukocytosis 01/16/2021   Left lateral abdominal pain 01/15/2021   Lumbar spondylosis 01/15/2021   Trochanteric bursitis and hip joint arthritis, left 12/04/2020   Hepatic steatosis 11/13/2018   Tobacco use 07/18/2018   Irritable bowel syndrome (IBS) 05/04/2017   Hallux valgus 12/09/2014   Angina pectoris (HCC) 07/31/2014   Other and unspecified ovarian cyst 06/25/2013   Left tennis elbow 06/25/2013   Microscopic hematuria 06/27/2012   Hyperlipidemia 06/14/2012   Seasonal allergies 06/14/2012   Depression 06/14/2012   ADD (attention deficit disorder) 06/14/2012    Past Surgical History:  Procedure Laterality Date   APPENDECTOMY     BICEPT TENODESIS Right 10/10/2019   Procedure: BICEPS TENODESIS;  Surgeon: Micheline Ahr, MD;  Location: [facility];  Service: Orthopedics;  Laterality: Right;   bladder sling/mesh     INGUINAL HERNIA REPAIR     intrauterine ablation     LEFT HEART CATH AND CORONARY ANGIOGRAPHY N/A 12/09/2021   Procedure: LEFT HEART CATH AND CORONARY ANGIOGRAPHY;  Surgeon: Arty Binning, MD;  Location:  [facility];  Service: Cardiovascular;  Laterality: N/A;   NASAL SINUS SURGERY     TUBAL LIGATION     VISCERAL ANGIOGRAPHY N/A 02/04/2022   Procedure: VISCERAL ANGIOGRAPHY/Mesentric;  Surgeon: Young Hensen, MD;  Location: [facility];  Service: Cardiovascular;  Laterality: N/A;    OB History     Gravida  5   Para  4   Term  2   Preterm  2   AB  1   Living  3      SAB      IAB      Ectopic      Multiple      Live Births               Home Medications    Prior to  Admission medications   Medication Sig Start Date End Date Taking? Authorizing Provider  aspirin  EC 81 MG tablet Take 1 tablet (81 mg total) by mouth daily. 01/30/23   Gean Keels, MD  cetirizine  (ZYRTEC ) 10 MG tablet Take 20 mg by mouth daily as needed for allergies ([place]).    [provider]  clonazePAM  (KLONOPIN ) 0.5 MG tablet Take 1 tablet (0.5 mg total) by mouth 2 (two) times daily as needed for anxiety. 09/23/22   Gean Keels, MD  enalapril  (VASOTEC ) 10 MG tablet Take 1 tablet (10 mg total) by mouth 2 (two) times daily. 07/21/23   Gean Keels, MD  EPINEPHrine  0.3 mg/0.3 mL IJ SOAJ injection Inject 0.3 mg into the muscle as needed for anaphylaxis. 09/23/22   Gean Keels, MD  escitalopram  (LEXAPRO ) 20 MG tablet Take 2 tablets (40 mg total) by mouth daily. 11/17/22   Gean Keels, MD  ezetimibe  (ZETIA ) 10 MG tablet Take 1 tablet (10 mg total) by mouth daily. 07/31/23   Gerald Kitty., NP  gabapentin  (NEURONTIN ) 300 MG capsule TAKE 1 CAPSULE(300 MG) BY MOUTH THREE TIMES DAILY 11/02/22   Gean Keels, MD  Hyoscyamine  Sulfate SL (LEVSIN/SL) 0.125 MG SUBL Dissolve one tablet every 4-6 hours as needed for abdominal pain/spasms 03/18/22   Esterwood, Amy S, PA-C  metFORMIN  (GLUCOPHAGE -XR) 750 MG 24 hr tablet Take 1 tablet (750 mg total) by mouth daily with breakfast. 01/30/23   Gean Keels, MD  methylPREDNISolone  (MEDROL  DOSEPAK) 4 MG TBPK tablet tad 07/17/23   Stephany Ehrich, MD  nicotine  (NICODERM CQ  - DOSED IN MG/24 HOURS) 21 mg/24hr patch Place 1 patch (21 mg total) onto the skin daily. 01/31/22   Sonny Dust, MD  nitroGLYCERIN  (NITROSTAT ) 0.4 MG SL tablet Place 1 tablet (0.4 mg total) under the tongue every 5 (five) minutes as needed for chest pain. 12/30/21   Gerald Kitty., NP  ondansetron  (ZOFRAN -ODT) 8 MG disintegrating tablet Take 1 tablet (8 mg total) by mouth every 8 (eight) hours as needed for  nausea. 04/14/23   Gean Keels, MD  rosuvastatin  (CRESTOR ) 40 MG tablet Take 1 tablet (40 mg total) by mouth daily. 06/23/23   Gean Keels, MD  Semaglutide ,0.25 or 0.5MG /DOS, (OZEMPIC , 0.25 OR 0.5 MG/DOSE,) 2 MG/1.5ML SOPN Inject 0.5 mg into the skin once a week. 07/03/23   Gean Keels, MD    Family History Family History  Problem Relation Age of Onset   Depression Mother    Diabetes Father    Hyperlipidemia Father    Hypertension Father    Depression Sister    Breast cancer Maternal  Grandmother    Crohn's disease Maternal Grandmother    High blood pressure Paternal Grandfather    Diabetes Paternal Grandfather    Depression Son    Sleep apnea Neg Hx    Liver disease Neg Hx    Esophageal cancer Neg Hx    Stomach cancer Neg Hx     Social History Social History   Tobacco Use   Smoking status: Every Day    Current packs/day: 1.00    Types: Cigarettes   Smokeless tobacco: Never  Vaping Use   Vaping status: Never Used  Substance Use Topics   Alcohol use: Not Currently   Drug use: No     Allergies   Macrobid [nitrofurantoin], Other, Tetanus toxoids, Thimerosal (thiomersal), and Thimerosal   Review of Systems Review of Systems  See HPI Physical Exam Triage Vital Signs ED Triage Vitals  Encounter Vitals Group     BP 08/10/23 0844 (!) 150/88     Systolic BP Percentile --      Diastolic BP Percentile --      Pulse Rate 08/10/23 0844 75     Resp 08/10/23 0844 16     Temp 08/10/23 0844 98.1 F (36.7 C)     Temp src --      SpO2 08/10/23 0844 94 %     Weight --      Height --      Head Circumference --      Peak Flow --      Pain Score 08/10/23 0848 3     Pain Loc --      Pain Education --      Exclude from Growth Chart --    No data found.  Updated Vital Signs BP (!) 150/88   Pulse 75   Temp 98.1 F (36.7 C)   Resp 16   LMP  (LMP Unknown)   SpO2 94%      Physical Exam Constitutional:      General: She is not in acute  distress.    Appearance: She is well-developed and normal weight.  HENT:     Head: Normocephalic and atraumatic.  Eyes:     Extraocular Movements: Extraocular movements intact.     Conjunctiva/sclera: Conjunctivae normal.     Pupils: Pupils are equal, round, and reactive to light.     Comments: I do not see any changes on funduscopic exam with brief evaluation.  Unable to do thorough eye exam  Cardiovascular:     Rate and Rhythm: Normal rate.  Pulmonary:     Effort: Pulmonary effort is normal. No respiratory distress.  Abdominal:     General: There is no distension.     Palpations: Abdomen is soft.  Musculoskeletal:        General: Normal range of motion.     Cervical back: Normal range of motion.  Skin:    General: Skin is warm and dry.  Neurological:     Mental Status: She is alert.      UC Treatments / Results  Labs (all labs ordered are listed, but only abnormal results are displayed) Labs Reviewed - No data to display  EKG   Radiology No results found.  Procedures Procedures (including critical care time)  Medications Ordered in UC Medications - No data to display  Initial Impression / Assessment and Plan / UC Course  I have reviewed the triage vital signs and the nursing notes.  Pertinent labs & imaging results that were available during my  care of the patient were reviewed by me and considered in my medical decision making (see chart for details).     Discussed with Dr. Sandy Crumb.  This is patient's primary care doctor. He called the patient's usual ophthalmologist who is unavailable and recommends [facility] eye surgeons.  I called the [facility] eye surgeons who recommended retinal specialist.  [facility] in [place], kindly agreed to see patient today at 1230.  Patient agrees to go Final Clinical Impressions(s) / UC Diagnoses   Final diagnoses:  Monocular vision loss   Discharge Instructions      See: [facility] [address] [place] [address] [phone] Dr Armstead Landsberg 12:30 today  ED Prescriptions   None    PDMP not reviewed this encounter.   Stephany Ehrich, MD 08/10/23 [phone]

## 2023-08-10 NOTE — Discharge Instructions (Signed)
 See: [facility] [address] [place] [address] [phone] Dr Armstead Landsberg 12:30 today

## 2023-08-10 NOTE — ED Triage Notes (Addendum)
 Around 10 or 1030 yesterday noticed couldn't see well out of right eye, feels odd walking but not dizzy. Has headache. Had ocular migraine before with similar symptoms. Smile symmetrical, no facial droop. Hand grips equal bil, leg strength equal bil. Gcs 15.

## 2023-08-23 ENCOUNTER — Other Ambulatory Visit: Payer: Self-pay | Admitting: Medical Oncology

## 2023-08-23 DIAGNOSIS — D72829 Elevated white blood cell count, unspecified: Secondary | ICD-10-CM

## 2023-08-24 ENCOUNTER — Other Ambulatory Visit: Payer: Self-pay | Admitting: Medical Genetics

## 2023-08-24 ENCOUNTER — Inpatient Hospital Stay: Payer: Self-pay | Attending: Medical Oncology

## 2023-08-24 ENCOUNTER — Encounter: Payer: Self-pay | Admitting: Medical Oncology

## 2023-08-24 ENCOUNTER — Inpatient Hospital Stay: Payer: 59 | Admitting: Medical Oncology

## 2023-08-24 VITALS — BP 123/72 | HR 61 | Temp 98.4°F | Resp 18 | Ht 66.0 in | Wt 184.0 lb

## 2023-08-24 DIAGNOSIS — R5383 Other fatigue: Secondary | ICD-10-CM | POA: Diagnosis not present

## 2023-08-24 DIAGNOSIS — D72823 Leukemoid reaction: Secondary | ICD-10-CM | POA: Insufficient documentation

## 2023-08-24 DIAGNOSIS — D72829 Elevated white blood cell count, unspecified: Secondary | ICD-10-CM

## 2023-08-24 LAB — CMP (CANCER CENTER ONLY)
ALT: 11 U/L (ref 0–44)
AST: 11 U/L — ABNORMAL LOW (ref 15–41)
Albumin: 4.4 g/dL (ref 3.5–5.0)
Alkaline Phosphatase: 67 U/L (ref 38–126)
Anion gap: 7 (ref 5–15)
BUN: 11 mg/dL (ref 6–20)
CO2: 27 mmol/L (ref 22–32)
Calcium: 9.4 mg/dL (ref 8.9–10.3)
Chloride: 106 mmol/L (ref 98–111)
Creatinine: 1.02 mg/dL — ABNORMAL HIGH (ref 0.44–1.00)
GFR, Estimated: >60 mL/min (ref >60)
Glucose, Bld: 96 mg/dL (ref 70–99)
Potassium: 4.7 mmol/L (ref 3.5–5.1)
Sodium: 140 mmol/L (ref 135–145)
Total Bilirubin: 0.4 mg/dL (ref 0.0–1.2)
Total Protein: 7 g/dL (ref 6.5–8.1)

## 2023-08-24 LAB — CBC WITH DIFFERENTIAL (CANCER CENTER ONLY)
Abs Immature Granulocytes: 0.18 10*3/uL — ABNORMAL HIGH (ref 0.00–0.07)
Basophils Absolute: 0.1 10*3/uL (ref 0.0–0.1)
Basophils Relative: 0 %
Eosinophils Absolute: 0.1 10*3/uL (ref 0.0–0.5)
Eosinophils Relative: 1 %
HCT: 42.6 % (ref 36.0–46.0)
Hemoglobin: 14.1 g/dL (ref 12.0–15.0)
Immature Granulocytes: 1 %
Lymphocytes Relative: 25 %
Lymphs Abs: 3.4 10*3/uL (ref 0.7–4.0)
MCH: 30.2 pg (ref 26.0–34.0)
MCHC: 33.1 g/dL (ref 30.0–36.0)
MCV: 91.2 fL (ref 80.0–100.0)
Monocytes Absolute: 0.9 10*3/uL (ref 0.1–1.0)
Monocytes Relative: 7 %
Neutro Abs: 8.8 10*3/uL — ABNORMAL HIGH (ref 1.7–7.7)
Neutrophils Relative %: 66 %
Platelet Count: 288 10*3/uL (ref 150–400)
RBC: 4.67 MIL/uL (ref 3.87–5.11)
RDW: 13.2 % (ref 11.5–15.5)
WBC Count: 13.5 10*3/uL — ABNORMAL HIGH (ref 4.0–10.5)
nRBC: 0 % (ref 0.0–0.2)

## 2023-08-24 LAB — LACTATE DEHYDROGENASE: LDH: 145 U/L (ref 98–192)

## 2023-08-24 NOTE — Progress Notes (Signed)
 Hematology and Oncology Follow Up Visit  Sarah Nicholson [id number] Dec 17, 1970 53 y.o. 08/24/2023   Principle Diagnosis:  Leukocytosis-leukemoid reaction  Current Therapy:   Observation     Interim History:  [name] is back for follow-up.    Today she states that she has been doing well in general but has noted more fatigue.   She has had no problem with infections.  She has had no rashes.  She has had no change in bowel or bladder habits.  She has had no cough or shortness of breath. No unintentional weight loss (on a weight loss plan).   She also has noticed some night sweats which are new.   She has also noticed increased headaches and visual changes. She has seen UC and a retinal specialist who stated that she needed new glasses.   Mammogram- January 2025  She has had no issues with bleeding.    No recent immunizations, steroid exposures, etc.   No signs of infections.   She does not smoke.    Overall, I would say that her performance status is ECOG 0.   Wt Readings from Last 3 Encounters:  08/24/23 184 lb (83.5 kg)  07/31/23 189 lb (85.7 kg)  07/20/23 188 lb (85.3 kg)     Medications:  Current Outpatient Medications:    aspirin  EC 81 MG tablet, Take 1 tablet (81 mg total) by mouth daily., Disp: 90 tablet, Rfl: 3   cetirizine  (ZYRTEC ) 10 MG tablet, Take 20 mg by mouth daily as needed for allergies ([place])., Disp: , Rfl:    clonazePAM  (KLONOPIN ) 0.5 MG tablet, Take 1 tablet (0.5 mg total) by mouth 2 (two) times daily as needed for anxiety., Disp: 30 tablet, Rfl: 3   enalapril  (VASOTEC ) 10 MG tablet, Take 1 tablet (10 mg total) by mouth 2 (two) times daily., Disp: 90 tablet, Rfl: 3   escitalopram  (LEXAPRO ) 20 MG tablet, Take 2 tablets (40 mg total) by mouth daily., Disp: 180 tablet, Rfl: 3   ezetimibe  (ZETIA ) 10 MG tablet, Take 1 tablet (10 mg total) by mouth daily., Disp: 90 tablet, Rfl: 3   gabapentin  (NEURONTIN ) 300 MG capsule, TAKE 1  CAPSULE(300 MG) BY MOUTH THREE TIMES DAILY, Disp: 270 capsule, Rfl: 3   Hyoscyamine  Sulfate SL (LEVSIN/SL) 0.125 MG SUBL, Dissolve one tablet every 4-6 hours as needed for abdominal pain/spasms, Disp: 30 tablet, Rfl: 4   metFORMIN  (GLUCOPHAGE -XR) 750 MG 24 hr tablet, Take 1 tablet (750 mg total) by mouth daily with breakfast., Disp: 30 tablet, Rfl: 11   ondansetron  (ZOFRAN -ODT) 8 MG disintegrating tablet, Take 1 tablet (8 mg total) by mouth every 8 (eight) hours as needed for nausea., Disp: 20 tablet, Rfl: 3   rosuvastatin  (CRESTOR ) 40 MG tablet, Take 1 tablet (40 mg total) by mouth daily., Disp: 90 tablet, Rfl: 3   Semaglutide ,0.25 or 0.5MG /DOS, (OZEMPIC , 0.25 OR 0.5 MG/DOSE,) 2 MG/1.5ML SOPN, Inject 0.5 mg into the skin once a week., Disp: 3 mL, Rfl: 11   EPINEPHrine  0.3 mg/0.3 mL IJ SOAJ injection, Inject 0.3 mg into the muscle as needed for anaphylaxis. (Patient not taking: Reported on 08/24/2023), Disp: 1 each, Rfl: 1   methylPREDNISolone  (MEDROL  DOSEPAK) 4 MG TBPK tablet, tad, Disp: 21 tablet, Rfl: 0   nicotine  (NICODERM CQ  - DOSED IN MG/24 HOURS) 21 mg/24hr patch, Place 1 patch (21 mg total) onto the skin daily. (Patient not taking: Reported on 08/24/2023), Disp: 28 patch, Rfl: 3   nitroGLYCERIN  (NITROSTAT ) 0.4 MG  SL tablet, Place 1 tablet (0.4 mg total) under the tongue every 5 (five) minutes as needed for chest pain. (Patient not taking: Reported on 08/24/2023), Disp: 25 tablet, Rfl: 3  Allergies:  Allergies  Allergen Reactions   Macrobid [Nitrofurantoin] Hives and Rash   Other Hives    Preservative used in vaccinations   Tetanus Toxoids Swelling    Pt states she was told the Tdap injection would have to be administered in the hospital  After discussion on 11/10 pt remembers being told it was thimersol in the tetanus 30+years ago that caused the swelling.     Thimerosal (Thiomersal) Rash   Thimerosal Rash    Past Medical History, Surgical history, Social history, and Family History  were reviewed and updated.  Review of Systems: Review of Systems  Constitutional: Negative.   HENT:  Negative.    Eyes: Negative.   Respiratory: Negative.    Cardiovascular: Negative.   Gastrointestinal: Negative.   Endocrine: Negative.   Genitourinary: Negative.    Musculoskeletal: Negative.   Skin: Negative.   Neurological: Negative.   Hematological: Negative.   Psychiatric/Behavioral: Negative.      Physical Exam:  height is 5' 6 (1.676 m) and weight is 184 lb (83.5 kg). Her oral temperature is 98.4 F (36.9 C). Her blood pressure is 123/72 and her pulse is 61. Her respiration is 18 and oxygen saturation is 98%.   Wt Readings from Last 3 Encounters:  08/24/23 184 lb (83.5 kg)  07/31/23 189 lb (85.7 kg)  07/20/23 188 lb (85.3 kg)    Physical Exam Vitals reviewed.  HENT:     Head: Normocephalic and atraumatic.   Eyes:     Pupils: Pupils are equal, round, and reactive to light.    Cardiovascular:     Rate and Rhythm: Normal rate and regular rhythm.     Heart sounds: Normal heart sounds.  Pulmonary:     Effort: Pulmonary effort is normal.     Breath sounds: Normal breath sounds.  Abdominal:     General: Bowel sounds are normal.     Palpations: Abdomen is soft.   Musculoskeletal:        General: No tenderness or deformity. Normal range of motion.     Cervical back: Normal range of motion.  Lymphadenopathy:     Cervical: No cervical adenopathy.   Skin:    General: Skin is warm and dry.     Findings: No erythema or rash.   Neurological:     Mental Status: She is alert and oriented to person, place, and time.   Psychiatric:        Behavior: Behavior normal.        Thought Content: Thought content normal.        Judgment: Judgment normal.      Lab Results  Component Value Date   WBC 13.5 (H) 08/24/2023   HGB 14.1 08/24/2023   HCT 42.6 08/24/2023   MCV 91.2 08/24/2023   PLT 288 08/24/2023     Chemistry      Component Value Date/Time   NA 140  08/24/2023 0904   NA 139 06/22/2023 1436   K 4.7 08/24/2023 0904   CL 106 08/24/2023 0904   CO2 27 08/24/2023 0904   BUN 11 08/24/2023 0904   BUN 11 06/22/2023 1436   CREATININE 1.02 (H) 08/24/2023 0904   CREATININE 1.09 (H) 11/03/2021 0000   GLU 78 02/29/2012 0000      Component Value Date/Time  CALCIUM  9.4 08/24/2023 0904   ALKPHOS 67 08/24/2023 0904   AST 11 (L) 08/24/2023 0904   ALT 11 08/24/2023 0904   BILITOT 0.4 08/24/2023 0904     Impression and Plan: [name] is a very charming 53 year old white female.  She has mild leukocytosis secondary to leukemoid reaction confirmed on blood smear.   Initially her WBC count was trending downward which was reassuring. Unfortunately this has trended upwards again. Today WBC is 13.5 with ANC of 8.8 and new immature granulocytes of 0.18. Given her symptoms of increased fatigue, night sweats and these changes we discussed proceeding forward with a BCR-ABL test. Should this be negative, we will move her follow up to every 3 months (from 6) and if labs continue to worsen I would recommend a bone marrow biopsy which we briefly discussed today.   Disposition:  RTC 3 months APP, labs ( CBC w/, CMP, [facility])-[place]   Sharla Davis, PA-C 6/12/20259:50 AM

## 2023-08-28 ENCOUNTER — Other Ambulatory Visit: Payer: Self-pay

## 2023-08-28 DIAGNOSIS — F32A Depression, unspecified: Secondary | ICD-10-CM

## 2023-08-28 DIAGNOSIS — F411 Generalized anxiety disorder: Secondary | ICD-10-CM

## 2023-08-28 MED ORDER — ESCITALOPRAM OXALATE 20 MG PO TABS: 40.0000 mg | ORAL_TABLET | Freq: Every day | ORAL | 1 refills | Status: AC

## 2023-08-29 LAB — BCR-ABL1, CML/ALL, PCR, QUANT
E1A2 Transcript: <0.0032 %
Interpretation (BCRAL):: NEGATIVE
b2a2 transcript: <0.0032 %
b3a2 transcript: <0.0032 %

## 2023-08-30 ENCOUNTER — Ambulatory Visit: Payer: Self-pay | Admitting: Medical Oncology

## 2023-08-31 ENCOUNTER — Ambulatory Visit

## 2023-08-31 ENCOUNTER — Ambulatory Visit (INDEPENDENT_AMBULATORY_CARE_PROVIDER_SITE_OTHER): Admitting: Sports Medicine

## 2023-08-31 DIAGNOSIS — F919 Conduct disorder, unspecified: Secondary | ICD-10-CM | POA: Diagnosis not present

## 2023-08-31 DIAGNOSIS — E236 Other disorders of pituitary gland: Secondary | ICD-10-CM

## 2023-08-31 DIAGNOSIS — M7542 Impingement syndrome of left shoulder: Secondary | ICD-10-CM | POA: Diagnosis not present

## 2023-08-31 NOTE — Assessment & Plan Note (Addendum)
 Sarah Nicholson does have a history of attention deficit disorder as well as depression, she has multiple family members that have been diagnosed on the autism spectrum. She wonders if she may have autism. I am happy to place a referral to one of our autism therapists as I do not have expertise in this area. Her main goal is to determine if she does meet any clinical criteria for ASD.

## 2023-08-31 NOTE — Progress Notes (Signed)
    Procedures performed today:    None.  Independent interpretation of notes and tests performed by another provider:   None.  Brief History, Exam, Impression, and Recommendations:    Impingement syndrome, shoulder, left Several weeks of increasing discomfort left shoulder over the deltoid worse with abduction, positive impingement signs on exam but good strength, discussed the pathophysiology of shoulder impingement syndrome, adding x-rays, formal PT, she can use ibuprofen  at home. 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  Behavior disturbance Sarah Nicholson does have a history of attention deficit disorder as well as depression, she has multiple family members that have been diagnosed on the autism spectrum. She wonders if she may have autism. I am happy to place a referral to one of our autism therapists as I do not have expertise in this area. Her main goal is to determine if she does meet any clinical criteria for ASD.    ____________________________________________ Joselyn Nicely. Sandy Crumb, M.D., ABFM., CAQSM., AME. Primary Care and Sports Medicine [facility]  Adjunct Professor of [employer] Medicine  [employer]  Restaurant manager, fast food

## 2023-08-31 NOTE — Assessment & Plan Note (Signed)
 Several weeks of increasing discomfort left shoulder over the deltoid worse with abduction, positive impingement signs on exam but good strength, discussed the pathophysiology of shoulder impingement syndrome, adding x-rays, formal PT, she can use ibuprofen  at home. Return in 6 weeks, subacromial injection if not better

## 2023-08-31 NOTE — Assessment & Plan Note (Signed)
 Incidentally noted on brain MRI 2 years ago, needs single additional follow-up MRI.

## 2023-09-01 ENCOUNTER — Other Ambulatory Visit (HOSPITAL_COMMUNITY)
Admission: RE | Admit: 2023-09-01 | Discharge: 2023-09-01 | Disposition: A | Discharge status: Home / Self Care | Payer: Self-pay | Source: Ambulatory Visit | Attending: Medical Genetics | Admitting: Medical Genetics

## 2023-09-11 ENCOUNTER — Ambulatory Visit

## 2023-09-11 DIAGNOSIS — E236 Other disorders of pituitary gland: Secondary | ICD-10-CM | POA: Diagnosis not present

## 2023-09-11 MED ORDER — GADOBUTROL 1 MMOL/ML IV SOLN
7.5000 mL | Freq: Once | INTRAVENOUS | Status: AC | PRN
  Administered 2023-09-11: 7.5 mL via INTRAVENOUS

## 2023-09-15 LAB — GENECONNECT MOLECULAR SCREEN: Genetic Analysis Overall Interpretation: NEGATIVE

## 2023-09-18 ENCOUNTER — Ambulatory Visit: Payer: Self-pay | Admitting: Sports Medicine

## 2023-10-05 ENCOUNTER — Ambulatory Visit
Admission: EM | Admit: 2023-10-05 | Discharge: 2023-10-05 | Disposition: A | Discharge status: Home / Self Care | Attending: Family Medicine | Admitting: Family Medicine

## 2023-10-05 ENCOUNTER — Encounter: Payer: Self-pay | Admitting: Emergency Medicine

## 2023-10-05 DIAGNOSIS — J320 Chronic maxillary sinusitis: Secondary | ICD-10-CM | POA: Diagnosis not present

## 2023-10-05 DIAGNOSIS — J3489 Other specified disorders of nose and nasal sinuses: Secondary | ICD-10-CM | POA: Diagnosis not present

## 2023-10-05 MED ORDER — DOXYCYCLINE HYCLATE 100 MG PO CAPS: 100.0000 mg | ORAL_CAPSULE | Freq: Two times a day (BID) | ORAL | 0 refills | Status: AC

## 2023-10-05 MED ORDER — FLUTICASONE PROPIONATE 50 MCG/ACT NA SUSP: 1.0000 | Freq: Two times a day (BID) | NASAL | 0 refills | Status: AC | PRN

## 2023-10-05 NOTE — ED Triage Notes (Signed)
 Patient c/o possible sinus infection, facial pain, pressure and pain at the roof of her mouth x 4 days.  Patient has not taken anything OTC due to HBP.

## 2023-10-05 NOTE — ED Provider Notes (Signed)
 Sarah Nicholson    CSN: [id number] Arrival date & time: 10/05/23  1446      History   Chief Complaint Chief Complaint  Patient presents with   Sinus Pressure    HPI Sarah Nicholson is a 53 y.o. female.   Patient reports runny nose, nasal congestion, sinus pressure and pain with some beginning to be yellow-greenish nasal discharge.  Symptoms have persisted since 10/01/2023 or earlier.  She is starting to have pain in the roof of her mouth and a lot of facial pressure.     Past Medical History:  Diagnosis Date   ADD (attention deficit disorder) 06/14/2012   Anxiety    Bronchitis    Complication of anesthesia 1987   states woke up during appedentomy   Depression 06/14/2012   Hepatic steatosis 11/13/2018   Hepatic steatosis with hepatomegaly seen on MRI abd on 11/2018   History of leukocytosis 06/14/2012   Reports WBC 32 decades ago with negative hematology specialist workup.    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   Abnormal stress test 12/09/2021   Hemiparesis, left (HCC) 11/03/2021   Herpes zoster 08/06/2021   Femoral hernia 03/04/2021   Leukocytosis 01/16/2021   Left lateral abdominal pain 01/15/2021   Lumbar spondylosis 01/15/2021   Trochanteric bursitis and hip joint arthritis, left 12/04/2020   Hepatic steatosis  11/13/2018   Tobacco use 07/18/2018   Irritable bowel syndrome (IBS) 05/04/2017   Hallux valgus 12/09/2014   Angina pectoris (HCC) 07/31/2014   Other and unspecified ovarian cyst 06/25/2013   Left tennis elbow 06/25/2013   Microscopic hematuria 06/27/2012   Hyperlipidemia 06/14/2012   Seasonal allergies 06/14/2012   Depression 06/14/2012   ADD (attention deficit disorder) 06/14/2012    Past Surgical History:  Procedure Laterality Date   APPENDECTOMY     BICEPT TENODESIS Right 10/10/2019   Procedure: BICEPS TENODESIS;  Surgeon: Cristy Bonner DASEN, MD;  Location: [facility];  Service: Orthopedics;  Laterality: Right;   bladder sling/mesh     INGUINAL HERNIA REPAIR     intrauterine ablation     LEFT HEART CATH AND CORONARY ANGIOGRAPHY N/A 12/09/2021   Procedure: LEFT HEART CATH AND CORONARY ANGIOGRAPHY;  Surgeon: Claudene Victory ORN, MD;  Location: [facility];  Service: Cardiovascular;  Laterality: N/A;   NASAL SINUS SURGERY     TUBAL LIGATION     VISCERAL ANGIOGRAPHY N/A 02/04/2022   Procedure: VISCERAL ANGIOGRAPHY/Mesentric;  Surgeon: Gretta Lonni PARAS, MD;  Location: [facility];  Service: Cardiovascular;  Laterality: N/A;    OB History     Gravida  5   Para  4   Term  2   Preterm  2   AB  1   Living  3      SAB      IAB      Ectopic  Multiple      Live Births               Home Medications    Prior to Admission medications   Medication Sig Start Date End Date Taking? Authorizing Provider  aspirin  EC 81 MG tablet Take 1 tablet (81 mg total) by mouth daily. 01/30/23  Yes Curtis Debby PARAS, MD  cetirizine  (ZYRTEC ) 10 MG tablet Take 20 mg by mouth daily as needed for allergies ([place]).   Yes [provider]  clonazePAM  (KLONOPIN ) 0.5 MG tablet Take 1 tablet (0.5 mg total) by mouth 2 (two) times daily as needed for anxiety. 09/23/22  Yes Curtis Debby PARAS, MD  doxycycline  (VIBRAMYCIN ) 100 MG capsule Take 1  capsule (100 mg total) by mouth 2 (two) times daily for 7 days. 10/05/23 10/12/23 Yes Ival Domino, FNP  enalapril  (VASOTEC ) 10 MG tablet Take 1 tablet (10 mg total) by mouth 2 (two) times daily. 07/21/23  Yes Curtis Debby PARAS, MD  escitalopram  (LEXAPRO ) 20 MG tablet Take 2 tablets (40 mg total) by mouth daily. 08/28/23  Yes Curtis Debby PARAS, MD  ezetimibe  (ZETIA ) 10 MG tablet Take 1 tablet (10 mg total) by mouth daily. 07/31/23  Yes Wyn Jackee VEAR Mickey., NP  fluticasone  (FLONASE ) 50 MCG/ACT nasal spray Place 1 spray into both nostrils 2 (two) times daily as needed for rhinitis. 10/05/23 11/04/23 Yes Ival Domino, FNP  gabapentin  (NEURONTIN ) 300 MG capsule TAKE 1 CAPSULE(300 MG) BY MOUTH THREE TIMES DAILY 11/02/22  Yes Curtis Debby PARAS, MD  Hyoscyamine  Sulfate SL (LEVSIN AMIEL) 0.125 MG SUBL Dissolve one tablet every 4-6 hours as needed for abdominal pain/spasms 03/18/22  Yes Esterwood, Amy S, PA-C  metFORMIN  (GLUCOPHAGE -XR) 750 MG 24 hr tablet Take 1 tablet (750 mg total) by mouth daily with breakfast. 01/30/23  Yes Curtis Debby PARAS, MD  ondansetron  (ZOFRAN -ODT) 8 MG disintegrating tablet Take 1 tablet (8 mg total) by mouth every 8 (eight) hours as needed for nausea. 04/14/23  Yes Curtis Debby PARAS, MD  rosuvastatin  (CRESTOR ) 40 MG tablet Take 1 tablet (40 mg total) by mouth daily. 06/23/23  Yes Curtis Debby PARAS, MD  Semaglutide ,0.25 or 0.5MG /DOS, (OZEMPIC , 0.25 OR 0.5 MG/DOSE,) 2 MG/1.5ML SOPN Inject 0.5 mg into the skin once a week. 07/03/23  Yes Curtis Debby PARAS, MD    Family History Family History  Problem Relation Age of Onset   Depression Mother    Diabetes Father    Hyperlipidemia Father    Hypertension Father    Depression Sister    Breast cancer Maternal Grandmother    Crohn's disease Maternal Grandmother    High blood pressure Paternal Grandfather    Diabetes Paternal Grandfather    Depression Son    Sleep apnea Neg Hx    Liver disease Neg Hx     Esophageal cancer Neg Hx    Stomach cancer Neg Hx     Social History Social History   Tobacco Use   Smoking status: Every Day    Current packs/day: 1.00    Types: Cigarettes   Smokeless tobacco: Never  Vaping Use   Vaping status: Never Used  Substance Use Topics   Alcohol use: Not Currently   Drug use: No     Allergies   Macrobid [nitrofurantoin], Other, Tetanus toxoids, Thimerosal (thiomersal), and Thimerosal   Review of Systems Review of Systems  Constitutional:  Negative for chills and fever.  HENT:  Positive for congestion, postnasal drip, rhinorrhea, sinus pressure and sinus pain. Negative for  ear pain and sore throat.   Eyes:  Negative for pain and visual disturbance.  Respiratory:  Negative for cough and shortness of breath.   Cardiovascular:  Negative for chest pain and palpitations.  Gastrointestinal:  Negative for abdominal pain, constipation, diarrhea, nausea and vomiting.  Genitourinary:  Negative for dysuria and hematuria.  Musculoskeletal:  Negative for arthralgias and back pain.  Skin:  Negative for color change and rash.  Neurological:  Negative for seizures and syncope.  All other systems reviewed and are negative.    Physical Exam Triage Vital Signs ED Triage Vitals  Encounter Vitals Group     BP 10/05/23 1504 138/84     Girls Systolic BP Percentile --      Girls Diastolic BP Percentile --      Boys Systolic BP Percentile --      Boys Diastolic BP Percentile --      Pulse Rate 10/05/23 1504 78     Resp 10/05/23 1504 18     Temp 10/05/23 1504 99.2 F (37.3 C)     Temp Source 10/05/23 1504 Oral     SpO2 10/05/23 1504 94 %     Weight 10/05/23 1506 185 lb (83.9 kg)     Height 10/05/23 1506 5' 6 (1.676 m)     Head Circumference --      Peak Flow --      Pain Score 10/05/23 1506 5     Pain Loc --      Pain Education --      Exclude from Growth Chart --    No data found.  Updated Vital Signs BP 138/84 (BP Location: Right Arm)   Pulse 78    Temp 99.2 F (37.3 C) (Oral)   Resp 18   Ht 5' 6 (1.676 m)   Wt 185 lb (83.9 kg)   LMP  (LMP Unknown)   SpO2 94%   BMI 29.86 kg/m   Visual Acuity Right Eye Distance:   Left Eye Distance:   Bilateral Distance:    Right Eye Near:   Left Eye Near:    Bilateral Near:     Physical Exam Vitals and nursing note reviewed.  Constitutional:      General: She is not in acute distress.    Appearance: She is well-developed. She is not ill-appearing or toxic-appearing.  HENT:     Head: Normocephalic and atraumatic.     Right Ear: Hearing, tympanic membrane, ear canal and external ear normal.     Left Ear: Hearing, tympanic membrane, ear canal and external ear normal.     Nose: Congestion and rhinorrhea present. Rhinorrhea is purulent.     Right Sinus: Maxillary sinus tenderness present. No frontal sinus tenderness.     Left Sinus: Maxillary sinus tenderness present. No frontal sinus tenderness.     Mouth/Throat:     Lips: Pink.     Mouth: Mucous membranes are moist.     Pharynx: Uvula midline. No oropharyngeal exudate or posterior oropharyngeal erythema.     Tonsils: No tonsillar exudate.  Eyes:     Conjunctiva/sclera: Conjunctivae normal.     Pupils: Pupils are equal, round, and reactive to light.  Cardiovascular:     Rate and Rhythm: Normal rate and regular rhythm.     Heart sounds: S1 normal and S2 normal. No murmur heard. Pulmonary:     Effort: Pulmonary effort is normal. No respiratory distress.     Breath sounds: Normal breath sounds. No decreased breath sounds,  wheezing, rhonchi or rales.  Abdominal:     General: Bowel sounds are normal.     Palpations: Abdomen is soft.     Tenderness: There is no abdominal tenderness.  Musculoskeletal:        General: No swelling.     Cervical back: Neck supple.  Lymphadenopathy:     Head:     Right side of head: No submental, submandibular, tonsillar, preauricular or posterior auricular adenopathy.     Left side of head: No  submental, submandibular, tonsillar, preauricular or posterior auricular adenopathy.     Cervical: Cervical adenopathy present.     Right cervical: Superficial cervical adenopathy present.     Left cervical: Superficial cervical adenopathy present.  Skin:    General: Skin is warm and dry.     Capillary Refill: Capillary refill takes less than 2 seconds.     Findings: No rash.  Neurological:     Mental Status: She is alert and oriented to person, place, and time.  Psychiatric:        Mood and Affect: Mood normal.      UC Treatments / Results  Labs (all labs ordered are listed, but only abnormal results are displayed) Labs Reviewed - No data to display  EKG   Radiology No results found.  Procedures Procedures (including critical Nicholson time)  Medications Ordered in UC Medications - No data to display  Initial Impression / Assessment and Plan / UC Course  I have reviewed the triage vital signs and the nursing notes.  Pertinent labs & imaging results that were available during my Nicholson of the patient were reviewed by me and considered in my medical decision making (see chart for details).  Plan of Nicholson: Sinusitis and sinus pressure: Continue sinus rinses twice a day.  Fluticasone  nasal spray, 1 spray to each nostril twice daily as needed for nasal congestion and runny nose.  Doxycycline  100 mg twice daily for 7 days.  Get plenty of fluids and rest.  Follow-up if symptoms do not improve, worsen or new symptoms occur.  I reviewed the plan of Nicholson with the patient and/or the patient's guardian.  The patient and/or guardian had time to ask questions and acknowledged that the questions were answered.  I provided instruction on symptoms or reasons to return here or to go to an ER, if symptoms/condition did not improve, worsened or if new symptoms occurred.  Final Clinical Impressions(s) / UC Diagnoses   Final diagnoses:  Chronic maxillary sinusitis  Sinus pressure     Discharge  Instructions      Sinusitis and sinus pressure: Continue using the Nettie pot or sinus rinses twice daily.  Fluticasone  nasal spray, 1 spray into each nostril twice daily if needed for nasal congestion or runny nose.  Doxycycline  100 mg twice daily for 7 days.  Get plenty of fluids and rest.  Follow-up if symptoms do not improve, worsen or new symptoms occur.     ED Prescriptions     Medication Sig Dispense Auth. Provider   fluticasone  (FLONASE ) 50 MCG/ACT nasal spray Place 1 spray into both nostrils 2 (two) times daily as needed for rhinitis. 17 mL Ival Domino, FNP   doxycycline  (VIBRAMYCIN ) 100 MG capsule Take 1 capsule (100 mg total) by mouth 2 (two) times daily for 7 days. 14 capsule Ival Domino, FNP      PDMP not reviewed this encounter.   Ival Domino, FNP 10/05/23 1531

## 2023-10-05 NOTE — Discharge Instructions (Addendum)
 Sinusitis and sinus pressure: Continue using the Nettie pot or sinus rinses twice daily.  Fluticasone  nasal spray, 1 spray into each nostril twice daily if needed for nasal congestion or runny nose.  Doxycycline  100 mg twice daily for 7 days.  Get plenty of fluids and rest.  Follow-up if symptoms do not improve, worsen or new symptoms occur.

## 2023-10-12 ENCOUNTER — Ambulatory Visit (INDEPENDENT_AMBULATORY_CARE_PROVIDER_SITE_OTHER): Admitting: Sports Medicine

## 2023-10-12 DIAGNOSIS — M7542 Impingement syndrome of left shoulder: Secondary | ICD-10-CM | POA: Diagnosis not present

## 2023-10-12 DIAGNOSIS — M7062 Trochanteric bursitis, left hip: Secondary | ICD-10-CM | POA: Diagnosis not present

## 2023-10-12 NOTE — Assessment & Plan Note (Signed)
 This pleasant 61 a female with impingement syndrome left shoulder comes back. She is about 50% better, x-rays did show some acromioclavicular spurring. At this point she continues to improve, she will continue her home conditioning, and we can defer any injection for now.

## 2023-10-12 NOTE — Progress Notes (Signed)
    Procedures performed today:    None.  Independent interpretation of notes and tests performed by another provider:   None.  Brief History, Exam, Impression, and Recommendations:    Impingement syndrome, shoulder, left This pleasant 57 a female with impingement syndrome left shoulder comes back. She is about 50% better, x-rays did show some acromioclavicular spurring. At this point she continues to improve, she will continue her home conditioning, and we can defer any injection for now.  Trochanteric bursitis and hip joint arthritis, left Renee's last hip injection was in February, she continues to do well, she thinks the weight loss has certainly helped, I would agree. She will add her hip girdle conditioning to the shoulder conditioning and return as needed.    ____________________________________________ Debby PARAS. Curtis, M.D., ABFM., CAQSM., AME. Primary Care and Sports Medicine [facility]  Adjunct Professor of [facility] [employer] Medicine  [employer]  Restaurant manager, fast food

## 2023-10-12 NOTE — Assessment & Plan Note (Signed)
 Vestal's last hip injection was in February, she continues to do well, she thinks the weight loss has certainly helped, I would agree. She will add her hip girdle conditioning to the shoulder conditioning and return as needed.

## 2023-11-01 ENCOUNTER — Other Ambulatory Visit: Payer: Self-pay

## 2023-11-01 MED ORDER — ENALAPRIL MALEATE 10 MG PO TABS: 10.0000 mg | ORAL_TABLET | Freq: Two times a day (BID) | ORAL | 3 refills | Status: AC

## 2023-11-14 ENCOUNTER — Encounter: Payer: Self-pay | Admitting: Sports Medicine

## 2023-11-21 ENCOUNTER — Other Ambulatory Visit (HOSPITAL_BASED_OUTPATIENT_CLINIC_OR_DEPARTMENT_OTHER): Payer: Self-pay

## 2023-11-21 MED ORDER — GABAPENTIN 300 MG PO CAPS: 300.0000 mg | ORAL_CAPSULE | Freq: Three times a day (TID) | ORAL | 1 refills | Status: DC

## 2023-11-23 ENCOUNTER — Inpatient Hospital Stay: Attending: Medical Oncology

## 2023-11-23 ENCOUNTER — Inpatient Hospital Stay (HOSPITAL_BASED_OUTPATIENT_CLINIC_OR_DEPARTMENT_OTHER): Admitting: Medical Oncology

## 2023-11-23 ENCOUNTER — Ambulatory Visit: Admitting: Sports Medicine

## 2023-11-23 VITALS — BP 123/79 | HR 62 | Temp 98.7°F | Resp 18 | Ht 66.0 in | Wt 191.0 lb

## 2023-11-23 DIAGNOSIS — D72823 Leukemoid reaction: Secondary | ICD-10-CM

## 2023-11-23 DIAGNOSIS — D72829 Elevated white blood cell count, unspecified: Secondary | ICD-10-CM

## 2023-11-23 LAB — CMP (CANCER CENTER ONLY)
ALT: 13 U/L (ref 0–44)
AST: 14 U/L — ABNORMAL LOW (ref 15–41)
Albumin: 4.1 g/dL (ref 3.5–5.0)
Alkaline Phosphatase: 90 U/L (ref 38–126)
Anion gap: 11 (ref 5–15)
BUN: 14 mg/dL (ref 6–20)
CO2: 23 mmol/L (ref 22–32)
Calcium: 9.3 mg/dL (ref 8.9–10.3)
Chloride: 104 mmol/L (ref 98–111)
Creatinine: 1 mg/dL (ref 0.44–1.00)
GFR, Estimated: >60 mL/min (ref >60)
Glucose, Bld: 97 mg/dL (ref 70–99)
Potassium: 4.2 mmol/L (ref 3.5–5.1)
Sodium: 138 mmol/L (ref 135–145)
Total Bilirubin: 0.2 mg/dL (ref 0.0–1.2)
Total Protein: 7 g/dL (ref 6.5–8.1)

## 2023-11-23 LAB — CBC WITH DIFFERENTIAL (CANCER CENTER ONLY)
Abs Immature Granulocytes: 0.05 K/uL (ref 0.00–0.07)
Basophils Absolute: 0.1 K/uL (ref 0.0–0.1)
Basophils Relative: 0 %
Eosinophils Absolute: 0.1 K/uL (ref 0.0–0.5)
Eosinophils Relative: 1 %
HCT: 44 % (ref 36.0–46.0)
Hemoglobin: 14.4 g/dL (ref 12.0–15.0)
Immature Granulocytes: 0 %
Lymphocytes Relative: 25 %
Lymphs Abs: 2.9 K/uL (ref 0.7–4.0)
MCH: 30.2 pg (ref 26.0–34.0)
MCHC: 32.7 g/dL (ref 30.0–36.0)
MCV: 92.2 fL (ref 80.0–100.0)
Monocytes Absolute: 0.8 K/uL (ref 0.1–1.0)
Monocytes Relative: 7 %
Neutro Abs: 7.5 K/uL (ref 1.7–7.7)
Neutrophils Relative %: 67 %
Platelet Count: 294 K/uL (ref 150–400)
RBC: 4.77 MIL/uL (ref 3.87–5.11)
RDW: 12.6 % (ref 11.5–15.5)
WBC Count: 11.5 K/uL — ABNORMAL HIGH (ref 4.0–10.5)
nRBC: 0 % (ref 0.0–0.2)

## 2023-11-23 LAB — LACTATE DEHYDROGENASE: LDH: 187 U/L (ref 98–192)

## 2023-11-23 NOTE — Progress Notes (Signed)
 Hematology and Oncology Follow Up Visit  Uzma Hellmer [id number] 27-Oct-1970 53 y.o. 11/23/2023   Principle Diagnosis:  Leukocytosis-leukemoid reaction  Current Therapy:   Observation     Interim History:  Ms. Mialani Reicks is back for follow-up.    Today she states that she has been well other than getting treated for ringworm which she suspects she got from a recent cruise.   She has had no problem with infections.  She has had no rashes.  She has had no change in bowel or bladder habits.  She has had no cough or shortness of breath. No unintentional weight loss (on a weight loss plan).   Night sweats are stable- slightly worse off of gabapentin (restarting)  She has also noticed increased headaches and visual changes. She has seen UC and a retinal specialist who stated that she needed new glasses.   Mammogram- January 2025  She has had no issues with bleeding.    No recent immunizations, steroid exposures, etc.   No signs of infections.   She does not smoke.   Overall, I would say that her performance status is ECOG 0.   Wt Readings from Last 3 Encounters:  11/23/23 191 lb (86.6 kg)  10/05/23 185 lb (83.9 kg)  08/24/23 184 lb (83.5 kg)    Medications:  Current Outpatient Medications:    aspirin  EC 81 MG tablet, Take 1 tablet (81 mg total) by mouth daily., Disp: 90 tablet, Rfl: 3   cetirizine  (ZYRTEC ) 10 MG tablet, Take 20 mg by mouth daily as needed for allergies ([place])., Disp: , Rfl:    clonazePAM  (KLONOPIN ) 0.5 MG tablet, Take 1 tablet (0.5 mg total) by mouth 2 (two) times daily as needed for anxiety., Disp: 30 tablet, Rfl: 3   enalapril  (VASOTEC ) 10 MG tablet, Take 1 tablet (10 mg total) by mouth 2 (two) times daily., Disp: 90 tablet, Rfl: 3   escitalopram  (LEXAPRO ) 20 MG tablet, Take 2 tablets (40 mg total) by mouth daily., Disp: 180 tablet, Rfl: 1   ezetimibe  (ZETIA ) 10 MG tablet, Take 1 tablet (10 mg total) by mouth daily., Disp: 90 tablet, Rfl: 3    fluticasone  (FLONASE ) 50 MCG/ACT nasal spray, Place 1 spray into both nostrils 2 (two) times daily as needed for rhinitis., Disp: 17 mL, Rfl: 0   gabapentin  (NEURONTIN ) 300 MG capsule, Take 1 capsule (300 mg total) by mouth 3 (three) times daily., Disp: 270 capsule, Rfl: 1   Hyoscyamine  Sulfate SL (LEVSIN /SL) 0.125 MG SUBL, Dissolve one tablet every 4-6 hours as needed for abdominal pain/spasms, Disp: 30 tablet, Rfl: 4   metFORMIN  (GLUCOPHAGE -XR) 750 MG 24 hr tablet, Take 1 tablet (750 mg total) by mouth daily with breakfast., Disp: 30 tablet, Rfl: 11   ondansetron  (ZOFRAN -ODT) 8 MG disintegrating tablet, Take 1 tablet (8 mg total) by mouth every 8 (eight) hours as needed for nausea., Disp: 20 tablet, Rfl: 3   rosuvastatin  (CRESTOR ) 40 MG tablet, Take 1 tablet (40 mg total) by mouth daily., Disp: 90 tablet, Rfl: 3   Semaglutide ,0.25 or 0.5MG /DOS, (OZEMPIC , 0.25 OR 0.5 MG/DOSE,) 2 MG/1.5ML SOPN, Inject 0.5 mg into the skin once a week., Disp: 3 mL, Rfl: 11  Allergies:  Allergies  Allergen Reactions   Macrobid [Nitrofurantoin] Hives and Rash   Other Hives    Preservative used in vaccinations   Tetanus Toxoid-Containing Vaccines Swelling    Pt states she was told the Tdap injection would have to be administered in the hospital  After  discussion on 11/10 pt remembers being told it was thimersol in the tetanus 30+years ago that caused the swelling.     Thimerosal (Thiomersal) Rash   Thimerosal Rash    Past Medical History, Surgical history, Social history, and Family History were reviewed and updated.  Review of Systems: Review of Systems  Constitutional: Negative.   HENT:  Negative.    Eyes: Negative.   Respiratory: Negative.    Cardiovascular: Negative.   Gastrointestinal: Negative.   Endocrine: Negative.   Genitourinary: Negative.    Musculoskeletal: Negative.   Skin: Negative.   Neurological: Negative.   Hematological: Negative.   Psychiatric/Behavioral: Negative.       Physical Exam:  height is 5' 6 (1.676 m) and weight is 191 lb (86.6 kg). Her oral temperature is 98.7 F (37.1 C). Her blood pressure is 123/79 and her pulse is 62. Her respiration is 18 and oxygen saturation is 99%.   Wt Readings from Last 3 Encounters:  11/23/23 191 lb (86.6 kg)  10/05/23 185 lb (83.9 kg)  08/24/23 184 lb (83.5 kg)    Physical Exam Vitals reviewed.  HENT:     Head: Normocephalic and atraumatic.  Eyes:     Pupils: Pupils are equal, round, and reactive to light.  Cardiovascular:     Rate and Rhythm: Normal rate and regular rhythm.     Heart sounds: Normal heart sounds.  Pulmonary:     Effort: Pulmonary effort is normal.     Breath sounds: Normal breath sounds.  Abdominal:     General: Bowel sounds are normal.     Palpations: Abdomen is soft.  Musculoskeletal:        General: No tenderness or deformity. Normal range of motion.     Cervical back: Normal range of motion.  Lymphadenopathy:     Cervical: No cervical adenopathy.  Skin:    General: Skin is warm and dry.     Findings: No erythema or rash.  Neurological:     Mental Status: She is alert and oriented to person, place, and time.  Psychiatric:        Behavior: Behavior normal.        Thought Content: Thought content normal.        Judgment: Judgment normal.      Lab Results  Component Value Date   WBC 11.5 (H) 11/23/2023   HGB 14.4 11/23/2023   HCT 44.0 11/23/2023   MCV 92.2 11/23/2023   PLT 294 11/23/2023     Chemistry      Component Value Date/Time   NA 140 08/24/2023 0904   NA 139 06/22/2023 1436   K 4.7 08/24/2023 0904   CL 106 08/24/2023 0904   CO2 27 08/24/2023 0904   BUN 11 08/24/2023 0904   BUN 11 06/22/2023 1436   CREATININE 1.02 (H) 08/24/2023 0904   CREATININE 1.09 (H) 11/03/2021 0000   GLU 78 02/29/2012 0000      Component Value Date/Time   CALCIUM  9.4 08/24/2023 0904   ALKPHOS 67 08/24/2023 0904   AST 11 (L) 08/24/2023 0904   ALT 11 08/24/2023 0904    BILITOT 0.4 08/24/2023 0904     Encounter Diagnosis  Name Primary?   Leukemoid reaction Yes    Impression and Plan: Ms. Jeanet Lupe is a very charming 53 y.o. white female.  She has mild leukocytosis secondary to leukemoid reaction confirmed on blood smear. BCR-ABL normal on 08/24/2023  Initially her WBC count was trending downward which was reassuring. Unfortunately  her counts then began to trend upwards. At her last visit her WBC was 13.5 with ANC of 8.8 and new immature granulocytes of 0.18.   Today her WBC is down to 11.5 with no other abnormalities. This is very reassuring. We will continue to monitor every three months and if labs/symptoms were to worsen I would recommend a bone marrow biopsy .   She reports being UTD on mammograms and colon cancer screenings.   Disposition:  RTC 3 months APP, labs ( CBC w/, CMP, [facility])-[place]   Lauraine CHRISTELLA Dais, PA-C 9/11/20259:36 AM

## 2024-01-04 ENCOUNTER — Ambulatory Visit (INDEPENDENT_AMBULATORY_CARE_PROVIDER_SITE_OTHER)

## 2024-01-04 ENCOUNTER — Ambulatory Visit: Admitting: Family Medicine

## 2024-01-04 VITALS — BP 120/76 | HR 73 | Ht 66.0 in | Wt 195.0 lb

## 2024-01-04 DIAGNOSIS — M25532 Pain in left wrist: Secondary | ICD-10-CM | POA: Diagnosis not present

## 2024-01-04 NOTE — Assessment & Plan Note (Addendum)
 Xrays L wrist.  ?TFCC tear  If negative xrays. Discussed MRI vs referral to Ortho/sports med.

## 2024-01-04 NOTE — Progress Notes (Signed)
 Sarah Nicholson - 53 y.o. female MRN [id number]  Date of birth: 06-25-1970  Subjective Chief Complaint  Patient presents with   Wrist Injury    HPI Sarah Nicholson is a 53 y.o. female here today with complaint of L wrist pain.  She fell with her hand in a flexed position at the end of August.  Put off being seen in hopes that this would get better on it's own.  Unfortunately, she continues to have pain.  She has pain in all planes of movement. She has pain with grip.  She denies grinding or popping sensation.    Some swelling initially but this has resolved.  She has tried ibuprofen  and wrapping the wrist without much improvement.   ROS:  A comprehensive ROS was completed and negative except as noted per HPI  Allergies  Allergen Reactions   Macrobid [Nitrofurantoin] Hives and Rash   Other Hives    Preservative used in vaccinations   Tetanus Toxoid-Containing Vaccines Swelling    Pt states she was told the Tdap injection would have to be administered in the hospital  After discussion on 11/10 pt remembers being told it was thimersol in the tetanus 30+years ago that caused the swelling.     Thimerosal (Thiomersal) Rash   Thimerosal Rash    Past Medical History:  Diagnosis Date   ADD (attention deficit disorder) 06/14/2012   Anxiety    Bronchitis    Complication of anesthesia 1987   states woke up during appedentomy   Depression 06/14/2012   Hepatic steatosis 11/13/2018   Hepatic steatosis with hepatomegaly seen on MRI abd on 11/2018   History of leukocytosis 06/14/2012   Reports WBC 32 decades ago with negative hematology specialist workup.    Hyperlipidemia 06/14/2012   Hypertension    Seasonal allergies 06/14/2012    Past Surgical History:  Procedure Laterality Date   APPENDECTOMY     BICEPT TENODESIS Right 10/10/2019   Procedure: BICEPS TENODESIS;  Surgeon: Cristy Bonner DASEN, MD;  Location: [facility];  Service: Orthopedics;  Laterality: Right;    bladder sling/mesh     INGUINAL HERNIA REPAIR     intrauterine ablation     LEFT HEART CATH AND CORONARY ANGIOGRAPHY N/A 12/09/2021   Procedure: LEFT HEART CATH AND CORONARY ANGIOGRAPHY;  Surgeon: Claudene Victory ORN, MD;  Location: [facility];  Service: Cardiovascular;  Laterality: N/A;   NASAL SINUS SURGERY     TUBAL LIGATION     VISCERAL ANGIOGRAPHY N/A 02/04/2022   Procedure: VISCERAL ANGIOGRAPHY/Mesentric;  Surgeon: Gretta Lonni PARAS, MD;  Location: [facility];  Service: Cardiovascular;  Laterality: N/A;    Social History   Socioeconomic History   Marital status: Married    Spouse name: Not on file   Number of children: 3   Years of education: Not on file   Highest education level: Bachelor's degree (e.g., BA, AB, BS)  Occupational History   Occupation: [employer]  Tobacco Use   Smoking status: Every Day    Current packs/day: 1.00    Types: Cigarettes   Smokeless tobacco: Never  Vaping Use   Vaping status: Never Used  Substance and Sexual Activity   Alcohol use: Not Currently   Drug use: No   Sexual activity: Yes    Birth control/protection: Post-menopausal  Other Topics Concern   Not on file  Social History Narrative   Not on file   Social Drivers of Corporate investment banker  Strain: Low Risk  (01/04/2024)   Overall Financial Resource Strain (CARDIA)    Difficulty of Paying Living Expenses: Not hard at all  Food Insecurity: No Food Insecurity (01/04/2024)   Hunger Vital Sign    Worried About Running Out of Food in the Last Year: Never true    Ran Out of Food in the Last Year: Never true  Transportation Needs: No Transportation Needs (01/04/2024)   PRAPARE - Administrator, Civil Service (Medical): No    Lack of Transportation (Non-Medical): No  Physical Activity: Insufficiently Active (01/04/2024)   Exercise Vital Sign    Days of Exercise per Week: 2 days    Minutes of Exercise per Session: 30 min  Stress: Stress Concern Present  (01/04/2024)   [employer] of Occupational Health - Occupational Stress Questionnaire    Feeling of Stress: Rather much  Social Connections: Moderately Isolated (01/04/2024)   Social Connection and Isolation Panel    Frequency of Communication with Friends and Family: Once a week    Frequency of Social Gatherings with Friends and Family: Once a week    Attends Religious Services: 1 to 4 times per year    Active Member of [employer] or Organizations: No    Attends Engineer, structural: Not on file    Marital Status: Married    Family History  Problem Relation Age of Onset   Depression Mother    Diabetes Father    Hyperlipidemia Father    Hypertension Father    Depression Sister    Breast cancer Maternal Grandmother    Crohn's disease Maternal Grandmother    High blood pressure Paternal Grandfather    Diabetes Paternal Grandfather    Depression Son    Sleep apnea Neg Hx    Liver disease Neg Hx    Esophageal cancer Neg Hx    Stomach cancer Neg Hx     Health Maintenance  Topic Date Due   Hepatitis C Screening  Never done   Zoster Vaccines- Shingrix (1 of 2) Never done   Hepatitis B Vaccines 19-59 Average Risk (3 of 3 - 19+ 3-dose series) 11/06/2019   Lung Cancer Screening  02/23/2022   OPHTHALMOLOGY EXAM  03/03/2023   Influenza Vaccine  10/13/2023   COVID-19 Vaccine (4 - 2025-26 season) 11/13/2023   HEMOGLOBIN A1C  12/22/2023   DTaP/Tdap/Td (3 - Td or Tdap) 08/30/2024 (Originally 01/22/2023)   Diabetic kidney evaluation - Urine ACR  06/21/2024   FOOT EXAM  06/21/2024   Pneumococcal Vaccine: 50+ Years (2 of 2 - PCV) 07/11/2024   Diabetic kidney evaluation - eGFR measurement  11/22/2024   Mammogram  03/21/2025   Cervical Cancer Screening (HPV/Pap Cotest)  01/21/2027   Colonoscopy  06/26/2029   HPV VACCINES  Aged Out   Meningococcal B Vaccine  Aged Out   Fecal DNA (Cologuard)  Discontinued   HIV Screening  Discontinued      ----------------------------------------------------------------------------------------------------------------------------------------------------------------------------------------------------------------- Physical Exam BP 120/76 (BP Location: Left Arm, Patient Position: Sitting, Cuff Size: Normal)   Pulse 73   Ht 5' 6 (1.676 m)   Wt 195 lb (88.5 kg)   LMP  (LMP Unknown)   SpO2 95%   BMI 31.47 kg/m   Physical Exam Constitutional:      Appearance: Normal appearance.  Musculoskeletal:     Comments: TTP along ulnar side of wrist.  No swelling noted.  Pain with radial deviation, flexion and extension.  Pain with grip.   Neurological:  Mental Status: She is alert.  Psychiatric:        Mood and Affect: Mood normal.        Behavior: Behavior normal.     ------------------------------------------------------------------------------------------------------------------------------------------------------------------------------------------------------------------- Assessment and Plan  Left wrist pain Xrays L wrist.  ?TFCC tear  If negative xrays. Discussed MRI vs referral to Ortho/sports med.   No orders of the defined types were placed in this encounter.   No follow-ups on file.

## 2024-01-05 ENCOUNTER — Emergency Department (HOSPITAL_BASED_OUTPATIENT_CLINIC_OR_DEPARTMENT_OTHER)
Admission: EM | Admit: 2024-01-05 | Discharge: 2024-01-05 | Disposition: A | Discharge status: Home / Self Care | Attending: Emergency Medicine | Admitting: Emergency Medicine

## 2024-01-05 ENCOUNTER — Encounter (HOSPITAL_BASED_OUTPATIENT_CLINIC_OR_DEPARTMENT_OTHER): Payer: Self-pay

## 2024-01-05 ENCOUNTER — Other Ambulatory Visit: Payer: Self-pay

## 2024-01-05 DIAGNOSIS — M545 Low back pain, unspecified: Secondary | ICD-10-CM | POA: Insufficient documentation

## 2024-01-05 DIAGNOSIS — Z7984 Long term (current) use of oral hypoglycemic drugs: Secondary | ICD-10-CM | POA: Diagnosis not present

## 2024-01-05 DIAGNOSIS — E119 Type 2 diabetes mellitus without complications: Secondary | ICD-10-CM | POA: Insufficient documentation

## 2024-01-05 DIAGNOSIS — Z7982 Long term (current) use of aspirin: Secondary | ICD-10-CM | POA: Insufficient documentation

## 2024-01-05 HISTORY — DX: Calculus of kidney: N20.0

## 2024-01-05 HISTORY — DX: Disorder of kidney and ureter, unspecified: N28.9

## 2024-01-05 LAB — CBC WITH DIFFERENTIAL/PLATELET
Abs Immature Granulocytes: 0.03 K/uL (ref 0.00–0.07)
Basophils Absolute: 0.1 K/uL (ref 0.0–0.1)
Basophils Relative: 1 %
Eosinophils Absolute: 0.1 K/uL (ref 0.0–0.5)
Eosinophils Relative: 1 %
HCT: 41.3 % (ref 36.0–46.0)
Hemoglobin: 13.7 g/dL (ref 12.0–15.0)
Immature Granulocytes: 0 %
Lymphocytes Relative: 30 %
Lymphs Abs: 3.2 K/uL (ref 0.7–4.0)
MCH: 30 pg (ref 26.0–34.0)
MCHC: 33.2 g/dL (ref 30.0–36.0)
MCV: 90.6 fL (ref 80.0–100.0)
Monocytes Absolute: 0.8 K/uL (ref 0.1–1.0)
Monocytes Relative: 8 %
Neutro Abs: 6.6 K/uL (ref 1.7–7.7)
Neutrophils Relative %: 60 %
Platelets: 294 K/uL (ref 150–400)
RBC: 4.56 MIL/uL (ref 3.87–5.11)
RDW: 12.6 % (ref 11.5–15.5)
WBC: 10.8 K/uL — ABNORMAL HIGH (ref 4.0–10.5)
nRBC: 0 % (ref 0.0–0.2)

## 2024-01-05 LAB — URINALYSIS, MICROSCOPIC (REFLEX): WBC, UA: NONE SEEN WBC/hpf (ref 0–5)

## 2024-01-05 LAB — BASIC METABOLIC PANEL WITH GFR
Anion gap: 11 (ref 5–15)
BUN: 18 mg/dL (ref 6–20)
CO2: 22 mmol/L (ref 22–32)
Calcium: 8.9 mg/dL (ref 8.9–10.3)
Chloride: 107 mmol/L (ref 98–111)
Creatinine, Ser: 0.9 mg/dL (ref 0.44–1.00)
GFR, Estimated: >60 mL/min (ref >60)
Glucose, Bld: 88 mg/dL (ref 70–99)
Potassium: 4.2 mmol/L (ref 3.5–5.1)
Sodium: 140 mmol/L (ref 135–145)

## 2024-01-05 LAB — URINALYSIS, ROUTINE W REFLEX MICROSCOPIC
Bilirubin Urine: NEGATIVE
Glucose, UA: NEGATIVE mg/dL
Ketones, ur: NEGATIVE mg/dL
Leukocytes,Ua: NEGATIVE
Nitrite: NEGATIVE
Protein, ur: NEGATIVE mg/dL
Specific Gravity, Urine: <=1.005 (ref 1.005–1.030)
pH: 5.5 (ref 5.0–8.0)

## 2024-01-05 MED ORDER — NAPROXEN 500 MG PO TABS: 500.0000 mg | ORAL_TABLET | Freq: Two times a day (BID) | ORAL | 0 refills | Status: DC

## 2024-01-05 MED ORDER — HYDROCODONE-ACETAMINOPHEN 5-325 MG PO TABS: 1.0000 | ORAL_TABLET | Freq: Four times a day (QID) | ORAL | 0 refills | Status: DC | PRN

## 2024-01-05 MED ORDER — MORPHINE SULFATE (PF) 4 MG/ML IV SOLN
4.0000 mg | Freq: Once | INTRAVENOUS | Status: AC
  Administered 2024-01-05: 4 mg via INTRAVENOUS
  Filled 2024-01-05: qty 1

## 2024-01-05 MED ORDER — KETOROLAC TROMETHAMINE 15 MG/ML IJ SOLN
15.0000 mg | Freq: Once | INTRAMUSCULAR | Status: AC
  Administered 2024-01-05: 15 mg via INTRAVENOUS
  Filled 2024-01-05: qty 1

## 2024-01-05 MED ORDER — CYCLOBENZAPRINE HCL 10 MG PO TABS: 10.0000 mg | ORAL_TABLET | Freq: Two times a day (BID) | ORAL | 0 refills | Status: DC | PRN

## 2024-01-05 MED ORDER — ONDANSETRON HCL 4 MG/2ML IJ SOLN
4.0000 mg | Freq: Once | INTRAMUSCULAR | Status: AC
  Administered 2024-01-05: 4 mg via INTRAVENOUS
  Filled 2024-01-05: qty 2

## 2024-01-05 NOTE — ED Notes (Signed)
Pt ambulated to BR without difficulty

## 2024-01-05 NOTE — ED Triage Notes (Signed)
 Intermittent lower back pain x 2 days. Pt tearful during triage. Pain increased last night. Denies injury. No urinary symptoms

## 2024-01-05 NOTE — ED Provider Notes (Signed)
  EMERGENCY DEPARTMENT AT [facility] Provider Note   CSN: [id number] Arrival date & time: 01/05/24  1000     Patient presents with: Back Pain   Sarah Nicholson is a 53 y.o. female.   Patient with idiopathic microscopic hematuria, hyperlipidemia, diabetes --presents to the emergency department today for evaluation of back pain.  Patient is in the bilateral lower back.  This started 2 days ago, no injuries or incidents at onset.  She states that she had a dull ache in her lower back, bilaterally, that for the most part went away yesterday.  She had a dull ache this morning, treated with ibuprofen  at home.  She tried to go to work, pain became much worse.  She has had nausea but no vomiting.  She denies fever.  She feels like she is urinating more over the past few days as well.  She has not seen any blood in the urine.  No burning with urination.       Prior to Admission medications   Medication Sig Start Date End Date Taking? Authorizing Provider  aspirin  EC 81 MG tablet Take 1 tablet (81 mg total) by mouth daily. 01/30/23   Curtis Debby PARAS, MD  cetirizine  (ZYRTEC ) 10 MG tablet Take 20 mg by mouth daily as needed for allergies ([place]).    [provider]  clonazePAM  (KLONOPIN ) 0.5 MG tablet Take 1 tablet (0.5 mg total) by mouth 2 (two) times daily as needed for anxiety. 09/23/22   Curtis Debby PARAS, MD  enalapril  (VASOTEC ) 10 MG tablet Take 1 tablet (10 mg total) by mouth 2 (two) times daily. 11/01/23   Curtis Debby PARAS, MD  escitalopram  (LEXAPRO ) 20 MG tablet Take 2 tablets (40 mg total) by mouth daily. 08/28/23   Curtis Debby PARAS, MD  ezetimibe  (ZETIA ) 10 MG tablet Take 1 tablet (10 mg total) by mouth daily. 07/31/23   Wyn Jackee VEAR Mickey., NP  fluticasone  (FLONASE ) 50 MCG/ACT nasal spray Place 1 spray into both nostrils 2 (two) times daily as needed for rhinitis. 10/05/23 01/04/24  Ival Domino, FNP  gabapentin  (NEURONTIN ) 300 MG  capsule Take 1 capsule (300 mg total) by mouth 3 (three) times daily. 11/21/23   Alvan Dorothyann BIRCH, MD  Hyoscyamine  Sulfate SL (LEVSIN AMIEL) 0.125 MG SUBL Dissolve one tablet every 4-6 hours as needed for abdominal pain/spasms 03/18/22   Esterwood, Amy S, PA-C  metFORMIN  (GLUCOPHAGE -XR) 750 MG 24 hr tablet Take 1 tablet (750 mg total) by mouth daily with breakfast. 01/30/23   Curtis Debby PARAS, MD  ondansetron  (ZOFRAN -ODT) 8 MG disintegrating tablet Take 1 tablet (8 mg total) by mouth every 8 (eight) hours as needed for nausea. 04/14/23   Curtis Debby PARAS, MD  OZEMPIC , 0.25 OR 0.5 MG/DOSE, 2 MG/3ML [facility]  01/03/24   [provider]  rosuvastatin  (CRESTOR ) 40 MG tablet Take 1 tablet (40 mg total) by mouth daily. 06/23/23   Curtis Debby PARAS, MD    Allergies: Macrobid [nitrofurantoin], Other, Tetanus toxoid-containing vaccines, Thimerosal (thiomersal), and Thimerosal    Review of Systems  Updated Vital Signs BP (!) 171/98   Pulse (!) 59   Temp 97.8 F (36.6 C)   Resp 18   Wt 88.5 kg   LMP  (LMP Unknown)   SpO2 98%   BMI 31.47 kg/m   Physical Exam Vitals and nursing note reviewed.  Constitutional:      General: She is in acute distress.     Appearance: She is well-developed.  Comments: Patient tearful  HENT:     Head: Normocephalic and atraumatic.  Eyes:     Conjunctiva/sclera: Conjunctivae normal.  Pulmonary:     Effort: Pulmonary effort is normal.  Abdominal:     Palpations: Abdomen is soft.     Tenderness: There is no abdominal tenderness.  Musculoskeletal:        General: Normal range of motion.     Cervical back: Normal range of motion and neck supple. No bony tenderness.     Thoracic back: No bony tenderness.     Lumbar back: Tenderness present. No bony tenderness.       Back:     Comments: No step-off noted with palpation of spine.   Skin:    General: Skin is warm and dry.     Findings: No rash.  Neurological:     Mental Status: She is  alert.     Sensory: No sensory deficit.     Deep Tendon Reflexes: Reflexes are normal and symmetric.     Comments: 5/5 strength in entire lower extremities bilaterally. No sensation deficit.      (all labs ordered are listed, but only abnormal results are displayed) Labs Reviewed  URINALYSIS, ROUTINE W REFLEX MICROSCOPIC - Abnormal; Notable for the following components:      Result Value   Color, Urine STRAW (*)    Hgb urine dipstick MODERATE (*)    All other components within normal limits  CBC WITH DIFFERENTIAL/PLATELET - Abnormal; Notable for the following components:   WBC 10.8 (*)    All other components within normal limits  URINALYSIS, MICROSCOPIC (REFLEX) - Abnormal; Notable for the following components:   Bacteria, UA RARE (*)    All other components within normal limits  BASIC METABOLIC PANEL WITH GFR    EKG: None  Radiology: No results found.   Procedures   Medications Ordered in the ED  ketorolac  (TORADOL ) 15 MG/ML injection 15 mg (15 mg Intravenous Given 01/05/24 1039)  morphine (PF) 4 MG/ML injection 4 mg (4 mg Intravenous Given 01/05/24 1041)  ondansetron  (ZOFRAN ) injection 4 mg (4 mg Intravenous Given 01/05/24 1044)   ED Course  Patient seen and examined. History obtained directly from patient.   Labs/EKG: Ordered CBC, BMP, UA  Imaging: None ordered  Medications/Fluids: Ordered: IV Toradol , IV morphine, IV Zofran .   Most recent vital signs reviewed and are as follows: BP (!) 171/98   Pulse (!) 59   Temp 97.8 F (36.6 C)   Resp 18   Wt 88.5 kg   LMP  (LMP Unknown)   SpO2 98%   BMI 31.47 kg/m   Initial impression: Bilateral lower back pain, reproducible to movement and palpation.  No lower extremity symptoms.  No abdominal tenderness.  Patient concerned about possible UTI or kidney infection.  Symptoms are a bit atypical for this.  11:06 AM Reassessment performed. Patient appears more comfortable now.  She is resting, little bit drowsy.   States pain is improving.  Labs personally reviewed and interpreted including: CBC with elevated white blood cell count at 10.8, which is chronic for the patient, better than her usual baseline for which she has seen hematology; BMP pending.  UA with dipstick showing blood, 0-5 red cells under microscope and no white blood cells seen, no compelling evidence for infection at this time.  Reviewed pertinent lab work and imaging with patient at bedside. Questions answered.   Most current vital signs reviewed and are as follows: BP (!) 171/98  Pulse (!) 59   Temp 97.8 F (36.6 C)   Resp 18   Wt 88.5 kg   LMP  (LMP Unknown)   SpO2 98%   BMI 31.47 kg/m   Plan: Will reassess.  Will need to attempt ambulation trial at some point to ensure symptoms are well-controlled.  12:14 PM Reassessment performed. Patient appears stable, relatively comfortable.  She was able to ambulate to the restroom.  She is comfortable discharge to home.  Labs personally reviewed and interpreted including: BMP unremarkable  Reviewed pertinent lab work and imaging with patient at bedside. Questions answered.   Most current vital signs reviewed and are as follows: BP (!) 171/98   Pulse (!) 59   Temp 97.8 F (36.6 C)   Resp 18   Wt 88.5 kg   LMP  (LMP Unknown)   SpO2 98%   BMI 31.47 kg/m   Plan: Discharge to home.   Prescriptions written for: Hydrocodone  # 8 tablets, Flexeril, naproxen   Patient counseled on proper use of muscle relaxant and narcotic pain medication.  They were told not to drink alcohol, drive any vehicle, or do any dangerous activities while taking this medication.  Patient verbalized understanding.  Patient urged to follow-up with PCP if pain does not improve with treatment and rest or if pain becomes recurrent. Urged to return with worsening severe pain, loss of bowel or bladder control, trouble walking.   The patient verbalizes understanding and agrees with the plan.                                    Medical Decision Making Amount and/or Complexity of Data Reviewed Labs: ordered.  Risk Prescription drug management.   Patient with back pain, new problem for the patient. No neurological deficits. Patient is ambulatory. No warning symptoms of back pain including: fecal incontinence, urinary retention or overflow incontinence, night sweats, waking from sleep with back pain, unexplained fevers or weight loss, h/o cancer, IVDU, recent trauma. No concern for cauda equina, epidural abscess, or other serious cause of back pain.  Labs are reassuring.  No signs of UTI today.  Conservative measures such as rest, ice/heat and pain medicine indicated with PCP follow-up if no improvement with conservative management.       Final diagnoses:  Acute bilateral low back pain without sciatica    ED Discharge Orders          Ordered    HYDROcodone -acetaminophen  (NORCO/VICODIN) 5-325 MG tablet  Every 6 hours PRN        01/05/24 1213    cyclobenzaprine (FLEXERIL) 10 MG tablet  2 times daily PRN        01/05/24 1213    naproxen  (NAPROSYN ) 500 MG tablet  2 times daily        01/05/24 1213               Desiderio Chew, PA-C 01/05/24 1216    Patsey Lot, MD 01/05/24 1438

## 2024-01-05 NOTE — Discharge Instructions (Signed)
 Please read and follow all provided instructions.  Your diagnoses today include:  1. Acute bilateral low back pain without sciatica     Tests performed today include: Vital signs - see below for your results today Complete blood cell count: White blood cell count was minimally elevated at 10.8, normal red blood cell count Basic metabolic panel: Normal kidneys and electrolytes Urinalysis (urine test): Urine test with dipstick positive for blood, but no significant red cells or white cells noted on microscopy  Medications prescribed:  Vicodin (hydrocodone /acetaminophen ) - narcotic pain medication  DO NOT drive or perform any activities that require you to be awake and alert because this medicine can make you drowsy. BE VERY CAREFUL not to take multiple medicines containing Tylenol  (also called acetaminophen ). Doing so can lead to an overdose which can damage your liver and cause liver failure and possibly death.  Flexeril (cyclobenzaprine) - muscle relaxer medication  DO NOT drive or perform any activities that require you to be awake and alert because this medicine can make you drowsy.   Naproxen  - anti-inflammatory pain medication Do not exceed 500mg  naproxen  every 12 hours, take with food  You have been prescribed an anti-inflammatory medication or NSAID. Take with food. Take smallest effective dose for the shortest duration needed for your pain. Stop taking if you experience stomach pain or vomiting.   Take any prescribed medications only as directed.  Home care instructions:  Follow any educational materials contained in this packet Please rest, use ice or heat on your back for the next several days Do not lift, push, pull anything more than 10 pounds for the next week  Follow-up instructions: Please follow-up with your primary care provider in the next 1 week for further evaluation of your symptoms.   Return instructions:  SEEK IMMEDIATE MEDICAL ATTENTION IF YOU HAVE: New  numbness, tingling, weakness, or problem with the use of your arms or legs Severe back pain not relieved with medications Loss control of your bowels or bladder Increasing pain in any areas of the body (such as chest or abdominal pain) Shortness of breath, dizziness, or fainting.  Worsening nausea (feeling sick to your stomach), vomiting, fever, or sweats Any other emergent concerns regarding your health   Additional Information:  Your vital signs today were: BP (!) 171/98   Pulse (!) 59   Temp 97.8 F (36.6 C)   Resp 18   Wt 88.5 kg   LMP  (LMP Unknown)   SpO2 98%   BMI 31.47 kg/m  If your blood pressure (BP) was elevated above 135/85 this visit, please have this repeated by your doctor within one month. --------------

## 2024-01-05 NOTE — ED Notes (Signed)
 Discharge instructions reviewed with patient. Patient verbalizes understanding, no further questions at this time. Medications/prescriptions and follow up information provided. No acute distress noted at time of departure.

## 2024-01-08 ENCOUNTER — Ambulatory Visit: Payer: Self-pay

## 2024-01-08 NOTE — Telephone Encounter (Signed)
 FYI Only or Action Required?: FYI only for provider.  Patient was last seen in primary care on 01/04/2024 by Alvia Bring, DO.  Called Nurse Triage reporting Back Pain and Dysuria.  Symptoms began several days ago.  Interventions attempted: Prescription medications: hydrocodone .  Symptoms are: gradually worsening.  Triage Disposition: See Physician Within 24 Hours (overriding See HCP Within 4 Hours (Or PCP Triage))  Patient/caregiver understands and will follow disposition?: Yes                            Copied from CRM [id number]. Topic: Clinical - Red Word Triage >> Jan 08, 2024 10:34 AM Sarah Nicholson wrote: Red Word that prompted transfer to Nurse Triage: Severe back pain. Painful urination, fatigued.    ----------------------------------------------------------------------- From previous Reason for Contact - Scheduling: Patient/patient representative is calling to schedule an appointment. Refer to attachments for appointment information. Reason for Disposition  Side (flank) or lower back pain present  Answer Assessment - Initial Assessment Questions 1. SEVERITY: How bad is the pain?  (e.g., Scale 1-10; mild, moderate, or severe)     Rates pain a 3 2. FREQUENCY: How many times have you had painful urination today?      N/A 3. PATTERN: Is pain present every time you urinate or just sometimes?      Every time 4. ONSET: When did the painful urination start?      Saturday night 5. FEVER: Do you have a fever? If Yes, ask: What is your temperature, how was it measured, and when did it start?     Denies 6. PAST UTI: Have you had a urine infection before? If Yes, ask: When was the last time? and What happened that time?      Yes, states she used to get them frequently  7. CAUSE: What do you think is causing the painful urination?  (e.g., UTI, scratch, Herpes sore)     UTI 8. OTHER SYMPTOMS: Do you have any other symptoms? (e.g.,  blood in urine, flank pain, genital sores, urgency, vaginal discharge)     Lower back pain, dark urine, urinary frequency at baseline 9. PREGNANCY: Is there any chance you are pregnant? When was your last menstrual period?     N/A    This RN scheduled patient for first available appointment (tomorrow morning).  Answer Assessment - Initial Assessment Questions 1. ONSET: When did the pain begin? (e.g., minutes, hours, days)     Wednesday of last week, went to ED on Friday 2. LOCATION: Where does it hurt? (upper, mid or lower back)     Bilateral lower back  3. SEVERITY: How bad is the pain?  (e.g., Scale 1-10; mild, moderate, or severe)     Rates pain 6-7 at this time 4. PATTERN: Is the pain constant? (e.g., yes, no; constant, intermittent)      Constant 5. RADIATION: Does the pain shoot into your legs or somewhere else?     Denies 6. CAUSE:  What do you think is causing the back pain?      Per ED, possible muscle strain in lower back  7. BACK OVERUSE:  Any recent lifting of heavy objects, strenuous work or exercise?     N/A 8. MEDICINES: What have you taken so far for the pain? (e.g., nothing, acetaminophen , NSAIDS)     Hydrocodone  9. NEUROLOGIC SYMPTOMS: Do you have any weakness, numbness, or problems with bowel/bladder control?     Denies numbness, denies  weakness 10. OTHER SYMPTOMS: Do you have any other symptoms? (e.g., fever, abdomen pain, burning with urination, blood in urine)     Painful urination now- states this symptom was not present when she went to ED on Friday, denies fever 11. PREGNANCY: Is there any chance you are pregnant? When was your last menstrual period?     N/A  Protocols used: Back Pain-A-AH, Urination Pain - Female-A-AH

## 2024-01-09 ENCOUNTER — Ambulatory Visit: Admitting: Family Medicine

## 2024-01-09 ENCOUNTER — Encounter: Payer: Self-pay | Admitting: Family Medicine

## 2024-01-09 ENCOUNTER — Ambulatory Visit: Payer: Self-pay | Admitting: Family Medicine

## 2024-01-09 ENCOUNTER — Ambulatory Visit (INDEPENDENT_AMBULATORY_CARE_PROVIDER_SITE_OTHER)

## 2024-01-09 VITALS — BP 148/86 | HR 71 | Ht 66.0 in | Wt 194.0 lb

## 2024-01-09 DIAGNOSIS — R109 Unspecified abdominal pain: Secondary | ICD-10-CM | POA: Diagnosis not present

## 2024-01-09 DIAGNOSIS — R319 Hematuria, unspecified: Secondary | ICD-10-CM

## 2024-01-09 DIAGNOSIS — R10A2 Flank pain, left side: Secondary | ICD-10-CM

## 2024-01-09 DIAGNOSIS — R1032 Left lower quadrant pain: Secondary | ICD-10-CM

## 2024-01-09 DIAGNOSIS — R309 Painful micturition, unspecified: Secondary | ICD-10-CM

## 2024-01-09 DIAGNOSIS — R112 Nausea with vomiting, unspecified: Secondary | ICD-10-CM

## 2024-01-09 LAB — POCT URINALYSIS DIP (CLINITEK)
Bilirubin, UA: NEGATIVE
Glucose, UA: NEGATIVE mg/dL
Ketones, POC UA: NEGATIVE mg/dL
Leukocytes, UA: NEGATIVE
Nitrite, UA: NEGATIVE
POC PROTEIN,UA: NEGATIVE
Spec Grav, UA: 1.015 (ref 1.010–1.025)
Urobilinogen, UA: 0.2 U/dL
pH, UA: 7 (ref 5.0–8.0)

## 2024-01-09 MED ORDER — METHOCARBAMOL 500 MG PO TABS: 500.0000 mg | ORAL_TABLET | Freq: Three times a day (TID) | ORAL | 0 refills | Status: AC

## 2024-01-09 MED ORDER — HYDROCODONE-ACETAMINOPHEN 5-325 MG PO TABS: 1.0000 | ORAL_TABLET | Freq: Four times a day (QID) | ORAL | 0 refills | Status: DC | PRN

## 2024-01-09 NOTE — Assessment & Plan Note (Signed)
 Continued pain with hematuria.  CT abdomen/pelvis ordered for evaluation of stone.  Renewal of norco sent in.  Change flexeril to robaxin.  Red flags reviewed.

## 2024-01-09 NOTE — Progress Notes (Signed)
 Sarah Nicholson - 53 y.o. female MRN [id number]  Date of birth: November 21, 1970  Subjective Chief Complaint  Patient presents with   Dysuria    HPI Sarah Nicholson is a 53 y.o. female here today with complaint of back pain.  Initially started a few days ago.  Seen in ED and told she had back spasms.  Treated with norco, flexeril and flexeril.  She developed burning with urination over the weekend.  She reports that she continues to have pain in the L lower back and flank area and urinary pain.  Denies urgency or frequency. No radiation into groin.  She has not had nausea or vomiting.  She feels that her bowels are moving normally but she has had some mild stool incontinence.  Denies fever or chills.   ROS:  A comprehensive ROS was completed and negative except as noted per HPI  Allergies  Allergen Reactions   Macrobid [Nitrofurantoin] Hives and Rash   Other Hives    Preservative used in vaccinations   Tetanus Toxoid-Containing Vaccines Swelling    Pt states she was told the Tdap injection would have to be administered in the hospital  After discussion on 11/10 pt remembers being told it was thimersol in the tetanus 30+years ago that caused the swelling.     Thimerosal (Thiomersal) Rash   Thimerosal Rash    Past Medical History:  Diagnosis Date   ADD (attention deficit disorder) 06/14/2012   Anxiety    Bronchitis    Complication of anesthesia 1987   states woke up during appedentomy   Depression 06/14/2012   Hepatic steatosis 11/13/2018   Hepatic steatosis with hepatomegaly seen on MRI abd on 11/2018   History of leukocytosis 06/14/2012   Reports WBC 32 decades ago with negative hematology specialist workup.    Hyperlipidemia 06/14/2012   Hypertension    Kidney stones    Renal disorder    Seasonal allergies 06/14/2012    Past Surgical History:  Procedure Laterality Date   APPENDECTOMY     BICEPT TENODESIS Right 10/10/2019   Procedure: BICEPS TENODESIS;   Surgeon: Cristy Bonner DASEN, MD;  Location: [facility];  Service: Orthopedics;  Laterality: Right;   bladder sling/mesh     INGUINAL HERNIA REPAIR     intrauterine ablation     LEFT HEART CATH AND CORONARY ANGIOGRAPHY N/A 12/09/2021   Procedure: LEFT HEART CATH AND CORONARY ANGIOGRAPHY;  Surgeon: Claudene Victory ORN, MD;  Location: [facility];  Service: Cardiovascular;  Laterality: N/A;   NASAL SINUS SURGERY     TUBAL LIGATION     VISCERAL ANGIOGRAPHY N/A 02/04/2022   Procedure: VISCERAL ANGIOGRAPHY/Mesentric;  Surgeon: Gretta Lonni PARAS, MD;  Location: [facility];  Service: Cardiovascular;  Laterality: N/A;    Social History   Socioeconomic History   Marital status: Married    Spouse name: Not on file   Number of children: 3   Years of education: Not on file   Highest education level: Bachelor's degree (e.g., BA, AB, BS)  Occupational History   Occupation: [employer]  Tobacco Use   Smoking status: Every Day    Current packs/day: 1.00    Types: Cigarettes   Smokeless tobacco: Never  Vaping Use   Vaping status: Never Used  Substance and Sexual Activity   Alcohol use: Not Currently   Drug use: No   Sexual activity: Yes    Birth control/protection: Post-menopausal  Other Topics Concern   Not on  file  Social History Narrative   Not on file   Social Drivers of Health   Financial Resource Strain: Low Risk  (01/04/2024)   Overall Financial Resource Strain (CARDIA)    Difficulty of Paying Living Expenses: Not hard at all  Food Insecurity: No Food Insecurity (01/04/2024)   Hunger Vital Sign    Worried About Running Out of Food in the Last Year: Never true    Ran Out of Food in the Last Year: Never true  Transportation Needs: No Transportation Needs (01/04/2024)   PRAPARE - Administrator, Civil Service (Medical): No    Lack of Transportation (Non-Medical): No  Physical Activity: Insufficiently Active (01/04/2024)   Exercise Vital Sign     Days of Exercise per Week: 2 days    Minutes of Exercise per Session: 30 min  Stress: Stress Concern Present (01/04/2024)   [employer] of Occupational Health - Occupational Stress Questionnaire    Feeling of Stress: Rather much  Social Connections: Moderately Isolated (01/04/2024)   Social Connection and Isolation Panel    Frequency of Communication with Friends and Family: Once a week    Frequency of Social Gatherings with Friends and Family: Once a week    Attends Religious Services: 1 to 4 times per year    Active Member of Clubs or Organizations: No    Attends Engineer, Structural: Not on file    Marital Status: Married    Family History  Problem Relation Age of Onset   Depression Mother    Diabetes Father    Hyperlipidemia Father    Hypertension Father    Depression Sister    Breast cancer Maternal Grandmother    Crohn's disease Maternal Grandmother    High blood pressure Paternal Grandfather    Diabetes Paternal Grandfather    Depression Son    Sleep apnea Neg Hx    Liver disease Neg Hx    Esophageal cancer Neg Hx    Stomach cancer Neg Hx     Health Maintenance  Topic Date Due   Hepatitis B Vaccines 19-59 Average Risk (3 of 3 - 19+ 3-dose series) 11/06/2019   Lung Cancer Screening  02/23/2022   OPHTHALMOLOGY EXAM  03/03/2023   HEMOGLOBIN A1C  12/22/2023   Zoster Vaccines- Shingrix (1 of 2) 04/10/2024 (Originally 01/01/1988)   Influenza Vaccine  06/11/2024 (Originally 10/13/2023)   DTaP/Tdap/Td (3 - Td or Tdap) 08/30/2024 (Originally 01/22/2023)   Hepatitis C Screening  01/08/2025 (Originally 01/01/1987)   COVID-19 Vaccine (4 - 2025-26 season) 01/24/2025 (Originally 11/13/2023)   Diabetic kidney evaluation - Urine ACR  06/21/2024   FOOT EXAM  06/21/2024   Pneumococcal Vaccine: 50+ Years (2 of 2 - PCV) 07/11/2024   Diabetic kidney evaluation - eGFR measurement  01/04/2025   Mammogram  03/21/2025   Cervical Cancer Screening (HPV/Pap Cotest)  01/21/2027    Colonoscopy  06/26/2029   HPV VACCINES  Aged Out   Meningococcal B Vaccine  Aged Out   Fecal DNA (Cologuard)  Discontinued   HIV Screening  Discontinued     ----------------------------------------------------------------------------------------------------------------------------------------------------------------------------------------------------------------- Physical Exam BP (!) 168/81 (BP Location: Left Arm, Patient Position: Sitting, Cuff Size: Normal)   Pulse 71   Ht 5' 6 (1.676 m)   Wt 194 lb (88 kg)   LMP  (LMP Unknown)   SpO2 92%   BMI 31.31 kg/m   Physical Exam Constitutional:      Appearance: Normal appearance.  Cardiovascular:     Rate and Rhythm:  Normal rate and regular rhythm.  Pulmonary:     Effort: Pulmonary effort is normal.     Breath sounds: Normal breath sounds.  Abdominal:     Tenderness: There is abdominal tenderness (LLQ and L flank and CVA tenderness to percussion.).  Neurological:     Mental Status: She is alert.     ------------------------------------------------------------------------------------------------------------------------------------------------------------------------------------------------------------------- Assessment and Plan  Left lateral abdominal pain Continued pain with hematuria.  CT abdomen/pelvis ordered for evaluation of stone.  Renewal of norco sent in.  Change flexeril to robaxin.  Red flags reviewed.    Meds ordered this encounter  Medications   HYDROcodone -acetaminophen  (NORCO/VICODIN) 5-325 MG tablet    Sig: Take 1 tablet by mouth every 6 (six) hours as needed for severe pain (pain score 7-10).    Dispense:  10 tablet    Refill:  0   methocarbamol (ROBAXIN) 500 MG tablet    Sig: Take 1 tablet (500 mg total) by mouth 3 (three) times daily.    Dispense:  45 tablet    Refill:  0    No follow-ups on file.

## 2024-01-12 LAB — URINE CULTURE

## 2024-02-06 ENCOUNTER — Telehealth (HOSPITAL_BASED_OUTPATIENT_CLINIC_OR_DEPARTMENT_OTHER): Payer: Self-pay | Admitting: Family Medicine

## 2024-02-06 NOTE — Telephone Encounter (Signed)
 Pt is not a pt of [facility].

## 2024-02-06 NOTE — Telephone Encounter (Unsigned)
 Copied from CRM [id number]. Topic: Clinical - Medication Question >> Feb 06, 2024  9:25 AM Tiffini S wrote: Reason for CRM: Levorn with [facility] [phone] called about the gabapentin  (NEURONTIN ) 300 MG capsule- needs clarification about the prescription instructions for a dosage increase.   Please call the pharmacy to discuss/ follow up.

## 2024-02-07 ENCOUNTER — Telehealth: Payer: Self-pay

## 2024-02-07 NOTE — Telephone Encounter (Signed)
 Unfortunately am cutting back my clinic hours and unable to take new patients right now.

## 2024-02-07 NOTE — Telephone Encounter (Signed)
 Copied from CRM [id number]. Topic: Appointments - Scheduling Inquiry for Clinic >> Feb 07, 2024 11:51 AM Ivette P wrote: Reason for CRM: Pt was a pt of Dr. ONEIDA and would like ot be a pt of Dr. Alvan. Pls advise if pt can be approved.

## 2024-02-07 NOTE — Telephone Encounter (Signed)
 Patient is asking to become a new patient

## 2024-02-19 ENCOUNTER — Ambulatory Visit: Admitting: Urgent Care

## 2024-02-19 VITALS — BP 134/84 | HR 63 | Ht 66.0 in | Wt 196.0 lb

## 2024-02-19 DIAGNOSIS — F410 Panic disorder [episodic paroxysmal anxiety] without agoraphobia: Secondary | ICD-10-CM

## 2024-02-19 DIAGNOSIS — M5410 Radiculopathy, site unspecified: Secondary | ICD-10-CM

## 2024-02-19 DIAGNOSIS — I10 Essential (primary) hypertension: Secondary | ICD-10-CM

## 2024-02-19 DIAGNOSIS — Z7984 Long term (current) use of oral hypoglycemic drugs: Secondary | ICD-10-CM

## 2024-02-19 DIAGNOSIS — E119 Type 2 diabetes mellitus without complications: Secondary | ICD-10-CM

## 2024-02-19 DIAGNOSIS — E782 Mixed hyperlipidemia: Secondary | ICD-10-CM

## 2024-02-19 MED ORDER — SEMAGLUTIDE (1 MG/DOSE) 4 MG/3ML ~~LOC~~ SOPN: 1.0000 mg | PEN_INJECTOR | SUBCUTANEOUS | 5 refills | Status: AC

## 2024-02-19 MED ORDER — CLONAZEPAM 0.5 MG PO TABS: 0.5000 mg | ORAL_TABLET | Freq: Two times a day (BID) | ORAL | 3 refills | Status: AC | PRN

## 2024-02-19 MED ORDER — GABAPENTIN 300 MG PO CAPS: ORAL_CAPSULE | ORAL | 1 refills | Status: AC

## 2024-02-19 NOTE — Progress Notes (Unsigned)
   Established Patient Office Visit  Subjective:  Patient ID: Sarah Nicholson, female    DOB: 09-23-1970  Age: 53 y.o. MRN: [id number]  Chief Complaint  Patient presents with   transfer of care    Change gabapentin  rx; possibly increase dose of ozempic     HPI  {History (Optional):23778}  ROS: as noted in HPI  Objective:     BP 134/84   Pulse 63   Ht 5' 6 (1.676 m)   Wt 196 lb (88.9 kg)   LMP  (LMP Unknown)   SpO2 95%   BMI 31.64 kg/m  BP Readings from Last 3 Encounters:  02/19/24 134/84  01/09/24 (!) 148/86  01/05/24 132/86   Wt Readings from Last 3 Encounters:  02/19/24 196 lb (88.9 kg)  01/09/24 194 lb (88 kg)  01/05/24 195 lb (88.5 kg)      Physical Exam   No results found for any visits on 02/19/24.  {Labs (Optional):23779}  The 10-year ASCVD risk score (Arnett DK, et al., 2019) is: 21.8%  Assessment & Plan:  There are no diagnoses linked to this encounter.   No follow-ups on file.   Benton LITTIE Gave, PA

## 2024-02-20 ENCOUNTER — Encounter: Payer: Self-pay | Admitting: Urgent Care

## 2024-02-20 NOTE — Patient Instructions (Signed)
 Continue all medications as previously ordered, other than Ozempic  in which we will increase to 1mg  weekly. Monitor for side effects.  Monitor BP at home with goal readings 120/80. Continue to take cholesterol medication nightly. Come back Fasting for labs in April. Will complete annual physical at that time

## 2024-02-28 ENCOUNTER — Other Ambulatory Visit: Payer: Self-pay

## 2024-02-28 ENCOUNTER — Ambulatory Visit: Admitting: Medical Oncology

## 2024-02-28 ENCOUNTER — Ambulatory Visit: Payer: Self-pay | Admitting: Medical Oncology

## 2024-02-28 ENCOUNTER — Encounter: Payer: Self-pay | Admitting: Medical Oncology

## 2024-02-28 ENCOUNTER — Inpatient Hospital Stay: Attending: Hematology & Oncology

## 2024-02-28 VITALS — BP 148/72 | HR 64 | Temp 98.6°F | Resp 16 | Ht 66.0 in | Wt 195.0 lb

## 2024-02-28 DIAGNOSIS — Z79899 Other long term (current) drug therapy: Secondary | ICD-10-CM | POA: Diagnosis not present

## 2024-02-28 DIAGNOSIS — D72823 Leukemoid reaction: Secondary | ICD-10-CM | POA: Insufficient documentation

## 2024-02-28 DIAGNOSIS — Z7982 Long term (current) use of aspirin: Secondary | ICD-10-CM | POA: Insufficient documentation

## 2024-02-28 LAB — CBC WITH DIFFERENTIAL (CANCER CENTER ONLY)
Abs Immature Granulocytes: 0.03 K/uL (ref 0.00–0.07)
Basophils Absolute: 0.1 K/uL (ref 0.0–0.1)
Basophils Relative: 1 %
Eosinophils Absolute: 0.1 K/uL (ref 0.0–0.5)
Eosinophils Relative: 1 %
HCT: 44.5 % (ref 36.0–46.0)
Hemoglobin: 14.9 g/dL (ref 12.0–15.0)
Immature Granulocytes: 0 %
Lymphocytes Relative: 33 %
Lymphs Abs: 3.4 K/uL (ref 0.7–4.0)
MCH: 30.3 pg (ref 26.0–34.0)
MCHC: 33.5 g/dL (ref 30.0–36.0)
MCV: 90.6 fL (ref 80.0–100.0)
Monocytes Absolute: 0.8 K/uL (ref 0.1–1.0)
Monocytes Relative: 8 %
Neutro Abs: 5.8 K/uL (ref 1.7–7.7)
Neutrophils Relative %: 57 %
Platelet Count: 293 K/uL (ref 150–400)
RBC: 4.91 MIL/uL (ref 3.87–5.11)
RDW: 12.6 % (ref 11.5–15.5)
WBC Count: 10.3 K/uL (ref 4.0–10.5)
nRBC: 0 % (ref 0.0–0.2)

## 2024-02-28 LAB — CMP (CANCER CENTER ONLY)
ALT: 11 U/L (ref 0–44)
AST: 14 U/L — ABNORMAL LOW (ref 15–41)
Albumin: 4.1 g/dL (ref 3.5–5.0)
Alkaline Phosphatase: 99 U/L (ref 38–126)
Anion gap: 10 (ref 5–15)
BUN: 14 mg/dL (ref 6–20)
CO2: 26 mmol/L (ref 22–32)
Calcium: 9.1 mg/dL (ref 8.9–10.3)
Chloride: 106 mmol/L (ref 98–111)
Creatinine: 0.96 mg/dL (ref 0.44–1.00)
GFR, Estimated: >60 mL/min (ref >60)
Glucose, Bld: 99 mg/dL (ref 70–99)
Potassium: 4.1 mmol/L (ref 3.5–5.1)
Sodium: 142 mmol/L (ref 135–145)
Total Bilirubin: 0.3 mg/dL (ref 0.0–1.2)
Total Protein: 6.7 g/dL (ref 6.5–8.1)

## 2024-02-28 LAB — LACTATE DEHYDROGENASE: LDH: 168 U/L (ref 105–235)

## 2024-02-28 NOTE — Progress Notes (Signed)
 Hematology and Oncology Follow Up Visit  Sarah Nicholson [id number] 05/16/1970 53 y.o. 02/28/2024   Principle Diagnosis:  Leukocytosis-leukemoid reaction  Current Therapy:   Observation     Interim History:  [name] is back for follow-up.    Today she states that she has been well.   She has had no problem with infections.  She has had no rashes.  She has had no change in bowel or bladder habits.  She has had no cough or shortness of breath. No unintentional weight loss (on a weight loss plan).   Night sweats are well controlled  She has also noticed increased headaches and visual changes. She has seen UC and a retinal specialist who stated that she needed new glasses.   Mammogram- January 2025  She has had no issues with bleeding.    No recent immunizations, steroid exposures, etc.   No signs of infections.   She does not smoke.   Overall, I would say that her performance status is ECOG 0.   Wt Readings from Last 3 Encounters:  02/28/24 195 lb (88.5 kg)  02/19/24 196 lb (88.9 kg)  01/09/24 194 lb (88 kg)    Medications:  Current Outpatient Medications:    aspirin  EC 81 MG tablet, Take 1 tablet (81 mg total) by mouth daily., Disp: 90 tablet, Rfl: 3   cetirizine  (ZYRTEC ) 10 MG tablet, Take 20 mg by mouth daily as needed for allergies ([place])., Disp: , Rfl:    clonazePAM  (KLONOPIN ) 0.5 MG tablet, Take 1 tablet (0.5 mg total) by mouth 2 (two) times daily as needed for anxiety., Disp: 30 tablet, Rfl: 3   enalapril  (VASOTEC ) 10 MG tablet, Take 1 tablet (10 mg total) by mouth 2 (two) times daily., Disp: 90 tablet, Rfl: 3   escitalopram  (LEXAPRO ) 20 MG tablet, Take 2 tablets (40 mg total) by mouth daily., Disp: 180 tablet, Rfl: 1   ezetimibe  (ZETIA ) 10 MG tablet, Take 1 tablet (10 mg total) by mouth daily., Disp: 90 tablet, Rfl: 3   fluticasone  (FLONASE ) 50 MCG/ACT nasal spray, Place 1 spray into both nostrils 2 (two) times daily as needed for rhinitis.,  Disp: 17 mL, Rfl: 0   gabapentin  (NEURONTIN ) 300 MG capsule, Take 3 caps (900mg ) at night and 1 cap (300mg ) in the morning, Disp: 360 capsule, Rfl: 1   Hyoscyamine  Sulfate SL (LEVSIN /SL) 0.125 MG SUBL, Dissolve one tablet every 4-6 hours as needed for abdominal pain/spasms, Disp: 30 tablet, Rfl: 4   metFORMIN  (GLUCOPHAGE -XR) 750 MG 24 hr tablet, Take 1 tablet (750 mg total) by mouth daily with breakfast., Disp: 30 tablet, Rfl: 11   methocarbamol  (ROBAXIN ) 500 MG tablet, Take 1 tablet (500 mg total) by mouth 3 (three) times daily., Disp: 45 tablet, Rfl: 0   naproxen  (NAPROSYN ) 500 MG tablet, Take 1 tablet (500 mg total) by mouth 2 (two) times daily., Disp: 20 tablet, Rfl: 0   ondansetron  (ZOFRAN -ODT) 8 MG disintegrating tablet, Take 1 tablet (8 mg total) by mouth every 8 (eight) hours as needed for nausea., Disp: 20 tablet, Rfl: 3   rosuvastatin  (CRESTOR ) 40 MG tablet, Take 1 tablet (40 mg total) by mouth daily., Disp: 90 tablet, Rfl: 3   Semaglutide , 1 MG/DOSE, 4 MG/3ML SOPN, Inject 1 mg as directed once a week., Disp: 3 mL, Rfl: 5  Allergies:  Allergies  Allergen Reactions   Macrobid [Nitrofurantoin] Hives and Rash   Other Hives    Preservative used in vaccinations   Tetanus  Toxoid-Containing Vaccines Swelling    Pt states she was told the Tdap injection would have to be administered in the hospital  After discussion on 11/10 pt remembers being told it was thimersol in the tetanus 30+years ago that caused the swelling.     Thimerosal (Thiomersal) Rash   Thimerosal Rash    Past Medical History, Surgical history, Social history, and Family History were reviewed and updated.  Review of Systems: Review of Systems  Constitutional: Negative.   HENT:  Negative.    Eyes: Negative.   Respiratory: Negative.    Cardiovascular: Negative.   Gastrointestinal: Negative.   Endocrine: Negative.   Genitourinary: Negative.    Musculoskeletal: Negative.   Skin: Negative.   Neurological: Negative.    Hematological: Negative.   Psychiatric/Behavioral: Negative.      Physical Exam:  height is 5' 6 (1.676 m) and weight is 195 lb (88.5 kg). Her oral temperature is 98.6 F (37 C). Her blood pressure is 148/72 (abnormal) and her pulse is 64. Her respiration is 16 and oxygen saturation is 98%.   Wt Readings from Last 3 Encounters:  02/28/24 195 lb (88.5 kg)  02/19/24 196 lb (88.9 kg)  01/09/24 194 lb (88 kg)    Physical Exam Vitals reviewed.  HENT:     Head: Normocephalic and atraumatic.  Eyes:     Pupils: Pupils are equal, round, and reactive to light.  Cardiovascular:     Rate and Rhythm: Normal rate and regular rhythm.     Heart sounds: Normal heart sounds.  Pulmonary:     Effort: Pulmonary effort is normal.     Breath sounds: Normal breath sounds.  Abdominal:     General: Bowel sounds are normal.     Palpations: Abdomen is soft.  Musculoskeletal:        General: No tenderness or deformity. Normal range of motion.     Cervical back: Normal range of motion.  Lymphadenopathy:     Cervical: No cervical adenopathy.  Skin:    General: Skin is warm and dry.     Findings: No erythema or rash.  Neurological:     Mental Status: She is alert and oriented to person, place, and time.  Psychiatric:        Behavior: Behavior normal.        Thought Content: Thought content normal.        Judgment: Judgment normal.      Lab Results  Component Value Date   WBC 10.3 02/28/2024   HGB 14.9 02/28/2024   HCT 44.5 02/28/2024   MCV 90.6 02/28/2024   PLT 293 02/28/2024     Chemistry      Component Value Date/Time   NA 140 01/05/2024 1026   NA 139 06/22/2023 1436   K 4.2 01/05/2024 1026   CL 107 01/05/2024 1026   CO2 22 01/05/2024 1026   BUN 18 01/05/2024 1026   BUN 11 06/22/2023 1436   CREATININE 0.90 01/05/2024 1026   CREATININE 1.00 11/23/2023 0916   CREATININE 1.09 (H) 11/03/2021 0000   GLU 78 02/29/2012 0000      Component Value Date/Time   CALCIUM  8.9 01/05/2024  1026   ALKPHOS 90 11/23/2023 0916   AST 14 (L) 11/23/2023 0916   ALT 13 11/23/2023 0916   BILITOT 0.2 11/23/2023 0916     Encounter Diagnosis  Name Primary?   Leukemoid reaction Yes     Impression and Plan: [name] is a very charming 53 y.o. white  female.  She has mild leukocytosis secondary to leukemoid reaction confirmed on blood smear. BCR-ABL normal on 08/24/2023  Initially her WBC count was trending downward which was reassuring. Unfortunately her counts then began to trend upwards. At her l06/02/2024 visit her WBC was 13.5 with ANC of 8.8 and new immature granulocytes of 0.18.   Today her WBC is normal! Fantastic news. This is very reassuring. We will continue to monitor every 6 months and if labs/symptoms were to worsen I would recommend a bone marrow biopsy .   She reports being UTD on mammograms and colon cancer screenings.   Disposition:  RTC 6 months APP, labs ( CBC w/, smear, L[facility])-[place]   Sarah CHRISTELLA Dais, PA-C 12/17/20259:31 AM

## 2024-03-11 ENCOUNTER — Telehealth: Payer: Self-pay

## 2024-03-11 NOTE — Telephone Encounter (Signed)
 Copied from CRM [id number]. Topic: Clinical - Medical Advice >> Mar 11, 2024 10:57 AM Sarah Nicholson wrote: Patient states that she thinks she has the Flu. She said she started feeling chills, and low grade fever this morning and would like her provider to send in a rx for Tamiflu .

## 2024-03-11 NOTE — Telephone Encounter (Signed)
 Spoke with patient's daughter Vernell. Home flu and covid test  results are negative.  She states her mother states she feels like she is going to die  I told her to take her mom to urgent care of ER that patient does need to be evaluated by someone if she feels that poorly.  She is agreeable to this.

## 2024-03-11 NOTE — Telephone Encounter (Signed)
 Yes, have her do home flu test and let us  now

## 2024-03-11 NOTE — Telephone Encounter (Signed)
 Home flu test results show invalid - she will do another test and will let us  know the results of this testing.- will send a picture of this through Mychart once it results.

## 2024-04-12 ENCOUNTER — Other Ambulatory Visit: Payer: Self-pay

## 2024-04-12 DIAGNOSIS — E782 Mixed hyperlipidemia: Secondary | ICD-10-CM

## 2024-04-12 MED ORDER — ROSUVASTATIN CALCIUM 40 MG PO TABS: 40.0000 mg | ORAL_TABLET | Freq: Every day | ORAL | 3 refills | Status: AC

## 2024-04-16 ENCOUNTER — Ambulatory Visit: Admitting: Urgent Care

## 2024-04-16 ENCOUNTER — Encounter: Payer: Self-pay | Admitting: Urgent Care

## 2024-04-16 ENCOUNTER — Ambulatory Visit: Payer: Self-pay | Admitting: Urgent Care

## 2024-04-16 ENCOUNTER — Ambulatory Visit

## 2024-04-16 VITALS — BP 132/86 | HR 65 | Ht 66.0 in | Wt 196.0 lb

## 2024-04-16 DIAGNOSIS — S46212A Strain of muscle, fascia and tendon of other parts of biceps, left arm, initial encounter: Secondary | ICD-10-CM

## 2024-04-16 DIAGNOSIS — Y990 Civilian activity done for income or pay: Secondary | ICD-10-CM | POA: Diagnosis not present

## 2024-04-16 DIAGNOSIS — S161XXA Strain of muscle, fascia and tendon at neck level, initial encounter: Secondary | ICD-10-CM | POA: Diagnosis not present

## 2024-04-16 DIAGNOSIS — S59902A Unspecified injury of left elbow, initial encounter: Secondary | ICD-10-CM

## 2024-04-16 DIAGNOSIS — M25552 Pain in left hip: Secondary | ICD-10-CM | POA: Diagnosis not present

## 2024-04-16 DIAGNOSIS — W19XXXA Unspecified fall, initial encounter: Secondary | ICD-10-CM | POA: Diagnosis not present

## 2024-04-16 MED ORDER — DICLOFENAC SODIUM 75 MG PO TBEC: 75.0000 mg | DELAYED_RELEASE_TABLET | Freq: Two times a day (BID) | ORAL | 0 refills | Status: AC

## 2024-04-16 MED ORDER — TIZANIDINE HCL 4 MG PO TABS: 4.0000 mg | ORAL_TABLET | Freq: Four times a day (QID) | ORAL | 0 refills | Status: AC | PRN

## 2024-04-16 NOTE — Progress Notes (Unsigned)
 Sarah Nicholson

## 2024-04-16 NOTE — Patient Instructions (Addendum)
 Take the muscle relaxer three to four times daily as needed. Keep in mind it may make you feel tired or drowsy, so do not operate machinery or drive a car until you know how it affects you.  Please take the anti-inflammatory medication called in today twice daily with food. Do not take any additional OTC NSAIDS (advil , motrin , ibuprofen , aleve , naproxen ).    Go to [address] for xray imaging.  Apply ice to the hip and elbow. Refrain from excessive range of motion/ movement for the next three days.  Follow up if any new/ worsening symptoms.  Keep your April appointment with me

## 2024-06-21 ENCOUNTER — Encounter: Admitting: Urgent Care

## 2024-07-25 ENCOUNTER — Ambulatory Visit: Admitting: Family Medicine

## 2024-07-29 ENCOUNTER — Ambulatory Visit: Admitting: Neurology

## 2024-08-28 ENCOUNTER — Inpatient Hospital Stay

## 2024-08-28 ENCOUNTER — Inpatient Hospital Stay: Admitting: Medical Oncology
